# Patient Record
Sex: Female | Born: 1965 | Race: Black or African American | Hispanic: Yes | Marital: Married | Smoking: Former smoker
Health system: Southern US, Community
[De-identification: ages and names within clinical notes are randomized; demographics above are authoritative.]

## PROBLEM LIST (undated history)

## (undated) DIAGNOSIS — K589 Irritable bowel syndrome without diarrhea: Secondary | ICD-10-CM

## (undated) DIAGNOSIS — T753XXA Motion sickness, initial encounter: Secondary | ICD-10-CM

## (undated) DIAGNOSIS — M069 Rheumatoid arthritis, unspecified: Secondary | ICD-10-CM

## (undated) DIAGNOSIS — R011 Cardiac murmur, unspecified: Secondary | ICD-10-CM

## (undated) DIAGNOSIS — F329 Major depressive disorder, single episode, unspecified: Secondary | ICD-10-CM

## (undated) DIAGNOSIS — E669 Obesity, unspecified: Secondary | ICD-10-CM

## (undated) DIAGNOSIS — M5430 Sciatica, unspecified side: Secondary | ICD-10-CM

## (undated) DIAGNOSIS — G4700 Insomnia, unspecified: Secondary | ICD-10-CM

## (undated) DIAGNOSIS — T7840XA Allergy, unspecified, initial encounter: Secondary | ICD-10-CM

## (undated) DIAGNOSIS — I10 Essential (primary) hypertension: Secondary | ICD-10-CM

## (undated) DIAGNOSIS — E119 Type 2 diabetes mellitus without complications: Secondary | ICD-10-CM

## (undated) DIAGNOSIS — F319 Bipolar disorder, unspecified: Secondary | ICD-10-CM

## (undated) DIAGNOSIS — K759 Inflammatory liver disease, unspecified: Secondary | ICD-10-CM

## (undated) DIAGNOSIS — K219 Gastro-esophageal reflux disease without esophagitis: Secondary | ICD-10-CM

## (undated) DIAGNOSIS — K5790 Diverticulosis of intestine, part unspecified, without perforation or abscess without bleeding: Secondary | ICD-10-CM

## (undated) DIAGNOSIS — E785 Hyperlipidemia, unspecified: Secondary | ICD-10-CM

## (undated) DIAGNOSIS — F32A Depression, unspecified: Secondary | ICD-10-CM

## (undated) HISTORY — PX: BREAST BIOPSY: SHX20

## (undated) HISTORY — PX: EYE SURGERY: SHX253

## (undated) HISTORY — PX: COLON SURGERY: SHX602

## (undated) HISTORY — DX: Hyperlipidemia, unspecified: E78.5

## (undated) HISTORY — PX: TUBAL LIGATION: SHX77

## (undated) HISTORY — DX: Sciatica, unspecified side: M54.30

## (undated) HISTORY — DX: Allergy, unspecified, initial encounter: T78.40XA

## (undated) HISTORY — DX: Essential (primary) hypertension: I10

## (undated) HISTORY — PX: BOWEL RESECTION: SHX1257

## (undated) HISTORY — PX: ESOPHAGOGASTRODUODENOSCOPY (EGD) WITH ESOPHAGEAL DILATION: SHX5812

## (undated) HISTORY — DX: Diverticulosis of intestine, part unspecified, without perforation or abscess without bleeding: K57.90

## (undated) HISTORY — DX: Obesity, unspecified: E66.9

---

## 1995-12-10 HISTORY — PX: CHOLECYSTECTOMY: SHX55

## 2015-01-11 DIAGNOSIS — F17210 Nicotine dependence, cigarettes, uncomplicated: Secondary | ICD-10-CM | POA: Diagnosis not present

## 2015-01-11 DIAGNOSIS — M064 Inflammatory polyarthropathy: Secondary | ICD-10-CM | POA: Diagnosis not present

## 2015-01-11 DIAGNOSIS — Z79899 Other long term (current) drug therapy: Secondary | ICD-10-CM | POA: Diagnosis not present

## 2015-01-20 DIAGNOSIS — F250 Schizoaffective disorder, bipolar type: Secondary | ICD-10-CM | POA: Diagnosis not present

## 2015-02-09 DIAGNOSIS — K589 Irritable bowel syndrome without diarrhea: Secondary | ICD-10-CM | POA: Diagnosis not present

## 2015-02-09 DIAGNOSIS — K2980 Duodenitis without bleeding: Secondary | ICD-10-CM | POA: Diagnosis not present

## 2015-02-09 DIAGNOSIS — R1013 Epigastric pain: Secondary | ICD-10-CM | POA: Diagnosis not present

## 2015-02-09 DIAGNOSIS — K449 Diaphragmatic hernia without obstruction or gangrene: Secondary | ICD-10-CM | POA: Diagnosis not present

## 2015-02-09 DIAGNOSIS — K2970 Gastritis, unspecified, without bleeding: Secondary | ICD-10-CM | POA: Diagnosis not present

## 2015-02-09 DIAGNOSIS — K5909 Other constipation: Secondary | ICD-10-CM | POA: Diagnosis not present

## 2015-02-16 DIAGNOSIS — F250 Schizoaffective disorder, bipolar type: Secondary | ICD-10-CM | POA: Diagnosis not present

## 2015-02-27 DIAGNOSIS — F250 Schizoaffective disorder, bipolar type: Secondary | ICD-10-CM | POA: Diagnosis not present

## 2015-03-09 DIAGNOSIS — F250 Schizoaffective disorder, bipolar type: Secondary | ICD-10-CM | POA: Diagnosis not present

## 2015-03-17 DIAGNOSIS — K2970 Gastritis, unspecified, without bleeding: Secondary | ICD-10-CM | POA: Diagnosis not present

## 2015-03-17 DIAGNOSIS — K5909 Other constipation: Secondary | ICD-10-CM | POA: Diagnosis not present

## 2015-04-06 DIAGNOSIS — F250 Schizoaffective disorder, bipolar type: Secondary | ICD-10-CM | POA: Diagnosis not present

## 2015-05-07 ENCOUNTER — Encounter: Payer: Self-pay | Admitting: Emergency Medicine

## 2015-05-07 ENCOUNTER — Emergency Department: Payer: Medicare Other

## 2015-05-07 ENCOUNTER — Inpatient Hospital Stay
Admission: EM | Admit: 2015-05-07 | Discharge: 2015-05-10 | DRG: 392 | Disposition: A | Discharge status: Home / Self Care | Payer: Medicare Other | Attending: Surgery | Admitting: Surgery

## 2015-05-07 DIAGNOSIS — R109 Unspecified abdominal pain: Secondary | ICD-10-CM | POA: Diagnosis not present

## 2015-05-07 DIAGNOSIS — Z79899 Other long term (current) drug therapy: Secondary | ICD-10-CM

## 2015-05-07 DIAGNOSIS — K589 Irritable bowel syndrome without diarrhea: Secondary | ICD-10-CM | POA: Diagnosis present

## 2015-05-07 DIAGNOSIS — M069 Rheumatoid arthritis, unspecified: Secondary | ICD-10-CM | POA: Diagnosis present

## 2015-05-07 DIAGNOSIS — K5732 Diverticulitis of large intestine without perforation or abscess without bleeding: Secondary | ICD-10-CM | POA: Diagnosis not present

## 2015-05-07 DIAGNOSIS — F319 Bipolar disorder, unspecified: Secondary | ICD-10-CM | POA: Diagnosis present

## 2015-05-07 DIAGNOSIS — F17210 Nicotine dependence, cigarettes, uncomplicated: Secondary | ICD-10-CM | POA: Diagnosis present

## 2015-05-07 DIAGNOSIS — R1032 Left lower quadrant pain: Secondary | ICD-10-CM | POA: Diagnosis not present

## 2015-05-07 DIAGNOSIS — K760 Fatty (change of) liver, not elsewhere classified: Secondary | ICD-10-CM | POA: Diagnosis not present

## 2015-05-07 DIAGNOSIS — K219 Gastro-esophageal reflux disease without esophagitis: Secondary | ICD-10-CM | POA: Diagnosis present

## 2015-05-07 HISTORY — DX: Bipolar disorder, unspecified: F31.9

## 2015-05-07 HISTORY — DX: Irritable bowel syndrome, unspecified: K58.9

## 2015-05-07 HISTORY — DX: Gastro-esophageal reflux disease without esophagitis: K21.9

## 2015-05-07 HISTORY — DX: Rheumatoid arthritis, unspecified: M06.9

## 2015-05-07 HISTORY — DX: Insomnia, unspecified: G47.00

## 2015-05-07 LAB — CBC WITH DIFFERENTIAL/PLATELET
BASOS ABS: 0.1 10*3/uL (ref 0–0.1)
Basophils Relative: 1 %
Eosinophils Absolute: 0 10*3/uL (ref 0–0.7)
Eosinophils Relative: 1 %
HCT: 36.8 % (ref 35.0–47.0)
HEMOGLOBIN: 12.1 g/dL (ref 12.0–16.0)
LYMPHS ABS: 1.8 10*3/uL (ref 1.0–3.6)
LYMPHS PCT: 20 %
MCH: 29.5 pg (ref 26.0–34.0)
MCHC: 33 g/dL (ref 32.0–36.0)
MCV: 89.4 fL (ref 80.0–100.0)
MONO ABS: 0.6 10*3/uL (ref 0.2–0.9)
Monocytes Relative: 7 %
Neutro Abs: 6.4 10*3/uL (ref 1.4–6.5)
Neutrophils Relative %: 71 %
PLATELETS: 237 10*3/uL (ref 150–440)
RBC: 4.11 MIL/uL (ref 3.80–5.20)
RDW: 14.6 % — ABNORMAL HIGH (ref 11.5–14.5)
WBC: 8.8 10*3/uL (ref 3.6–11.0)

## 2015-05-07 LAB — COMPREHENSIVE METABOLIC PANEL
ALK PHOS: 97 U/L (ref 38–126)
ALT: 29 U/L (ref 14–54)
ANION GAP: 6 (ref 5–15)
AST: 20 U/L (ref 15–41)
Albumin: 3.8 g/dL (ref 3.5–5.0)
BUN: 12 mg/dL (ref 6–20)
CHLORIDE: 104 mmol/L (ref 101–111)
CO2: 26 mmol/L (ref 22–32)
Calcium: 9 mg/dL (ref 8.9–10.3)
Creatinine, Ser: 0.85 mg/dL (ref 0.44–1.00)
GFR calc non Af Amer: >60 mL/min (ref >60)
GLUCOSE: 144 mg/dL — AB (ref 65–99)
POTASSIUM: 3.6 mmol/L (ref 3.5–5.1)
SODIUM: 136 mmol/L (ref 135–145)
Total Bilirubin: 0.6 mg/dL (ref 0.3–1.2)
Total Protein: 7.5 g/dL (ref 6.5–8.1)

## 2015-05-07 LAB — URINALYSIS COMPLETE WITH MICROSCOPIC (ARMC ONLY)
BILIRUBIN URINE: NEGATIVE
GLUCOSE, UA: NEGATIVE mg/dL
Ketones, ur: NEGATIVE mg/dL
Leukocytes, UA: NEGATIVE
Nitrite: NEGATIVE
Protein, ur: NEGATIVE mg/dL
Specific Gravity, Urine: 1.018 (ref 1.005–1.030)
pH: 5 (ref 5.0–8.0)

## 2015-05-07 LAB — POCT PREGNANCY, URINE: Preg Test, Ur: NEGATIVE

## 2015-05-07 LAB — TROPONIN I

## 2015-05-07 LAB — LIPASE, BLOOD: Lipase: 29 U/L (ref 22–51)

## 2015-05-07 MED ORDER — METRONIDAZOLE IN NACL 5-0.79 MG/ML-% IV SOLN
INTRAVENOUS | Status: AC
  Administered 2015-05-07: 500 mg via INTRAVENOUS
  Filled 2015-05-07: qty 100

## 2015-05-07 MED ORDER — ONDANSETRON HCL 4 MG/2ML IJ SOLN
4.0000 mg | Freq: Once | INTRAMUSCULAR | Status: AC
  Administered 2015-05-07: 4 mg via INTRAVENOUS

## 2015-05-07 MED ORDER — CIPROFLOXACIN IN D5W 400 MG/200ML IV SOLN
INTRAVENOUS | Status: AC
  Administered 2015-05-07: 400 mg via INTRAVENOUS
  Filled 2015-05-07: qty 200

## 2015-05-07 MED ORDER — HYDROMORPHONE HCL 1 MG/ML IJ SOLN
INTRAMUSCULAR | Status: AC
  Administered 2015-05-07: 1 mg via INTRAVENOUS
  Filled 2015-05-07: qty 1

## 2015-05-07 MED ORDER — HYDROMORPHONE HCL 1 MG/ML IJ SOLN
1.0000 mg | Freq: Once | INTRAMUSCULAR | Status: AC
  Administered 2015-05-07: 1 mg via INTRAVENOUS

## 2015-05-07 MED ORDER — ALUM & MAG HYDROXIDE-SIMETH 200-200-20 MG/5ML PO SUSP: 30.0000 mL | Freq: Four times a day (QID) | ORAL | Status: DC | PRN

## 2015-05-07 MED ORDER — METRONIDAZOLE IN NACL 5-0.79 MG/ML-% IV SOLN
500.0000 mg | Freq: Once | INTRAVENOUS | Status: AC
  Administered 2015-05-07: 500 mg via INTRAVENOUS

## 2015-05-07 MED ORDER — ALUM & MAG HYDROXIDE-SIMETH 200-200-20 MG/5ML PO SUSP: 30.0000 mL | ORAL | Status: DC | PRN

## 2015-05-07 MED ORDER — ONDANSETRON HCL 4 MG PO TABS: 4.0000 mg | ORAL_TABLET | Freq: Four times a day (QID) | ORAL | Status: DC | PRN

## 2015-05-07 MED ORDER — ACETAMINOPHEN 325 MG PO TABS: 650.0000 mg | ORAL_TABLET | Freq: Four times a day (QID) | ORAL | Status: DC | PRN

## 2015-05-07 MED ORDER — ACETAMINOPHEN 650 MG RE SUPP: 650.0000 mg | Freq: Four times a day (QID) | RECTAL | Status: DC | PRN

## 2015-05-07 MED ORDER — SIMETHICONE 80 MG PO CHEW: 80.0000 mg | CHEWABLE_TABLET | Freq: Four times a day (QID) | ORAL | Status: DC | PRN

## 2015-05-07 MED ORDER — ONDANSETRON HCL 4 MG/2ML IJ SOLN
4.0000 mg | Freq: Four times a day (QID) | INTRAMUSCULAR | Status: DC | PRN
  Administered 2015-05-07 – 2015-05-10 (×3): 4 mg via INTRAVENOUS
  Filled 2015-05-07 (×3): qty 2

## 2015-05-07 MED ORDER — FAMOTIDINE 20 MG PO TABS
20.0000 mg | ORAL_TABLET | Freq: Every day | ORAL | Status: DC
  Administered 2015-05-07 – 2015-05-10 (×4): 20 mg via ORAL
  Filled 2015-05-07 (×4): qty 1

## 2015-05-07 MED ORDER — HEPARIN SODIUM (PORCINE) 5000 UNIT/ML IJ SOLN
5000.0000 [IU] | Freq: Three times a day (TID) | INTRAMUSCULAR | Status: DC
  Administered 2015-05-07 – 2015-05-10 (×9): 5000 [IU] via SUBCUTANEOUS
  Filled 2015-05-07 (×9): qty 1

## 2015-05-07 MED ORDER — IOHEXOL 350 MG/ML SOLN
100.0000 mL | Freq: Once | INTRAVENOUS | Status: AC | PRN
  Administered 2015-05-07: 100 mL via INTRAVENOUS

## 2015-05-07 MED ORDER — METRONIDAZOLE IN NACL 5-0.79 MG/ML-% IV SOLN
500.0000 mg | Freq: Three times a day (TID) | INTRAVENOUS | Status: DC
  Administered 2015-05-07 – 2015-05-09 (×5): 500 mg via INTRAVENOUS
  Filled 2015-05-07 (×9): qty 100

## 2015-05-07 MED ORDER — IOHEXOL 240 MG/ML SOLN
25.0000 mL | Freq: Once | INTRAMUSCULAR | Status: AC | PRN
  Administered 2015-05-07: 25 mL via ORAL

## 2015-05-07 MED ORDER — SODIUM CHLORIDE 0.9 % IV BOLUS (SEPSIS)
1000.0000 mL | Freq: Once | INTRAVENOUS | Status: AC
  Administered 2015-05-07: 1000 mL via INTRAVENOUS

## 2015-05-07 MED ORDER — ONDANSETRON HCL 4 MG/2ML IJ SOLN
INTRAMUSCULAR | Status: AC
  Administered 2015-05-07: 4 mg via INTRAVENOUS
  Filled 2015-05-07: qty 2

## 2015-05-07 MED ORDER — MORPHINE SULFATE 4 MG/ML IJ SOLN
INTRAMUSCULAR | Status: AC
  Administered 2015-05-07: 4 mg via INTRAVENOUS
  Filled 2015-05-07: qty 1

## 2015-05-07 MED ORDER — KETOROLAC TROMETHAMINE 30 MG/ML IJ SOLN
INTRAMUSCULAR | Status: AC
  Administered 2015-05-07: 30 mg via INTRAVENOUS
  Filled 2015-05-07: qty 1

## 2015-05-07 MED ORDER — HYDROCODONE-ACETAMINOPHEN 5-325 MG PO TABS
1.0000 | ORAL_TABLET | ORAL | Status: DC | PRN
  Administered 2015-05-09: 1 via ORAL
  Administered 2015-05-09: 2 via ORAL
  Filled 2015-05-07: qty 2
  Filled 2015-05-07: qty 1

## 2015-05-07 MED ORDER — CIPROFLOXACIN IN D5W 400 MG/200ML IV SOLN
400.0000 mg | Freq: Once | INTRAVENOUS | Status: AC
  Administered 2015-05-07: 400 mg via INTRAVENOUS

## 2015-05-07 MED ORDER — KETOROLAC TROMETHAMINE 30 MG/ML IJ SOLN
30.0000 mg | Freq: Once | INTRAMUSCULAR | Status: AC
  Administered 2015-05-07: 30 mg via INTRAVENOUS

## 2015-05-07 MED ORDER — MORPHINE SULFATE 4 MG/ML IJ SOLN
4.0000 mg | Freq: Once | INTRAMUSCULAR | Status: AC
  Administered 2015-05-07: 4 mg via INTRAVENOUS

## 2015-05-07 MED ORDER — KCL IN DEXTROSE-NACL 20-5-0.45 MEQ/L-%-% IV SOLN
INTRAVENOUS | Status: DC
  Administered 2015-05-07 – 2015-05-10 (×6): via INTRAVENOUS
  Filled 2015-05-07 (×11): qty 1000

## 2015-05-07 MED ORDER — CIPROFLOXACIN IN D5W 400 MG/200ML IV SOLN
400.0000 mg | Freq: Two times a day (BID) | INTRAVENOUS | Status: DC
Start: 2015-05-07 — End: 2015-05-09
  Administered 2015-05-07 – 2015-05-08 (×3): 400 mg via INTRAVENOUS
  Filled 2015-05-07 (×6): qty 200

## 2015-05-07 MED ORDER — MORPHINE SULFATE 2 MG/ML IJ SOLN
2.0000 mg | INTRAMUSCULAR | Status: DC | PRN
  Administered 2015-05-07 – 2015-05-09 (×13): 2 mg via INTRAVENOUS
  Filled 2015-05-07 (×14): qty 1

## 2015-05-07 NOTE — ED Notes (Signed)
Pt reports left back and side pain all week. Increased pain with movement. Pt also endorses nausea and headache.

## 2015-05-07 NOTE — H&P (Signed)
Elizabeth [name] is an 49 y.o. female.     Chief Complaint: Left flank and back pain, mild nausea.  HPI:     49 y/o female with hx IBS and GERD presents to ER with about 1 week of progressive, worsening and now constant left sided abd pain and left flank pain, no dysuria, no fevers, no sick contacts.  She sees regularly a gastroenterologist in [place] who called her in some different acid reflux medications however her insurance did not cover this medication.  He is seen today in the emergency room following a workup demonstrating a normal white count, normal urinalysis. A contrasted CT scan was personally reviewed demonstrating a microperforation of mid descending colon diverticular disease. There is no abscess seen. There is no free fluid. There is no free air except that surrounding the colon.  There has been little alleviating factors. He is seen today in the emergency room with her daughter.   Past Medical History  Diagnosis Date  . Irritable bowel syndrome   . RA (rheumatoid arthritis)   . Bipolar 1 disorder   . Insomnia   . GERD (gastroesophageal reflux disease)     Past Surgical History  Procedure Laterality Date  . Cholecystectomy    . Tubal ligation      Family history significant for hypertension and gallstones.  Social History:  reports that she has been smoking Cigarettes.  She has been smoking about 0.50 packs per day. She does not have any smokeless tobacco history on file. She reports that she does not drink alcohol or use illicit drugs.  Allergies:  Allergies  Allergen Reactions  . Penicillins Hives     Review of Systems:   Review of Systems  Constitutional: Positive for malaise/fatigue. Negative for fever, chills, weight loss and diaphoresis.  HENT: Negative.   Eyes: Negative.   Respiratory: Negative for cough and shortness of breath.   Cardiovascular: Negative for chest pain and palpitations.  Gastrointestinal: Positive for heartburn, nausea and  abdominal pain. Negative for diarrhea, constipation, blood in stool and melena.  Genitourinary: Negative.   Skin: Negative.   Neurological: Negative for weakness.  Psychiatric/Behavioral: Negative.   All other systems reviewed and are negative.   Physical Exam:  Physical Exam  Constitutional: She is oriented to person, place, and time and well-developed, well-nourished, and in no distress. No distress.  HENT:  Head: Normocephalic and atraumatic.  Eyes: Conjunctivae are normal. Pupils are equal, round, and reactive to light.  Neck: Normal range of motion. Neck supple. No tracheal deviation present. No thyromegaly present.  Cardiovascular: Normal rate and regular rhythm.   Pulmonary/Chest: Effort normal and breath sounds normal. No respiratory distress. She has no wheezes.  Abdominal: Soft. Normal appearance and bowel sounds are normal. She exhibits distension. She exhibits no shifting dullness and no pulsatile liver. There is no hepatosplenomegaly, splenomegaly or hepatomegaly. There is generalized tenderness and tenderness in the left upper quadrant. No hernia.    Musculoskeletal: Normal range of motion.  Neurological: She is alert and oriented to person, place, and time.  Skin: Skin is warm and dry.  Psychiatric: Mood, memory, affect and judgment normal.    Blood pressure 114/71, pulse 86, temperature 98.5 F (36.9 C), temperature source Oral, resp. rate 17, height '5\' 6"'  (1.676 m), weight 106.595 kg (235 lb), SpO2 95 %.    Results for orders placed or performed during the hospital encounter of 05/07/15 (from the past 48 hour(s))  Troponin I     Status: None  Collection Time: 05/07/15  7:41 AM  Result Value Ref Range   Troponin I <0.03 <0.031 ng/mL    Comment:        NO INDICATION OF MYOCARDIAL INJURY.   CBC with Differential     Status: Abnormal   Collection Time: 05/07/15  8:15 AM  Result Value Ref Range   WBC 8.8 3.6 - 11.0 K/uL   RBC 4.11 3.80 - 5.20 MIL/uL    Hemoglobin 12.1 12.0 - 16.0 g/dL   HCT 36.8 35.0 - 47.0 %   MCV 89.4 80.0 - 100.0 fL   MCH 29.5 26.0 - 34.0 pg   MCHC 33.0 32.0 - 36.0 g/dL   RDW 14.6 (H) 11.5 - 14.5 %   Platelets 237 150 - 440 K/uL   Neutrophils Relative % 71 %   Neutro Abs 6.4 1.4 - 6.5 K/uL   Lymphocytes Relative 20 %   Lymphs Abs 1.8 1.0 - 3.6 K/uL   Monocytes Relative 7 %   Monocytes Absolute 0.6 0.2 - 0.9 K/uL   Eosinophils Relative 1 %   Eosinophils Absolute 0.0 0 - 0.7 K/uL   Basophils Relative 1 %   Basophils Absolute 0.1 0 - 0.1 K/uL  Comprehensive metabolic panel     Status: Abnormal   Collection Time: 05/07/15  8:15 AM  Result Value Ref Range   Sodium 136 135 - 145 mmol/L   Potassium 3.6 3.5 - 5.1 mmol/L   Chloride 104 101 - 111 mmol/L   CO2 26 22 - 32 mmol/L   Glucose, Bld 144 (H) 65 - 99 mg/dL   BUN 12 6 - 20 mg/dL   Creatinine, Ser 0.85 0.44 - 1.00 mg/dL   Calcium 9.0 8.9 - 10.3 mg/dL   Total Protein 7.5 6.5 - 8.1 g/dL   Albumin 3.8 3.5 - 5.0 g/dL   AST 20 15 - 41 U/L   ALT 29 14 - 54 U/L   Alkaline Phosphatase 97 38 - 126 U/L   Total Bilirubin 0.6 0.3 - 1.2 mg/dL   GFR calc non Af Amer >60 >60 mL/min   GFR calc Af Amer >60 >60 mL/min    Comment: (NOTE) The eGFR has been calculated using the CKD EPI equation. This calculation has not been validated in all clinical situations. eGFR's persistently <60 mL/min signify possible Chronic Kidney Disease.    Anion gap 6 5 - 15  Urinalysis complete, with microscopic [facility])     Status: Abnormal   Collection Time: 05/07/15  8:15 AM  Result Value Ref Range   Color, Urine YELLOW (A) YELLOW   APPearance CLEAR (A) CLEAR   Glucose, UA NEGATIVE NEGATIVE mg/dL   Bilirubin Urine NEGATIVE NEGATIVE   Ketones, ur NEGATIVE NEGATIVE mg/dL   Specific Gravity, Urine 1.018 1.005 - 1.030   Hgb urine dipstick 1+ (A) NEGATIVE   pH 5.0 5.0 - 8.0   Protein, ur NEGATIVE NEGATIVE mg/dL   Nitrite NEGATIVE NEGATIVE   Leukocytes, UA NEGATIVE NEGATIVE   RBC / HPF  0-5 0 - 5 RBC/hpf   WBC, UA 0-5 0 - 5 WBC/hpf   Bacteria, UA RARE (A) NONE SEEN   Squamous Epithelial / LPF 0-5 (A) NONE SEEN   Mucous PRESENT   Lipase, blood     Status: None   Collection Time: 05/07/15  8:15 AM  Result Value Ref Range   Lipase 29 22 - 51 U/L  Pregnancy, urine POC     Status: None   Collection Time: 05/07/15  8:19 AM  Result Value Ref Range   Preg Test, Ur NEGATIVE NEGATIVE    Comment:        THE SENSITIVITY OF THIS METHODOLOGY IS >24 mIU/mL    Ct Abdomen Pelvis W Contrast  05/07/2015   CLINICAL DATA:  Pt with abd/pelvic pain that started last week. Pt also states she has had nausea and a bad headache ever since. Pt states that she is unable to eat, when she does try and eat the pain is worse. Pain is mainly on Left side. Pt denies hx of ca, pt can not recall if she had gallstones removed or if she had her gallbladder removed.  EXAM: CT ABDOMEN AND PELVIS WITH CONTRAST  TECHNIQUE: Multidetector CT imaging of the abdomen and pelvis was performed using the standard protocol following bolus administration of intravenous contrast.  CONTRAST:  150m OMNIPAQUE IOHEXOL 350 MG/ML SOLN  COMPARISON:  None.  FINDINGS: Lung bases: Minor atelectasis or scarring at the base of the right middle lobe. Heart normal in size. Small hiatal hernia.  Liver: Mild fatty infiltration.  No mass or lesion.  Gallbladder and biliary tree: Gallbladder surgically absent. No bile duct dilation.  Spleen, pancreas, adrenal glands, kidneys, ureters bladder: Unremarkable.  Uterus and adnexa:  Normal.  Lymph nodes:  No adenopathy.  Ascites:  None.  GI tract: There are inflammatory changes adjacent to the mid descending colon associated with several diverticula. There is a small focus of extraluminal air adjacent to a diverticulum. No evidence of free intraperitoneal air. No abscess. Other diverticula noted along the left colon with no other evidence of inflammation. Small bowel is unremarkable.  Appendix: Prominent,  but is gas-filled with a thin wall. No evidence of appendicitis.  Musculoskeletal:  Unremarkable.  IMPRESSION: 1. Acute diverticulitis, affecting the mid descending colon. Small bubble of extraluminal air adjacent to an inflamed diverticulum indicating a contained minimal perforation. No free air. No evidence of an abscess. 2. No other acute finding. 3. Fatty infiltration of the liver.  Left colon diverticulosis.   Electronically Signed   By: DLajean ManesM.D.   On: 05/07/2015 10:16   I personally reviewed the images on the PACS monitor.  Discuss his case personally with emergency room M.D. Dr. GEdd Fabian  Assessment/Plan 49year old female with history of IBS and gastroesophageal reflux disease presents with 5-6 days of abdominal pain concerning examination and radiographic findings insistent with microperforation mid descending colon diverticulitis. At present I see no indication for operative intervention.  Plan the patient will be admitted made nothing by mouth intravenous antibiotics bowel rest and serial abdominal examinations. He understands the plan and wishes to proceed with this course of treatment. She will eventually need an outpatient colonoscopy.  Time spent 60 minutes.  MHortencia Conradi MD, FACS

## 2015-05-07 NOTE — ED Provider Notes (Signed)
[facility] Emergency Department Provider Note  ____________________________________________  Time seen: Approximately 7:23 AM  I have reviewed the triage vital signs and the nursing notes.   HISTORY  Chief Complaint Flank Pain    HPI Elizabeth [name] is a 49 y.o. female history of irritable bowel bowel syndrome, rheumatoid arthritis, bipolar 1 disorder, GERD who presents with one week of worsening intermittent cramping left flank and abdominal pain. She reports the pain comes and goes in waves. Current severity is 10 out of 10. Movement makes the pain worse. She has had nausea without vomiting. She has had diarrhea but reports that that is chronic for her. She has not noted any blood in her stool. She reports chills last night. No chest pain or difficulty breathing. She has had mild headaches as well. No dysuria or increased urinary frequency.   Past Medical History  Diagnosis Date  . Irritable bowel syndrome   . RA (rheumatoid arthritis)   . Bipolar 1 disorder   . Insomnia   . GERD (gastroesophageal reflux disease)     Patient Active Problem List   Diagnosis Date Noted  . Diverticulitis large intestine 05/07/2015    Past Surgical History  Procedure Laterality Date  . Cholecystectomy    . Tubal ligation      No current outpatient prescriptions on file.  Allergies Penicillins  History reviewed. No pertinent family history.  Social History History  Substance Use Topics  . Smoking status: Current Some Day Smoker -- 0.50 packs/day    Types: Cigarettes  . Smokeless tobacco: Not on file  . Alcohol Use: No    Review of Systems Constitutional: No fever; +chills Eyes: No visual changes. ENT: No sore throat. Cardiovascular: Denies chest pain. Respiratory: Denies shortness of breath. Gastrointestinal: + abdominal pain.  + nausea, no vomiting.  + diarrhea.  No constipation. Genitourinary: Negative for dysuria. Musculoskeletal: + for back  pain. Skin: Negative for rash. Neurological: + for headaches, negative for focal weakness or numbness.  10-point ROS otherwise negative.  ____________________________________________   PHYSICAL EXAM:  VITAL SIGNS: ED Triage Vitals  Enc Vitals Group     BP 05/07/15 0705 148/89 mmHg     Pulse Rate 05/07/15 0705 106     Resp 05/07/15 0705 20     Temp 05/07/15 0705 98.5 F (36.9 C)     Temp Source 05/07/15 0705 Oral     SpO2 05/07/15 0705 96 %     Weight 05/07/15 0705 235 lb (106.595 kg)     Height 05/07/15 0705 5\' 6"  (1.676 m)     Head Cir --      Peak Flow --      Pain Score 05/07/15 0706 10     Pain Loc --      Pain Edu? --      Excl. in [place]? --     Constitutional: Alert and oriented. Tearful and in mild distress secondary to pain. Eyes: Conjunctivae are normal. PERRL. EOMI. Head: Atraumatic. Nose: No congestion/rhinnorhea. Mouth/Throat: Mucous membranes are moist.  Oropharynx non-erythematous. Neck: No stridor.   Cardiovascular: mildly tachycardic rate, regular rhythm. Grossly normal heart sounds.  Good peripheral circulation. Respiratory: Normal respiratory effort.  No retractions. Lungs CTAB. Gastrointestinal: Normal bowel sounds, no distention, moderate left abdominal tenderness. Genitourinary: deferred Musculoskeletal: No lower extremity tenderness nor edema.  No joint effusions. Severe tenderness to palpation throughout the left paravertebral muscles of the lumbar spine without any midline tenderness Neurologic:  Normal speech and language. No  gross focal neurologic deficits are appreciated. Speech is normal. No gait instability. Skin:  Skin is warm, dry and intact. No rash noted. Psychiatric: Mood and affect are normal. Speech and behavior are normal.  ____________________________________________   LABS (all labs ordered are listed, but only abnormal results are displayed)  Labs Reviewed  CBC WITH DIFFERENTIAL/PLATELET - Abnormal; Notable for the following:     RDW 14.6 (*)    All other components within normal limits  COMPREHENSIVE METABOLIC PANEL - Abnormal; Notable for the following:    Glucose, Bld 144 (*)    All other components within normal limits  URINALYSIS COMPLETEWITH MICROSCOPIC ([facility] ONLY) - Abnormal; Notable for the following:    Color, Urine YELLOW (*)    APPearance CLEAR (*)    Hgb urine dipstick 1+ (*)    Bacteria, UA RARE (*)    Squamous Epithelial / LPF 0-5 (*)    All other components within normal limits  CULTURE, BLOOD (ROUTINE X 2)  CULTURE, BLOOD (ROUTINE X 2)  LIPASE, BLOOD  TROPONIN I  POC URINE PREG, ED  POCT PREGNANCY, URINE   ____________________________________________  EKG  ED ECG REPORT I, Joanne Gavel, the attending physician, personally viewed and interpreted this ECG.   Date: 05/07/2015  EKG Time: 07:57  Rate: 100  Rhythm: normal sinus rhythm  Axis: Normal axis  Intervals:none, normal intervals  ST&T Change: No acute ST segment elevation  ____________________________________________  RADIOLOGY  CT A/P FINDINGS: Lung bases: Minor atelectasis or scarring at the base of the right middle lobe. Heart normal in size. Small hiatal hernia.  Liver: Mild fatty infiltration. No mass or lesion.  Gallbladder and biliary tree: Gallbladder surgically absent. No bile duct dilation.  Spleen, pancreas, adrenal glands, kidneys, ureters bladder: Unremarkable.  Uterus and adnexa: Normal.  Lymph nodes: No adenopathy.  Ascites: None.  GI tract: There are inflammatory changes adjacent to the mid descending colon associated with several diverticula. There is a small focus of extraluminal air adjacent to a diverticulum. No evidence of free intraperitoneal air. No abscess. Other diverticula noted along the left colon with no other evidence of inflammation. Small bowel is unremarkable.  Appendix: Prominent, but is gas-filled with a thin wall. No evidence of appendicitis.  Musculoskeletal:  Unremarkable.  IMPRESSION: 1. Acute diverticulitis, affecting the mid descending colon. Small bubble of extraluminal air adjacent to an inflamed diverticulum indicating a contained minimal perforation. No free air. No evidence of an abscess. 2. No other acute finding. 3. Fatty infiltration of the liver. Left colon diverticulosis. ____________________________________________   PROCEDURES  Procedure(s) performed: None  Critical Care performed: No  ____________________________________________   INITIAL IMPRESSION / ASSESSMENT AND PLAN / ED COURSE  Pertinent labs & imaging results that were available during my care of the patient were reviewed by me and considered in my medical decision making (see chart for details).  Elizabeth [name] is a 49 y.o. female history of irritable bowel bowel syndrome, rheumatoid arthritis, bipolar 1 disorder, GERD who presents with one week of worsening intermittent cramping left flank and abdominal pain. On exam, she is in distress secondary to pain. The pain in her back and abdomen is worse with movement as well as palpation. She is afebrile but mildly tachycardic. Plan for screening EKG, labs, urinalysis as well as likely CT of the abdomen and pelvis to further evaluate cause of her abdominal/flank pain. We'll provide pain control, antiemetics, IV fluids.  ----------------------------------------- 10:23 AM on 05/07/2015 -----------------------------------------  CT scan shows diverticulitis with possible  minimal contained perforation. Discussed with Dr. Marina Gravel of Gen. surgery who will admit. We'll give Cipro and Flagyl. ____________________________________________   FINAL CLINICAL IMPRESSION(S) / ED DIAGNOSES  Final diagnoses:  Acute left flank pain      Joanne Gavel, MD 05/07/15 1423

## 2015-05-08 LAB — COMPREHENSIVE METABOLIC PANEL
ALBUMIN: 3.1 g/dL — AB (ref 3.5–5.0)
ALT: 53 U/L (ref 14–54)
AST: 29 U/L (ref 15–41)
Alkaline Phosphatase: 95 U/L (ref 38–126)
Anion gap: 4 — ABNORMAL LOW (ref 5–15)
BUN: 8 mg/dL (ref 6–20)
CALCIUM: 8.6 mg/dL — AB (ref 8.9–10.3)
CO2: 28 mmol/L (ref 22–32)
CREATININE: 0.81 mg/dL (ref 0.44–1.00)
Chloride: 106 mmol/L (ref 101–111)
GFR calc non Af Amer: >60 mL/min (ref >60)
Glucose, Bld: 162 mg/dL — ABNORMAL HIGH (ref 65–99)
POTASSIUM: 3.6 mmol/L (ref 3.5–5.1)
SODIUM: 138 mmol/L (ref 135–145)
TOTAL PROTEIN: 6.3 g/dL — AB (ref 6.5–8.1)
Total Bilirubin: 0.8 mg/dL (ref 0.3–1.2)

## 2015-05-08 MED ORDER — FOLIC ACID 1 MG PO TABS
1.0000 mg | ORAL_TABLET | Freq: Every day | ORAL | Status: DC
  Administered 2015-05-08 – 2015-05-10 (×3): 1 mg via ORAL
  Filled 2015-05-08 (×3): qty 1

## 2015-05-08 MED ORDER — OLANZAPINE 10 MG PO TABS
10.0000 mg | ORAL_TABLET | Freq: Every day | ORAL | Status: DC
  Filled 2015-05-08: qty 1

## 2015-05-08 MED ORDER — METHOTREXATE 2.5 MG PO TABS
15.0000 mg | ORAL_TABLET | ORAL | Status: DC
  Administered 2015-05-08: 15 mg via ORAL
  Filled 2015-05-08 (×2): qty 6

## 2015-05-08 MED ORDER — METHOTREXATE 2.5 MG PO TABS: 15.0000 mg | ORAL_TABLET | ORAL | Status: DC

## 2015-05-08 MED ORDER — TRAZODONE HCL 100 MG PO TABS
300.0000 mg | ORAL_TABLET | Freq: Every day | ORAL | Status: DC
Start: 2015-05-08 — End: 2015-05-10
  Administered 2015-05-09: 300 mg via ORAL
  Filled 2015-05-08 (×2): qty 3

## 2015-05-08 NOTE — Progress Notes (Signed)
Subjective:   Still with marked left flank left lower quadrant abdominal pain. No nausea or vomiting. She denies any fever or chills.   Vital signs in last 24 hours: Temp:  [98.3 F (36.8 C)-99.1 F (37.3 C)] 98.5 F (36.9 C) (05/30 0813) Pulse Rate:  [84-96] 87 (05/30 0813) Resp:  [17-18] 17 (05/30 0813) BP: (94-125)/(56-75) 125/75 mmHg (05/30 0813) SpO2:  [92 %-98 %] 98 % (05/30 0813) Weight:  [108.7 kg (239 lb 10.2 oz)] 108.7 kg (239 lb 10.2 oz) (05/30 0500) Last BM Date: 05/07/15  Intake/Output from previous day: 05/29 0701 - 05/30 0700 In: 1888.6 [I.V.:1888.6] Out: 8099 [Urine:1675]  Exam Abdomen is moderately tender in the left side with some guarding but no rebound. She has active bowel sounds. No hernias or masses are noted.   Lab Results:  CBC  Recent Labs  05/07/15 0815  WBC 8.8  HGB 12.1  HCT 36.8  PLT 237   CMP     Component Value Date/Time   NA 138 05/08/2015 0516   K 3.6 05/08/2015 0516   CL 106 05/08/2015 0516   CO2 28 05/08/2015 0516   GLUCOSE 162* 05/08/2015 0516   BUN 8 05/08/2015 0516   CREATININE 0.81 05/08/2015 0516   CALCIUM 8.6* 05/08/2015 0516   PROT 6.3* 05/08/2015 0516   ALBUMIN 3.1* 05/08/2015 0516   AST 29 05/08/2015 0516   ALT 53 05/08/2015 0516   ALKPHOS 95 05/08/2015 0516   BILITOT 0.8 05/08/2015 0516   GFRNONAA >60 05/08/2015 0516   GFRAA >60 05/08/2015 0516   PT/INR No results for input(s): LABPROT, INR in the last 72 hours.  Studies/Results: Ct Abdomen Pelvis W Contrast  05/07/2015   CLINICAL DATA:  Pt with abd/pelvic pain that started last week. Pt also states she has had nausea and a bad headache ever since. Pt states that she is unable to eat, when she does try and eat the pain is worse. Pain is mainly on Left side. Pt denies hx of ca, pt can not recall if she had gallstones removed or if she had her gallbladder removed.  EXAM: CT ABDOMEN AND PELVIS WITH CONTRAST  TECHNIQUE: Multidetector CT imaging of the abdomen and  pelvis was performed using the standard protocol following bolus administration of intravenous contrast.  CONTRAST:  171mL OMNIPAQUE IOHEXOL 350 MG/ML SOLN  COMPARISON:  None.  FINDINGS: Lung bases: Minor atelectasis or scarring at the base of the right middle lobe. Heart normal in size. Small hiatal hernia.  Liver: Mild fatty infiltration.  No mass or lesion.  Gallbladder and biliary tree: Gallbladder surgically absent. No bile duct dilation.  Spleen, pancreas, adrenal glands, kidneys, ureters bladder: Unremarkable.  Uterus and adnexa:  Normal.  Lymph nodes:  No adenopathy.  Ascites:  None.  GI tract: There are inflammatory changes adjacent to the mid descending colon associated with several diverticula. There is a small focus of extraluminal air adjacent to a diverticulum. No evidence of free intraperitoneal air. No abscess. Other diverticula noted along the left colon with no other evidence of inflammation. Small bowel is unremarkable.  Appendix: Prominent, but is gas-filled with a thin wall. No evidence of appendicitis.  Musculoskeletal:  Unremarkable.  IMPRESSION: 1. Acute diverticulitis, affecting the mid descending colon. Small bubble of extraluminal air adjacent to an inflamed diverticulum indicating a contained minimal perforation. No free air. No evidence of an abscess. 2. No other acute finding. 3. Fatty infiltration of the liver.  Left colon diverticulosis.   Electronically Signed  By: Lajean Manes M.D.   On: 05/07/2015 10:16    Assessment/Plan: We will continue the current therapy. She is on intravenous antibiotics. Our plan would be to advance her diet converted to by mouth antibiotics consider repeat CT scan versus colonoscopy as outpatient. He is in agreement.

## 2015-05-09 MED ORDER — METRONIDAZOLE 500 MG PO TABS
500.0000 mg | ORAL_TABLET | Freq: Three times a day (TID) | ORAL | Status: DC
  Administered 2015-05-09 – 2015-05-10 (×4): 500 mg via ORAL
  Filled 2015-05-09 (×4): qty 1

## 2015-05-09 MED ORDER — CIPROFLOXACIN HCL 500 MG PO TABS
500.0000 mg | ORAL_TABLET | Freq: Two times a day (BID) | ORAL | Status: DC
  Administered 2015-05-09 – 2015-05-10 (×3): 500 mg via ORAL
  Filled 2015-05-09 (×3): qty 1

## 2015-05-09 NOTE — Progress Notes (Signed)
Subjective: She is feeling better this morning with less abdominal discomfort and minimal nausea. She denies any fever or chills. She would like to try some more aggressive diet.  Vital signs in last 24 hours: Temp:  [98.3 F (36.8 C)-98.8 F (37.1 C)] 98.3 F (36.8 C) (05/31 0758) Pulse Rate:  [81-93] 81 (05/31 0758) Resp:  [16-17] 17 (05/31 0109) BP: (111-137)/(67-73) 111/67 mmHg (05/31 0758) SpO2:  [97 %-99 %] 99 % (05/31 0758) Weight:  [108.7 kg (239 lb 10.2 oz)] 108.7 kg (239 lb 10.2 oz) (05/31 0452) Last BM Date: 05/07/15  Intake/Output from previous day: 05/30 0701 - 05/31 0700 In: 3273 [P.O.:600; I.V.:2473; IV Piggyback:200] Out: 1900 [Urine:1900]  Exam:  Her abdomen is soft with some moderate tenderness in her right flank. She has no rebound or guarding. She has active bowel sounds.  Lab Results:  CBC  Recent Labs  05/07/15 0815  WBC 8.8  HGB 12.1  HCT 36.8  PLT 237   CMP     Component Value Date/Time   NA 138 05/08/2015 0516   K 3.6 05/08/2015 0516   CL 106 05/08/2015 0516   CO2 28 05/08/2015 0516   GLUCOSE 162* 05/08/2015 0516   BUN 8 05/08/2015 0516   CREATININE 0.81 05/08/2015 0516   CALCIUM 8.6* 05/08/2015 0516   PROT 6.3* 05/08/2015 0516   ALBUMIN 3.1* 05/08/2015 0516   AST 29 05/08/2015 0516   ALT 53 05/08/2015 0516   ALKPHOS 95 05/08/2015 0516   BILITOT 0.8 05/08/2015 0516   GFRNONAA >60 05/08/2015 0516   GFRAA >60 05/08/2015 0516   PT/INR No results for input(s): LABPROT, INR in the last 72 hours.  Studies/Results: Ct Abdomen Pelvis W Contrast  05/07/2015   CLINICAL DATA:  Pt with abd/pelvic pain that started last week. Pt also states she has had nausea and a bad headache ever since. Pt states that she is unable to eat, when she does try and eat the pain is worse. Pain is mainly on Left side. Pt denies hx of ca, pt can not recall if she had gallstones removed or if she had her gallbladder removed.  EXAM: CT ABDOMEN AND PELVIS WITH CONTRAST   TECHNIQUE: Multidetector CT imaging of the abdomen and pelvis was performed using the standard protocol following bolus administration of intravenous contrast.  CONTRAST:  128mL OMNIPAQUE IOHEXOL 350 MG/ML SOLN  COMPARISON:  None.  FINDINGS: Lung bases: Minor atelectasis or scarring at the base of the right middle lobe. Heart normal in size. Small hiatal hernia.  Liver: Mild fatty infiltration.  No mass or lesion.  Gallbladder and biliary tree: Gallbladder surgically absent. No bile duct dilation.  Spleen, pancreas, adrenal glands, kidneys, ureters bladder: Unremarkable.  Uterus and adnexa:  Normal.  Lymph nodes:  No adenopathy.  Ascites:  None.  GI tract: There are inflammatory changes adjacent to the mid descending colon associated with several diverticula. There is a small focus of extraluminal air adjacent to a diverticulum. No evidence of free intraperitoneal air. No abscess. Other diverticula noted along the left colon with no other evidence of inflammation. Small bowel is unremarkable.  Appendix: Prominent, but is gas-filled with a thin wall. No evidence of appendicitis.  Musculoskeletal:  Unremarkable.  IMPRESSION: 1. Acute diverticulitis, affecting the mid descending colon. Small bubble of extraluminal air adjacent to an inflamed diverticulum indicating a contained minimal perforation. No free air. No evidence of an abscess. 2. No other acute finding. 3. Fatty infiltration of the liver.  Left colon  diverticulosis.   Electronically Signed   By: Lajean Manes M.D.   On: 05/07/2015 10:16    Assessment/Plan: Overall she is improves. We will switch from antibiotic therapy by intravenous route today to by mouth antibiotics. We will advance her diet and activity discontinue her IV fluid. We will check on her later today with the plan of hopefully discharging her tomorrow. She is in agreement.

## 2015-05-10 DIAGNOSIS — K5732 Diverticulitis of large intestine without perforation or abscess without bleeding: Secondary | ICD-10-CM | POA: Diagnosis not present

## 2015-05-10 MED ORDER — HYDROCODONE-ACETAMINOPHEN 5-325 MG PO TABS: 1.0000 | ORAL_TABLET | Freq: Four times a day (QID) | ORAL | Status: DC | PRN

## 2015-05-10 MED ORDER — ONDANSETRON HCL 4 MG PO TABS: 4.0000 mg | ORAL_TABLET | Freq: Four times a day (QID) | ORAL | Status: DC | PRN

## 2015-05-10 MED ORDER — METRONIDAZOLE 500 MG PO TABS: 500.0000 mg | ORAL_TABLET | Freq: Three times a day (TID) | ORAL | Status: DC

## 2015-05-10 MED ORDER — CIPROFLOXACIN HCL 500 MG PO TABS: 500.0000 mg | ORAL_TABLET | Freq: Two times a day (BID) | ORAL | Status: DC

## 2015-05-10 NOTE — Discharge Instructions (Signed)
DISCHARGE INSTRUCTIONS TO PATIENT  REMINDER:   Carry a list of your medications and allergies with you at all times  Call your pharmacy at least 1 week in advance to refill prescriptions  Do not mix any prescribed pain medicine with alcohol  Do not drive any motor vehicles while taking pain medication.  Take medications with food.  Do not retake a pain medication if you vomit after taking it.  Activity: no lifting more than 15 pounds until instructed by your doctor.   Dressing Care Instruction (if applicable):              Remove operative dressings in 48 hours.  May Shower-  Call office if any questions regarding this activity.  Dry Dressing as needed to operative site.  Drain care instructions provided to you in the hospital.   Follow-up appointments (date to return to physician): Call for appointment with Dr. Sherri Rad, MD at [phone] or [phone]  If need MD on call after hours and on weekends call Hospital operator at [phone] as ask to speak to Surgeon on call for [facility].  Call Surgeon if you have:  Temperature greater than 100.4  Persistent nausea and vomiting  Severe uncontrolled pain  Redness, tenderness, or signs of infection (pain, swelling, redness, odor or green/yellow discharge around the site)  Difficulty breathing, headache or visual disturbances  Hives  Persistent dizziness or light-headedness  Extreme fatigue  Any other questions or concerns you may have after discharge  In an emergency, call 911 or go to an Emergency Department at a nearby hospital  Diet:  Resume your usual diet.  Avoid spicy, greasy or heavy foods.  If you have nausea or vomiting, go back to liquids.  If you cannot keep liquids down, call your doctor.  Avoid alcohol consumption while on prescription pain medications. Good nutrition promotes healing. Increase fiber and fluids.     I understand and acknowledge receipt of the above instructions.                                                                                                                                         Patient or Guardian Signature                                                                    Date/Time  Physician's or R.N.'s Signature                                                                  Date/Time  The discharge instructions have been reviewed with the patient and/or Family Member/Parent/Guardian.  Patient and/or Family Member/Parent/Guardian signed and retained a printed copy.

## 2015-05-10 NOTE — Progress Notes (Signed)
[facility]   PATIENT NAME: Elizabeth [name]    MR#:  [id number]  DATE OF BIRTH:  [birth year]  SUBJECTIVE:  She is feeling better, tolerated diet, passed flatus and had BM last night.  Started on oral cipro and flagyl yesterday.  No fevers or nausea or emesis.  REVIEW OF SYSTEMS:   Review of Systems  Constitutional: Negative for fever and chills.  HENT: Negative for hearing loss.   Gastrointestinal: Negative for nausea, vomiting, abdominal pain, diarrhea, blood in stool and melena.  Neurological: Negative for headaches.  All other systems reviewed and are negative.   DRUG ALLERGIES:   Allergies  Allergen Reactions  . Penicillins Hives    VITALS:  Blood pressure 114/65, pulse 71, temperature 97.6 F (36.4 C), temperature source Oral, resp. rate 16, height 5\' 6"  (1.676 m), weight 109.226 kg (240 lb 12.8 oz), SpO2 98 %.  PHYSICAL EXAMINATION:  GENERAL:  49 y.o.-year-old patient lying in the bed with no acute distress.  EYES: Pupils equal, round, reactive to light and accommodation. No scleral icterus. Extraocular muscles intact.  HEENT: Head atraumatic, normocephalic. Oropharynx and nasopharynx clear.  NECK:  Supple, no jugular venous distention. No thyroid enlargement, no tenderness.  LUNGS: Normal breath sounds bilaterally, no wheezing, rales,rhonchi or crepitation. No use of accessory muscles of respiration.  CARDIOVASCULAR: S1, S2 normal. No murmurs, rubs, or gallops.  ABDOMEN: Soft, nontender, nondistended. Bowel sounds present. No organomegaly or mass. No LLQ tenderness.   EXTREMITIES: No pedal edema, cyanosis, or clubbing.  NEUROLOGIC: Cranial nerves II through XII are intact. Muscle strength 5/5 in all extremities. Sensation intact. Gait not checked.  PSYCHIATRIC: The patient is alert and oriented x 3.  SKIN: No obvious rash, lesion, or ulcer.     ASSESSMENT AND PLAN:   Improved descending colon diverticulitis.  She is stable on oral antibiotics and regular  diet with no further signs of peritoneal signs. She is stable and improved for discharge and I'll see her back next week in the office. Discharge instructions were provided.

## 2015-05-10 NOTE — Progress Notes (Signed)
A&O. VSS. Tolerating diet well. No complaints of pain or nausea. Ambulating unassisted with steady gait. Discharged per MD orders. Discharge instructions reviewed with pt and pt verbalized understanding. IV removed per policy. prescriptions given to pt. Pt discharged via wheelchair escorted by auxilary.

## 2015-05-10 NOTE — Discharge Summary (Signed)
Physician Discharge Summary  Patient ID: Elizabeth [name] MRN: [id number] DOB/AGE: [birth year] 49 y.o.  Admit date: 05/07/2015 Discharge date: 05/10/2015  Admission Diagnoses: Descending colon diverticulitis  Discharge Diagnoses:  Active Problems:   Diverticulitis large intestine   Discharged Condition: Stable and improved  Hospital Course: Patient was admitted for bowel rest and intravenous antibiotics. She had prompt resolution of her symptomatology was switched over to oral medications which she tolerated. She had a bowel movement and tolerating a regular diet and her examination became benign. As such she was deemed suitable for discharge on the morning of the 05/10/2015.   Significant Diagnostic Studies: CT scan abdomen and pelvis  Treatments: Intravenous antibiotics  Discharge Exam: Blood pressure 114/65, pulse 71, temperature 97.6 F (36.4 C), temperature source Oral, resp. rate 16, height 5\' 6"  (1.676 m), weight 109.226 kg (240 lb 12.8 oz), SpO2 98 %.   Her abdomen was soft and nontender upon discharge.  Disposition: Home  Discharge Instructions    Call MD for:  persistant nausea and vomiting    Complete by:  As directed      Call MD for:  severe uncontrolled pain    Complete by:  As directed      Call MD for:  temperature >100.4    Complete by:  As directed      Diet general    Complete by:  As directed   Low fiber diet.     Increase activity slowly    Complete by:  As directed             Medication List    TAKE these medications        aspirin EC 81 MG tablet  Take 81 mg by mouth daily.     ciprofloxacin 500 MG tablet  Commonly known as:  CIPRO  Take 1 tablet (500 mg total) by mouth 2 (two) times daily.     folic acid 1 MG tablet  Commonly known as:  FOLVITE  Take 1 mg by mouth daily.     HYDROcodone-acetaminophen 5-325 MG per tablet  Commonly known as:  NORCO/VICODIN  Take 1 tablet by mouth every 6 (six) hours as needed for moderate pain.     LINZESS 290 MCG Caps capsule  Generic drug:  Linaclotide  Take 290 mcg by mouth every morning.     methotrexate 2.5 MG tablet  Commonly known as:  RHEUMATREX  Take 15 mg by mouth once a week.     metroNIDAZOLE 500 MG tablet  Commonly known as:  FLAGYL  Take 1 tablet (500 mg total) by mouth 3 (three) times daily.     OLANZapine 10 MG tablet  Commonly known as:  ZYPREXA  Take 10 mg by mouth at bedtime.     omeprazole 40 MG capsule  Commonly known as:  PRILOSEC  Take 40 mg by mouth 2 (two) times daily.     ondansetron 4 MG tablet  Commonly known as:  ZOFRAN  Take 1 tablet (4 mg total) by mouth every 6 (six) hours as needed for nausea.     traZODone 100 MG tablet  Commonly known as:  DESYREL  Take 300 mg by mouth at bedtime.           Follow-up Information    Follow up with Sherri Rad, MD On 05/23/2015.   Specialties:  Surgery, Radiology   Why:  [facility] office location in am.   Contact information:   [phone] [address] [place]  [place] [address] [phone]       Signed: Sherri Rad 05/10/2015, 9:42 AM

## 2015-05-11 DIAGNOSIS — F250 Schizoaffective disorder, bipolar type: Secondary | ICD-10-CM | POA: Diagnosis not present

## 2015-05-12 LAB — CULTURE, BLOOD (ROUTINE X 2)
Culture: NO GROWTH
Culture: NO GROWTH
SPECIAL REQUESTS: NORMAL
Special Requests: NORMAL

## 2015-05-15 DIAGNOSIS — F250 Schizoaffective disorder, bipolar type: Secondary | ICD-10-CM | POA: Diagnosis not present

## 2015-05-17 ENCOUNTER — Telehealth: Payer: Self-pay | Admitting: Surgery

## 2015-05-17 DIAGNOSIS — B379 Candidiasis, unspecified: Secondary | ICD-10-CM

## 2015-05-17 MED ORDER — FLUCONAZOLE 100 MG PO TABS: 100.0000 mg | ORAL_TABLET | Freq: Every day | ORAL | Status: DC

## 2015-05-17 NOTE — Telephone Encounter (Signed)
Patient was seen by Dr Marina Gravel in the hospital and he prescribed an antibiotic. She now has a yeast infection from the antibiotic. Can a prescription be called in for the yeast infection. Please call patient and advise

## 2015-05-17 NOTE — Telephone Encounter (Signed)
Spoke with patient at this time. Pharmacy verified. Informed patient that I would be sending in a medication to help with her yeast infection. Verbalizes understanding.

## 2015-05-17 NOTE — Telephone Encounter (Signed)
ERROR

## 2015-05-24 DIAGNOSIS — F17210 Nicotine dependence, cigarettes, uncomplicated: Secondary | ICD-10-CM | POA: Diagnosis not present

## 2015-05-24 DIAGNOSIS — M25551 Pain in right hip: Secondary | ICD-10-CM | POA: Diagnosis not present

## 2015-05-24 DIAGNOSIS — R769 Abnormal immunological finding in serum, unspecified: Secondary | ICD-10-CM | POA: Diagnosis not present

## 2015-05-24 DIAGNOSIS — R760 Raised antibody titer: Secondary | ICD-10-CM | POA: Diagnosis not present

## 2015-05-24 DIAGNOSIS — M25552 Pain in left hip: Secondary | ICD-10-CM | POA: Diagnosis not present

## 2015-05-24 DIAGNOSIS — Z79899 Other long term (current) drug therapy: Secondary | ICD-10-CM | POA: Diagnosis not present

## 2015-05-24 DIAGNOSIS — M0609 Rheumatoid arthritis without rheumatoid factor, multiple sites: Secondary | ICD-10-CM | POA: Diagnosis not present

## 2015-05-25 ENCOUNTER — Ambulatory Visit: Payer: Self-pay | Admitting: Surgery

## 2015-06-01 ENCOUNTER — Encounter: Payer: Self-pay | Admitting: Surgery

## 2015-06-01 ENCOUNTER — Ambulatory Visit (INDEPENDENT_AMBULATORY_CARE_PROVIDER_SITE_OTHER): Payer: Medicare Other | Admitting: Surgery

## 2015-06-01 VITALS — BP 135/85 | HR 74 | Temp 97.7°F | Resp 20 | Ht 66.0 in | Wt 236.0 lb

## 2015-06-01 DIAGNOSIS — K5732 Diverticulitis of large intestine without perforation or abscess without bleeding: Secondary | ICD-10-CM

## 2015-06-01 MED ORDER — CIPROFLOXACIN HCL 500 MG PO TABS: 500.0000 mg | ORAL_TABLET | Freq: Two times a day (BID) | ORAL | Status: DC

## 2015-06-01 MED ORDER — METRONIDAZOLE 500 MG PO TABS: 500.0000 mg | ORAL_TABLET | Freq: Three times a day (TID) | ORAL | Status: DC

## 2015-06-01 MED ORDER — ONDANSETRON HCL 4 MG PO TABS: 4.0000 mg | ORAL_TABLET | Freq: Three times a day (TID) | ORAL | Status: DC | PRN

## 2015-06-01 MED ORDER — HYDROCODONE-ACETAMINOPHEN 5-325 MG PO TABS: 1.0000 | ORAL_TABLET | Freq: Four times a day (QID) | ORAL | Status: DC | PRN

## 2015-06-01 NOTE — Addendum Note (Signed)
Addended by: Rodena Goldmann on: 06/01/2015 03:11 PM   Modules accepted: Orders

## 2015-06-01 NOTE — Progress Notes (Signed)
  Surgical Consultation  06/01/2015  Elizabeth [name] is an 49 y.o. female.   Chief Complaint  Patient presents with  . Diverticulitis    ER F/U      HPI: She returns in the abscess for follow-up after her recent hospitalization for present perforated diverticulitis. She was admitted with significant left lower quadrant pain and CT scan demonstrated extraluminal air and inflammatory changes consistent with perforated diverticulitis. She improved on antibiotic therapy was discontinued home on antibiotics. She's done well until the last 48 hours. She's had significant left lower quadrant abdominal pain without fever or chills. She has had no nausea or vomiting.  Past Medical History  Diagnosis Date  . Irritable bowel syndrome   . RA (rheumatoid arthritis)   . Bipolar 1 disorder   . Insomnia   . GERD (gastroesophageal reflux disease)     Past Surgical History  Procedure Laterality Date  . Cholecystectomy    . Tubal ligation    . Esophagogastroduodenoscopy (egd) with esophageal dilation      Family History  Problem Relation Age of Onset  . Hypertension Mother   . Diabetes Mother   . Diverticulosis Mother   . Alcohol abuse Father   . Diverticulosis Maternal Aunt   . Diverticulosis Maternal Grandmother     Social History:  reports that she has quit smoking. Her smoking use included Cigarettes. She smoked 0.50 packs per day. She has never used smokeless tobacco. She reports that she does not drink alcohol or use illicit drugs.  Allergies:  Allergies  Allergen Reactions  . Penicillins Hives    Medications reviewed.     ROS 10 point review of systems was carried out and she has no other symptoms and those noted above     BP 135/85 mmHg  Pulse 74  Temp(Src) 97.7 F (36.5 C) (Oral)  Resp 20  Ht 5\' 6"  (1.676 m)  Wt 236 lb (107.049 kg)  BMI 38.11 kg/m2  Physical Exam  Constitutional: She is oriented to person, place, and time. No distress.  Mildly obese  HENT:   Head: Normocephalic and atraumatic.  Eyes: EOM are normal. Pupils are equal, round, and reactive to light.  Neck: Normal range of motion. Neck supple.  Cardiovascular: Normal rate and regular rhythm.   Pulmonary/Chest: Effort normal and breath sounds normal.  Abdominal: Soft. Bowel sounds are normal. There is tenderness. There is guarding.  Musculoskeletal: Normal range of motion. She exhibits no edema or tenderness.  Neurological: She is alert and oriented to person, place, and time.  Skin: Skin is warm and dry.  Psychiatric: Mood and affect normal.      No results found for this or any previous visit (from the past 48 hour(s)). No results found.  Assessment/Plan: 1. Diverticulitis of colon She appears to be symptomatic once again. We will restart her on antibiotic therapy and see about repeating her CAT scan. She is in agreement with this plan.   Dia Crawford III dermatitis

## 2015-06-01 NOTE — Addendum Note (Signed)
Addended by: Rodena Goldmann on: 06/01/2015 02:34 PM   Modules accepted: Orders

## 2015-06-07 ENCOUNTER — Ambulatory Visit
Admission: RE | Admit: 2015-06-07 | Discharge: 2015-06-07 | Disposition: A | Discharge status: Home / Self Care | Payer: Medicare Other | Source: Ambulatory Visit | Attending: Surgery | Admitting: Surgery

## 2015-06-07 DIAGNOSIS — K4090 Unilateral inguinal hernia, without obstruction or gangrene, not specified as recurrent: Secondary | ICD-10-CM | POA: Insufficient documentation

## 2015-06-07 DIAGNOSIS — K5732 Diverticulitis of large intestine without perforation or abscess without bleeding: Secondary | ICD-10-CM | POA: Diagnosis not present

## 2015-06-07 MED ORDER — IOHEXOL 350 MG/ML SOLN
100.0000 mL | Freq: Once | INTRAVENOUS | Status: AC | PRN
  Administered 2015-06-07: 100 mL via INTRAVENOUS

## 2015-06-15 ENCOUNTER — Telehealth: Payer: Self-pay

## 2015-06-15 NOTE — Telephone Encounter (Signed)
Patient returned phone call at this time. I explained results to patient. F/U appointment made with Dr. Pat Patrick on 07/06/15.

## 2015-06-15 NOTE — Telephone Encounter (Signed)
Called patient regarding normal CT result and that she needs to follow-up with office visit for Diverticulitis per Dr. Pat Patrick.   No answer. Left VM asking patient to return phone call.

## 2015-06-19 DIAGNOSIS — M533 Sacrococcygeal disorders, not elsewhere classified: Secondary | ICD-10-CM | POA: Diagnosis not present

## 2015-06-19 DIAGNOSIS — M069 Rheumatoid arthritis, unspecified: Secondary | ICD-10-CM | POA: Diagnosis not present

## 2015-06-19 DIAGNOSIS — M25552 Pain in left hip: Secondary | ICD-10-CM | POA: Diagnosis not present

## 2015-06-19 DIAGNOSIS — M25551 Pain in right hip: Secondary | ICD-10-CM | POA: Diagnosis not present

## 2015-06-19 DIAGNOSIS — M7061 Trochanteric bursitis, right hip: Secondary | ICD-10-CM | POA: Diagnosis not present

## 2015-06-19 DIAGNOSIS — M7062 Trochanteric bursitis, left hip: Secondary | ICD-10-CM | POA: Diagnosis not present

## 2015-06-21 ENCOUNTER — Ambulatory Visit: Payer: Self-pay | Admitting: Family Medicine

## 2015-06-26 DIAGNOSIS — R769 Abnormal immunological finding in serum, unspecified: Secondary | ICD-10-CM | POA: Diagnosis not present

## 2015-06-26 DIAGNOSIS — Z79899 Other long term (current) drug therapy: Secondary | ICD-10-CM | POA: Diagnosis not present

## 2015-06-26 DIAGNOSIS — M0609 Rheumatoid arthritis without rheumatoid factor, multiple sites: Secondary | ICD-10-CM | POA: Diagnosis not present

## 2015-07-03 ENCOUNTER — Encounter: Payer: Self-pay | Admitting: Family Medicine

## 2015-07-03 ENCOUNTER — Encounter (INDEPENDENT_AMBULATORY_CARE_PROVIDER_SITE_OTHER): Payer: Self-pay

## 2015-07-03 ENCOUNTER — Ambulatory Visit (INDEPENDENT_AMBULATORY_CARE_PROVIDER_SITE_OTHER): Payer: Medicare Other | Admitting: Family Medicine

## 2015-07-03 VITALS — BP 128/90 | HR 90 | Temp 97.3°F | Resp 18 | Ht 66.0 in | Wt 241.0 lb

## 2015-07-03 DIAGNOSIS — M7071 Other bursitis of hip, right hip: Secondary | ICD-10-CM | POA: Diagnosis not present

## 2015-07-03 DIAGNOSIS — F3132 Bipolar disorder, current episode depressed, moderate: Secondary | ICD-10-CM | POA: Diagnosis not present

## 2015-07-03 DIAGNOSIS — M7072 Other bursitis of hip, left hip: Secondary | ICD-10-CM | POA: Diagnosis not present

## 2015-07-03 DIAGNOSIS — E669 Obesity, unspecified: Secondary | ICD-10-CM | POA: Insufficient documentation

## 2015-07-03 DIAGNOSIS — K219 Gastro-esophageal reflux disease without esophagitis: Secondary | ICD-10-CM

## 2015-07-03 DIAGNOSIS — K589 Irritable bowel syndrome without diarrhea: Secondary | ICD-10-CM | POA: Diagnosis not present

## 2015-07-03 DIAGNOSIS — M069 Rheumatoid arthritis, unspecified: Secondary | ICD-10-CM

## 2015-07-03 DIAGNOSIS — K5732 Diverticulitis of large intestine without perforation or abscess without bleeding: Secondary | ICD-10-CM | POA: Diagnosis not present

## 2015-07-03 DIAGNOSIS — F319 Bipolar disorder, unspecified: Secondary | ICD-10-CM | POA: Insufficient documentation

## 2015-07-03 DIAGNOSIS — G4700 Insomnia, unspecified: Secondary | ICD-10-CM | POA: Insufficient documentation

## 2015-07-03 HISTORY — DX: Obesity, unspecified: E66.9

## 2015-07-03 NOTE — Progress Notes (Signed)
Name: Elizabeth [name]   MRN: [id number]    DOB: [birth year]   Date:07/03/2015       Progress Note  Subjective  Chief Complaint  Chief Complaint  Patient presents with  . Establish Care  . Referral    patient needs several referrals to be placed.    HPI  GERD: Paitent complains of heartburn for many years currently on Lansoprazole 38m a day. This has been associated with abdominal bloating, fullness after meals, heartburn and upper abdominal discomfort.  She denies unwanted weight loss, nausea, vomiting, pain with swallowing food or liquid. Symptoms have been present for several years. She denies dysphagia.  She has not lost weight. She denies melena, hematochezia, hematemesis, and coffee ground emesis. Medical therapy in the past has included antacids, H2 antagonists and proton pump inhibitors. Has had a hiatal hernia in the past with dilation surgery. She feels that she may need a repeat EGD at some point due to recurrent reflux symptoms. She would like a referral to her GI physician she has already seen Dr. [place]arlina [place]umpfin [place].   Diverticulitis: Initial onset of symptoms in May 2016 with one time reoccurrence since then. LLQ and LUQ discomfort, pain, without fevers or rectal bleeding. CT scan at the time showed diverticulitis with micro-perforations, not a surgical candidate. Recovered well with oral antibiotics alone. Due to follow up with Dr. EPat Patrickon 07/06/15 and needs referral.   Joint/Muscle Pain: Patient complains of arthralgias for which has been present for several years. Pain is located in both hip(s), diagnosed with hip bursitis, is described as aching, sharp and throbbing, and is moderate .  Associated symptoms include: decreased range of motion.  The patient has consulted with [facility], Dr. MSabra Heckand needs referal renewal to continue seeing him.   Rheumatoid Arthritis: Patient has been previously misdiagnosed with SLE but more recent work [place] out SLE and she has been diagnosed as having Rheumatoid Arthritis for which she is taking injectable Methotrexate and oral Folate. She has brought recent lab work to be reviewed with me. No history of blood clots. She plans on continuing to work with Dr. ADeatra Canterdespite the travel because of great satisfaction with her specialist.   B[facility] disorder: [facility] Doing well on current medication of Olanzapine. Was previously on SSRI, SNRI, Depakote to name a few.    Patient Active Problem List   Diagnosis Date Noted  . Bipolar disorder, current episode depressed, moderate 07/03/2015  . Chronic rheumatic arthritis 07/03/2015  . IBS (irritable bowel syndrome) 07/03/2015  . Insomnia, uncontrolled 07/03/2015  . GERD without esophagitis 07/03/2015  . Obesity, Class II, BMI 35-39.9 07/03/2015  . Diverticulitis large intestine 05/07/2015    History  Substance Use Topics  . Smoking status: Former Smoker -- 0.50 packs/day    Types: Cigarettes  . Smokeless tobacco: Never Used  . Alcohol Use: No     Current outpatient prescriptions:  .  aspirin EC 81 MG tablet, Take 81 mg by mouth daily., Disp: , Rfl:  .  B-D INS SYR ULTRAFINE 1CC/31G 31G X 5/16" 1 ML MISC, use to inject METHOTREXATE, Disp: , Rfl: 0 .  Cholecalciferol (VITAMIN D3) 2000 UNITS capsule, Take 2,000 Units by mouth daily., Disp: , Rfl:  .  folic acid (FOLVITE) 1 MG tablet, Take 1 mg by mouth daily., Disp: , Rfl: 0 .  Homeopathic Products (SIMILASAN DRY EYE RELIEF) SOLN, Apply to eye.,  Disp: , Rfl:  .  HYDROcodone-acetaminophen (NORCO) 5-325 MG per tablet, Take 1-2 tablets by mouth every 6 (six) hours as needed for moderate pain., Disp: 30 tablet, Rfl: 0 .  lansoprazole (PREVACID) 30 MG capsule, take 1 capsule by mouth once daily 30 MINUTES PRIOR TO BREAKFAST, Disp: , Rfl: 1 .  LINZESS 290 MCG CAPS capsule, Take 290 mcg by mouth every morning., Disp: , Rfl: 1 .  methotrexate 50 MG/2ML  injection, inject 0.8 milliliter subcutaneously every week, Disp: , Rfl: 0 .  OLANZapine (ZYPREXA) 15 MG tablet, , Disp: , Rfl: 0 .  ondansetron (ZOFRAN) 4 MG tablet, Take 1 tablet (4 mg total) by mouth every 8 (eight) hours as needed for nausea or vomiting., Disp: 20 tablet, Rfl: 0 .  Zinc 50 MG CAPS, Take by mouth., Disp: , Rfl:   Past Surgical History  Procedure Laterality Date  . Cholecystectomy    . Tubal ligation    . Esophagogastroduodenoscopy (egd) with esophageal dilation      Family History  Problem Relation Age of Onset  . Hypertension Mother   . Diabetes Mother   . Diverticulosis Mother   . Alcohol abuse Father   . Diverticulosis Maternal Aunt   . Diverticulosis Maternal Grandmother     Allergies  Allergen Reactions  . Penicillins Hives     Review of Systems  CONSTITUTIONAL: No significant weight changes, fever, chills, weakness or fatigue.  HEENT:  - Eyes: No visual changes.  - Ears: No auditory changes. No pain.  - Nose: No sneezing, congestion, runny nose. - Throat: No sore throat. No changes in swallowing. SKIN: No rash or itching.  CARDIOVASCULAR: No chest pain, chest pressure or chest discomfort. No palpitations or edema.  RESPIRATORY: No shortness of breath, cough or sputum.  GASTROINTESTINAL: No anorexia, nausea, vomiting. No changes in bowel habits. No abdominal pain or blood. Yes reflux symptoms. GENITOURINARY: No dysuria. No frequency. No discharge. NEUROLOGICAL: No headache, dizziness, syncope, paralysis, ataxia, numbness or tingling in the extremities. No memory changes. No change in bowel or bladder control.  MUSCULOSKELETAL: Yes joint pain. No muscle pain. HEMATOLOGIC: No anemia, bleeding or bruising.  LYMPHATICS: No enlarged lymph nodes.  PSYCHIATRIC: No change in mood. No change in sleep pattern.  ENDOCRINOLOGIC: No reports of sweating, cold or heat intolerance. No polyuria or polydipsia.      Objective  BP 128/90 mmHg  Pulse 90   Temp(Src) 97.3 F (36.3 C) (Oral)  Resp 18  Ht '5\' 6"'  (1.676 m)  Wt 241 lb (109.317 kg)  BMI 38.92 kg/m2  SpO2 98% Body mass index is 38.92 kg/(m^2).  Physical Exam  Constitutional: Patient is obese and well-nourished. In no distress.  HEENT:  - Head: Normocephalic and atraumatic.  - Ears: Bilateral TMs gray, no erythema or effusion - Nose: Nasal mucosa moist - Mouth/Throat: Oropharynx is clear and moist. No tonsillar hypertrophy or erythema. No post nasal drainage.  - Eyes: Conjunctivae clear, EOM movements normal. PERRLA. No scleral icterus.  Neck: Normal range of motion. Neck supple. No JVD present. No thyromegaly present.  Cardiovascular: Normal rate, regular rhythm and normal heart sounds.  No murmur heard.  Pulmonary/Chest: Effort normal and breath sounds normal. No respiratory distress. Musculoskeletal: Normal range of motion bilateral UE and LE, no joint effusions. Peripheral vascular: Bilateral LE no edema. Neurological: CN II-XII grossly intact with no focal deficits. Alert and oriented to person, place, and time. Coordination, balance, strength, speech and gait are normal.  Skin: Skin is warm  and dry. No rash noted. No erythema.  Psychiatric: Patient has a normal mood and affect. Behavior is normal in office today. Judgment and thought content normal in office today.   Recent Results (from the past 2160 hour(s))  Troponin I     Status: None   Collection Time: 05/07/15  7:41 AM  Result Value Ref Range   Troponin I <0.03 <0.031 ng/mL    Comment:        NO INDICATION OF MYOCARDIAL INJURY.   CBC with Differential     Status: Abnormal   Collection Time: 05/07/15  8:15 AM  Result Value Ref Range   WBC 8.8 3.6 - 11.0 K/uL   RBC 4.11 3.80 - 5.20 MIL/uL   Hemoglobin 12.1 12.0 - 16.0 g/dL   HCT 36.8 35.0 - 47.0 %   MCV 89.4 80.0 - 100.0 fL   MCH 29.5 26.0 - 34.0 pg   MCHC 33.0 32.0 - 36.0 g/dL   RDW 14.6 (H) 11.5 - 14.5 %   Platelets 237 150 - 440 K/uL   Neutrophils  Relative % 71 %   Neutro Abs 6.4 1.4 - 6.5 K/uL   Lymphocytes Relative 20 %   Lymphs Abs 1.8 1.0 - 3.6 K/uL   Monocytes Relative 7 %   Monocytes Absolute 0.6 0.2 - 0.9 K/uL   Eosinophils Relative 1 %   Eosinophils Absolute 0.0 0 - 0.7 K/uL   Basophils Relative 1 %   Basophils Absolute 0.1 0 - 0.1 K/uL  Comprehensive metabolic panel     Status: Abnormal   Collection Time: 05/07/15  8:15 AM  Result Value Ref Range   Sodium 136 135 - 145 mmol/L   Potassium 3.6 3.5 - 5.1 mmol/L   Chloride 104 101 - 111 mmol/L   CO2 26 22 - 32 mmol/L   Glucose, Bld 144 (H) 65 - 99 mg/dL   BUN 12 6 - 20 mg/dL   Creatinine, Ser 0.85 0.44 - 1.00 mg/dL   Calcium 9.0 8.9 - 10.3 mg/dL   Total Protein 7.5 6.5 - 8.1 g/dL   Albumin 3.8 3.5 - 5.0 g/dL   AST 20 15 - 41 U/L   ALT 29 14 - 54 U/L   Alkaline Phosphatase 97 38 - 126 U/L   Total Bilirubin 0.6 0.3 - 1.2 mg/dL   GFR calc non Af Amer >60 >60 mL/min   GFR calc Af Amer >60 >60 mL/min    Comment: (NOTE) The eGFR has been calculated using the CKD EPI equation. This calculation has not been validated in all clinical situations. eGFR's persistently <60 mL/min signify possible Chronic Kidney Disease.    Anion gap 6 5 - 15  Urinalysis complete, with microscopic [facility])     Status: Abnormal   Collection Time: 05/07/15  8:15 AM  Result Value Ref Range   Color, Urine YELLOW (A) YELLOW   APPearance CLEAR (A) CLEAR   Glucose, UA NEGATIVE NEGATIVE mg/dL   Bilirubin Urine NEGATIVE NEGATIVE   Ketones, ur NEGATIVE NEGATIVE mg/dL   Specific Gravity, Urine 1.018 1.005 - 1.030   Hgb urine dipstick 1+ (A) NEGATIVE   pH 5.0 5.0 - 8.0   Protein, ur NEGATIVE NEGATIVE mg/dL   Nitrite NEGATIVE NEGATIVE   Leukocytes, UA NEGATIVE NEGATIVE   RBC / HPF 0-5 0 - 5 RBC/hpf   WBC, UA 0-5 0 - 5 WBC/hpf   Bacteria, UA RARE (A) NONE SEEN   Squamous Epithelial / LPF 0-5 (A) NONE SEEN  Mucous PRESENT   Lipase, blood     Status: None   Collection Time: 05/07/15  8:15 AM   Result Value Ref Range   Lipase 29 22 - 51 U/L  Pregnancy, urine POC     Status: None   Collection Time: 05/07/15  8:19 AM  Result Value Ref Range   Preg Test, Ur NEGATIVE NEGATIVE    Comment:        THE SENSITIVITY OF THIS METHODOLOGY IS >24 mIU/mL   Blood culture (routine x 2)     Status: None   Collection Time: 05/07/15 11:05 AM  Result Value Ref Range   Specimen Description BLOOD    Special Requests Normal    Culture NO GROWTH 5 DAYS    Report Status 05/12/2015 FINAL   Blood culture (routine x 2)     Status: None   Collection Time: 05/07/15 11:05 AM  Result Value Ref Range   Specimen Description BLOOD    Special Requests Normal    Culture NO GROWTH 5 DAYS    Report Status 05/12/2015 FINAL   Comprehensive metabolic panel     Status: Abnormal   Collection Time: 05/08/15  5:16 AM  Result Value Ref Range   Sodium 138 135 - 145 mmol/L   Potassium 3.6 3.5 - 5.1 mmol/L   Chloride 106 101 - 111 mmol/L   CO2 28 22 - 32 mmol/L   Glucose, Bld 162 (H) 65 - 99 mg/dL   BUN 8 6 - 20 mg/dL   Creatinine, Ser 0.81 0.44 - 1.00 mg/dL   Calcium 8.6 (L) 8.9 - 10.3 mg/dL   Total Protein 6.3 (L) 6.5 - 8.1 g/dL   Albumin 3.1 (L) 3.5 - 5.0 g/dL   AST 29 15 - 41 U/L   ALT 53 14 - 54 U/L   Alkaline Phosphatase 95 38 - 126 U/L   Total Bilirubin 0.8 0.3 - 1.2 mg/dL   GFR calc non Af Amer >60 >60 mL/min   GFR calc Af Amer >60 >60 mL/min    Comment: (NOTE) The eGFR has been calculated using the CKD EPI equation. This calculation has not been validated in all clinical situations. eGFR's persistently <60 mL/min signify possible Chronic Kidney Disease.    Anion gap 4 (L) 5 - 15     Assessment & Plan  1. Bilateral hip bursitis Referral placed as requested. Avoid excessive NSAID use as it may worsen GI issues.  - Ambulatory referral to Orthopedic Surgery  2. Diverticulitis of large intestine without perforation or abscess without bleeding Resolved with likely diverticulosis disease  which will need work up with Dr. Pat Patrick. Diet modification recommended.   - Ambulatory referral to General Surgery - Ambulatory referral to Gastroenterology  3. IBS (irritable bowel syndrome) Referral placed as requested.  Doing well on Linzess which I will refill when needed.  - Ambulatory referral to General Surgery - Ambulatory referral to Gastroenterology  4. GERD without esophagitis Referral placed as requested. She has quit smoking which should help.  - Ambulatory referral to General Surgery - Ambulatory referral to Gastroenterology  5. Chronic rheumatic arthritis Reviewed blood work, positive Lupus Anticoagulant which could potentially increase risk for blood clots however she has no previous history of clotting. Will inquire further at our next visit.  6. Bipolar disorder, current episode depressed, moderate Stable clinical picture on our first meeting. Continue working with [employer] clinic provider. Discussed metabolic side effects of Olanzapine, which I will screen for and monitor.

## 2015-07-04 ENCOUNTER — Encounter: Payer: Self-pay | Admitting: Family Medicine

## 2015-07-06 ENCOUNTER — Ambulatory Visit (INDEPENDENT_AMBULATORY_CARE_PROVIDER_SITE_OTHER): Payer: Medicare Other | Admitting: Surgery

## 2015-07-06 ENCOUNTER — Encounter: Payer: Self-pay | Admitting: Surgery

## 2015-07-06 ENCOUNTER — Encounter: Payer: Self-pay | Admitting: Gastroenterology

## 2015-07-06 ENCOUNTER — Ambulatory Visit: Payer: Medicare Other | Admitting: Surgery

## 2015-07-06 VITALS — BP 128/84 | HR 96 | Temp 97.9°F | Ht 66.0 in | Wt 242.0 lb

## 2015-07-06 DIAGNOSIS — K5732 Diverticulitis of large intestine without perforation or abscess without bleeding: Secondary | ICD-10-CM

## 2015-07-06 NOTE — Progress Notes (Signed)
Surgical Consultation  07/06/2015  Elizabeth [name] is an 49 y.o. female. Serial office for follow-up after her episode of diverticulitis.  Chief Complaint  Patient presents with  . Routine Post Op    diverticulitis     HPI: She was admitted to the hospital several weeks ago with an episode of descending colon and proximal sigmoid diverticulitis with questionable perforation. She responded to conservative therapy and has done well as an outpatient. She has completed her antibiotic course.  Follow-up CT scan reveals almost complete resolution of those changes in her colon. The majority of the diverticular disease appeared to be in the descending colon. She's having no GI symptoms at the present time.  She does have a history of previous esophageal stricture dilated year ago in [place]. She feels again as though she's having the same type of symptoms with difficulty swallowing. She would like to consider an evaluation to see if she needs another dilatation procedure. She is not vomiting.  Past Medical History  Diagnosis Date  . Irritable bowel syndrome   . RA (rheumatoid arthritis)   . Bipolar 1 disorder   . Insomnia   . GERD (gastroesophageal reflux disease)     Past Surgical History  Procedure Laterality Date  . Cholecystectomy    . Tubal ligation    . Esophagogastroduodenoscopy (egd) with esophageal dilation      Family History  Problem Relation Age of Onset  . Hypertension Mother   . Diabetes Mother   . Diverticulosis Mother   . Alcohol abuse Father   . Diverticulosis Maternal Aunt   . Diverticulosis Maternal Grandmother     Social History:  reports that she has quit smoking. Her smoking use included Cigarettes. She smoked 0.50 packs per day. She has never used smokeless tobacco. She reports that she does not drink alcohol or use illicit drugs.  Allergies:  Allergies  Allergen Reactions  . Penicillins Hives    Medications reviewed.     Review of Systems   Constitutional: Negative for fever, chills and weight loss.  HENT: Negative.   Eyes: Negative.   Respiratory: Negative for cough and shortness of breath.   Cardiovascular: Negative for chest pain and palpitations.  Gastrointestinal: Positive for heartburn. Negative for nausea, vomiting, abdominal pain and constipation.  Genitourinary: Negative.   Musculoskeletal: Negative.   Neurological: Negative.   Psychiatric/Behavioral: Negative.        BP 128/84 mmHg  Pulse 96  Temp(Src) 97.9 F (36.6 C) (Oral)  Ht 5\' 6"  (1.676 m)  Wt 109.77 kg (242 lb)  BMI 39.08 kg/m2  Physical Exam  Constitutional: She is oriented to person, place, and time.  Obese  HENT:  Head: Normocephalic and atraumatic.  Eyes: Conjunctivae are normal. Pupils are equal, round, and reactive to light.  Neck: Normal range of motion. Neck supple.  Cardiovascular: Regular rhythm and normal heart sounds.   Pulmonary/Chest: Effort normal and breath sounds normal.  Abdominal: Soft. Bowel sounds are normal.  She has no abdominal tenderness and no abdominal guarding  Musculoskeletal: Normal range of motion. She exhibits no edema.  Neurological: She is alert and oriented to person, place, and time.  Skin: Skin is warm and dry.  Psychiatric: Mood and affect normal.      No results found for this or any previous visit (from the past 48 hour(s)). No results found.  Assessment/Plan: 1. Diverticulitis of colon Her episode of diverticulitis was her first episode of such a problem. Her CT findings have improved dramatically.  She does not want to consider surgery if possible I think with resolution of her CT findings that course is probably a reasonable 1. We will not continue her antibiotics. Should she have further problems we'll see her back in the office and discussed surgical intervention.  We will affect and internal gastroenterology referral to consider repeat EGD and possible esophageal dilatation. She is in  agreement with this plan. We will also see if we can arrange to get her EGD report from [facility] from last year. She is in agreement   Dia Crawford III dermatitis

## 2015-07-13 DIAGNOSIS — F250 Schizoaffective disorder, bipolar type: Secondary | ICD-10-CM | POA: Diagnosis not present

## 2015-07-26 ENCOUNTER — Encounter: Payer: Medicare Other | Admitting: Family Medicine

## 2015-07-27 ENCOUNTER — Telehealth: Payer: Self-pay | Admitting: Gastroenterology

## 2015-07-27 NOTE — Telephone Encounter (Signed)
Spoke with pt regarding her symptoms. Decided to schedule her with Dr. Pat Patrick tomorrow.

## 2015-07-27 NOTE — Telephone Encounter (Signed)
Patient left voice message that she is having pain in her back and side. Concerned about her diverticulitis. Please call.

## 2015-07-28 ENCOUNTER — Ambulatory Visit: Payer: Medicare Other | Admitting: Surgery

## 2015-07-28 ENCOUNTER — Ambulatory Visit (INDEPENDENT_AMBULATORY_CARE_PROVIDER_SITE_OTHER): Payer: Medicare Other | Admitting: Surgery

## 2015-07-28 ENCOUNTER — Encounter: Payer: Self-pay | Admitting: Surgery

## 2015-07-28 ENCOUNTER — Other Ambulatory Visit: Payer: Self-pay | Admitting: Surgery

## 2015-07-28 VITALS — BP 139/95 | HR 93 | Temp 98.0°F | Ht 68.0 in | Wt 243.6 lb

## 2015-07-28 DIAGNOSIS — K5732 Diverticulitis of large intestine without perforation or abscess without bleeding: Secondary | ICD-10-CM

## 2015-07-28 MED ORDER — HYDROCODONE-ACETAMINOPHEN 5-325 MG PO TABS: 1.0000 | ORAL_TABLET | Freq: Four times a day (QID) | ORAL | Status: DC | PRN

## 2015-07-28 MED ORDER — CLINDAMYCIN HCL 300 MG PO CAPS: 300.0000 mg | ORAL_CAPSULE | Freq: Three times a day (TID) | ORAL | Status: DC

## 2015-07-28 MED ORDER — ONDANSETRON HCL 4 MG PO TABS: 4.0000 mg | ORAL_TABLET | Freq: Three times a day (TID) | ORAL | Status: DC | PRN

## 2015-07-28 MED ORDER — CIPROFLOXACIN HCL 500 MG PO TABS: 500.0000 mg | ORAL_TABLET | Freq: Two times a day (BID) | ORAL | Status: DC

## 2015-07-28 NOTE — Progress Notes (Signed)
  Surgical Consultation  07/28/2015  Elizabeth [name] is an 49 y.o. female.   Chief Complaint  Patient presents with  . Diverticulitis     HPI: She returns was persistent/recurrent symptoms of diverticulitis. She has no fever or chills but new onset left lower quadrant suprapubic left back pain. She's had normal bowel function. Her last CT scan demonstrated almost complete resolution of her symptoms and CT findings.  Past Medical History  Diagnosis Date  . Irritable bowel syndrome   . RA (rheumatoid arthritis)   . Bipolar 1 disorder   . Insomnia   . GERD (gastroesophageal reflux disease)   . Diverticulosis     Past Surgical History  Procedure Laterality Date  . Tubal ligation    . Esophagogastroduodenoscopy (egd) with esophageal dilation    . Cholecystectomy  1997    [place], [place]    Family History  Problem Relation Age of Onset  . Hypertension Mother   . Diabetes Mother   . Diverticulosis Mother   . Alcohol abuse Father   . Diverticulosis Maternal Aunt   . Diverticulosis Maternal Grandmother     Social History:  reports that she quit smoking about 2 months ago. Her smoking use included Cigarettes. She smoked 0.50 packs per day. She has never used smokeless tobacco. She reports that she does not drink alcohol or use illicit drugs.  Allergies:  Allergies  Allergen Reactions  . Penicillins Hives    Medications reviewed.     ROS no significant change in her review of systems. She has no new problems of the nose noted above.     BP 139/95 mmHg  Pulse 93  Temp(Src) 98 F (36.7 C) (Oral)  Ht 5\' 8"  (1.727 m)  Wt 243 lb 9.6 oz (110.496 kg)  BMI 37.05 kg/m2  Physical Exam her abdomen is moderately tender over the suprapubic area and left lower quadrant with some mild guarding but no rebound. She has active bowel sounds. Lungs are clear bilaterally and she has no cardiac changes.    No results found for this or any previous visit (from the past 48 hour(s)). No  results found.  Assessment/Plan: 1. Diverticulitis of colon She does not have a fever chills does not appear to be systemically ill. We will replace her antibiotic therapy and try to see her back again in 10 days time. I'm a bit confused because her CT scan demonstrated almost complete resolution of the CT changes and they were not that impressive at the original evaluation. We'll place her antibiotics see her back in the office and make a decision about further intervention. She's not improved in 48 hours we'll recommend hospitalization with IV antibiotics.   Dia Crawford III dermatitis

## 2015-07-28 NOTE — Patient Instructions (Signed)
We have placed you antibiotics today. Begin them today and take them in their entirety. If you are feeling worse or not better by Monday, call the office and speak with a nurse.  Over the weekend, if you develop a fever > 101.5 or symptoms become severe, please go to the Emergency Room.  Please follow-up in our office with Dr. Pat Patrick in 10 days.

## 2015-08-10 ENCOUNTER — Encounter: Payer: Self-pay | Admitting: Surgery

## 2015-08-10 ENCOUNTER — Ambulatory Visit (INDEPENDENT_AMBULATORY_CARE_PROVIDER_SITE_OTHER): Payer: Medicare Other | Admitting: Surgery

## 2015-08-10 VITALS — BP 145/93 | HR 86 | Temp 97.9°F | Wt 245.0 lb

## 2015-08-10 DIAGNOSIS — K5732 Diverticulitis of large intestine without perforation or abscess without bleeding: Secondary | ICD-10-CM

## 2015-08-10 MED ORDER — CLINDAMYCIN HCL 300 MG PO CAPS: 300.0000 mg | ORAL_CAPSULE | Freq: Three times a day (TID) | ORAL | Status: DC

## 2015-08-10 MED ORDER — CIPROFLOXACIN HCL 500 MG PO TABS: 500.0000 mg | ORAL_TABLET | Freq: Two times a day (BID) | ORAL | Status: DC

## 2015-08-10 NOTE — Progress Notes (Signed)
Outpatient Surgical Follow Up  08/10/2015  Elizabeth [name] is an 49 y.o. female.   Chief Complaint  Patient presents with  . Follow-up    diverticulitis resolution    HPI: She returns today for follow-up. She's not having any other symptoms at the present time. She feels as though her recent flareup is completely resolved. There is no evidence for any fever or chills diarrhea or abdominal pain.  Past Medical History  Diagnosis Date  . Irritable bowel syndrome   . RA (rheumatoid arthritis)   . Bipolar 1 disorder   . Insomnia   . GERD (gastroesophageal reflux disease)   . Diverticulosis     Past Surgical History  Procedure Laterality Date  . Tubal ligation    . Esophagogastroduodenoscopy (egd) with esophageal dilation    . Cholecystectomy  1997    [place], [place]    Family History  Problem Relation Age of Onset  . Hypertension Mother   . Diabetes Mother   . Diverticulosis Mother   . Alcohol abuse Father   . Diverticulosis Maternal Aunt   . Diverticulosis Maternal Grandmother     Social History:  reports that she quit smoking about 3 months ago. Her smoking use included Cigarettes. She smoked 0.50 packs per day. She has never used smokeless tobacco. She reports that she does not drink alcohol or use illicit drugs.  Allergies:  Allergies  Allergen Reactions  . Penicillins Hives    Medications reviewed.    ROS    BP 145/93 mmHg  Pulse 86  Temp(Src) 97.9 F (36.6 C) (Oral)  Wt 245 lb (111.131 kg)  Physical Exam her abdomen is soft nontender with no rebound guarding. She has active bowel sounds. Her lungs are clear.2     No results found for this or any previous visit (from the past 48 hour(s)). No results found.  Assessment/Plan:  1. Diverticulitis of colon I am still concerned because she did not appear to have that much inflammatory change on her CT scan and her symptoms have been pretty pronounced. She is set up to see Dr. Allen Norris next week to consider an  EGD and we will ask him to consider a colonoscopy. The colonoscopy does demonstrate significant diverticulosis and we can't pinpoint exactly where her problem lies and we will talk to her about the potential for surgical intervention. She is in agreement. - Ambulatory referral to Gastroenterology     Dia Crawford III  08/10/2015,negative

## 2015-08-10 NOTE — Patient Instructions (Signed)
You have an appointment with Dr Allen Norris on 08/15/15 at 200 pm in the [place] office. Follow up with our office after treatment by Dr Allen Norris.

## 2015-08-11 ENCOUNTER — Encounter: Payer: Medicare Other | Admitting: Family Medicine

## 2015-08-14 DIAGNOSIS — Z23 Encounter for immunization: Secondary | ICD-10-CM | POA: Diagnosis not present

## 2015-08-15 ENCOUNTER — Telehealth: Payer: Self-pay

## 2015-08-15 ENCOUNTER — Encounter: Payer: Self-pay | Admitting: Gastroenterology

## 2015-08-15 ENCOUNTER — Ambulatory Visit (INDEPENDENT_AMBULATORY_CARE_PROVIDER_SITE_OTHER): Payer: Medicare Other | Admitting: Gastroenterology

## 2015-08-15 VITALS — BP 152/92 | HR 97 | Temp 98.1°F | Ht 67.0 in | Wt 245.0 lb

## 2015-08-15 DIAGNOSIS — K5732 Diverticulitis of large intestine without perforation or abscess without bleeding: Secondary | ICD-10-CM | POA: Diagnosis not present

## 2015-08-15 DIAGNOSIS — R1314 Dysphagia, pharyngoesophageal phase: Secondary | ICD-10-CM

## 2015-08-15 DIAGNOSIS — F250 Schizoaffective disorder, bipolar type: Secondary | ICD-10-CM | POA: Diagnosis not present

## 2015-08-15 MED ORDER — NA SULFATE-K SULFATE-MG SULF 17.5-3.13-1.6 GM/177ML PO SOLN: 1.0000 | ORAL | Status: DC

## 2015-08-15 NOTE — Patient Instructions (Signed)
We will be contacting you to schedule your colonoscopy and EGD.

## 2015-08-15 NOTE — Progress Notes (Signed)
Gastroenterology Consultation  Referring Provider:     Ashok Norris, MD Primary Care Physician:  Ashok Norris, MD Primary Gastroenterologist:  Dr. Allen Norris     Reason for Consultation:     Dysphagia and diverticulitis        HPI:   Elizabeth [name] is a 49 y.o. y/o female referred for consultation & management of dysphagia and diverticulitis by Dr. Ashok Norris, MD.  This patient comes today after seeing Dr. Pat Patrick for diverticulitis. The patient had elected not to undergo any surgery since it was her first time of having diverticulitis. The patient reports that she was given antibiotics again on Thursday and K she has another attack of diverticulitis. The patient also reports that she has been having dysphagia. This has been going on for about a month. The patient had dysphagia last year and was treated in [facility] with a upper endoscopy with dilation. The patient states she had been doing well up until recently and now she states that she has some solid foods that stick on the way down. There is no report of any unexplained weight loss and in fact the patient states she has gained weight.  The patient reports that she has never had diverticulitis the past or to these episodes. She also denies any family history or personal history of colon cancer colon polyps. She also has never had a colonoscopy. There is no report of any black stools or bloody stools. She also denies any nausea vomiting. The patient has never had any of the food sticking in her esophagus that required her to have atrial to emergency room.  Past Medical History  Diagnosis Date  . Irritable bowel syndrome   . RA (rheumatoid arthritis)   . Bipolar 1 disorder   . Insomnia   . GERD (gastroesophageal reflux disease)   . Diverticulosis     Past Surgical History  Procedure Laterality Date  . Tubal ligation    . Esophagogastroduodenoscopy (egd) with esophageal dilation    . Cholecystectomy  1997   [place], [place]    Prior to Admission medications   Medication Sig Start Date End Date Taking? Authorizing Provider  aspirin EC 325 MG tablet Take 325 mg by mouth daily.   Yes Historical Provider, MD  B-D INS SYR ULTRAFINE 1CC/31G 31G X 5/16" 1 ML MISC use to inject METHOTREXATE 05/25/15  Yes Historical Provider, MD  Cholecalciferol (VITAMIN D3) 2000 UNITS capsule Take 2,000 Units by mouth daily.   Yes Historical Provider, MD  ciprofloxacin (CIPRO) 500 MG tablet Take 1 tablet (500 mg total) by mouth 2 (two) times daily. 08/10/15  Yes Dia Crawford III, MD  clindamycin (CLEOCIN) 300 MG capsule Take 1 capsule (300 mg total) by mouth 3 (three) times daily. 08/10/15  Yes Dia Crawford III, MD  folic acid (FOLVITE) 1 MG tablet Take 1 mg by mouth daily. 04/12/15  Yes Historical Provider, MD  Homeopathic Products ([place]) SOLN Apply to eye.   Yes Historical Provider, MD  lansoprazole (PREVACID) 30 MG capsule take 1 capsule by mouth once daily 30 MINUTES PRIOR TO BREAKFAST 05/17/15  Yes Historical Provider, MD  LINZESS 290 MCG CAPS capsule Take 290 mcg by mouth every morning. 04/12/15  Yes Historical Provider, MD  methotrexate 50 MG/2ML injection inject 0.8 milliliter subcutaneously every week 05/25/15  Yes Historical Provider, MD  OLANZapine (ZYPREXA) 15 MG tablet Take 15 mg by mouth at bedtime.  05/16/15  Yes Historical Provider, MD  traZODone (  DESYREL) 100 MG tablet Take 100 mg by mouth at bedtime.   Yes Historical Provider, MD    Family History  Problem Relation Age of Onset  . Hypertension Mother   . Diabetes Mother   . Diverticulosis Mother   . Alcohol abuse Father   . Diverticulosis Maternal Aunt   . Diverticulosis Maternal Grandmother      Social History  Substance Use Topics  . Smoking status: Former Smoker -- 0.50 packs/day    Types: Cigarettes    Quit date: 05/06/2015  . Smokeless tobacco: Never Used  . Alcohol Use: No    Allergies as of 08/15/2015 - Review Complete 08/15/2015    Allergen Reaction Noted  . Penicillins Hives 05/07/2015    Review of Systems:    All systems reviewed and negative except where noted in HPI.   Physical Exam:  BP 152/92 mmHg  Pulse 97  Temp(Src) 98.1 F (36.7 C) (Oral)  Ht 5\' 7"  (1.702 m)  Wt 245 lb (111.131 kg)  BMI 38.36 kg/m2 No LMP recorded. Patient is not currently having periods (Reason: Premenopausal). Psych:  Alert and cooperative. Normal mood and affect. General:   Alert,  Well-developed, obese, well-nourished, pleasant and cooperative in NAD Head:  Normocephalic and atraumatic. Eyes:  Sclera clear, no icterus.   Conjunctiva pink. Ears:  Normal auditory acuity. Nose:  No deformity, discharge, or lesions. Mouth:  No deformity or lesions,oropharynx pink & moist. Neck:  Supple; no masses or thyromegaly. Lungs:  Respirations even and unlabored.  Clear throughout to auscultation.   No wheezes, crackles, or rhonchi. No acute distress. Heart:  Regular rate and rhythm; no murmurs, clicks, rubs, or gallops. Abdomen:  Normal bowel sounds.  No bruits.  Soft, non-tender and non-distended without masses, hepatosplenomegaly or hernias noted.  No guarding or rebound tenderness.  Negative Carnett sign.   Rectal:  Deferred.  Msk:  Symmetrical without gross deformities.  Good, equal movement & strength bilaterally. Pulses:  Normal pulses noted. Extremities:  No clubbing or edema.  No cyanosis. Neurologic:  Alert and oriented x3;  grossly normal neurologically. Skin:  Intact without significant lesions or rashes.  No jaundice. Lymph Nodes:  No significant cervical adenopathy. Psych:  Alert and cooperative. Normal mood and affect.  Imaging Studies: No results found.  Assessment and Plan:   Elizabeth [name] is a 49 y.o. y/o female who has a history of having diverticulitis recently. The patient was treated with antiemetics and denies any abdominal pain present time. The patient was recommended to undergo a colonoscopy due to her  diverticulitis and an upper endoscopy due to her dysphagia. The patient will be set up for an EGD and colonoscopy. I think it is prudent to wait until the diverticulitis has completely healed prior to doing a colonoscopy on this patient. So a colonoscopy and EGD will be planned for 4 weeks from now. I have discussed risks & benefits which include, but are not limited to, bleeding, infection, perforation & drug reaction.  The patient agrees with this plan & written consent will be obtained.      Note: This dictation was prepared with Dragon dictation along with smaller phrase technology. Any transcriptional errors that result from this process are unintentional.

## 2015-08-15 NOTE — Telephone Encounter (Signed)
Can you please schedule a Diagnostic EGD and Colonoscopy for patient at [facility]. It needs to be scheduled in a month. Patient has Diverticulitis. I gave her the paperwork and explained instructions. Prescription was sent to her pharmacy. Thank you.

## 2015-08-16 ENCOUNTER — Other Ambulatory Visit: Payer: Self-pay

## 2015-08-16 NOTE — Telephone Encounter (Signed)
Pt scheduled for EGD/Colonoscopy at [facility] on Thurs, Sept 29th.

## 2015-08-18 DIAGNOSIS — H43393 Other vitreous opacities, bilateral: Secondary | ICD-10-CM | POA: Diagnosis not present

## 2015-08-18 DIAGNOSIS — H04123 Dry eye syndrome of bilateral lacrimal glands: Secondary | ICD-10-CM | POA: Diagnosis not present

## 2015-08-18 DIAGNOSIS — H524 Presbyopia: Secondary | ICD-10-CM | POA: Diagnosis not present

## 2015-09-04 ENCOUNTER — Encounter: Payer: Self-pay | Admitting: *Deleted

## 2015-09-05 NOTE — Discharge Instructions (Signed)

## 2015-09-07 ENCOUNTER — Ambulatory Visit: Payer: Medicare Other | Admitting: Anesthesiology

## 2015-09-07 ENCOUNTER — Ambulatory Visit
Admission: RE | Admit: 2015-09-07 | Discharge: 2015-09-07 | Disposition: A | Discharge status: Home / Self Care | Payer: Medicare Other | Source: Ambulatory Visit | Attending: Gastroenterology | Admitting: Gastroenterology

## 2015-09-07 ENCOUNTER — Encounter
Admission: RE | Disposition: A | Discharge status: Home / Self Care | Payer: Self-pay | Source: Ambulatory Visit | Attending: Gastroenterology

## 2015-09-07 ENCOUNTER — Other Ambulatory Visit: Payer: Self-pay | Admitting: Gastroenterology

## 2015-09-07 DIAGNOSIS — K5730 Diverticulosis of large intestine without perforation or abscess without bleeding: Secondary | ICD-10-CM | POA: Insufficient documentation

## 2015-09-07 DIAGNOSIS — Z1211 Encounter for screening for malignant neoplasm of colon: Secondary | ICD-10-CM | POA: Insufficient documentation

## 2015-09-07 DIAGNOSIS — Z8619 Personal history of other infectious and parasitic diseases: Secondary | ICD-10-CM | POA: Diagnosis not present

## 2015-09-07 DIAGNOSIS — R1314 Dysphagia, pharyngoesophageal phase: Secondary | ICD-10-CM | POA: Diagnosis not present

## 2015-09-07 DIAGNOSIS — K219 Gastro-esophageal reflux disease without esophagitis: Secondary | ICD-10-CM | POA: Insufficient documentation

## 2015-09-07 DIAGNOSIS — Z7982 Long term (current) use of aspirin: Secondary | ICD-10-CM | POA: Diagnosis not present

## 2015-09-07 DIAGNOSIS — G4700 Insomnia, unspecified: Secondary | ICD-10-CM | POA: Insufficient documentation

## 2015-09-07 DIAGNOSIS — F319 Bipolar disorder, unspecified: Secondary | ICD-10-CM | POA: Diagnosis not present

## 2015-09-07 DIAGNOSIS — Z833 Family history of diabetes mellitus: Secondary | ICD-10-CM | POA: Diagnosis not present

## 2015-09-07 DIAGNOSIS — R011 Cardiac murmur, unspecified: Secondary | ICD-10-CM | POA: Insufficient documentation

## 2015-09-07 DIAGNOSIS — M069 Rheumatoid arthritis, unspecified: Secondary | ICD-10-CM | POA: Insufficient documentation

## 2015-09-07 DIAGNOSIS — D124 Benign neoplasm of descending colon: Secondary | ICD-10-CM | POA: Insufficient documentation

## 2015-09-07 DIAGNOSIS — Z8249 Family history of ischemic heart disease and other diseases of the circulatory system: Secondary | ICD-10-CM | POA: Insufficient documentation

## 2015-09-07 DIAGNOSIS — Z8379 Family history of other diseases of the digestive system: Secondary | ICD-10-CM | POA: Insufficient documentation

## 2015-09-07 DIAGNOSIS — Z79899 Other long term (current) drug therapy: Secondary | ICD-10-CM | POA: Insufficient documentation

## 2015-09-07 DIAGNOSIS — Z880 Allergy status to penicillin: Secondary | ICD-10-CM | POA: Diagnosis not present

## 2015-09-07 DIAGNOSIS — K589 Irritable bowel syndrome without diarrhea: Secondary | ICD-10-CM | POA: Diagnosis not present

## 2015-09-07 DIAGNOSIS — Z811 Family history of alcohol abuse and dependence: Secondary | ICD-10-CM | POA: Diagnosis not present

## 2015-09-07 DIAGNOSIS — Z9049 Acquired absence of other specified parts of digestive tract: Secondary | ICD-10-CM | POA: Diagnosis not present

## 2015-09-07 DIAGNOSIS — R1310 Dysphagia, unspecified: Secondary | ICD-10-CM | POA: Diagnosis not present

## 2015-09-07 DIAGNOSIS — D125 Benign neoplasm of sigmoid colon: Secondary | ICD-10-CM | POA: Insufficient documentation

## 2015-09-07 DIAGNOSIS — K5732 Diverticulitis of large intestine without perforation or abscess without bleeding: Secondary | ICD-10-CM | POA: Diagnosis not present

## 2015-09-07 DIAGNOSIS — Z87891 Personal history of nicotine dependence: Secondary | ICD-10-CM | POA: Diagnosis not present

## 2015-09-07 HISTORY — DX: Cardiac murmur, unspecified: R01.1

## 2015-09-07 HISTORY — PX: COLONOSCOPY WITH PROPOFOL: SHX5780

## 2015-09-07 HISTORY — DX: Inflammatory liver disease, unspecified: K75.9

## 2015-09-07 HISTORY — PX: POLYPECTOMY: SHX5525

## 2015-09-07 HISTORY — DX: Motion sickness, initial encounter: T75.3XXA

## 2015-09-07 HISTORY — PX: ESOPHAGOGASTRODUODENOSCOPY (EGD) WITH PROPOFOL: SHX5813

## 2015-09-07 SURGERY — COLONOSCOPY WITH PROPOFOL
Anesthesia: Monitor Anesthesia Care

## 2015-09-07 MED ORDER — LACTATED RINGERS IV SOLN
INTRAVENOUS | Status: DC
  Administered 2015-09-07: 09:00:00 via INTRAVENOUS

## 2015-09-07 MED ORDER — GLYCOPYRROLATE 0.2 MG/ML IJ SOLN
INTRAMUSCULAR | Status: DC | PRN
  Administered 2015-09-07: 0.2 mg via INTRAVENOUS

## 2015-09-07 MED ORDER — PROPOFOL 10 MG/ML IV BOLUS
INTRAVENOUS | Status: DC | PRN
  Administered 2015-09-07: 20 mg via INTRAVENOUS
  Administered 2015-09-07: 30 mg via INTRAVENOUS
  Administered 2015-09-07: 40 mg via INTRAVENOUS
  Administered 2015-09-07: 20 mg via INTRAVENOUS
  Administered 2015-09-07 (×2): 30 mg via INTRAVENOUS
  Administered 2015-09-07: 20 mg via INTRAVENOUS
  Administered 2015-09-07: 30 mg via INTRAVENOUS
  Administered 2015-09-07: 40 mg via INTRAVENOUS
  Administered 2015-09-07: 20 mg via INTRAVENOUS
  Administered 2015-09-07: 30 mg via INTRAVENOUS
  Administered 2015-09-07: 20 mg via INTRAVENOUS
  Administered 2015-09-07: 40 mg via INTRAVENOUS
  Administered 2015-09-07 (×2): 50 mg via INTRAVENOUS
  Administered 2015-09-07: 20 mg via INTRAVENOUS
  Administered 2015-09-07: 40 mg via INTRAVENOUS
  Administered 2015-09-07: 30 mg via INTRAVENOUS
  Administered 2015-09-07 (×2): 20 mg via INTRAVENOUS

## 2015-09-07 MED ORDER — OXYCODONE HCL 5 MG/5ML PO SOLN: 5.0000 mg | Freq: Once | ORAL | Status: DC | PRN

## 2015-09-07 MED ORDER — OXYCODONE HCL 5 MG PO TABS: 5.0000 mg | ORAL_TABLET | Freq: Once | ORAL | Status: DC | PRN

## 2015-09-07 MED ORDER — LIDOCAINE HCL (CARDIAC) 20 MG/ML IV SOLN
INTRAVENOUS | Status: DC | PRN
  Administered 2015-09-07: 50 mg via INTRAVENOUS

## 2015-09-07 SURGICAL SUPPLY — 39 items
BALLN DILATOR 10-12 8 (BALLOONS) ×2
BALLN DILATOR 12-15 8 (BALLOONS)
BALLN DILATOR 15-18 8 (BALLOONS)
BALLN DILATOR CRE 0-12 8 (BALLOONS) ×1
BALLN DILATOR ESOPH 8 10 CRE (MISCELLANEOUS) IMPLANT
BALLOON DILATOR 12-15 8 (BALLOONS) IMPLANT
BALLOON DILATOR 15-18 8 (BALLOONS) IMPLANT
BALLOON DILATOR CRE 0-12 8 (BALLOONS) ×1 IMPLANT
BLOCK BITE 60FR ADLT L/F GRN (MISCELLANEOUS) ×3 IMPLANT
CANISTER SUCT 1200ML W/VALVE (MISCELLANEOUS) ×3 IMPLANT
FCP ESCP3.2XJMB 240X2.8X (MISCELLANEOUS)
FORCEPS BIOP RAD 4 LRG CAP 4 (CUTTING FORCEPS) IMPLANT
FORCEPS BIOP RJ4 240 W/NDL (MISCELLANEOUS)
FORCEPS ESCP3.2XJMB 240X2.8X (MISCELLANEOUS) IMPLANT
GOWN CVR UNV OPN BCK APRN NK (MISCELLANEOUS) ×2 IMPLANT
GOWN ISOL THUMB LOOP REG UNIV (MISCELLANEOUS) ×4
HEMOCLIP INSTINCT (CLIP) ×3 IMPLANT
INJECTOR VARIJECT VIN23 (MISCELLANEOUS) IMPLANT
KIT CO2 TUBING (TUBING) IMPLANT
KIT DEFENDO VALVE AND CONN (KITS) IMPLANT
KIT ENDO PROCEDURE OLY (KITS) ×3 IMPLANT
LIGATOR MULTIBAND 6SHOOTER MBL (MISCELLANEOUS) IMPLANT
MARKER SPOT ENDO TATTOO 5ML (MISCELLANEOUS) IMPLANT
PAD GROUND ADULT SPLIT (MISCELLANEOUS) ×3 IMPLANT
SNARE SHORT THROW 13M SML OVAL (MISCELLANEOUS) IMPLANT
SNARE SHORT THROW 30M LRG OVAL (MISCELLANEOUS) IMPLANT
SPOT EX ENDOSCOPIC TATTOO (MISCELLANEOUS)
SUCTION POLY TRAP 4CHAMBER (MISCELLANEOUS) IMPLANT
SYR INFLATION 60ML (SYRINGE) IMPLANT
TRAP SUCTION POLY (MISCELLANEOUS) IMPLANT
TUBING CONN 6MMX3.1M (TUBING)
TUBING SUCTION CONN 0.25 STRL (TUBING) IMPLANT
UNDERPAD 30X60 958B10 (PK) (MISCELLANEOUS) IMPLANT
VALVE BIOPSY ENDO (VALVE) IMPLANT
VARIJECT INJECTOR VIN23 (MISCELLANEOUS)
WATER AUXILLARY (MISCELLANEOUS) IMPLANT
WATER STERILE IRR 250ML POUR (IV SOLUTION) ×3 IMPLANT
WATER STERILE IRR 500ML POUR (IV SOLUTION) IMPLANT
WIRE CRE 18-20MM 8CM F G (MISCELLANEOUS) IMPLANT

## 2015-09-07 NOTE — H&P (Signed)
[facility]  [address], [place],  [address] Phone: [phone] Fax : [phone]  Primary Care Physician:  Ashok Norris, MD Primary Gastroenterologist:  Dr. Allen Norris  Pre-Procedure History & Physical: HPI:  Elizabeth [name] is a 49 y.o. female is here for an endoscopy and colonoscopy.   Past Medical History  Diagnosis Date  . Irritable bowel syndrome   . RA (rheumatoid arthritis)   . Bipolar 1 disorder   . Insomnia   . GERD (gastroesophageal reflux disease)   . Diverticulosis   . Motion sickness     boats  . Hepatitis     B - "not active"  . Heart murmur     followed by PCP    Past Surgical History  Procedure Laterality Date  . Tubal ligation    . Esophagogastroduodenoscopy (egd) with esophageal dilation    . Cholecystectomy  1997    [place], [place]    Prior to Admission medications   Medication Sig Start Date End Date Taking? Authorizing Provider  aspirin EC 325 MG tablet Take 325 mg by mouth daily.   Yes Historical Provider, MD  B-D INS SYR ULTRAFINE 1CC/31G 31G X 5/16" 1 ML MISC use to inject METHOTREXATE 05/25/15  Yes Historical Provider, MD  Cholecalciferol (VITAMIN D3) 2000 UNITS capsule Take 2,000 Units by mouth daily.   Yes Historical Provider, MD  folic acid (FOLVITE) 1 MG tablet Take 1 mg by mouth daily. 04/12/15  Yes Historical Provider, MD  Homeopathic Products ([place]) SOLN Apply to eye.   Yes Historical Provider, MD  lansoprazole (PREVACID) 30 MG capsule take 1 capsule by mouth once daily 30 MINUTES PRIOR TO BREAKFAST 05/17/15  Yes Historical Provider, MD  LINZESS 290 MCG CAPS capsule Take 290 mcg by mouth every morning. 04/12/15  Yes Historical Provider, MD  methotrexate 50 MG/2ML injection inject 0.8 milliliter subcutaneously every week 05/25/15  Yes Historical Provider, MD  Na Sulfate-K Sulfate-Mg Sulf (SUPREP BOWEL PREP) SOLN Take 1 kit by mouth as directed. 08/15/15  Yes Darren Allen Norris, MD  OLANZapine (ZYPREXA) 15 MG  tablet Take 15 mg by mouth at bedtime.  05/16/15  Yes Historical Provider, MD  traZODone (DESYREL) 100 MG tablet Take 100 mg by mouth at bedtime.   Yes Historical Provider, MD  ciprofloxacin (CIPRO) 500 MG tablet Take 1 tablet (500 mg total) by mouth 2 (two) times daily. 08/10/15   Dia Crawford III, MD  clindamycin (CLEOCIN) 300 MG capsule Take 1 capsule (300 mg total) by mouth 3 (three) times daily. 08/10/15   Dia Crawford III, MD    Allergies as of 08/16/2015 - Review Complete 08/15/2015  Allergen Reaction Noted  . Penicillins Hives 05/07/2015    Family History  Problem Relation Age of Onset  . Hypertension Mother   . Diabetes Mother   . Diverticulosis Mother   . Alcohol abuse Father   . Diverticulosis Maternal Aunt   . Diverticulosis Maternal Grandmother     Social History   Social History  . Marital Status: Legally Separated    Spouse Name: N/A  . Number of Children: N/A  . Years of Education: N/A   Occupational History  . Not on file.   Social History Main Topics  . Smoking status: Former Smoker -- 0.50 packs/day    Types: Cigarettes    Quit date: 05/06/2015  . Smokeless tobacco: Never Used  . Alcohol Use: No  . Drug Use: No  . Sexual Activity: Yes  Birth Control/ Protection: None   Other Topics Concern  . Not on file   Social History Narrative    Review of Systems: See HPI, otherwise negative ROS  Physical Exam: BP 125/74 mmHg  Pulse 72  Temp(Src) 97.7 F (36.5 C) (Temporal)  Resp 16  Ht '5\' 6"'  (1.676 m)  Wt 239 lb (108.41 kg)  BMI 38.59 kg/m2  SpO2 98%  LMP 07/09/2014 General:   Alert,  pleasant and cooperative in NAD Head:  Normocephalic and atraumatic. Neck:  Supple; no masses or thyromegaly. Lungs:  Clear throughout to auscultation.    Heart:  Regular rate and rhythm. Abdomen:  Soft, nontender and nondistended. Normal bowel sounds, without guarding, and without rebound.   Neurologic:  Alert and  oriented x4;  grossly normal  neurologically.  Impression/Plan: CEOLA PARA is here for an endoscopy and colonoscopy to be performed for history of diverticulitis and dysphagia.  Risks, benefits, limitations, and alternatives regarding  endoscopy and colonoscopy have been reviewed with the patient.  Questions have been answered.  All parties agreeable.   Ollen Bowl, MD  09/07/2015, 8:52 AM

## 2015-09-07 NOTE — Transfer of Care (Signed)
Immediate Anesthesia Transfer of Care Note  Patient: Elizabeth [name]  Procedure(s) Performed: Procedure(s): COLONOSCOPY WITH PROPOFOL (N/A) ESOPHAGOGASTRODUODENOSCOPY (EGD) WITH PROPOFOL (N/A) POLYPECTOMY (N/A)  Patient Location: PACU  Anesthesia Type: MAC  Level of Consciousness: awake, alert  and patient cooperative  Airway and Oxygen Therapy: Patient Spontanous Breathing and Patient connected to supplemental oxygen  Post-op Assessment: Post-op Vital signs reviewed, Patient's Cardiovascular Status Stable, Respiratory Function Stable, Patent Airway and No signs of Nausea or vomiting  Post-op Vital Signs: Reviewed and stable  Complications: No apparent anesthesia complications

## 2015-09-07 NOTE — Anesthesia Procedure Notes (Signed)
Procedure Name: MAC Performed by: AMYOT, MICHAEL Pre-anesthesia Checklist: Patient identified, Emergency Drugs available, Suction available, Timeout performed and Patient being monitored Patient Re-evaluated:Patient Re-evaluated prior to inductionOxygen Delivery Method: Nasal cannula Placement Confirmation: positive ETCO2       

## 2015-09-07 NOTE — Op Note (Signed)
[facility] Gastroenterology Patient Name: Elizabeth [name] Procedure Date: 09/07/2015 9:23 AM MRN: [id number] Account #: [id number] Date of Birth: 21-Sep-1966 Admit Type: Outpatient Age: 49 Room: [facility] OR ROOM 01 Gender: Female Note Status: Finalized Procedure:         Colonoscopy Indications:       Screening for colorectal malignant neoplasm Providers:         Lucilla Lame, MD Medicines:         Propofol per Anesthesia Complications:     No immediate complications. Procedure:         Pre-Anesthesia Assessment:                    - Prior to the procedure, a History and Physical was                     performed, and patient medications and allergies were                     reviewed. The patient's tolerance of previous anesthesia                     was also reviewed. The risks and benefits of the procedure                     and the sedation options and risks were discussed with the                     patient. All questions were answered, and informed consent                     was obtained. Prior Anticoagulants: The patient has taken                     no previous anticoagulant or antiplatelet agents. ASA                     Grade Assessment: II - A patient with mild systemic                     disease. After reviewing the risks and benefits, the                     patient was deemed in satisfactory condition to undergo                     the procedure.                    After obtaining informed consent, the colonoscope was                     passed under direct vision. Throughout the procedure, the                     patient's blood pressure, pulse, and oxygen saturations                     were monitored continuously. The Olympus CF-HQ190L                     Colonoscope (S#. [id number]) was introduced through the anus                     and advanced to the the cecum, identified by appendiceal  orifice and ileocecal valve. The colonoscopy  was performed                     without difficulty. The patient tolerated the procedure                     well. The quality of the bowel preparation was excellent. Findings:      The perianal and digital rectal examinations were normal.      A 4 mm polyp was found in the descending colon. The polyp was sessile.       The polyp was removed with a cold biopsy forceps. Resection and       retrieval were complete.      Two sessile polyps were found in the sigmoid colon. The polyps were 4 to       7 mm in size. These polyps were removed with a cold biopsy forceps.       Resection and retrieval were complete.      A 10 mm polyp was found in the sigmoid colon. The polyp was       pedunculated. The polyp was removed with a hot snare. Resection and       retrieval were complete. One hemostatic clip was successfully placed       (MRI compatible). Bleeding had stopped at the end of the procedure.      A few small-mouthed diverticula were found in the entire colon. Impression:        - One 4 mm polyp in the descending colon. Resected and                     retrieved.                    - Two 4 to 7 mm polyps in the sigmoid colon. Resected and                     retrieved.                    - One 10 mm polyp in the sigmoid colon. Resected and                     retrieved. Clip was placed.                    - Diverticulosis in the entire examined colon. Recommendation:    - Await pathology results.                    - Repeat colonoscopy in 5 years if polyp adenoma and 10                     years if hyperplastic Procedure Code(s): --- Professional ---                    [phone], Colonoscopy, flexible; with removal of tumor(s),                     polyp(s), or other lesion(s) by snare technique                    54008, 63, Colonoscopy, flexible; with biopsy, single or                     multiple Diagnosis Code(s): --- Professional ---  Z12.11, Encounter for screening for  malignant neoplasm of                     colon                    D12.4, Benign neoplasm of descending colon                    D12.5, Benign neoplasm of sigmoid colon CPT copyright 2014 American Medical Association. All rights reserved. The codes documented in this report are preliminary and upon coder review may  be revised to meet current compliance requirements. Lucilla Lame, MD 09/07/2015 9:56:23 AM This report has been signed electronically. Number of Addenda: 0 Note Initiated On: 09/07/2015 9:23 AM Scope Withdrawal Time: 0 hours 9 minutes 44 seconds  Total Procedure Duration: 0 hours 12 minutes 25 seconds       [facility]

## 2015-09-07 NOTE — Op Note (Signed)
[facility] Gastroenterology Patient Name: Elizabeth [name] Procedure Date: 09/07/2015 9:23 AM MRN: [id number] Account #: [id number] Date of Birth: [birth year] Admit Type: Outpatient Age: 49 Room: [facility] OR ROOM 01 Gender: Female Note Status: Finalized Procedure:         Upper GI endoscopy Indications:       Dysphagia Providers:         Lucilla Lame, MD Referring MD:      Ashok Norris, MD (Referring MD) Medicines:         Propofol per Anesthesia Complications:     No immediate complications. Procedure:         Pre-Anesthesia Assessment:                    - Prior to the procedure, a History and Physical was                     performed, and patient medications and allergies were                     reviewed. The patient's tolerance of previous anesthesia                     was also reviewed. The risks and benefits of the procedure                     and the sedation options and risks were discussed with the                     patient. All questions were answered, and informed consent                     was obtained. Prior Anticoagulants: The patient has taken                     no previous anticoagulant or antiplatelet agents. ASA                     Grade Assessment: II - A patient with mild systemic                     disease. After reviewing the risks and benefits, the                     patient was deemed in satisfactory condition to undergo                     the procedure.                    After obtaining informed consent, the endoscope was passed                     under direct vision. Throughout the procedure, the                     patient's blood pressure, pulse, and oxygen saturations                     were monitored continuously. The Olympus [id number]-HQ190                     Endoscope (S#. [id number]) was introduced through the mouth,  and advanced to the second part of duodenum. The upper GI                     endoscopy was  accomplished without difficulty. The patient                     tolerated the procedure well. Findings:      The examined esophagus was normal. Biopsies were taken with a cold       forceps for histology. The scope was withdrawn. Dilation was attempted,       but the lesion was not amenable to treatment with a Maloney dilator       because no resistance at 78 Fr. The dilation site was examined following       endoscope reinsertion and showed no change. Two biopsies were obtained       in the middle third of the esophagus with cold forceps for histology.      The stomach was normal.      The examined duodenum was normal. Impression:        - Normal esophagus. Biopsied. Unable to dilate.                    - Normal stomach.                    - Normal examined duodenum.                    - Two biopsies were obtained in the middle third of the                     esophagus. Recommendation:    - Perform a colonoscopy today. Procedure Code(s): --- Professional ---                    [id number], Esophagogastroduodenoscopy, flexible, transoral;                     with biopsy, single or multiple                    43450, Dilation of esophagus, by unguided sound or bougie,                     single or multiple passes Diagnosis Code(s): --- Professional ---                    R13.10, Dysphagia, unspecified CPT copyright 2014 American Medical Association. All rights reserved. The codes documented in this report are preliminary and upon coder review may  be revised to meet current compliance requirements. Lucilla Lame, MD 09/07/2015 9:39:58 AM This report has been signed electronically. Number of Addenda: 0 Note Initiated On: 09/07/2015 9:23 AM Total Procedure Duration: 0 hours 3 minutes 47 seconds       [facility]

## 2015-09-07 NOTE — Anesthesia Postprocedure Evaluation (Signed)
  Anesthesia Post-op Note  Patient: Elizabeth [name]  Procedure(s) Performed: Procedure(s): COLONOSCOPY WITH PROPOFOL (N/A) ESOPHAGOGASTRODUODENOSCOPY (EGD) WITH PROPOFOL (N/A) POLYPECTOMY (N/A)  Anesthesia type:MAC  Patient location: PACU  Post pain: Pain level controlled  Post assessment: Post-op Vital signs reviewed, Patient's Cardiovascular Status Stable, Respiratory Function Stable, Patent Airway and No signs of Nausea or vomiting  Post vital signs: Reviewed and stable  Last Vitals:  Filed Vitals:   09/07/15 1010  BP: 114/73  Pulse: 83  Temp:   Resp: 19    Level of consciousness: awake, alert  and patient cooperative  Complications: No apparent anesthesia complications

## 2015-09-07 NOTE — Anesthesia Preprocedure Evaluation (Addendum)
Anesthesia Evaluation   Patient awake    Reviewed: Allergy & Precautions, H&P   History of Anesthesia Complications Negative for: history of anesthetic complications  Airway Mallampati: II  TM Distance: >3 FB Neck ROM: full    Dental no notable dental hx.    Pulmonary former smoker,    Pulmonary exam normal        Cardiovascular negative cardio ROS Normal cardiovascular exam     Neuro/Psych negative neurological ROS     GI/Hepatic GERD  Controlled,  Endo/Other  negative endocrine ROS  Renal/GU negative Renal ROS  negative genitourinary   Musculoskeletal   Abdominal   Peds  Hematology negative hematology ROS (+)   Anesthesia Other Findings   Reproductive/Obstetrics                           Anesthesia Physical Anesthesia Plan  ASA: II  Anesthesia Plan: MAC   Post-op Pain Management:    Induction:   Airway Management Planned:   Additional Equipment:   Intra-op Plan:   Post-operative Plan:   Informed Consent: I have reviewed the patients History and Physical, chart, labs and discussed the procedure including the risks, benefits and alternatives for the proposed anesthesia with the patient or authorized representative who has indicated his/her understanding and acceptance.     Plan Discussed with: CRNA  Anesthesia Plan Comments:         Anesthesia Quick Evaluation

## 2015-09-12 DIAGNOSIS — F250 Schizoaffective disorder, bipolar type: Secondary | ICD-10-CM | POA: Diagnosis not present

## 2015-09-13 ENCOUNTER — Telehealth: Payer: Self-pay | Admitting: Gastroenterology

## 2015-09-13 NOTE — Telephone Encounter (Signed)
Patient calling about colon results

## 2015-09-14 NOTE — Telephone Encounter (Signed)
Called pt to inform her Dr. Allen Norris is out of the office and will be available on Monday. Pathology has been scanned into pt's chart.

## 2015-09-18 DIAGNOSIS — M7062 Trochanteric bursitis, left hip: Secondary | ICD-10-CM | POA: Diagnosis not present

## 2015-09-18 DIAGNOSIS — M7061 Trochanteric bursitis, right hip: Secondary | ICD-10-CM | POA: Diagnosis not present

## 2015-09-18 DIAGNOSIS — M2241 Chondromalacia patellae, right knee: Secondary | ICD-10-CM | POA: Diagnosis not present

## 2015-09-19 ENCOUNTER — Telehealth: Payer: Self-pay

## 2015-09-19 NOTE — Telephone Encounter (Signed)
-----   Message from Lucilla Lame, MD sent at 09/19/2015  7:24 AM EDT ----- Let patient know that the polyp was adenomatous and a repeat colonoscopy will be needed in 5 years and the EGD biopsies were normal. ----- Message -----    From: [place]ie [place], CMA    Sent: 09/14/2015   1:37 PM      To: Lucilla Lame, MD  Please review pt pathology in [facility]. Had to go online to [facility] and print. We have contacted them regarding this.

## 2015-09-19 NOTE — Telephone Encounter (Signed)
LVM for pt to return my call.

## 2015-09-20 ENCOUNTER — Encounter: Payer: Self-pay | Admitting: Family Medicine

## 2015-09-20 ENCOUNTER — Ambulatory Visit (INDEPENDENT_AMBULATORY_CARE_PROVIDER_SITE_OTHER): Payer: Medicare Other | Admitting: Family Medicine

## 2015-09-20 VITALS — BP 118/66 | HR 96 | Temp 98.2°F | Resp 16 | Ht 67.0 in | Wt 238.1 lb

## 2015-09-20 DIAGNOSIS — N760 Acute vaginitis: Secondary | ICD-10-CM | POA: Diagnosis not present

## 2015-09-20 DIAGNOSIS — E119 Type 2 diabetes mellitus without complications: Secondary | ICD-10-CM | POA: Diagnosis not present

## 2015-09-20 DIAGNOSIS — E669 Obesity, unspecified: Secondary | ICD-10-CM | POA: Diagnosis not present

## 2015-09-20 MED ORDER — FLUCONAZOLE 150 MG PO TABS: 150.0000 mg | ORAL_TABLET | Freq: Once | ORAL | Status: DC

## 2015-09-20 MED ORDER — LINAGLIPTIN 5 MG PO TABS: 5.0000 mg | ORAL_TABLET | Freq: Every day | ORAL | Status: DC

## 2015-09-20 MED ORDER — GLUCOSE BLOOD VI STRP: ORAL_STRIP | Status: DC

## 2015-09-20 MED ORDER — ACCU-CHEK MULTICLIX LANCETS MISC: Status: DC

## 2015-09-20 NOTE — Progress Notes (Signed)
Name: Elizabeth [name]   MRN: [id number]    DOB: [birth year]   Date:09/20/2015       Progress Note  Subjective  Chief Complaint  Chief Complaint  Patient presents with  . Annual Exam    HPI  Patient presents today for an annual physical with me because her primary care is booked and she was actually accidentally overbooked. However I currently have a cast on the right arm and unable to perform the entire exam. She wishes to have the vaginal area looked and externally as she is having itching and recurrent episodes of urination. This is been having over several week.  Obesity  Long-standing history of obesity. There is a definite history of diabetes and further questioned there is also been polyuria of recent. Patient's weight is 238 today is actually decreased 7 pounds past 2 months making suspicion of diabetes he likely diagnosis in view of her vaginitis and weight loss.  Past Medical History  Diagnosis Date  . Irritable bowel syndrome   . RA (rheumatoid arthritis) ([place])   . Bipolar 1 disorder ([place])   . Insomnia   . GERD (gastroesophageal reflux disease)   . Diverticulosis   . Motion sickness     boats  . Hepatitis     B - "not active"  . Heart murmur     followed by PCP    Social History  Substance Use Topics  . Smoking status: Former Smoker -- 0.50 packs/day    Types: Cigarettes    Quit date: 05/06/2015  . Smokeless tobacco: Never Used  . Alcohol Use: No     Current outpatient prescriptions:  .  aspirin EC 325 MG tablet, Take 325 mg by mouth daily., Disp: , Rfl:  .  B-D INS SYR ULTRAFINE 1CC/31G 31G X 5/16" 1 ML MISC, use to inject METHOTREXATE, Disp: , Rfl: 0 .  Cholecalciferol (VITAMIN D3) 2000 UNITS capsule, Take 2,000 Units by mouth daily., Disp: , Rfl:  .  clindamycin (CLEOCIN) 300 MG capsule, Take 1 capsule (300 mg total) by mouth 3 (three) times daily., Disp: 30 capsule, Rfl: 1 .  fluconazole (DIFLUCAN) 150 MG tablet, Take 1 tablet (150 mg total) by mouth once.,  Disp: 1 tablet, Rfl: 0 .  folic acid (FOLVITE) 1 MG tablet, Take 1 mg by mouth daily., Disp: , Rfl: 0 .  glucose blood (ACCU-CHEK ACTIVE STRIPS) test strip, Use as instructed, Disp: 100 each, Rfl: 12 .  Homeopathic Products (SIMILASAN DRY EYE RELIEF) SOLN, Apply to eye., Disp: , Rfl:  .  Lancets (ACCU-CHEK MULTICLIX) lancets, Use as instructed, Disp: 100 each, Rfl: 12 .  lansoprazole (PREVACID) 30 MG capsule, take 1 capsule by mouth once daily 30 MINUTES PRIOR TO BREAKFAST, Disp: , Rfl: 1 .  linagliptin (TRADJENTA) 5 MG TABS tablet, Take 1 tablet (5 mg total) by mouth daily., Disp: 30 tablet, Rfl: 3 .  LINZESS 290 MCG CAPS capsule, Take 290 mcg by mouth every morning., Disp: , Rfl: 1 .  meloxicam (MOBIC) 15 MG tablet, Take 15 mg by mouth daily., Disp: , Rfl: 0 .  methotrexate 50 MG/2ML injection, inject 0.8 milliliter subcutaneously every week, Disp: , Rfl: 0 .  Na Sulfate-K Sulfate-Mg Sulf (SUPREP BOWEL PREP) SOLN, Take 1 kit by mouth as directed., Disp: 1 Bottle, Rfl: 0 .  OLANZapine (ZYPREXA) 15 MG tablet, Take 15 mg by mouth at bedtime. , Disp: , Rfl: 0 .  ondansetron (ZOFRAN) 4 MG tablet, take 1 tablet by mouth every  8 hours if needed for nausea and vomiting, Disp: , Rfl: 0 .  traZODone (DESYREL) 100 MG tablet, Take 100 mg by mouth at bedtime., Disp: , Rfl:   Allergies  Allergen Reactions  . Penicillins Hives    Review of Systems  Constitutional: Negative for fever, chills and weight loss.  HENT: Negative for congestion, hearing loss, sore throat and tinnitus.   Eyes: Positive for blurred vision. Negative for double vision and redness.  Respiratory: Negative for cough, hemoptysis and shortness of breath.   Cardiovascular: Negative for chest pain, palpitations, orthopnea, claudication and leg swelling.  Gastrointestinal: Negative for heartburn, nausea, vomiting, diarrhea, constipation and blood in stool.  Genitourinary: Positive for urgency and frequency. Negative for dysuria,  hematuria and flank pain.  Musculoskeletal: Negative for myalgias, back pain, joint pain, falls and neck pain.  Skin: Positive for itching.  Neurological: Negative for dizziness, tingling, tremors, focal weakness, seizures, loss of consciousness, weakness and headaches.  Endo/Heme/Allergies: Does not bruise/bleed easily.  Psychiatric/Behavioral: Negative for depression and substance abuse. The patient is not nervous/anxious and does not have insomnia.      Objective  Filed Vitals:   09/20/15 0947  BP: 118/66  Pulse: 96  Temp: 98.2 F (36.8 C)  Resp: 16  Height: _0  (1.702 m)  Weight: 238 lb 1 oz (107.984 kg)  SpO2: 97%     Physical Exam  Constitutional: She is oriented to person, place, and time.  Obese female in no acute distress  HENT:  Head: Normocephalic.  Eyes: Pupils are equal, round, and reactive to light.  Neck: Neck supple. No thyromegaly present.  Cardiovascular: Normal rate and regular rhythm.   Pulmonary/Chest: Effort normal and breath sounds normal.  Genitourinary:  Labia minora and majora reveals erythema with some minimal discharge present.   Musculoskeletal: She exhibits no edema.  Neurological: She is alert and oriented to person, place, and time.  Vitals reviewed.     Assessment & Plan   1. New onset type 2 diabetes mellitus ([place])  Return for recheck in 1 month. Primary care. Consideration may be given for pain fructosamine which gives a one-month evaluation of her glucose 10 mg of NovoLog is given today to offset some of her glucose toxicity - Comprehensive Metabolic Panel (CMET) - Lipid Profile - C-peptide - TSH - Fructosamine - Ambulatory referral to diabetic education - linagliptin (TRADJENTA) 5 MG TABS tablet; Take 1 tablet (5 mg total) by mouth daily.  Dispense: 30 tablet; Refill: 3 - Lancets (ACCU-CHEK MULTICLIX) lancets; Use as instructed  Dispense: 100 each; Refill: 12 - glucose blood (ACCU-CHEK ACTIVE STRIPS) test strip; Use as  instructed  Dispense: 100 each; Refill: 12  2. Vaginitis Secondary to her new onset diabetes - fluconazole (DIFLUCAN) 150 MG tablet; Take 1 tablet (150 mg total) by mouth once.  Dispense: 1 tablet; Refill: 0   3. Obesity Referred to [facility] Center

## 2015-09-20 NOTE — Patient Instructions (Signed)
Diabetes Mellitus and Food It is important for you to manage your blood sugar (glucose) level. Your blood glucose level can be greatly affected by what you eat. Eating healthier foods in the appropriate amounts throughout the day at about the same time each day will help you control your blood glucose level. It can also help slow or prevent worsening of your diabetes mellitus. Healthy eating may even help you improve the level of your blood pressure and reach or maintain a healthy weight.  General recommendations for healthful eating and cooking habits include:  Eating meals and snacks regularly. Avoid going long periods of time without eating to lose weight.  Eating a diet that consists mainly of plant-based foods, such as fruits, vegetables, nuts, legumes, and whole grains.  Using low-heat cooking methods, such as baking, instead of high-heat cooking methods, such as deep frying. Work with your dietitian to make sure you understand how to use the Nutrition Facts information on food labels. HOW CAN FOOD AFFECT ME? Carbohydrates Carbohydrates affect your blood glucose level more than any other type of food. Your dietitian will help you determine how many carbohydrates to eat at each meal and teach you how to count carbohydrates. Counting carbohydrates is important to keep your blood glucose at a healthy level, especially if you are using insulin or taking certain medicines for diabetes mellitus. Alcohol Alcohol can cause sudden decreases in blood glucose (hypoglycemia), especially if you use insulin or take certain medicines for diabetes mellitus. Hypoglycemia can be a life-threatening condition. Symptoms of hypoglycemia (sleepiness, dizziness, and disorientation) are similar to symptoms of having too much alcohol.  If your health care provider has given you approval to drink alcohol, do so in moderation and use the following guidelines:  Women should not have more than one drink per day, and men  should not have more than two drinks per day. One drink is equal to:  12 oz of beer.  5 oz of wine.  1 oz of hard liquor.  Do not drink on an empty stomach.  Keep yourself hydrated. Have water, diet soda, or unsweetened iced tea.  Regular soda, juice, and other mixers might contain a lot of carbohydrates and should be counted. WHAT FOODS ARE NOT RECOMMENDED? As you make food choices, it is important to remember that all foods are not the same. Some foods have fewer nutrients per serving than other foods, even though they might have the same number of calories or carbohydrates. It is difficult to get your body what it needs when you eat foods with fewer nutrients. Examples of foods that you should avoid that are high in calories and carbohydrates but low in nutrients include:  Trans fats (most processed foods list trans fats on the Nutrition Facts label).  Regular soda.  Juice.  Candy.  Sweets, such as cake, pie, doughnuts, and cookies.  Fried foods. WHAT FOODS CAN I EAT? Eat nutrient-rich foods, which will nourish your body and keep you healthy. The food you should eat also will depend on several factors, including:  The calories you need.  The medicines you take.  Your weight.  Your blood glucose level.  Your blood pressure level.  Your cholesterol level. You should eat a variety of foods, including:  Protein.  Lean cuts of meat.  Proteins low in saturated fats, such as fish, egg whites, and beans. Avoid processed meats.  Fruits and vegetables.  Fruits and vegetables that may help control blood glucose levels, such as apples, mangoes, and   yams.  Dairy products.  Choose fat-free or low-fat dairy products, such as milk, yogurt, and cheese.  Grains, bread, pasta, and rice.  Choose whole grain products, such as multigrain bread, whole oats, and brown rice. These foods may help control blood pressure.  Fats.  Foods containing healthful fats, such as nuts,  avocado, olive oil, canola oil, and fish. DOES EVERYONE WITH DIABETES MELLITUS HAVE THE SAME MEAL PLAN? Because every person with diabetes mellitus is different, there is not one meal plan that works for everyone. It is very important that you meet with a dietitian who will help you create a meal plan that is just right for you.   This information is not intended to replace advice given to you by your health care provider. Make sure you discuss any questions you have with your health care provider.   Document Released: 08/22/2005 Document Revised: 12/16/2014 Document Reviewed: 10/22/2013 Elsevier Interactive Patient Education 2016 [employer] Inc.  

## 2015-09-21 ENCOUNTER — Other Ambulatory Visit: Payer: Self-pay

## 2015-09-21 LAB — COMPREHENSIVE METABOLIC PANEL
ALT: 41 IU/L — AB (ref 0–32)
AST: 17 IU/L (ref 0–40)
Albumin/Globulin Ratio: 1.6 (ref 1.1–2.5)
Albumin: 4.8 g/dL (ref 3.5–5.5)
Alkaline Phosphatase: 129 IU/L — ABNORMAL HIGH (ref 39–117)
BUN/Creatinine Ratio: 19 (ref 9–23)
BUN: 17 mg/dL (ref 6–24)
Bilirubin Total: 0.4 mg/dL (ref 0.0–1.2)
CALCIUM: 10.1 mg/dL (ref 8.7–10.2)
CO2: 24 mmol/L (ref 18–29)
Chloride: 97 mmol/L (ref 97–108)
Creatinine, Ser: 0.88 mg/dL (ref 0.57–1.00)
GFR, EST AFRICAN AMERICAN: 90 mL/min/{1.73_m2} (ref >59)
GFR, EST NON AFRICAN AMERICAN: 78 mL/min/{1.73_m2} (ref >59)
GLUCOSE: 417 mg/dL — AB (ref 65–99)
Globulin, Total: 3 g/dL (ref 1.5–4.5)
POTASSIUM: 4.3 mmol/L (ref 3.5–5.2)
Sodium: 139 mmol/L (ref 134–144)
TOTAL PROTEIN: 7.8 g/dL (ref 6.0–8.5)

## 2015-09-21 LAB — FRUCTOSAMINE: FRUCTOSAMINE: 492 umol/L — AB (ref 0–285)

## 2015-09-21 LAB — LIPID PANEL
CHOL/HDL RATIO: 4.7 ratio — AB (ref 0.0–4.4)
Cholesterol, Total: 202 mg/dL — ABNORMAL HIGH (ref 100–199)
HDL: 43 mg/dL (ref >39)
LDL CALC: 121 mg/dL — AB (ref 0–99)
Triglycerides: 192 mg/dL — ABNORMAL HIGH (ref 0–149)
VLDL CHOLESTEROL CAL: 38 mg/dL (ref 5–40)

## 2015-09-21 LAB — C-PEPTIDE: C-Peptide: 4.8 ng/mL — ABNORMAL HIGH (ref 1.1–4.4)

## 2015-09-21 LAB — TSH: TSH: 0.754 u[IU]/mL (ref 0.450–4.500)

## 2015-09-21 MED ORDER — METFORMIN HCL 1000 MG PO TABS: 1000.0000 mg | ORAL_TABLET | Freq: Two times a day (BID) | ORAL | Status: DC

## 2015-09-21 NOTE — Telephone Encounter (Signed)
-----   Message from Lucilla Lame, MD sent at 09/19/2015  7:24 AM EDT ----- Let patient know that the polyp was adenomatous and a repeat colonoscopy will be needed in 5 years and the EGD biopsies were normal. ----- Message -----    From: Glennie [place], CMA    Sent: 09/14/2015   1:37 PM      To: Lucilla Lame, MD  Please review pt pathology in [facility]. Had to go online to [facility] and print. We have contacted them regarding this.

## 2015-09-21 NOTE — Telephone Encounter (Signed)
Pt returned call regarding pathology and results were given. Pt was scheduled for a follow up with a surgeon to discuss diverticulitis.

## 2015-09-27 ENCOUNTER — Encounter: Payer: Self-pay | Admitting: Surgery

## 2015-09-27 ENCOUNTER — Ambulatory Visit (INDEPENDENT_AMBULATORY_CARE_PROVIDER_SITE_OTHER): Payer: Medicare Other | Admitting: Surgery

## 2015-09-27 VITALS — BP 124/70 | HR 86 | Temp 97.6°F | Ht 66.0 in | Wt 235.0 lb

## 2015-09-27 DIAGNOSIS — K5732 Diverticulitis of large intestine without perforation or abscess without bleeding: Secondary | ICD-10-CM | POA: Diagnosis not present

## 2015-09-27 NOTE — Progress Notes (Signed)
Outpatient Surgical Follow Up  09/27/2015  Elizabeth [name] is an 49 y.o. female.   CC: History of diverticulitis  HPI: This patient who was seen by Dr. Pat Patrick for diverticulitis in the past and was referred to Dr. Allen Norris for colonoscopy. OB was performed and small polyps were identified benign in nature but diverticulosis was identified as well.  Past Medical History  Diagnosis Date  . Irritable bowel syndrome   . RA (rheumatoid arthritis) ([place])   . Bipolar 1 disorder ([place])   . Insomnia   . GERD (gastroesophageal reflux disease)   . Diverticulosis   . Motion sickness     boats  . Hepatitis     B - "not active"  . Heart murmur     followed by PCP    Past Surgical History  Procedure Laterality Date  . Tubal ligation    . Esophagogastroduodenoscopy (egd) with esophageal dilation    . Cholecystectomy  1997    [place], [place]  . Colonoscopy with propofol N/A 09/07/2015    Procedure: COLONOSCOPY WITH PROPOFOL;  Surgeon: Lucilla Lame, MD;  Location: [facility];  Service: Endoscopy;  Laterality: N/A;  . Esophagogastroduodenoscopy (egd) with propofol N/A 09/07/2015    Procedure: ESOPHAGOGASTRODUODENOSCOPY (EGD) WITH PROPOFOL;  Surgeon: Lucilla Lame, MD;  Location: [facility];  Service: Endoscopy;  Laterality: N/A;  . Polypectomy N/A 09/07/2015    Procedure: POLYPECTOMY;  Surgeon: Lucilla Lame, MD;  Location: [facility];  Service: Endoscopy;  Laterality: N/A;    Family History  Problem Relation Age of Onset  . Hypertension Mother   . Diabetes Mother   . Diverticulosis Mother   . Alcohol abuse Father   . Diverticulosis Maternal Aunt   . Diverticulosis Maternal Grandmother     Social History:  reports that she quit smoking about 4 months ago. Her smoking use included Cigarettes. She smoked 0.50 packs per day. She has never used smokeless tobacco. She reports that she does not drink alcohol or use illicit drugs.  Allergies:  Allergies  Allergen Reactions  .  Penicillins Hives    Medications reviewed.   Review of Systems:   Review of Systems  Constitutional: Negative.   HENT: Negative.   Eyes: Negative.   Respiratory: Negative.   Cardiovascular: Negative.   Gastrointestinal: Negative for heartburn, nausea, vomiting, abdominal pain, diarrhea, constipation, blood in stool and melena.  Genitourinary: Negative.   Musculoskeletal: Negative.   Skin: Negative.   Neurological: Negative.   Endo/Heme/Allergies: Negative.   Psychiatric/Behavioral: Negative.      Physical Exam:  BP 124/70 mmHg  Pulse 86  Temp(Src) 97.6 F (36.4 C) (Oral)  Ht 5\' 6"  (1.676 m)  Wt 235 lb (106.595 kg)  BMI 37.95 kg/m2  LMP 07/09/2014  Physical Exam  Constitutional: She is oriented to person, place, and time and well-developed, well-nourished, and in no distress. No distress.  Obese  HENT:  Head: Normocephalic and atraumatic.  Eyes: Pupils are equal, round, and reactive to light. Right eye exhibits no discharge. Left eye exhibits no discharge. No scleral icterus.  Neck: Normal range of motion.  Cardiovascular: Normal rate and regular rhythm.   Pulmonary/Chest: Effort normal and breath sounds normal. No respiratory distress. She has no wheezes. She has no rales.  Abdominal: Soft. She exhibits no distension. There is no tenderness. There is no rebound and no guarding.  Musculoskeletal: Normal range of motion. She exhibits no edema.  Lymphadenopathy:    She has no cervical adenopathy.  Neurological: She is  alert and oriented to person, place, and time.  Skin: Skin is warm and dry.  Psychiatric: Affect and judgment normal.      No results found for this or any previous visit (from the past 48 hour(s)). No results found.  Assessment/Plan:  A thorough chart review was performed to familiarize me with Dr. Rolin Barry patient. Patient currently has no pain Dr. Pat Patrick had given her a prescription of antibiotics in case she were to have her pain returned. She has  new onset new diagnosis of diabetes and is currently undergoing management of her sugars repeated visits to her doctor's office. Without a mind I would like to let the medications and diagnoses of diabetes be worked out and stabilized and then proceed with sigmoid colon resection. The procedure itself was described for the patient and the risks of recurrent diverticulitis with the need for colostomy were discussed versus the risk of elective procedures in detail. Patient agrees with this plan will follow up with me in 3-4 weeks and we will discuss colon resection once her sugars are more well controlled.  Florene Glen, MD, FACS

## 2015-09-27 NOTE — Patient Instructions (Signed)
Follow up in our office on 10/18/15 at the [facility] location after your visit with your diabetes doctor.

## 2015-09-28 ENCOUNTER — Telehealth: Payer: Self-pay | Admitting: Family Medicine

## 2015-09-28 NOTE — Telephone Encounter (Signed)
Patient is seeing a rheumatologist in [place] (whom she was seeing before she moved here) and is now requesting to see one here in this area but she is needing a referral.

## 2015-09-29 ENCOUNTER — Other Ambulatory Visit: Payer: Self-pay | Admitting: Family Medicine

## 2015-09-29 DIAGNOSIS — M069 Rheumatoid arthritis, unspecified: Secondary | ICD-10-CM

## 2015-09-29 DIAGNOSIS — Z23 Encounter for immunization: Secondary | ICD-10-CM | POA: Diagnosis not present

## 2015-09-29 NOTE — Telephone Encounter (Signed)
Referral placed.

## 2015-10-03 ENCOUNTER — Encounter: Payer: Self-pay | Admitting: Gastroenterology

## 2015-10-06 ENCOUNTER — Ambulatory Visit: Payer: Medicare Other | Admitting: *Deleted

## 2015-10-12 DIAGNOSIS — F250 Schizoaffective disorder, bipolar type: Secondary | ICD-10-CM | POA: Diagnosis not present

## 2015-10-18 ENCOUNTER — Ambulatory Visit: Payer: Medicare Other | Admitting: Surgery

## 2015-10-19 DIAGNOSIS — M160 Bilateral primary osteoarthritis of hip: Secondary | ICD-10-CM | POA: Diagnosis not present

## 2015-10-19 DIAGNOSIS — R769 Abnormal immunological finding in serum, unspecified: Secondary | ICD-10-CM | POA: Diagnosis not present

## 2015-10-19 DIAGNOSIS — Z9225 Personal history of immunosupression therapy: Secondary | ICD-10-CM | POA: Diagnosis not present

## 2015-10-19 DIAGNOSIS — M0609 Rheumatoid arthritis without rheumatoid factor, multiple sites: Secondary | ICD-10-CM | POA: Diagnosis not present

## 2015-10-23 ENCOUNTER — Ambulatory Visit (INDEPENDENT_AMBULATORY_CARE_PROVIDER_SITE_OTHER): Payer: Medicare Other | Admitting: Family Medicine

## 2015-10-23 ENCOUNTER — Encounter: Payer: Self-pay | Admitting: Family Medicine

## 2015-10-23 ENCOUNTER — Ambulatory Visit: Payer: Medicare Other | Admitting: Family Medicine

## 2015-10-23 VITALS — BP 128/76 | HR 97 | Temp 97.8°F | Resp 16 | Wt 224.9 lb

## 2015-10-23 DIAGNOSIS — M05742 Rheumatoid arthritis with rheumatoid factor of left hand without organ or systems involvement: Secondary | ICD-10-CM

## 2015-10-23 DIAGNOSIS — M05741 Rheumatoid arthritis with rheumatoid factor of right hand without organ or systems involvement: Secondary | ICD-10-CM

## 2015-10-23 DIAGNOSIS — E669 Obesity, unspecified: Secondary | ICD-10-CM | POA: Diagnosis not present

## 2015-10-23 DIAGNOSIS — R748 Abnormal levels of other serum enzymes: Secondary | ICD-10-CM | POA: Insufficient documentation

## 2015-10-23 DIAGNOSIS — Z206 Contact with and (suspected) exposure to human immunodeficiency virus [HIV]: Secondary | ICD-10-CM | POA: Insufficient documentation

## 2015-10-23 DIAGNOSIS — Z113 Encounter for screening for infections with a predominantly sexual mode of transmission: Secondary | ICD-10-CM | POA: Diagnosis not present

## 2015-10-23 DIAGNOSIS — E1169 Type 2 diabetes mellitus with other specified complication: Secondary | ICD-10-CM | POA: Insufficient documentation

## 2015-10-23 DIAGNOSIS — E1165 Type 2 diabetes mellitus with hyperglycemia: Secondary | ICD-10-CM | POA: Insufficient documentation

## 2015-10-23 DIAGNOSIS — IMO0001 Reserved for inherently not codable concepts without codable children: Secondary | ICD-10-CM

## 2015-10-23 DIAGNOSIS — M069 Rheumatoid arthritis, unspecified: Secondary | ICD-10-CM | POA: Insufficient documentation

## 2015-10-23 LAB — POCT UA - MICROALBUMIN: Microalbumin Ur, POC: 50 mg/L

## 2015-10-23 LAB — POCT GLYCOSYLATED HEMOGLOBIN (HGB A1C): Hemoglobin A1C: 8.8

## 2015-10-23 MED ORDER — METFORMIN HCL 1000 MG PO TABS: 1000.0000 mg | ORAL_TABLET | Freq: Two times a day (BID) | ORAL | Status: DC

## 2015-10-23 MED ORDER — LINAGLIPTIN 5 MG PO TABS: 5.0000 mg | ORAL_TABLET | Freq: Every day | ORAL | Status: DC

## 2015-10-23 MED ORDER — GLUCOSE BLOOD VI STRP: 1.0000 | ORAL_STRIP | Freq: Three times a day (TID) | Status: DC

## 2015-10-23 NOTE — Progress Notes (Signed)
Name: Elizabeth [name]   MRN: [id number]    DOB: 10-22-1966   Date:10/23/2015       Progress Note  Subjective  Chief Complaint  Chief Complaint  Patient presents with  . Diabetes    patient is here for a 3-month follow-up. highest 288 lowest 109  . Medication Refill    lancets and strips    HPI  Elizabeth [name] is a 49 year old female diagnosed with DM II last month by a colleague and started on Tradjenta 5 mg tablets one a day and Metformin 1000mg  one twice a day.  She was also supplied with blood glucose testing supplies, reports home glucose to be highest 288 and lowest 109. No side effects reported from medications. Her RA's doctor's office called on her personal phone while she was in the exam room and I spoke with the nurse as well. On 10/22/15 her blood work showed elevated liver enzymes: AST 40s and ALT 91. She has been instructed to withhold her methotrexate 50mg /2ML dose giving herself 0.62mL/week injections. F/U with Rheumatologist is not for another 3 months.  Past Medical History  Diagnosis Date  . Irritable bowel syndrome   . RA (rheumatoid arthritis) ([place])   . Bipolar 1 disorder ([place])   . Insomnia   . GERD (gastroesophageal reflux disease)   . Diverticulosis   . Motion sickness     boats  . Hepatitis     B - "not active"  . Heart murmur     followed by PCP    Patient Active Problem List   Diagnosis Date Noted  . Diabetes mellitus type 2, uncontrolled, without complications ([place]) [id number]  . Trouble swallowing   . Special screening for malignant neoplasms, colon   . Benign neoplasm of descending colon   . Benign neoplasm of sigmoid colon   . Bipolar disorder, current episode depressed, moderate ([place]) 07/03/2015  . Chronic rheumatic arthritis ([place]) 07/03/2015  . IBS (irritable bowel syndrome) 07/03/2015  . Insomnia, uncontrolled 07/03/2015  . GERD without esophagitis 07/03/2015  . Obesity, Class II, BMI 35-39.9 07/03/2015  . Bilateral hip bursitis 07/03/2015  .  Diverticulitis large intestine 05/07/2015    Social History  Substance Use Topics  . Smoking status: Former Smoker -- 0.50 packs/day    Types: Cigarettes    Quit date: 05/06/2015  . Smokeless tobacco: Never Used  . Alcohol Use: No     Current outpatient prescriptions:  .  aspirin EC 325 MG tablet, Take 325 mg by mouth daily., Disp: , Rfl:  .  B-D INS SYR ULTRAFINE 1CC/31G 31G X 5/16" 1 ML MISC, use to inject METHOTREXATE, Disp: , Rfl: 0 .  Cholecalciferol (VITAMIN D3) 2000 UNITS capsule, Take 2,000 Units by mouth daily., Disp: , Rfl:  .  folic acid (FOLVITE) 1 MG tablet, Take 1 mg by mouth daily., Disp: , Rfl: 0 .  glucose blood (ACCU-CHEK ACTIVE STRIPS) test strip, Use as instructed, Disp: 100 each, Rfl: 12 .  Homeopathic Products (SIMILASAN DRY EYE RELIEF) SOLN, Apply to eye., Disp: , Rfl:  .  Lancets (ACCU-CHEK MULTICLIX) lancets, Use as instructed, Disp: 100 each, Rfl: 12 .  lansoprazole (PREVACID) 30 MG capsule, take 1 capsule by mouth once daily 30 MINUTES PRIOR TO BREAKFAST, Disp: , Rfl: 1 .  linagliptin (TRADJENTA) 5 MG TABS tablet, Take 1 tablet (5 mg total) by mouth daily., Disp: 30 tablet, Rfl: 3 .  LINZESS 290 MCG CAPS capsule, Take 290 mcg by mouth every morning., Disp: ,  Rfl: 1 .  meloxicam (MOBIC) 15 MG tablet, Take 15 mg by mouth daily., Disp: , Rfl: 0 .  metFORMIN (GLUCOPHAGE) 1000 MG tablet, Take 1 tablet (1,000 mg total) by mouth 2 (two) times daily with a meal., Disp: 180 tablet, Rfl: 3 .  methotrexate 50 MG/2ML injection, inject 0.8 milliliter subcutaneously every week, Disp: , Rfl: 0 .  OLANZapine (ZYPREXA) 15 MG tablet, Take 15 mg by mouth at bedtime. , Disp: , Rfl: 0 .  traZODone (DESYREL) 100 MG tablet, Take 100 mg by mouth at bedtime., Disp: , Rfl:   Past Surgical History  Procedure Laterality Date  . Tubal ligation    . Esophagogastroduodenoscopy (egd) with esophageal dilation    . Cholecystectomy  1997    [place],   . Colonoscopy with propofol N/A  09/07/2015    Procedure: COLONOSCOPY WITH PROPOFOL;  Surgeon: Lucilla Lame, MD;  Location: [facility];  Service: Endoscopy;  Laterality: N/A;  . Esophagogastroduodenoscopy (egd) with propofol N/A 09/07/2015    Procedure: ESOPHAGOGASTRODUODENOSCOPY (EGD) WITH PROPOFOL;  Surgeon: Lucilla Lame, MD;  Location: [facility];  Service: Endoscopy;  Laterality: N/A;  . Polypectomy N/A 09/07/2015    Procedure: POLYPECTOMY;  Surgeon: Lucilla Lame, MD;  Location: [facility];  Service: Endoscopy;  Laterality: N/A;    Family History  Problem Relation Age of Onset  . Hypertension Mother   . Diabetes Mother   . Diverticulosis Mother   . Alcohol abuse Father   . Diverticulosis Maternal Aunt   . Diverticulosis Maternal Grandmother     Allergies  Allergen Reactions  . Penicillins Hives     Review of Systems  CONSTITUTIONAL: No significant weight changes, fever, chills, weakness or fatigue.  HEENT:  - Eyes: No visual changes.  CARDIOVASCULAR: No chest pain, chest pressure or chest discomfort. No palpitations or edema.  RESPIRATORY: No shortness of breath, cough or sputum.  GASTROINTESTINAL: No anorexia, nausea, vomiting. No changes in bowel habits. No abdominal pain or blood.  GENITOURINARY: No dysuria. No frequency. No discharge.  NEUROLOGICAL: No headache, dizziness, syncope, paralysis, ataxia, numbness or tingling in the extremities. No memory changes. No change in bowel or bladder control.  PSYCHIATRIC: No change in mood. No change in sleep pattern.  ENDOCRINOLOGIC: No reports of sweating, cold or heat intolerance. No polyuria or polydipsia.     Objective  BP 128/76 mmHg  Pulse 97  Temp(Src) 97.8 F (36.6 C) (Oral)  Resp 16  Wt 224 lb 14.4 oz (102.014 kg)  SpO2 96%  LMP 07/09/2014 Body mass index is 36.32 kg/(m^2).  Physical Exam  Constitutional: Patient is obese and well-nourished. In no distress.  Eyes: Conjunctivae clear, EOM movements normal. PERRLA. No  scleral icterus.  Neck: Normal range of motion. Neck supple. No JVD present. No thyromegaly present.  Cardiovascular: Normal rate, regular rhythm and normal heart sounds.  No murmur heard.  Pulmonary/Chest: Effort normal and breath sounds normal. No respiratory distress. Skin: Skin is warm and dry. No rash noted. No erythema.  Psychiatric: Patient has a normal mood and affect. Behavior is normal in office today. Judgment and thought content normal in office today.   Recent Results (from the past 2160 hour(s))  Comprehensive Metabolic Panel (CMET)     Status: Abnormal   Collection Time: 09/20/15 11:16 AM  Result Value Ref Range   Glucose 417 (H) 65 - 99 mg/dL   BUN 17 6 - 24 mg/dL   Creatinine, Ser 0.88 0.57 - 1.00 mg/dL   GFR calc  non Af Amer 78 >59 mL/min/1.73   GFR calc Af Amer 90 >59 mL/min/1.73   BUN/Creatinine Ratio 19 9 - 23   Sodium 139 134 - 144 mmol/L    Comment: **Effective September 25, 2015 the reference interval**   for Sodium, Serum will be changing to:                                             136 - 144    Potassium 4.3 3.5 - 5.2 mmol/L    Comment: **Effective September 25, 2015 the reference interval**   for Potassium, Serum will be changing to:                          0 -  7 days        3.7 - 5.2                          8 - 30 days        3.7 - 6.4                          1 -  6 months      3.8 - 6.0                   7 months -  1 year        3.8 - 5.3                              >1 year        3.5 - 5.2    Chloride 97 97 - 108 mmol/L    Comment: **Effective September 25, 2015 the reference interval**   for Chloride, Serum will be changing to:                                              97 - 106    CO2 24 18 - 29 mmol/L   Calcium 10.1 8.7 - 10.2 mg/dL   Total Protein 7.8 6.0 - 8.5 g/dL   Albumin 4.8 3.5 - 5.5 g/dL   Globulin, Total 3.0 1.5 - 4.5 g/dL   Albumin/Globulin Ratio 1.6 1.1 - 2.5   Bilirubin Total 0.4 0.0 - 1.2 mg/dL   Alkaline Phosphatase 129  (H) 39 - 117 IU/L   AST 17 0 - 40 IU/L   ALT 41 (H) 0 - 32 IU/L  Lipid Profile     Status: Abnormal   Collection Time: 09/20/15 11:16 AM  Result Value Ref Range   Cholesterol, Total 202 (H) 100 - 199 mg/dL   Triglycerides 192 (H) 0 - 149 mg/dL   HDL 43 >39 mg/dL    Comment: According to ATP-III Guidelines, HDL-C >59 mg/dL is considered a negative risk factor for CHD.    VLDL Cholesterol Cal 38 5 - 40 mg/dL   LDL Calculated 121 (H) 0 - 99 mg/dL   Chol/HDL Ratio 4.7 (H) 0.0 - 4.4 ratio units    Comment:  T. Chol/HDL Ratio                                             Men  Women                               1/2 Avg.Risk  3.4    3.3                                   Avg.Risk  5.0    4.4                                2X Avg.Risk  9.6    7.1                                3X Avg.Risk 23.4   11.0   C-peptide     Status: Abnormal   Collection Time: 09/20/15 11:16 AM  Result Value Ref Range   C-Peptide 4.8 (H) 1.1 - 4.4 ng/mL    Comment: C-Peptide reference interval is for fasting patients.  TSH     Status: None   Collection Time: 09/20/15 11:16 AM  Result Value Ref Range   TSH 0.754 0.450 - 4.500 uIU/mL  Fructosamine     Status: Abnormal   Collection Time: 09/20/15 11:16 AM  Result Value Ref Range   Fructosamine 492 (H) 0 - 285 umol/L    Comment: Published reference interval for apparently healthy subjects between age 20 and 73 is 39 - 285 umol/L and in a poorly controlled diabetic population is 228 - 563 umol/L with a mean of 396 umol/L.    Results for orders placed or performed in visit on 10/23/15 (from the past 24 hour(s))  POCT UA - Microalbumin     Status: Abnormal   Collection Time: 10/23/15 10:35 AM  Result Value Ref Range   Microalbumin Ur, POC 50 mg/L   Creatinine, POC  mg/dL   Albumin/Creatinine Ratio, Urine, POC    POCT HgB A1C     Status: Abnormal   Collection Time: 10/23/15 10:49 AM  Result Value Ref Range   Hemoglobin A1C 8.8       Assessment & Plan  1. Uncontrolled type 2 diabetes mellitus without complication, without long-term current use of insulin ([place]) Patient's Hba1c goal is <6.5% for stringent control. Not at goal but just started meds 1 month ago.   Patient's Hba1c goal is <7% is acceptable  Encouraged patient to continue efforts on checking blood glucose on a daily basis. Fasting blood glucose control goal is 80-130mg /dL and post prandial blood glucose control is 180mg /dL.  Reviewed diet, exercise, lifestyle changes and current medication regimen pertaining to diabetes with the patient.   Reminded patient of the required annual dilated retinal exam.   - POCT HgB A1C - POCT UA - Microalbumin - glucose blood (ACCU-CHEK ACTIVE STRIPS) test strip; 1 each by Other route 3 (three) times daily. Use as instructed  Dispense: 100 each; Refill: 12 - metFORMIN (GLUCOPHAGE) 1000 MG tablet; Take 1 tablet (1,000 mg total) by mouth 2 (two) times daily with a meal.  Dispense: 180  tablet; Refill: 3 - linagliptin (TRADJENTA) 5 MG TABS tablet; Take 1 tablet (5 mg total) by mouth daily.  Dispense: 90 tablet; Refill: 3 - Ambulatory referral to Ophthalmology  2. Obesity, Class II, BMI 35-39.9 The patient has been counseled on their higher than normal BMI.  They have verbally expressed understanding their increased risk for other diseases.  In efforts to meet a better target BMI goal the patient has been counseled on lifestyle, diet and exercise modification tactics. Start with moderate intensity aerobic exercise (walking, jogging, elliptical, swimming, group or individual sports, hiking) at least 36mins a day at least 4 days a week and increase intensity, duration, frequency as tolerated. Diet should include well balance fresh fruits and vegetables avoiding processed foods, carbohydrates and sugars. Drink at least 8oz 10 glasses a day avoiding sodas, sugary fruit drinks, sweetened tea. Check weight on a reliable scale daily  and monitor weight loss progress daily. Consider investing in mobile phone apps that will help keep track of weight loss goals.   3. Elevated liver enzymes Will start with blood work, to be drawn in 2 weeks as she just gave Methotrexate injection over the weekend.   - Gamma GT - AST - ALT - Hepatitis A antibody, total - Hepatitis B surface antibody - Hepatitis C antibody - RPR  4. Rheumatoid arthritis involving both hands with positive rheumatoid factor (HCC) Instructed to stop taking Methotrexate.  5. Screening for STD (sexually transmitted disease)  - Hepatitis A antibody, total - Hepatitis B surface antibody - Hepatitis C antibody - RPR  6. HIV exposure  - HIV antibody

## 2015-10-25 ENCOUNTER — Telehealth: Payer: Self-pay

## 2015-10-25 NOTE — Telephone Encounter (Signed)
Contacted this patient's pharmacist and He stated that her rx was rejected by medicare and forms was sent to out office to be completed by Dr. Nadine Counts in order for it to be filled. I informed him that we did not received anything and asked if he could resend it. He did.

## 2015-11-06 ENCOUNTER — Other Ambulatory Visit: Payer: Self-pay

## 2015-11-06 DIAGNOSIS — E1165 Type 2 diabetes mellitus with hyperglycemia: Principal | ICD-10-CM

## 2015-11-06 DIAGNOSIS — IMO0001 Reserved for inherently not codable concepts without codable children: Secondary | ICD-10-CM

## 2015-11-06 NOTE — Telephone Encounter (Signed)
Got a fax from [facility] stating, "Patient is now testing three times a day. Please send a new rx that reflects the updated sig so we can process through insurance."  Refill request was sent to Dr. Bobetta Lime for approval and submission.

## 2015-11-07 ENCOUNTER — Emergency Department
Admission: EM | Admit: 2015-11-07 | Discharge: 2015-11-07 | Disposition: A | Discharge status: Home / Self Care | Payer: Medicare Other | Attending: Emergency Medicine | Admitting: Emergency Medicine

## 2015-11-07 ENCOUNTER — Telehealth: Payer: Self-pay | Admitting: Family Medicine

## 2015-11-07 ENCOUNTER — Other Ambulatory Visit: Payer: Self-pay | Admitting: Surgery

## 2015-11-07 ENCOUNTER — Encounter: Payer: Self-pay | Admitting: Emergency Medicine

## 2015-11-07 ENCOUNTER — Other Ambulatory Visit: Payer: Medicare Other

## 2015-11-07 ENCOUNTER — Other Ambulatory Visit: Payer: Self-pay

## 2015-11-07 ENCOUNTER — Emergency Department: Payer: Medicare Other

## 2015-11-07 DIAGNOSIS — Z87891 Personal history of nicotine dependence: Secondary | ICD-10-CM | POA: Insufficient documentation

## 2015-11-07 DIAGNOSIS — R1030 Lower abdominal pain, unspecified: Secondary | ICD-10-CM | POA: Diagnosis not present

## 2015-11-07 DIAGNOSIS — M545 Low back pain, unspecified: Secondary | ICD-10-CM

## 2015-11-07 DIAGNOSIS — E119 Type 2 diabetes mellitus without complications: Secondary | ICD-10-CM | POA: Insufficient documentation

## 2015-11-07 DIAGNOSIS — N63 Unspecified lump in unspecified breast: Secondary | ICD-10-CM

## 2015-11-07 DIAGNOSIS — Z880 Allergy status to penicillin: Secondary | ICD-10-CM | POA: Insufficient documentation

## 2015-11-07 HISTORY — DX: Type 2 diabetes mellitus without complications: E11.9

## 2015-11-07 LAB — CBC WITH DIFFERENTIAL/PLATELET
BASOS ABS: 0 10*3/uL (ref 0–0.1)
BASOS PCT: 0 %
EOS PCT: 2 %
Eosinophils Absolute: 0.1 10*3/uL (ref 0–0.7)
HCT: 39 % (ref 35.0–47.0)
Hemoglobin: 12.9 g/dL (ref 12.0–16.0)
Lymphocytes Relative: 35 %
Lymphs Abs: 1.9 10*3/uL (ref 1.0–3.6)
MCH: 30 pg (ref 26.0–34.0)
MCHC: 33.2 g/dL (ref 32.0–36.0)
MCV: 90.2 fL (ref 80.0–100.0)
MONO ABS: 0.3 10*3/uL (ref 0.2–0.9)
Monocytes Relative: 6 %
NEUTROS ABS: 3 10*3/uL (ref 1.4–6.5)
Neutrophils Relative %: 57 %
PLATELETS: 214 10*3/uL (ref 150–440)
RBC: 4.32 MIL/uL (ref 3.80–5.20)
RDW: 14.1 % (ref 11.5–14.5)
WBC: 5.3 10*3/uL (ref 3.6–11.0)

## 2015-11-07 LAB — URINALYSIS COMPLETE WITH MICROSCOPIC (ARMC ONLY)
BILIRUBIN URINE: NEGATIVE
Glucose, UA: NEGATIVE mg/dL
Ketones, ur: NEGATIVE mg/dL
LEUKOCYTES UA: NEGATIVE
Nitrite: NEGATIVE
PH: 6 (ref 5.0–8.0)
Protein, ur: NEGATIVE mg/dL
Specific Gravity, Urine: 1.005 (ref 1.005–1.030)

## 2015-11-07 LAB — COMPREHENSIVE METABOLIC PANEL
ALBUMIN: 4.1 g/dL (ref 3.5–5.0)
ALT: 41 U/L (ref 14–54)
AST: 26 U/L (ref 15–41)
Alkaline Phosphatase: 71 U/L (ref 38–126)
Anion gap: 8 (ref 5–15)
BUN: 11 mg/dL (ref 6–20)
CHLORIDE: 108 mmol/L (ref 101–111)
CO2: 27 mmol/L (ref 22–32)
Calcium: 9.1 mg/dL (ref 8.9–10.3)
Creatinine, Ser: 0.87 mg/dL (ref 0.44–1.00)
GFR calc Af Amer: >60 mL/min (ref >60)
GFR calc non Af Amer: >60 mL/min (ref >60)
GLUCOSE: 104 mg/dL — AB (ref 65–99)
POTASSIUM: 3.5 mmol/L (ref 3.5–5.1)
Sodium: 143 mmol/L (ref 135–145)
Total Bilirubin: 0.5 mg/dL (ref 0.3–1.2)
Total Protein: 7.3 g/dL (ref 6.5–8.1)

## 2015-11-07 LAB — LIPASE, BLOOD: LIPASE: 26 U/L (ref 11–51)

## 2015-11-07 MED ORDER — DIAZEPAM 5 MG PO TABS: 5.0000 mg | ORAL_TABLET | Freq: Three times a day (TID) | ORAL | Status: DC | PRN

## 2015-11-07 MED ORDER — IBUPROFEN 800 MG PO TABS: 800.0000 mg | ORAL_TABLET | Freq: Three times a day (TID) | ORAL | Status: DC | PRN

## 2015-11-07 MED ORDER — IOHEXOL 350 MG/ML SOLN
100.0000 mL | Freq: Once | INTRAVENOUS | Status: AC | PRN
  Administered 2015-11-07: 100 mL via INTRAVENOUS

## 2015-11-07 MED ORDER — IOHEXOL 240 MG/ML SOLN
25.0000 mL | Freq: Once | INTRAMUSCULAR | Status: AC | PRN
  Administered 2015-11-07: 25 mL via ORAL

## 2015-11-07 MED ORDER — CIPROFLOXACIN HCL 500 MG PO TABS
500.0000 mg | ORAL_TABLET | Freq: Once | ORAL | Status: DC
  Filled 2015-11-07: qty 1

## 2015-11-07 MED ORDER — SODIUM CHLORIDE 0.9 % IV SOLN
Freq: Once | INTRAVENOUS | Status: AC
  Administered 2015-11-07: 1000 mL via INTRAVENOUS

## 2015-11-07 MED ORDER — GLUCOSE BLOOD VI STRP: ORAL_STRIP | Status: DC

## 2015-11-07 MED ORDER — GLUCOSE BLOOD VI STRP: 1.0000 | ORAL_STRIP | Freq: Three times a day (TID) | Status: DC

## 2015-11-07 MED ORDER — HYDROMORPHONE HCL 1 MG/ML IJ SOLN
1.0000 mg | Freq: Once | INTRAMUSCULAR | Status: AC
  Administered 2015-11-07: 1 mg via INTRAVENOUS
  Filled 2015-11-07: qty 1

## 2015-11-07 MED ORDER — METRONIDAZOLE 500 MG PO TABS
500.0000 mg | ORAL_TABLET | Freq: Once | ORAL | Status: AC
  Administered 2015-11-07: 500 mg via ORAL
  Filled 2015-11-07: qty 1

## 2015-11-07 NOTE — ED Provider Notes (Signed)
[facility] Emergency Department Provider Note     Time seen: ----------------------------------------- 4:49 PM on 11/07/2015 -----------------------------------------    I have reviewed the triage vital signs and the nursing notes.   HISTORY  Chief Complaint Abdominal Pain    HPI Elizabeth [name] is a 49 y.o. female presents ER for lower abdominal pain and back pain. Patient has history of diverticulitis and this feels like same.Patient was told by her general surgeon to come to the ER for evaluation and make sure she did not have diverticular rupture. Patient denies any fevers or chills, does complaining of severe lower abdominal and low back pain. Nothing makes the pain better or worse.   Past Medical History  Diagnosis Date  . Irritable bowel syndrome   . RA (rheumatoid arthritis) ([place])   . Bipolar 1 disorder ([place])   . Insomnia   . GERD (gastroesophageal reflux disease)   . Diverticulosis   . Motion sickness     boats  . Hepatitis     B - "not active"  . Heart murmur     followed by PCP    Patient Active Problem List   Diagnosis Date Noted  . Diabetes mellitus type 2, uncontrolled, without complications ([place]) XX123456  . Elevated liver enzymes 10/23/2015  . RA (rheumatoid arthritis) ([place]) 10/23/2015  . Rheumatoid arthritis involving both hands ([place]) 10/23/2015  . Screening for STD (sexually transmitted disease) 10/23/2015  . HIV exposure 10/23/2015  . Trouble swallowing   . Special screening for malignant neoplasms, colon   . Benign neoplasm of descending colon   . Benign neoplasm of sigmoid colon   . Bipolar disorder, current episode depressed, moderate ([place]) 07/03/2015  . Chronic rheumatic arthritis ([place]) 07/03/2015  . IBS (irritable bowel syndrome) 07/03/2015  . Insomnia, uncontrolled 07/03/2015  . GERD without esophagitis 07/03/2015  . Obesity, Class II, BMI 35-39.9 07/03/2015  . Bilateral hip bursitis 07/03/2015  .  Diverticulitis large intestine 05/07/2015    Past Surgical History  Procedure Laterality Date  . Tubal ligation    . Esophagogastroduodenoscopy (egd) with esophageal dilation    . Cholecystectomy  1997    [place], [place]  . Colonoscopy with propofol N/A 09/07/2015    Procedure: COLONOSCOPY WITH PROPOFOL;  Surgeon: Lucilla Lame, MD;  Location: [facility];  Service: Endoscopy;  Laterality: N/A;  . Esophagogastroduodenoscopy (egd) with propofol N/A 09/07/2015    Procedure: ESOPHAGOGASTRODUODENOSCOPY (EGD) WITH PROPOFOL;  Surgeon: Lucilla Lame, MD;  Location: [facility];  Service: Endoscopy;  Laterality: N/A;  . Polypectomy N/A 09/07/2015    Procedure: POLYPECTOMY;  Surgeon: Lucilla Lame, MD;  Location: [facility];  Service: Endoscopy;  Laterality: N/A;    Allergies Penicillins  Social History Social History  Substance Use Topics  . Smoking status: Former Smoker -- 0.50 packs/day    Types: Cigarettes    Quit date: 05/06/2015  . Smokeless tobacco: Never Used  . Alcohol Use: No    Review of Systems Constitutional: Negative for fever. Eyes: Negative for visual changes. ENT: Negative for sore throat. Cardiovascular: Negative for chest pain. Respiratory: Negative for shortness of breath. Gastrointestinal: Positive for abdominal pain. Genitourinary: Negative for dysuria. Musculoskeletal: Positive for low back pain Skin: Negative for rash. Neurological: Negative for headaches, focal weakness or numbness.  10-point ROS otherwise negative.  ____________________________________________   PHYSICAL EXAM:  VITAL SIGNS: ED Triage Vitals  Enc Vitals Group     BP 11/07/15 1645 151/104 mmHg     Pulse Rate 11/07/15  1645 93     Resp 11/07/15 1645 20     Temp 11/07/15 1645 98.2 F (36.8 C)     Temp Source 11/07/15 1645 Oral     SpO2 11/07/15 1645 98 %     Weight 11/07/15 1645 228 lb (103.42 kg)     Height 11/07/15 1645 5\' 6"  (1.676 m)     Head Cir --      Peak  Flow --      Pain Score 11/07/15 1646 5     Pain Loc --      Pain Edu? --      Excl. in [place]? --     Constitutional: Alert and oriented. Well appearing and in no distress. Eyes: Conjunctivae are normal. PERRL. Normal extraocular movements. ENT   Head: Normocephalic and atraumatic.   Nose: No congestion/rhinnorhea.   Mouth/Throat: Mucous membranes are moist.   Neck: No stridor. Cardiovascular: Normal rate, regular rhythm. Normal and symmetric distal pulses are present in all extremities. No murmurs, rubs, or gallops. Respiratory: Normal respiratory effort without tachypnea nor retractions. Breath sounds are clear and equal bilaterally. No wheezes/rales/rhonchi. Gastrointestinal: Diffuse lower abdominal tenderness, no rebound or guarding. Normal bowel sounds. Musculoskeletal: Nontender with normal range of motion in all extremities. No joint effusions.  No lower extremity tenderness nor edema. Neurologic:  Normal speech and language. No gross focal neurologic deficits are appreciated. Speech is normal. No gait instability. Skin:  Skin is warm, dry and intact. No rash noted. Psychiatric: Mood and affect are normal. Speech and behavior are normal. Patient exhibits appropriate insight and judgment. ____________________________________________  ED COURSE:  Pertinent labs & imaging results that were available during my care of the patient were reviewed by me and considered in my medical decision making (see chart for details). Patients in no acute distress, will check basic labs and likely repeat CT imaging.  ____________________________________________    LABS (pertinent positives/negatives)  Labs Reviewed  COMPREHENSIVE METABOLIC PANEL - Abnormal; Notable for the following:    Glucose, Bld 104 (*)    All other components within normal limits  URINALYSIS COMPLETEWITH MICROSCOPIC ([facility] ONLY) - Abnormal; Notable for the following:    Color, Urine STRAW (*)    APPearance CLEAR  (*)    Hgb urine dipstick 1+ (*)    Bacteria, UA RARE (*)    Squamous Epithelial / LPF 0-5 (*)    All other components within normal limits  CBC WITH DIFFERENTIAL/PLATELET  LIPASE, BLOOD    RADIOLOGY Images were viewed by me  CT of the abdomen and pelvis IMPRESSION: Diverticulosis with no evidence of diverticulitis.  Recommend diagnostic mammography for possible right breast mass ____________________________________________  FINAL ASSESSMENT AND PLAN  Abdominal pain, low back pain  Plan: Patient with labs and imaging as dictated above. Patient with normal CT of the abdomen and pelvis. There was a breast mass that she will be instructed to follow-up with for mammogram. She is stable for outpatient follow-up.   Earleen Newport, MD   Earleen Newport, MD 11/07/15 [phone]

## 2015-11-07 NOTE — Telephone Encounter (Signed)
Returned patient call. Patient reports a pain 8 of 10 that is similar to the pain she had during past diverticulitis episodes and she has started taking a antibiotics that Dr Burt Knack had prescribed her in case of a flair up. The patient stated that the pain has been building since last week. Since the patient has had a past history of diverticulitis; I directed her to go to the ED for a abdominal xray to ensure that one of her diverticulum have not ruptured. Patient confirmed understanding of information and direction, and is planning to go to the ED.

## 2015-11-07 NOTE — ED Notes (Signed)
Pt in via triage w/ complaints of pain due to diverticulitis.  Pt states intermittent lower abdominal pain, upper back pain since 11/24.  Pt called her MD today; was instructed to take 500mg  Cipro and come to ED.  Pt denies any fatigue, nausea/vomitting.  Pt A/Ox4, no immediate distress.

## 2015-11-07 NOTE — Telephone Encounter (Signed)
Spoke with the pharmacist at [facility] and he stated that she had enough lancets but needed the accu-chek smart view test strips  Refill request was sent to Dr. Bobetta Lime for approval and submission.

## 2015-11-07 NOTE — ED Notes (Signed)
Patient transported to CT 

## 2015-11-07 NOTE — Telephone Encounter (Signed)
Please call patient. Her Diverticulitis has flared up again and she is having pain. Would like to pick up a prescription of pain medication.

## 2015-11-07 NOTE — Discharge Instructions (Signed)
Abdominal Pain, Adult Many things can cause abdominal pain. Usually, abdominal pain is not caused by a disease and will improve without treatment. It can often be observed and treated at home. Your health care provider will do a physical exam and possibly order blood tests and X-rays to help determine the seriousness of your pain. However, in many cases, more time must pass before a clear cause of the pain can be found. Before that point, your health care provider may not know if you need more testing or further treatment. HOME CARE INSTRUCTIONS Monitor your abdominal pain for any changes. The following actions may help to alleviate any discomfort you are experiencing: 1. Only take over-the-counter or prescription medicines as directed by your health care provider. 2. Do not take laxatives unless directed to do so by your health care provider. 3. Try a clear liquid diet (broth, tea, or water) as directed by your health care provider. Slowly move to a bland diet as tolerated. SEEK MEDICAL CARE IF: 1. You have unexplained abdominal pain. 2. You have abdominal pain associated with nausea or diarrhea. 3. You have pain when you urinate or have a bowel movement. 4. You experience abdominal pain that wakes you in the night. 5. You have abdominal pain that is worsened or improved by eating food. 6. You have abdominal pain that is worsened with eating fatty foods. 7. You have a fever. SEEK IMMEDIATE MEDICAL CARE IF: 1. Your pain does not go away within 2 hours. 2. You keep throwing up (vomiting). 3. Your pain is felt only in portions of the abdomen, such as the right side or the left lower portion of the abdomen. 4. You pass bloody or black tarry stools. MAKE SURE YOU: 1. Understand these instructions. 2. Will watch your condition. 3. Will get help right away if you are not doing well or get worse.   This information is not intended to replace advice given to you by your health care provider. Make  sure you discuss any questions you have with your health care provider.   Document Released: 09/04/2005 Document Revised: 08/16/2015 Document Reviewed: 08/04/2013 Elsevier Interactive Patient Education 2016 Elsevier Inc.  Back Exercises The following exercises strengthen the muscles that help to support the back. They also help to keep the lower back flexible. Doing these exercises can help to prevent back pain or lessen existing pain. If you have back pain or discomfort, try doing these exercises 2-3 times each day or as told by your health care provider. When the pain goes away, do them once each day, but increase the number of times that you repeat the steps for each exercise (do more repetitions). If you do not have back pain or discomfort, do these exercises once each day or as told by your health care provider. EXERCISES Single Knee to Chest Repeat these steps 3-5 times for each leg: 4. Lie on your back on a firm bed or the floor with your legs extended. 5. Bring one knee to your chest. Your other leg should stay extended and in contact with the floor. 6. Hold your knee in place by grabbing your knee or thigh. 7. Pull on your knee until you feel a gentle stretch in your lower back. 8. Hold the stretch for 10-30 seconds. 9. Slowly release and straighten your leg. Pelvic Tilt Repeat these steps 5-10 times: 8. Lie on your back on a firm bed or the floor with your legs extended. 9. Bend your knees so they are  pointing toward the ceiling and your feet are flat on the floor. 10. Tighten your lower abdominal muscles to press your lower back against the floor. This motion will tilt your pelvis so your tailbone points up toward the ceiling instead of pointing to your feet or the floor. 11. With gentle tension and even breathing, hold this position for 5-10 seconds. Cat-Cow Repeat these steps until your lower back becomes more flexible: 5. Get into a hands-and-knees position on a firm surface.  Keep your hands under your shoulders, and keep your knees under your hips. You may place padding under your knees for comfort. 6. Let your head hang down, and point your tailbone toward the floor so your lower back becomes rounded like the back of a cat. 7. Hold this position for 5 seconds. 8. Slowly lift your head and point your tailbone up toward the ceiling so your back forms a sagging arch like the back of a cow. 9. Hold this position for 5 seconds. Press-Ups Repeat these steps 5-10 times: 4. Lie on your abdomen (face-down) on the floor. 5. Place your palms near your head, about shoulder-width apart. 6. While you keep your back as relaxed as possible and keep your hips on the floor, slowly straighten your arms to raise the top half of your body and lift your shoulders. Do not use your back muscles to raise your upper torso. You may adjust the placement of your hands to make yourself more comfortable. 7. Hold this position for 5 seconds while you keep your back relaxed. 8. Slowly return to lying flat on the floor. Bridges Repeat these steps 10 times: 1. Lie on your back on a firm surface. 2. Bend your knees so they are pointing toward the ceiling and your feet are flat on the floor. 3. Tighten your buttocks muscles and lift your buttocks off of the floor until your waist is at almost the same height as your knees. You should feel the muscles working in your buttocks and the back of your thighs. If you do not feel these muscles, slide your feet 1-2 inches farther away from your buttocks. 4. Hold this position for 3-5 seconds. 5. Slowly lower your hips to the starting position, and allow your buttocks muscles to relax completely. If this exercise is too easy, try doing it with your arms crossed over your chest. Abdominal Crunches Repeat these steps 5-10 times: 1. Lie on your back on a firm bed or the floor with your legs extended. 2. Bend your knees so they are pointing toward the ceiling and  your feet are flat on the floor. 3. Cross your arms over your chest. 4. Tip your chin slightly toward your chest without bending your neck. 5. Tighten your abdominal muscles and slowly raise your trunk (torso) high enough to lift your shoulder blades a tiny bit off of the floor. Avoid raising your torso higher than that, because it can put too much stress on your low back and it does not help to strengthen your abdominal muscles. 6. Slowly return to your starting position. Back Lifts Repeat these steps 5-10 times: 1. Lie on your abdomen (face-down) with your arms at your sides, and rest your forehead on the floor. 2. Tighten the muscles in your legs and your buttocks. 3. Slowly lift your chest off of the floor while you keep your hips pressed to the floor. Keep the back of your head in line with the curve in your back. Your eyes should be  looking at the floor. 4. Hold this position for 3-5 seconds. 5. Slowly return to your starting position. SEEK MEDICAL CARE IF:  Your back pain or discomfort gets much worse when you do an exercise.  Your back pain or discomfort does not lessen within 2 hours after you exercise. If you have any of these problems, stop doing these exercises right away. Do not do them again unless your health care provider says that you can. SEEK IMMEDIATE MEDICAL CARE IF:  You develop sudden, severe back pain. If this happens, stop doing the exercises right away. Do not do them again unless your health care provider says that you can.   This information is not intended to replace advice given to you by your health care provider. Make sure you discuss any questions you have with your health care provider.   Document Released: 01/02/2005 Document Revised: 08/16/2015 Document Reviewed: 01/19/2015 Elsevier Interactive Patient Education [employer].

## 2015-11-07 NOTE — Telephone Encounter (Signed)
Patient states that the wrong test strips where called in. It should have been Accu Check Nano lancets and test strips not [employer]iva. Please resend to [facility]

## 2015-11-07 NOTE — ED Notes (Signed)
Having lower abd and back pain  Denies any n/v or fever   Hx of diverticulitis and feels the same

## 2015-11-08 ENCOUNTER — Encounter: Payer: Self-pay | Admitting: Family Medicine

## 2015-11-08 ENCOUNTER — Ambulatory Visit (INDEPENDENT_AMBULATORY_CARE_PROVIDER_SITE_OTHER): Payer: Medicare Other | Admitting: Family Medicine

## 2015-11-08 VITALS — BP 138/80 | HR 112 | Temp 98.8°F | Resp 18 | Wt 223.2 lb

## 2015-11-08 DIAGNOSIS — N6310 Unspecified lump in the right breast, unspecified quadrant: Secondary | ICD-10-CM | POA: Insufficient documentation

## 2015-11-08 DIAGNOSIS — N6459 Other signs and symptoms in breast: Secondary | ICD-10-CM

## 2015-11-08 DIAGNOSIS — N63 Unspecified lump in breast: Secondary | ICD-10-CM | POA: Diagnosis not present

## 2015-11-08 DIAGNOSIS — Z206 Contact with and (suspected) exposure to human immunodeficiency virus [HIV]: Secondary | ICD-10-CM | POA: Diagnosis not present

## 2015-11-08 DIAGNOSIS — Z113 Encounter for screening for infections with a predominantly sexual mode of transmission: Secondary | ICD-10-CM | POA: Diagnosis not present

## 2015-11-08 DIAGNOSIS — R748 Abnormal levels of other serum enzymes: Secondary | ICD-10-CM | POA: Diagnosis not present

## 2015-11-08 NOTE — Progress Notes (Signed)
Name: Elizabeth [name]   MRN: [id number]    DOB: 1966-05-22   Date:11/08/2015       Progress Note  Subjective  Chief Complaint  Chief Complaint  Patient presents with  . Follow-up    HPI  [name] is a 49 year old female diagnosed with DM II recently, known history of RA, IBS, Bipolar disorder, GERD as well as other fairly stable disorders who is here today for ER follow up. Seen in ER 11/07/15 for abdominal pain, she felt it may be re occurrence of diverticulitis, however lab work and imaging did not suggest infection or perforation. Did find incidental possible right breast mass on CT abdomen Pelvis:  Asymmetric density subareolar right breast with possible nipple inversion.  Past Medical History  Diagnosis Date  . Irritable bowel syndrome   . RA (rheumatoid arthritis) ([place])   . Bipolar 1 disorder ([place])   . Insomnia   . GERD (gastroesophageal reflux disease)   . Diverticulosis   . Motion sickness     boats  . Hepatitis     B - "not active"  . Heart murmur     followed by PCP  . Diabetes mellitus without complication [facility])     Patient Active Problem List   Diagnosis Date Noted  . Diabetes mellitus type 2, uncontrolled, without complications ([place]) 27/51/7001  . Elevated liver enzymes 10/23/2015  . RA (rheumatoid arthritis) ([place]) 10/23/2015  . Rheumatoid arthritis involving both hands ([place]) 10/23/2015  . Screening for STD (sexually transmitted disease) 10/23/2015  . HIV exposure 10/23/2015  . Trouble swallowing   . Special screening for malignant neoplasms, colon   . Benign neoplasm of descending colon   . Benign neoplasm of sigmoid colon   . Bipolar disorder, current episode depressed, moderate ([place]) 07/03/2015  . Chronic rheumatic arthritis ([place]) 07/03/2015  . IBS (irritable bowel syndrome) 07/03/2015  . Insomnia, uncontrolled 07/03/2015  . GERD without esophagitis 07/03/2015  . Obesity, Class II, BMI 35-39.9 07/03/2015  . Bilateral hip bursitis 07/03/2015  .  Diverticulitis large intestine 05/07/2015    Social History  Substance Use Topics  . Smoking status: Former Smoker -- 0.50 packs/day    Types: Cigarettes    Quit date: 05/06/2015  . Smokeless tobacco: Never Used  . Alcohol Use: No     Current outpatient prescriptions:  .  aspirin EC 325 MG tablet, Take 325 mg by mouth daily., Disp: , Rfl:  .  B-D INS SYR ULTRAFINE 1CC/31G 31G X 5/16" 1 ML MISC, use to inject METHOTREXATE, Disp: , Rfl: 0 .  Cholecalciferol (VITAMIN D3) 2000 UNITS capsule, Take 2,000 Units by mouth daily., Disp: , Rfl:  .  diazepam (VALIUM) 5 MG tablet, Take 1 tablet (5 mg total) by mouth every 8 (eight) hours as needed for muscle spasms., Disp: 20 tablet, Rfl: 0 .  folic acid (FOLVITE) 1 MG tablet, Take 1 mg by mouth daily., Disp: , Rfl: 0 .  glucose blood (ACCU-CHEK SMARTVIEW) test strip, Use as instructed, Disp: 100 each, Rfl: 12 .  Homeopathic Products (SIMILASAN DRY EYE RELIEF) SOLN, Apply to eye., Disp: , Rfl:  .  ibuprofen (ADVIL,MOTRIN) 800 MG tablet, Take 1 tablet (800 mg total) by mouth every 8 (eight) hours as needed., Disp: 30 tablet, Rfl: 0 .  lansoprazole (PREVACID) 30 MG capsule, take 1 capsule by mouth once daily 30 MINUTES PRIOR TO BREAKFAST, Disp: , Rfl: 1 .  linagliptin (TRADJENTA) 5 MG TABS tablet, Take 1 tablet (5 mg total)  by mouth daily., Disp: 90 tablet, Rfl: 3 .  LINZESS 290 MCG CAPS capsule, Take 290 mcg by mouth every morning., Disp: , Rfl: 1 .  meloxicam (MOBIC) 15 MG tablet, Take 15 mg by mouth daily., Disp: , Rfl: 0 .  metFORMIN (GLUCOPHAGE) 1000 MG tablet, Take 1 tablet (1,000 mg total) by mouth 2 (two) times daily with a meal., Disp: 180 tablet, Rfl: 3 .  methotrexate 50 MG/2ML injection, inject 0.8 milliliters subcutaneously every week, Disp: , Rfl: 0 .  OLANZapine (ZYPREXA) 15 MG tablet, Take 15 mg by mouth at bedtime. , Disp: , Rfl: 0 .  PNEUMOVAX 23 25 MCG/0.5ML injection, inject 0.5 milliliter intramuscularly, Disp: , Rfl: 0 .   traZODone (DESYREL) 100 MG tablet, Take 100 mg by mouth at bedtime., Disp: , Rfl:   Past Surgical History  Procedure Laterality Date  . Tubal ligation    . Esophagogastroduodenoscopy (egd) with esophageal dilation    . Cholecystectomy  1997    [place], [place]  . Colonoscopy with propofol N/A 09/07/2015    Procedure: COLONOSCOPY WITH PROPOFOL;  Surgeon: Lucilla Lame, MD;  Location: [place];  Service: Endoscopy;  Laterality: N/A;  . Esophagogastroduodenoscopy (egd) with propofol N/A 09/07/2015    Procedure: ESOPHAGOGASTRODUODENOSCOPY (EGD) WITH PROPOFOL;  Surgeon: Lucilla Lame, MD;  Location: [place];  Service: Endoscopy;  Laterality: N/A;  . Polypectomy N/A 09/07/2015    Procedure: POLYPECTOMY;  Surgeon: Lucilla Lame, MD;  Location: [place];  Service: Endoscopy;  Laterality: N/A;    Family History  Problem Relation Age of Onset  . Hypertension Mother   . Diabetes Mother   . Diverticulosis Mother   . Alcohol abuse Father   . Diverticulosis Maternal Aunt   . Diverticulosis Maternal Grandmother     Allergies  Allergen Reactions  . Penicillins Hives     Review of Systems  CONSTITUTIONAL: No significant weight changes, fever, chills, weakness or fatigue.  CARDIOVASCULAR: No chest pain, chest pressure or chest discomfort. No palpitations or edema.  RESPIRATORY: No shortness of breath, cough or sputum.  LYMPHATICS: No enlarged lymph nodes.  PSYCHIATRIC: No change in mood. No change in sleep pattern.   Objective  BP 138/80 mmHg  Pulse 112  Temp(Src) 98.8 F (37.1 C) (Oral)  Resp 18  Wt 223 lb 3.2 oz (101.243 kg)  SpO2 97%  LMP 07/09/2014 Body mass index is 36.04 kg/(m^2).  Physical Exam  Constitutional: Patient is obese. and well-nourished. In no distress.   Cardiovascular: Normal rate, regular rhythm and normal heart sounds.  No murmur heard.  Pulmonary/Chest: Effort normal and breath sounds normal. No respiratory distress. Breast: Bilateral  breasts examined as well as bilateral axillary regions. Left breast and axilla no palpable masses. Right breast palpable mass at the nipple border at about 7 o'clock position with nipple border inversion when laying on table but not when upright.  Psychiatric: Patient has a stable mood and affect. Behavior is normal in office today. Judgment and thought content normal in office today.   Recent Results (from the past 2160 hour(s))  Comprehensive Metabolic Panel (CMET)     Status: Abnormal   Collection Time: 09/20/15 11:16 AM  Result Value Ref Range   Glucose 417 (H) 65 - 99 mg/dL   BUN 17 6 - 24 mg/dL   Creatinine, Ser 0.88 0.57 - 1.00 mg/dL   GFR calc non Af Amer 78 >59 mL/min/1.73   GFR calc Af Amer 90 >59 mL/min/1.73   BUN/Creatinine Ratio 19  9 - 23   Sodium 139 134 - 144 mmol/L    Comment: **Effective September 25, 2015 the reference interval**   for Sodium, Serum will be changing to:                                             136 - 144    Potassium 4.3 3.5 - 5.2 mmol/L    Comment: **Effective September 25, 2015 the reference interval**   for Potassium, Serum will be changing to:                          0 -  7 days        3.7 - 5.2                          8 - 30 days        3.7 - 6.4                          1 -  6 months      3.8 - 6.0                   7 months -  1 year        3.8 - 5.3                              >1 year        3.5 - 5.2    Chloride 97 97 - 108 mmol/L    Comment: **Effective September 25, 2015 the reference interval**   for Chloride, Serum will be changing to:                                              97 - 106    CO2 24 18 - 29 mmol/L   Calcium 10.1 8.7 - 10.2 mg/dL   Total Protein 7.8 6.0 - 8.5 g/dL   Albumin 4.8 3.5 - 5.5 g/dL   Globulin, Total 3.0 1.5 - 4.5 g/dL   Albumin/Globulin Ratio 1.6 1.1 - 2.5   Bilirubin Total 0.4 0.0 - 1.2 mg/dL   Alkaline Phosphatase 129 (H) 39 - 117 IU/L   AST 17 0 - 40 IU/L   ALT 41 (H) 0 - 32 IU/L  Lipid Profile      Status: Abnormal   Collection Time: 09/20/15 11:16 AM  Result Value Ref Range   Cholesterol, Total 202 (H) 100 - 199 mg/dL   Triglycerides 192 (H) 0 - 149 mg/dL   HDL 43 >39 mg/dL    Comment: According to ATP-III Guidelines, HDL-C >59 mg/dL is considered a negative risk factor for CHD.    VLDL Cholesterol Cal 38 5 - 40 mg/dL   LDL Calculated 121 (H) 0 - 99 mg/dL   Chol/HDL Ratio 4.7 (H) 0.0 - 4.4 ratio units    Comment:                                   T.  Chol/HDL Ratio                                             Men  Women                               1/2 Avg.Risk  3.4    3.3                                   Avg.Risk  5.0    4.4                                2X Avg.Risk  9.6    7.1                                3X Avg.Risk 23.4   11.0   C-peptide     Status: Abnormal   Collection Time: 09/20/15 11:16 AM  Result Value Ref Range   C-Peptide 4.8 (H) 1.1 - 4.4 ng/mL    Comment: C-Peptide reference interval is for fasting patients.  TSH     Status: None   Collection Time: 09/20/15 11:16 AM  Result Value Ref Range   TSH 0.754 0.450 - 4.500 uIU/mL  Fructosamine     Status: Abnormal   Collection Time: 09/20/15 11:16 AM  Result Value Ref Range   Fructosamine 492 (H) 0 - 285 umol/L    Comment: Published reference interval for apparently healthy subjects between age 55 and 58 is 42 - 285 umol/L and in a poorly controlled diabetic population is 228 - 563 umol/L with a mean of 396 umol/L.   POCT UA - Microalbumin     Status: Abnormal   Collection Time: 10/23/15 10:35 AM  Result Value Ref Range   Microalbumin Ur, POC 50 mg/L   Creatinine, POC  mg/dL   Albumin/Creatinine Ratio, Urine, POC    POCT HgB A1C     Status: Abnormal   Collection Time: 10/23/15 10:49 AM  Result Value Ref Range   Hemoglobin A1C 8.8   CBC with Differential     Status: None   Collection Time: 11/07/15  5:18 PM  Result Value Ref Range   WBC 5.3 3.6 - 11.0 K/uL   RBC 4.32 3.80 - 5.20 MIL/uL   Hemoglobin  12.9 12.0 - 16.0 g/dL   HCT 39.0 35.0 - 47.0 %   MCV 90.2 80.0 - 100.0 fL   MCH 30.0 26.0 - 34.0 pg   MCHC 33.2 32.0 - 36.0 g/dL   RDW 14.1 11.5 - 14.5 %   Platelets 214 150 - 440 K/uL   Neutrophils Relative % 57 %   Neutro Abs 3.0 1.4 - 6.5 K/uL   Lymphocytes Relative 35 %   Lymphs Abs 1.9 1.0 - 3.6 K/uL   Monocytes Relative 6 %   Monocytes Absolute 0.3 0.2 - 0.9 K/uL   Eosinophils Relative 2 %   Eosinophils Absolute 0.1 0 - 0.7 K/uL   Basophils Relative 0 %   Basophils Absolute 0.0 0 - 0.1 K/uL  Comprehensive metabolic panel     Status: Abnormal   Collection Time:  11/07/15  5:18 PM  Result Value Ref Range   Sodium 143 135 - 145 mmol/L   Potassium 3.5 3.5 - 5.1 mmol/L   Chloride 108 101 - 111 mmol/L   CO2 27 22 - 32 mmol/L   Glucose, Bld 104 (H) 65 - 99 mg/dL   BUN 11 6 - 20 mg/dL   Creatinine, Ser 0.87 0.44 - 1.00 mg/dL   Calcium 9.1 8.9 - 10.3 mg/dL   Total Protein 7.3 6.5 - 8.1 g/dL   Albumin 4.1 3.5 - 5.0 g/dL   AST 26 15 - 41 U/L   ALT 41 14 - 54 U/L   Alkaline Phosphatase 71 38 - 126 U/L   Total Bilirubin 0.5 0.3 - 1.2 mg/dL   GFR calc non Af Amer >60 >60 mL/min   GFR calc Af Amer >60 >60 mL/min    Comment: (NOTE) The eGFR has been calculated using the CKD EPI equation. This calculation has not been validated in all clinical situations. eGFR's persistently <60 mL/min signify possible Chronic Kidney Disease.    Anion gap 8 5 - 15  Lipase, blood     Status: None   Collection Time: 11/07/15  5:18 PM  Result Value Ref Range   Lipase 26 11 - 51 U/L  Urinalysis complete, with microscopic     Status: Abnormal   Collection Time: 11/07/15  5:18 PM  Result Value Ref Range   Color, Urine STRAW (A) YELLOW   APPearance CLEAR (A) CLEAR   Glucose, UA NEGATIVE NEGATIVE mg/dL   Bilirubin Urine NEGATIVE NEGATIVE   Ketones, ur NEGATIVE NEGATIVE mg/dL   Specific Gravity, Urine 1.005 1.005 - 1.030   Hgb urine dipstick 1+ (A) NEGATIVE   pH 6.0 5.0 - 8.0   Protein, ur  NEGATIVE NEGATIVE mg/dL   Nitrite NEGATIVE NEGATIVE   Leukocytes, UA NEGATIVE NEGATIVE   RBC / HPF 0-5 0 - 5 RBC/hpf   WBC, UA 0-5 0 - 5 WBC/hpf   Bacteria, UA RARE (A) NONE SEEN   Squamous Epithelial / LPF 0-5 (A) NONE SEEN   Mucous PRESENT      Assessment & Plan  1. Breast mass, right Detailed imaging ordered.  - MM Digital Diagnostic Bilat; Future - US BREAST LTD UNI RIGHT INC AXILLA; Future  2. Inversion, nipple  - MM Digital Diagnostic Bilat; Future - US BREAST LTD UNI RIGHT INC AXILLA; Future

## 2015-11-09 LAB — GAMMA GT: GGT: 15 IU/L (ref 0–60)

## 2015-11-09 LAB — SYPHILIS: RPR W/REFLEX TO RPR TITER AND TREPONEMAL ANTIBODIES, TRADITIONAL SCREENING AND DIAGNOSIS ALGORITHM: RPR Ser Ql: NONREACTIVE

## 2015-11-09 LAB — HEPATITIS A ANTIBODY, TOTAL: Hep A Total Ab: POSITIVE — AB

## 2015-11-09 LAB — HEPATITIS C ANTIBODY: Hep C Virus Ab: <0.1 {s_co_ratio} (ref 0.0–0.9)

## 2015-11-09 LAB — HIV ANTIBODY (ROUTINE TESTING W REFLEX): HIV SCREEN 4TH GENERATION: NONREACTIVE

## 2015-11-09 LAB — AST: AST: 23 IU/L (ref 0–40)

## 2015-11-09 LAB — HEPATITIS B SURFACE ANTIBODY,QUALITATIVE: Hep B Surface Ab, Qual: REACTIVE

## 2015-11-09 LAB — ALT: ALT: 46 IU/L — AB (ref 0–32)

## 2015-11-10 ENCOUNTER — Ambulatory Visit: Payer: Medicare Other | Admitting: Family Medicine

## 2015-11-10 ENCOUNTER — Telehealth: Payer: Self-pay

## 2015-11-10 ENCOUNTER — Other Ambulatory Visit: Payer: Self-pay | Admitting: Family Medicine

## 2015-11-10 DIAGNOSIS — E1165 Type 2 diabetes mellitus with hyperglycemia: Principal | ICD-10-CM

## 2015-11-10 DIAGNOSIS — IMO0001 Reserved for inherently not codable concepts without codable children: Secondary | ICD-10-CM

## 2015-11-10 MED ORDER — ACCU-CHEK SOFT TOUCH LANCETS MISC: 1.0000 | Freq: Three times a day (TID) | Status: DC

## 2015-11-10 NOTE — Telephone Encounter (Signed)
Dr. Nadine Counts verbally called in a new rx for lancets to this patient's pharmacy.

## 2015-11-13 NOTE — Telephone Encounter (Signed)
Pt states a form was faxed here for a DWO to be filled out for the lancets.

## 2015-11-21 ENCOUNTER — Telehealth: Payer: Self-pay | Admitting: Family Medicine

## 2015-11-21 ENCOUNTER — Inpatient Hospital Stay
Admission: RE | Admit: 2015-11-21 | Discharge: 2015-11-21 | Disposition: A | Discharge status: Home / Self Care | Payer: Self-pay | Source: Ambulatory Visit | Attending: *Deleted | Admitting: *Deleted

## 2015-11-21 ENCOUNTER — Other Ambulatory Visit: Payer: Self-pay | Admitting: *Deleted

## 2015-11-21 DIAGNOSIS — F250 Schizoaffective disorder, bipolar type: Secondary | ICD-10-CM | POA: Diagnosis not present

## 2015-11-21 DIAGNOSIS — N6320 Unspecified lump in the left breast, unspecified quadrant: Secondary | ICD-10-CM

## 2015-11-21 DIAGNOSIS — Z9289 Personal history of other medical treatment: Secondary | ICD-10-CM

## 2015-11-21 DIAGNOSIS — Z1231 Encounter for screening mammogram for malignant neoplasm of breast: Secondary | ICD-10-CM

## 2015-11-21 NOTE — Telephone Encounter (Signed)
Orders placed.

## 2015-11-21 NOTE — Telephone Encounter (Signed)
-----   Message from Sherrie Sport sent at 11/21/2015  1:48 PM EST ----- Regarding: Diagnostic mammo order Good afternoon,   Our office [facility]) has received the outside reports from Elizabeth [name]'s prior mammograms and are ready to proceed with scheduling.    Can you also enter on her account an order for an ultrasound of the left breast please? Our radiologists ask for these orders to go along with diagnostic studies so that if they see anything in her mammogram that they can go ahead and proceed with the ultrasound the same day and not have the patient waiting or to bring the pt back in on a separate day.   Thank you so much. Let me know if you have any questions. I will give Elizabeth [name] a call to get her scheduled when we have all of her orders.     Thanks again and have a great day,   Sam

## 2015-11-22 ENCOUNTER — Telehealth: Payer: Self-pay

## 2015-11-22 DIAGNOSIS — M069 Rheumatoid arthritis, unspecified: Secondary | ICD-10-CM | POA: Insufficient documentation

## 2015-11-22 DIAGNOSIS — M2550 Pain in unspecified joint: Secondary | ICD-10-CM | POA: Diagnosis not present

## 2015-11-22 DIAGNOSIS — M7060 Trochanteric bursitis, unspecified hip: Secondary | ICD-10-CM | POA: Insufficient documentation

## 2015-11-22 DIAGNOSIS — Z79899 Other long term (current) drug therapy: Secondary | ICD-10-CM | POA: Diagnosis not present
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
## 2015-11-23 DIAGNOSIS — E119 Type 2 diabetes mellitus without complications: Secondary | ICD-10-CM | POA: Diagnosis not present

## 2015-11-27 ENCOUNTER — Encounter: Payer: Medicare Other | Admitting: Family Medicine

## 2015-11-29 ENCOUNTER — Ambulatory Visit (INDEPENDENT_AMBULATORY_CARE_PROVIDER_SITE_OTHER): Payer: Medicare Other | Admitting: Family Medicine

## 2015-11-29 ENCOUNTER — Encounter: Payer: Self-pay | Admitting: Family Medicine

## 2015-11-29 VITALS — BP 126/78 | HR 102 | Temp 98.2°F | Resp 18 | Wt 218.8 lb

## 2015-11-29 DIAGNOSIS — Z0000 Encounter for general adult medical examination without abnormal findings: Secondary | ICD-10-CM | POA: Insufficient documentation

## 2015-11-29 DIAGNOSIS — Z111 Encounter for screening for respiratory tuberculosis: Secondary | ICD-10-CM | POA: Diagnosis not present

## 2015-11-29 DIAGNOSIS — Z124 Encounter for screening for malignant neoplasm of cervix: Secondary | ICD-10-CM | POA: Diagnosis not present

## 2015-11-29 DIAGNOSIS — Z23 Encounter for immunization: Secondary | ICD-10-CM | POA: Insufficient documentation
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
## 2015-11-30 DIAGNOSIS — Z124 Encounter for screening for malignant neoplasm of cervix: Secondary | ICD-10-CM | POA: Diagnosis not present

## 2015-12-01 ENCOUNTER — Ambulatory Visit
Admission: RE | Admit: 2015-12-01 | Discharge: 2015-12-01 | Disposition: A | Discharge status: Home / Self Care | Payer: Medicare Other | Source: Ambulatory Visit | Attending: Family Medicine | Admitting: Family Medicine

## 2015-12-01 ENCOUNTER — Other Ambulatory Visit: Payer: Self-pay | Admitting: Family Medicine

## 2015-12-01 DIAGNOSIS — N6459 Other signs and symptoms in breast: Secondary | ICD-10-CM

## 2015-12-01 DIAGNOSIS — N6310 Unspecified lump in the right breast, unspecified quadrant: Secondary | ICD-10-CM

## 2015-12-01 DIAGNOSIS — N63 Unspecified lump in breast: Secondary | ICD-10-CM | POA: Diagnosis present

## 2015-12-01 DIAGNOSIS — N6001 Solitary cyst of right breast: Secondary | ICD-10-CM | POA: Insufficient documentation

## 2015-12-01 DIAGNOSIS — Z1231 Encounter for screening mammogram for malignant neoplasm of breast: Secondary | ICD-10-CM

## 2015-12-01 DIAGNOSIS — N6320 Unspecified lump in the left breast, unspecified quadrant: Secondary | ICD-10-CM

## 2015-12-01 DIAGNOSIS — N6011 Diffuse cystic mastopathy of right breast: Secondary | ICD-10-CM | POA: Diagnosis not present

## 2015-12-02 LAB — QUANTIFERON TB GOLD ASSAY (BLOOD)

## 2015-12-02 LAB — QUANTIFERON IN TUBE
QFT TB AG MINUS NIL VALUE: 0 IU/mL
QUANTIFERON NIL VALUE: 0.04 [IU]/mL
QUANTIFERON TB AG VALUE: 0.04 [IU]/mL
QUANTIFERON TB GOLD: NEGATIVE

## 2015-12-07 LAB — PAP IG AND HPV HIGH-RISK
HPV, high-risk: NEGATIVE
PAP SMEAR COMMENT: 0

## 2015-12-14 DIAGNOSIS — F250 Schizoaffective disorder, bipolar type: Secondary | ICD-10-CM | POA: Diagnosis not present

## 2015-12-15 DIAGNOSIS — M7061 Trochanteric bursitis, right hip: Secondary | ICD-10-CM | POA: Diagnosis not present

## 2015-12-15 DIAGNOSIS — M7072 Other bursitis of hip, left hip: Secondary | ICD-10-CM | POA: Diagnosis not present

## 2015-12-29 ENCOUNTER — Ambulatory Visit (INDEPENDENT_AMBULATORY_CARE_PROVIDER_SITE_OTHER): Payer: Medicare Other | Admitting: Surgery

## 2015-12-29 ENCOUNTER — Encounter: Payer: Self-pay | Admitting: Surgery

## 2015-12-29 VITALS — BP 162/107 | HR 87 | Temp 98.7°F | Ht 66.0 in | Wt 214.8 lb

## 2015-12-29 DIAGNOSIS — K5732 Diverticulitis of large intestine without perforation or abscess without bleeding: Secondary | ICD-10-CM | POA: Diagnosis not present

## 2015-12-29 MED ORDER — BISACODYL EC 5 MG PO TBEC: DELAYED_RELEASE_TABLET | ORAL | Status: DC

## 2015-12-29 MED ORDER — POLYETHYLENE GLYCOL 3350 17 GM/SCOOP PO POWD: 1.0000 | Freq: Once | ORAL | Status: DC

## 2015-12-29 NOTE — Progress Notes (Signed)
Outpatient Surgical Follow Up  12/29/2015  Elizabeth [name] is an 50 y.o. female.   CC: acute diverticulitis history  HPI:  This patient with a history of acute diverticulitis 2 wants to have definitive surgery at this point. She has had medical clearance but her blood pressure remains elevated. She denies fevers or chills has had some back pain when she has a bowel movement but no recent flares.  Past Medical History  Diagnosis Date  . Irritable bowel syndrome   . RA (rheumatoid arthritis) ([place])   . Bipolar 1 disorder ([place])   . Insomnia   . GERD (gastroesophageal reflux disease)   . Diverticulosis   . Motion sickness     boats  . Hepatitis     B - "not active"  . Heart murmur     followed by PCP  . Diabetes mellitus without complication [facility])     Past Surgical History  Procedure Laterality Date  . Tubal ligation    . Esophagogastroduodenoscopy (egd) with esophageal dilation    . Cholecystectomy  1997    [place], [place]  . Colonoscopy with propofol N/A 09/07/2015    Procedure: COLONOSCOPY WITH PROPOFOL;  Surgeon: Lucilla Lame, MD;  Location: [place];  Service: Endoscopy;  Laterality: N/A;  . Esophagogastroduodenoscopy (egd) with propofol N/A 09/07/2015    Procedure: ESOPHAGOGASTRODUODENOSCOPY (EGD) WITH PROPOFOL;  Surgeon: Lucilla Lame, MD;  Location: [place];  Service: Endoscopy;  Laterality: N/A;  . Polypectomy N/A 09/07/2015    Procedure: POLYPECTOMY;  Surgeon: Lucilla Lame, MD;  Location: [place];  Service: Endoscopy;  Laterality: N/A;    Family History  Problem Relation Age of Onset  . Hypertension Mother   . Diabetes Mother   . Diverticulosis Mother   . Alcohol abuse Father   . Diverticulosis Maternal Aunt   . Diverticulosis Maternal Grandmother     Social History:  reports that she quit smoking about 7 months ago. Her smoking use included Cigarettes. She smoked 0.50 packs per day. She has never used smokeless tobacco. She reports that  she does not drink alcohol or use illicit drugs.  Allergies:  Allergies  Allergen Reactions  . Penicillins Hives    Medications reviewed.   Review of Systems:   Review of Systems  Constitutional: Negative.   HENT: Negative.   Eyes: Negative.   Respiratory: Negative.   Cardiovascular: Negative.   Gastrointestinal: Negative.   Genitourinary: Negative.   Musculoskeletal: Negative.   Skin: Negative.   Neurological: Negative.   Endo/Heme/Allergies: Negative.   Psychiatric/Behavioral: Negative.      Physical Exam:  BP 162/107 mmHg  Pulse 87  Temp(Src) 98.7 F (37.1 C) (Oral)  Ht 5\' 6"  (1.676 m)  Wt 214 lb 12.8 oz (97.433 kg)  BMI 34.69 kg/m2  LMP 07/09/2014  Physical Exam  Constitutional: She is oriented to person, place, and time and well-developed, well-nourished, and in no distress. No distress.  HENT:  Head: Normocephalic and atraumatic.  Eyes: Pupils are equal, round, and reactive to light. Right eye exhibits no discharge. Left eye exhibits no discharge. No scleral icterus.  Neck: Normal range of motion.  Cardiovascular: Normal rate, regular rhythm and normal heart sounds.   Pulmonary/Chest: Effort normal and breath sounds normal. No respiratory distress. She has no wheezes. She has no rales.  Abdominal: Soft. She exhibits no distension. There is no tenderness. There is no rebound and no guarding.  Musculoskeletal: Normal range of motion. She exhibits no edema.  Lymphadenopathy:  She has no cervical adenopathy.  Neurological: She is alert and oriented to person, place, and time.  Skin: Skin is warm and dry. No rash noted. She is not diaphoretic. No erythema.  Psychiatric: Mood and affect normal.  Vitals reviewed.     No results found for this or any previous visit (from the past 48 hour(s)). No results found.  Assessment/Plan:   history of acute diverticulitis patient wants to discuss definitive therapy.  I described for her the procedure itself and  the options of observation we also discussed risk of bleeding infection and anastomotic leak and colostomy this reviewed for her she understood and agrees to proceed with this plan  Florene Glen, MD, FACS

## 2015-12-29 NOTE — Addendum Note (Signed)
Addended by: Wayna Chalet on: 12/29/2015 10:51 AM   Modules accepted: Orders

## 2016-01-01 ENCOUNTER — Telehealth: Payer: Self-pay | Admitting: Surgery

## 2016-01-01 DIAGNOSIS — Z79899 Other long term (current) drug therapy: Secondary | ICD-10-CM | POA: Diagnosis not present

## 2016-01-01 DIAGNOSIS — M2550 Pain in unspecified joint: Secondary | ICD-10-CM | POA: Diagnosis not present

## 2016-01-01 NOTE — Telephone Encounter (Signed)
Pt advised of pre op date/time and sx date. Sx: 01/24/16 with Dr Cooper--Open Sigmoid colectomy. Pre op: 01/16/16 @ 7:30am--Office.

## 2016-01-02 ENCOUNTER — Telehealth: Payer: Self-pay

## 2016-01-02 NOTE — Telephone Encounter (Signed)
Medications for her bowel prep were e-scribed. Bowel prep information was given to patient when she was in the office on 12/29/2015. Patient understood the instructions.

## 2016-01-03 DIAGNOSIS — M069 Rheumatoid arthritis, unspecified: Secondary | ICD-10-CM | POA: Diagnosis not present

## 2016-01-03 DIAGNOSIS — Z79899 Other long term (current) drug therapy: Secondary | ICD-10-CM | POA: Diagnosis not present

## 2016-01-03 DIAGNOSIS — G5603 Carpal tunnel syndrome, bilateral upper limbs: Secondary | ICD-10-CM | POA: Insufficient documentation

## 2016-01-03 DIAGNOSIS — M7061 Trochanteric bursitis, right hip: Secondary | ICD-10-CM | POA: Diagnosis not present

## 2016-01-03 DIAGNOSIS — G8929 Other chronic pain: Secondary | ICD-10-CM | POA: Diagnosis not present

## 2016-01-03 DIAGNOSIS — M7541 Impingement syndrome of right shoulder: Secondary | ICD-10-CM | POA: Diagnosis not present

## 2016-01-03 DIAGNOSIS — M25531 Pain in right wrist: Secondary | ICD-10-CM | POA: Diagnosis not present

## 2016-01-03 DIAGNOSIS — M7062 Trochanteric bursitis, left hip: Secondary | ICD-10-CM | POA: Diagnosis not present

## 2016-01-16 ENCOUNTER — Encounter
Admission: RE | Admit: 2016-01-16 | Discharge: 2016-01-16 | Disposition: A | Discharge status: Home / Self Care | Payer: Medicare Other | Source: Ambulatory Visit | Attending: Surgery | Admitting: Surgery

## 2016-01-16 DIAGNOSIS — Z01812 Encounter for preprocedural laboratory examination: Secondary | ICD-10-CM | POA: Insufficient documentation

## 2016-01-16 HISTORY — DX: Depression, unspecified: F32.A

## 2016-01-16 HISTORY — DX: Major depressive disorder, single episode, unspecified: F32.9

## 2016-01-16 LAB — CBC WITH DIFFERENTIAL/PLATELET
BASOS ABS: 0 10*3/uL (ref 0–0.1)
BASOS PCT: 1 %
Eosinophils Absolute: 0.1 10*3/uL (ref 0–0.7)
Eosinophils Relative: 2 %
HEMATOCRIT: 40.2 % (ref 35.0–47.0)
Hemoglobin: 13.6 g/dL (ref 12.0–16.0)
LYMPHS PCT: 31 %
Lymphs Abs: 1.4 10*3/uL (ref 1.0–3.6)
MCH: 30.1 pg (ref 26.0–34.0)
MCHC: 33.7 g/dL (ref 32.0–36.0)
MCV: 89.4 fL (ref 80.0–100.0)
MONO ABS: 0.3 10*3/uL (ref 0.2–0.9)
Monocytes Relative: 6 %
NEUTROS ABS: 2.7 10*3/uL (ref 1.4–6.5)
Neutrophils Relative %: 60 %
PLATELETS: 248 10*3/uL (ref 150–440)
RBC: 4.5 MIL/uL (ref 3.80–5.20)
RDW: 15.1 % — AB (ref 11.5–14.5)
WBC: 4.5 10*3/uL (ref 3.6–11.0)

## 2016-01-16 LAB — COMPREHENSIVE METABOLIC PANEL
ALBUMIN: 4.4 g/dL (ref 3.5–5.0)
ALT: 42 U/L (ref 14–54)
AST: 32 U/L (ref 15–41)
Alkaline Phosphatase: 84 U/L (ref 38–126)
Anion gap: 7 (ref 5–15)
BUN: 13 mg/dL (ref 6–20)
CHLORIDE: 107 mmol/L (ref 101–111)
CO2: 25 mmol/L (ref 22–32)
CREATININE: 0.7 mg/dL (ref 0.44–1.00)
Calcium: 10.1 mg/dL (ref 8.9–10.3)
GFR calc Af Amer: >60 mL/min (ref >60)
GFR calc non Af Amer: >60 mL/min (ref >60)
GLUCOSE: 113 mg/dL — AB (ref 65–99)
POTASSIUM: 4.3 mmol/L (ref 3.5–5.1)
Sodium: 139 mmol/L (ref 135–145)
Total Bilirubin: 0.3 mg/dL (ref 0.3–1.2)
Total Protein: 8 g/dL (ref 6.5–8.1)

## 2016-01-16 LAB — SURGICAL PCR SCREEN
MRSA, PCR: NEGATIVE
Staphylococcus aureus: NEGATIVE

## 2016-01-16 NOTE — Patient Instructions (Signed)
  Your procedure is scheduled on: January 24, 2016 (Wednesday) Report to Day Surgery.[facility]) Second Floor To find out your arrival time please call [phone] between 1PM - 3PM on January 23, 2016 (Tuesday).  Remember: Instructions that are not followed completely may result in serious medical risk, up to and including death, or upon the discretion of your surgeon and anesthesiologist your surgery may need to be rescheduled.    __x__ 1. Do not eat food or drink liquids after midnight. No gum chewing or hard candies.     ____ 2. No Alcohol for 24 hours before or after surgery.   ____ 3. Bring all medications with you on the day of surgery if instructed.    __x__ 4. Notify your doctor if there is any change in your medical condition     (cold, fever, infections).     Do not wear jewelry, make-up, hairpins, clips or nail polish.  Do not wear lotions, powders, or perfumes. You may wear deodorant.  Do not shave 48 hours prior to surgery. Men may shave face and neck.  Do not bring valuables to the hospital.    [facility] is not responsible for any belongings or valuables.               Contacts, dentures or bridgework may not be worn into surgery.  Leave your suitcase in the car. After surgery it may be brought to your room.  For patients admitted to the hospital, discharge time is determined by your                treatment team.   Patients discharged the day of surgery will not be allowed to drive home.   Please read over the following fact sheets that you were given:   MRSA Information and Surgical Site Infection Prevention   _x___ Take these medicines the morning of surgery with A SIP OF WATER:    1. Prevacid (Prevacid at bedtime on February 14)  2. FOLLOW DR Burt Knack COLON PREP INSTRUCTIONS AS ORDERED  3.   4.  5.  6.  _x___ Fleet Enema (as directed)   _x___ Use CHG Soap as directed  ____ Use inhalers on the day of surgery  __x__ Stop metformin 2 days prior to  surgery (Stop Metformin on February 13)    ____ Take 1/2 of usual insulin dose the night before surgery and none on the morning of surgery.   _x___ Stop Coumadin/Plavix/aspirin on (Stop Aspirin now)  _x___ Stop Anti-inflammatories on (NO NSAIDS) Tylenol ok to take for pain if needed (Stop Meloxicam now)   ____ Stop supplements until after surgery.    ____ Bring C-Pap to the hospital.

## 2016-01-17 ENCOUNTER — Encounter: Payer: Self-pay | Admitting: Family Medicine

## 2016-01-17 ENCOUNTER — Ambulatory Visit (INDEPENDENT_AMBULATORY_CARE_PROVIDER_SITE_OTHER): Payer: Medicare Other | Admitting: Family Medicine

## 2016-01-17 ENCOUNTER — Telehealth: Payer: Self-pay | Admitting: Surgery

## 2016-01-17 ENCOUNTER — Other Ambulatory Visit: Payer: Self-pay

## 2016-01-17 VITALS — BP 120/98 | HR 110 | Temp 98.3°F | Resp 16 | Wt 207.9 lb

## 2016-01-17 DIAGNOSIS — IMO0001 Reserved for inherently not codable concepts without codable children: Secondary | ICD-10-CM

## 2016-01-17 DIAGNOSIS — E1165 Type 2 diabetes mellitus with hyperglycemia: Principal | ICD-10-CM

## 2016-01-17 DIAGNOSIS — I10 Essential (primary) hypertension: Secondary | ICD-10-CM | POA: Diagnosis not present

## 2016-01-17 DIAGNOSIS — E1159 Type 2 diabetes mellitus with other circulatory complications: Secondary | ICD-10-CM | POA: Insufficient documentation

## 2016-01-17 HISTORY — DX: Essential (primary) hypertension: I10

## 2016-01-17 MED ORDER — ENALAPRIL MALEATE 5 MG PO TABS: 5.0000 mg | ORAL_TABLET | Freq: Every day | ORAL | Status: DC

## 2016-01-17 MED ORDER — LINAGLIPTIN 5 MG PO TABS: 5.0000 mg | ORAL_TABLET | Freq: Every day | ORAL | Status: DC

## 2016-01-17 NOTE — Addendum Note (Signed)
Addended by: Bobetta Lime on: 01/17/2016 10:59 AM   Modules accepted: Miquel Dunn

## 2016-01-17 NOTE — Telephone Encounter (Signed)
Please call patient, she has a question about the medication she is supposed to take prior to her Partial Colectomy/Sigmoid surgery on 2/15 with Dr Burt Knack.  She was given differing instructions on what day to take the medication. Please call and advise. Thanks.

## 2016-01-17 NOTE — Progress Notes (Addendum)
Name: Elizabeth [name]   MRN: [id number]    DOB: 1966-11-13   Date:01/17/2016       Progress Note  Subjective  Chief Complaint  Chief Complaint  Patient presents with  . Hypertension    went to pre-op yesterday for her upcoming surgery 2/15 and they state needed to see PCP for HTN  . Diabetes    needs refill on Tradjenta    HPI  Elizabeth [name] is a 50 year old female with DM II, RA, IBS, Bipolar disorder, GERD amongst many medical issues here today to get her BP under better control prior to surgery. Diastolic BP tends to be elevated at times. During pre-op her BPs were 140/90s. No symptoms of chest pain, dizziness, focal neurological deficits, palpitations etc.   On 01/24/16 she is to have Open Sigmoid Colectomy with Dr. Burt Knack due to frequent inflammatory diverticulitis.   Past Medical History  Diagnosis Date  . Irritable bowel syndrome   . RA (rheumatoid arthritis) ([place])   . Bipolar 1 disorder ([place])   . Insomnia   . GERD (gastroesophageal reflux disease)   . Diverticulosis   . Motion sickness     boats  . Hepatitis     B - "not active"  . Heart murmur     followed by PCP  . Diabetes mellitus without complication ([place])   . Depression     Patient Active Problem List   Diagnosis Date Noted  . Annual physical exam 11/29/2015  . Encounter for screening for malignant neoplasm of cervix 11/29/2015  . Need for diphtheria-tetanus-pertussis (Tdap) vaccine, adult/adolescent 11/29/2015  . Screening for tuberculosis 11/29/2015  . Bursitis, trochanteric 11/22/2015  . Breast mass, right 11/08/2015  . Inversion, nipple 11/08/2015  . Diabetes mellitus type 2, uncontrolled, without complications ([place]) 15/40/0867  . Elevated liver enzymes 10/23/2015  . RA (rheumatoid arthritis) ([place]) 10/23/2015  . Rheumatoid arthritis involving both hands ([place]) 10/23/2015  . Screening for STD (sexually transmitted disease) 10/23/2015  . HIV exposure 10/23/2015  . Trouble swallowing   . Special screening  for malignant neoplasms, colon   . Benign neoplasm of descending colon   . Benign neoplasm of sigmoid colon   . Bipolar disorder, current episode depressed, moderate ([place]) 07/03/2015  . Chronic rheumatic arthritis ([place]) 07/03/2015  . IBS (irritable bowel syndrome) 07/03/2015  . Insomnia, uncontrolled 07/03/2015  . GERD without esophagitis 07/03/2015  . Obesity, Class II, BMI 35-39.9 07/03/2015  . Bilateral hip bursitis 07/03/2015  . Diverticulitis large intestine 05/07/2015    Social History  Substance Use Topics  . Smoking status: Former Smoker -- 0.50 packs/day    Types: Cigarettes    Quit date: 05/06/2015  . Smokeless tobacco: Never Used  . Alcohol Use: No     Current outpatient prescriptions:  .  aspirin EC 81 MG tablet, Take 81 mg by mouth daily., Disp: , Rfl:  .  B-D INS SYR ULTRAFINE 1CC/31G 31G X 5/16" 1 ML MISC, use to inject METHOTREXATE, Disp: , Rfl: 0 .  BISACODYL 5 MG EC tablet, Take 4 tablets at 8AM the day of your surgery., Disp: 4 tablet, Rfl: 0 .  Calcium Carbonate-Vitamin D (CALCIUM-VITAMIN D) 500-200 MG-UNIT tablet, Take 2 tablets by mouth daily. , Disp: , Rfl:  .  folic acid (FOLVITE) 1 MG tablet, Take 1 mg by mouth daily., Disp: , Rfl: 0 .  glucose blood (ACCU-CHEK SMARTVIEW) test strip, Use as instructed, Disp: 100 each, Rfl: 12 .  Homeopathic Products (SIMILASAN DRY EYE  RELIEF) SOLN, Apply 2 drops to eye as needed (to each eye). , Disp: , Rfl:  .  Lancets (ACCU-CHEK SOFT TOUCH) lancets, 1 each by Other route 3 (three) times daily., Disp: 100 each, Rfl: 12 .  lansoprazole (PREVACID) 30 MG capsule, take 1 capsule by mouth once daily 30 MINUTES PRIOR TO BREAKFAST, Disp: , Rfl: 1 .  linagliptin (TRADJENTA) 5 MG TABS tablet, Take 1 tablet (5 mg total) by mouth daily., Disp: 90 tablet, Rfl: 3 .  LINZESS 290 MCG CAPS capsule, Take 290 mcg by mouth every morning., Disp: , Rfl: 1 .  meloxicam (MOBIC) 15 MG tablet, Take 15 mg by mouth daily., Disp: , Rfl: 0 .   metFORMIN (GLUCOPHAGE) 1000 MG tablet, Take 1 tablet (1,000 mg total) by mouth 2 (two) times daily with a meal., Disp: 180 tablet, Rfl: 3 .  methotrexate 50 MG/2ML injection, inject 0.6 milliliters subcutaneously every week, Disp: , Rfl: 0 .  OLANZapine (ZYPREXA) 15 MG tablet, Take 15 mg by mouth at bedtime. , Disp: , Rfl: 0 .  polyethylene glycol powder (GLYCOLAX/MIRALAX) powder, Take 255 g by mouth once., Disp: 255 g, Rfl: 0 .  traZODone (DESYREL) 100 MG tablet, Take 100 mg by mouth at bedtime as needed. , Disp: , Rfl:   Past Surgical History  Procedure Laterality Date  . Tubal ligation    . Esophagogastroduodenoscopy (egd) with esophageal dilation    . Cholecystectomy  1997    [place], [place]  . Colonoscopy with propofol N/A 09/07/2015    Procedure: COLONOSCOPY WITH PROPOFOL;  Surgeon: Lucilla Lame, MD;  Location: [facility];  Service: Endoscopy;  Laterality: N/A;  . Esophagogastroduodenoscopy (egd) with propofol N/A 09/07/2015    Procedure: ESOPHAGOGASTRODUODENOSCOPY (EGD) WITH PROPOFOL;  Surgeon: Lucilla Lame, MD;  Location: [facility];  Service: Endoscopy;  Laterality: N/A;  . Polypectomy N/A 09/07/2015    Procedure: POLYPECTOMY;  Surgeon: Lucilla Lame, MD;  Location: [facility];  Service: Endoscopy;  Laterality: N/A;    Family History  Problem Relation Age of Onset  . Hypertension Mother   . Diabetes Mother   . Diverticulosis Mother   . Alcohol abuse Father   . Diverticulosis Maternal Aunt   . Diverticulosis Maternal Grandmother     Allergies  Allergen Reactions  . Penicillins Hives     Review of Systems  CONSTITUTIONAL: Purposeful weight loss. No fever, chills, weakness or fatigue.   CARDIOVASCULAR: No chest pain, chest pressure or chest discomfort. No palpitations or edema.  RESPIRATORY: No shortness of breath, cough or sputum.  GASTROINTESTINAL: No anorexia, nausea, vomiting. No changes in bowel habits. Chronic intermitent abdominal pain or blood.   GENITOURINARY: No dysuria. No frequency. No discharge. NEUROLOGICAL: No headache, dizziness, syncope, paralysis, ataxia, numbness or tingling in the extremities. No memory changes. No change in bowel or bladder control.  MUSCULOSKELETAL: Chronic joint pain. No muscle pain. HEMATOLOGIC: No anemia, bleeding or bruising.  LYMPHATICS: No enlarged lymph nodes.  PSYCHIATRIC: No change in mood. No change in sleep pattern.  ENDOCRINOLOGIC: No reports of sweating, cold or heat intolerance. No polyuria or polydipsia.     Objective  BP 120/98 mmHg  Pulse 110  Temp(Src) 98.3 F (36.8 C) (Oral)  Resp 16  Wt 207 lb 14.4 oz (94.303 kg)  SpO2 98%  LMP 07/09/2014 Body mass index is 32.55 kg/(m^2).  Physical Exam  Constitutional: Patient appears well-developed and well-nourished. In no distress.  HEENT:  - Head: Normocephalic and atraumatic.  - Ears: Bilateral TMs  gray, no erythema or effusion - Nose: Nasal mucosa moist - Mouth/Throat: Oropharynx is clear and moist. No tonsillar hypertrophy or erythema. No post nasal drainage.  - Eyes: Conjunctivae clear, EOM movements normal. PERRLA. No scleral icterus.  Neck: Normal range of motion. Neck supple. No JVD present. No thyromegaly present.  Cardiovascular: Normal rate, regular rhythm and normal heart sounds.  No murmur heard.  Pulmonary/Chest: Effort normal and breath sounds normal. No respiratory distress. Musculoskeletal: Normal range of motion bilateral UE and LE, no joint effusions. Peripheral vascular: Bilateral LE no edema. Neurological: CN II-XII grossly intact with no focal deficits. Alert and oriented to person, place, and time. Coordination, balance, strength, speech and gait are normal.  Skin: Skin is warm and dry. No rash noted. No erythema.  Psychiatric: Patient has a normal mood and affect. Behavior is normal in office today. Judgment and thought content normal in office today.   Recent Results (from the past 2160 hour(s))  POCT  UA - Microalbumin     Status: Abnormal   Collection Time: 10/23/15 10:35 AM  Result Value Ref Range   Microalbumin Ur, POC 50 mg/L   Creatinine, POC  mg/dL   Albumin/Creatinine Ratio, Urine, POC    POCT HgB A1C     Status: Abnormal   Collection Time: 10/23/15 10:49 AM  Result Value Ref Range   Hemoglobin A1C 8.8   CBC with Differential     Status: None   Collection Time: 11/07/15  5:18 PM  Result Value Ref Range   WBC 5.3 3.6 - 11.0 K/uL   RBC 4.32 3.80 - 5.20 MIL/uL   Hemoglobin 12.9 12.0 - 16.0 g/dL   HCT 39.0 35.0 - 47.0 %   MCV 90.2 80.0 - 100.0 fL   MCH 30.0 26.0 - 34.0 pg   MCHC 33.2 32.0 - 36.0 g/dL   RDW 14.1 11.5 - 14.5 %   Platelets 214 150 - 440 K/uL   Neutrophils Relative % 57 %   Neutro Abs 3.0 1.4 - 6.5 K/uL   Lymphocytes Relative 35 %   Lymphs Abs 1.9 1.0 - 3.6 K/uL   Monocytes Relative 6 %   Monocytes Absolute 0.3 0.2 - 0.9 K/uL   Eosinophils Relative 2 %   Eosinophils Absolute 0.1 0 - 0.7 K/uL   Basophils Relative 0 %   Basophils Absolute 0.0 0 - 0.1 K/uL  Comprehensive metabolic panel     Status: Abnormal   Collection Time: 11/07/15  5:18 PM  Result Value Ref Range   Sodium 143 135 - 145 mmol/L   Potassium 3.5 3.5 - 5.1 mmol/L   Chloride 108 101 - 111 mmol/L   CO2 27 22 - 32 mmol/L   Glucose, Bld 104 (H) 65 - 99 mg/dL   BUN 11 6 - 20 mg/dL   Creatinine, Ser 0.87 0.44 - 1.00 mg/dL   Calcium 9.1 8.9 - 10.3 mg/dL   Total Protein 7.3 6.5 - 8.1 g/dL   Albumin 4.1 3.5 - 5.0 g/dL   AST 26 15 - 41 U/L   ALT 41 14 - 54 U/L   Alkaline Phosphatase 71 38 - 126 U/L   Total Bilirubin 0.5 0.3 - 1.2 mg/dL   GFR calc non Af Amer >60 >60 mL/min   GFR calc Af Amer >60 >60 mL/min    Comment: (NOTE) The eGFR has been calculated using the CKD EPI equation. This calculation has not been validated in all clinical situations. eGFR's persistently <60 mL/min signify possible Chronic  Kidney Disease.    Anion gap 8 5 - 15  Lipase, blood     Status: None   Collection  Time: 11/07/15  5:18 PM  Result Value Ref Range   Lipase 26 11 - 51 U/L  Urinalysis complete, with microscopic     Status: Abnormal   Collection Time: 11/07/15  5:18 PM  Result Value Ref Range   Color, Urine STRAW (A) YELLOW   APPearance CLEAR (A) CLEAR   Glucose, UA NEGATIVE NEGATIVE mg/dL   Bilirubin Urine NEGATIVE NEGATIVE   Ketones, ur NEGATIVE NEGATIVE mg/dL   Specific Gravity, Urine 1.005 1.005 - 1.030   Hgb urine dipstick 1+ (A) NEGATIVE   pH 6.0 5.0 - 8.0   Protein, ur NEGATIVE NEGATIVE mg/dL   Nitrite NEGATIVE NEGATIVE   Leukocytes, UA NEGATIVE NEGATIVE   RBC / HPF 0-5 0 - 5 RBC/hpf   WBC, UA 0-5 0 - 5 WBC/hpf   Bacteria, UA RARE (A) NONE SEEN   Squamous Epithelial / LPF 0-5 (A) NONE SEEN   Mucous PRESENT   Gamma GT     Status: None   Collection Time: 11/08/15  3:09 PM  Result Value Ref Range   GGT 15 0 - 60 IU/L  AST     Status: None   Collection Time: 11/08/15  3:09 PM  Result Value Ref Range   AST 23 0 - 40 IU/L  ALT     Status: Abnormal   Collection Time: 11/08/15  3:09 PM  Result Value Ref Range   ALT 46 (H) 0 - 32 IU/L  Hepatitis A antibody, total     Status: Abnormal   Collection Time: 11/08/15  3:09 PM  Result Value Ref Range   Hep A Total Ab Positive (A) Negative  Hepatitis B surface antibody     Status: None   Collection Time: 11/08/15  3:09 PM  Result Value Ref Range   Hep B Surface Ab, Qual Reactive     Comment:               Non Reactive: Inconsistent with immunity,                             less than 10 mIU/mL               Reactive:     Consistent with immunity,                             greater than 9.9 mIU/mL   Hepatitis C antibody     Status: None   Collection Time: 11/08/15  3:09 PM  Result Value Ref Range   Hep C Virus Ab <0.1 0.0 - 0.9 s/co ratio    Comment:                                   Negative:     < 0.8                              Indeterminate: 0.8 - 0.9                                   Positive:     >  0.9  The CDC  recommends that a positive HCV antibody result  be followed up with a HCV Nucleic Acid Amplification  test ([id number]).   HIV antibody     Status: None   Collection Time: 11/08/15  3:09 PM  Result Value Ref Range   HIV Screen 4th Generation wRfx Non Reactive Non Reactive  RPR     Status: None   Collection Time: 11/08/15  3:09 PM  Result Value Ref Range   RPR Ser Ql Non Reactive Non Reactive  Quantiferon tb gold assay     Status: None   Collection Time: 11/29/15  3:58 PM  Result Value Ref Range   QUANTIFERON INCUBATION Comment     Comment: Specimen incubated at [facility], [place], [place].  QuantiFERON In Tube     Status: None   Collection Time: 11/29/15  3:58 PM  Result Value Ref Range   QUANTIFERON TB GOLD Negative Negative   QUANTIFERON CRITERIA Comment     Comment: To be considered positive a specimen should have a TB Ag minus Nil value greater than or equal to 0.35 IU/mL and in addition the TB Ag minus Nil value must be greater than or equal to 25% of the Nil value. There may be insufficient information in these values to differentiate between some negative and some indeterminate test values.    QUANTIFERON TB AG VALUE 0.04 IU/mL   Quantiferon Nil Value 0.04 IU/mL   QUANTIFERON MITOGEN VALUE >10.00 IU/mL   QFT TB AG MINUS NIL VALUE 0.00 IU/mL   Interpretation: Comment     Comment: The QuantiFERON TB Gold (in Tube) assay is intended for use as an aid in the diagnosis of TB infection. Negative results suggest that there is no TB infection. In patients with high suspicion of exposure, a negative test should be repeated. A positive test indicates infection with Mycobacterium tuberculosis. Among individuals without tuberculosis infection, a positive test may be due to exposure to [place], M. szulgai or M. marinum. On the Internet, go to https://figueroa-lambert.info/ for further details.   Pap IG and HPV (high risk) DNA detection     Status: None   Collection Time: 11/30/15 12:00 AM  Result  Value Ref Range   DIAGNOSIS: Comment     Comment: NEGATIVE FOR INTRAEPITHELIAL LESION AND MALIGNANCY.   Specimen adequacy: Comment     Comment: Satisfactory for evaluation. Endocervical and/or squamous metaplastic cells (endocervical component) are present.    CLINICIAN PROVIDED ICD10: Comment     Comment: Z12.4   Performed by: Comment     Comment: Marlynn Perking, Cytotechnologist (ASCP)   PAP SMEAR COMMENT .    Note: Comment     Comment: The Pap smear is a screening test designed to aid in the detection of premalignant and malignant conditions of the uterine cervix.  It is not a diagnostic procedure and should not be used as the sole means of detecting cervical cancer.  Both false-positive and false-negative reports do occur.    Test Methodology Comment     Comment: This liquid based ThinPrep(R) pap test was screened with the use of an image guided system.    HPV, high-risk Negative Negative    Comment: This high-risk HPV test detects thirteen high-risk types (16/18/31/33/35/39/45/51/52/56/58/59/68) without differentiation.   CBC WITH DIFFERENTIAL     Status: Abnormal   Collection Time: 01/16/16  9:07 AM  Result Value Ref Range   WBC 4.5 3.6 - 11.0 K/uL   RBC 4.50 3.80 - 5.20 MIL/uL  Hemoglobin 13.6 12.0 - 16.0 g/dL   HCT 40.2 35.0 - 47.0 %   MCV 89.4 80.0 - 100.0 fL   MCH 30.1 26.0 - 34.0 pg   MCHC 33.7 32.0 - 36.0 g/dL   RDW 15.1 (H) 11.5 - 14.5 %   Platelets 248 150 - 440 K/uL   Neutrophils Relative % 60 %   Neutro Abs 2.7 1.4 - 6.5 K/uL   Lymphocytes Relative 31 %   Lymphs Abs 1.4 1.0 - 3.6 K/uL   Monocytes Relative 6 %   Monocytes Absolute 0.3 0.2 - 0.9 K/uL   Eosinophils Relative 2 %   Eosinophils Absolute 0.1 0 - 0.7 K/uL   Basophils Relative 1 %   Basophils Absolute 0.0 0 - 0.1 K/uL  Comprehensive metabolic panel     Status: Abnormal   Collection Time: 01/16/16  9:07 AM  Result Value Ref Range   Sodium 139 135 - 145 mmol/L   Potassium 4.3 3.5 - 5.1  mmol/L   Chloride 107 101 - 111 mmol/L   CO2 25 22 - 32 mmol/L   Glucose, Bld 113 (H) 65 - 99 mg/dL   BUN 13 6 - 20 mg/dL   Creatinine, Ser 0.70 0.44 - 1.00 mg/dL   Calcium 10.1 8.9 - 10.3 mg/dL   Total Protein 8.0 6.5 - 8.1 g/dL   Albumin 4.4 3.5 - 5.0 g/dL   AST 32 15 - 41 U/L   ALT 42 14 - 54 U/L   Alkaline Phosphatase 84 38 - 126 U/L   Total Bilirubin 0.3 0.3 - 1.2 mg/dL   GFR calc non Af Amer >60 >60 mL/min   GFR calc Af Amer >60 >60 mL/min    Comment: (NOTE) The eGFR has been calculated using the CKD EPI equation. This calculation has not been validated in all clinical situations. eGFR's persistently <60 mL/min signify possible Chronic Kidney Disease.    Anion gap 7 5 - 15  Surgical pcr screen     Status: None   Collection Time: 01/16/16  9:07 AM  Result Value Ref Range   MRSA, PCR NEGATIVE NEGATIVE   Staphylococcus aureus NEGATIVE NEGATIVE    Comment:        The Xpert SA Assay (FDA approved for NASAL specimens in patients over 71 years of age), is one component of a comprehensive surveillance program.  Test performance has been validated by [facility] for patients greater than or equal to 54 year old. It is not intended to diagnose infection nor to guide or monitor treatment.      Assessment & Plan  1. Uncontrolled type 2 diabetes mellitus without complication, without long-term current use of insulin (HCC) Refilled.   - linagliptin (TRADJENTA) 5 MG TABS tablet; Take 1 tablet (5 mg total) by mouth daily.  Dispense: 90 tablet; Refill: 1  2. Hypertension goal BP (blood pressure) < 140/90 Decided on ACEi due to DM II, discussed risks and benefits including angioedema potential.   The patient has been counseled on the proper use, side effects and potential interactions of the new medication. Patient encouraged to review the side effects and safety profile pamphlet provided with the prescription from the pharmacy as well as request counseling from the pharmacy  team as needed.    - enalapril (VASOTEC) 5 MG tablet; Take 1 tablet (5 mg total) by mouth daily.  Dispense: 90 tablet; Refill: 1

## 2016-01-18 NOTE — Telephone Encounter (Signed)
Called patient back and her question was if she takes her prep the day of surgery or the day before. I told her that she would have to take them the day before. Patient understood and had no further questions. However, I told her that if she had further questions, to call me back.

## 2016-01-24 ENCOUNTER — Encounter
Admission: RE | Disposition: A | Discharge status: Home / Self Care | Payer: Self-pay | Source: Ambulatory Visit | Attending: Surgery

## 2016-01-24 ENCOUNTER — Inpatient Hospital Stay
Admission: RE | Admit: 2016-01-24 | Discharge: 2016-01-27 | DRG: 331 | Disposition: A | Discharge status: Home / Self Care | Payer: Medicare Other | Source: Ambulatory Visit | Attending: Surgery | Admitting: Surgery

## 2016-01-24 ENCOUNTER — Encounter: Payer: Self-pay | Admitting: *Deleted

## 2016-01-24 ENCOUNTER — Inpatient Hospital Stay: Payer: Medicare Other | Admitting: Anesthesiology

## 2016-01-24 DIAGNOSIS — Z880 Allergy status to penicillin: Secondary | ICD-10-CM

## 2016-01-24 DIAGNOSIS — K219 Gastro-esophageal reflux disease without esophagitis: Secondary | ICD-10-CM | POA: Diagnosis present

## 2016-01-24 DIAGNOSIS — Z8619 Personal history of other infectious and parasitic diseases: Secondary | ICD-10-CM | POA: Diagnosis not present

## 2016-01-24 DIAGNOSIS — Z9049 Acquired absence of other specified parts of digestive tract: Secondary | ICD-10-CM | POA: Diagnosis present

## 2016-01-24 DIAGNOSIS — K5730 Diverticulosis of large intestine without perforation or abscess without bleeding: Secondary | ICD-10-CM | POA: Diagnosis not present

## 2016-01-24 DIAGNOSIS — E119 Type 2 diabetes mellitus without complications: Secondary | ICD-10-CM | POA: Diagnosis present

## 2016-01-24 DIAGNOSIS — F319 Bipolar disorder, unspecified: Secondary | ICD-10-CM | POA: Diagnosis present

## 2016-01-24 DIAGNOSIS — F329 Major depressive disorder, single episode, unspecified: Secondary | ICD-10-CM | POA: Diagnosis not present

## 2016-01-24 DIAGNOSIS — K589 Irritable bowel syndrome without diarrhea: Secondary | ICD-10-CM | POA: Diagnosis present

## 2016-01-24 DIAGNOSIS — M069 Rheumatoid arthritis, unspecified: Secondary | ICD-10-CM | POA: Diagnosis present

## 2016-01-24 DIAGNOSIS — K5792 Diverticulitis of intestine, part unspecified, without perforation or abscess without bleeding: Secondary | ICD-10-CM | POA: Diagnosis not present

## 2016-01-24 HISTORY — PX: PARTIAL COLECTOMY: SHX5273

## 2016-01-24 LAB — GLUCOSE, CAPILLARY
GLUCOSE-CAPILLARY: 137 mg/dL — AB (ref 65–99)
Glucose-Capillary: 178 mg/dL — ABNORMAL HIGH (ref 65–99)

## 2016-01-24 SURGERY — COLECTOMY, PARTIAL
Anesthesia: General

## 2016-01-24 MED ORDER — LACTATED RINGERS IV BOLUS (SEPSIS)
1000.0000 mL | Freq: Once | INTRAVENOUS | Status: AC
  Administered 2016-01-24: 1000 mL via INTRAVENOUS

## 2016-01-24 MED ORDER — METFORMIN HCL 500 MG PO TABS
1000.0000 mg | ORAL_TABLET | Freq: Two times a day (BID) | ORAL | Status: DC
  Administered 2016-01-24 – 2016-01-27 (×5): 1000 mg via ORAL
  Filled 2016-01-24 (×6): qty 2

## 2016-01-24 MED ORDER — DIPHENHYDRAMINE HCL 50 MG/ML IJ SOLN: 12.5000 mg | Freq: Four times a day (QID) | INTRAMUSCULAR | Status: DC | PRN

## 2016-01-24 MED ORDER — BUPIVACAINE-EPINEPHRINE (PF) 0.25% -1:200000 IJ SOLN
INTRAMUSCULAR | Status: DC | PRN
  Administered 2016-01-24: 30 mL

## 2016-01-24 MED ORDER — SUCCINYLCHOLINE CHLORIDE 20 MG/ML IJ SOLN
INTRAMUSCULAR | Status: DC | PRN
  Administered 2016-01-24: 100 mg via INTRAVENOUS

## 2016-01-24 MED ORDER — ROCURONIUM BROMIDE 100 MG/10ML IV SOLN
INTRAVENOUS | Status: DC | PRN
  Administered 2016-01-24 (×3): 10 mg via INTRAVENOUS
  Administered 2016-01-24: 40 mg via INTRAVENOUS

## 2016-01-24 MED ORDER — MORPHINE SULFATE (PF) 2 MG/ML IV SOLN
2.0000 mg | INTRAVENOUS | Status: DC | PRN
  Administered 2016-01-24: 2 mg via INTRAVENOUS
  Filled 2016-01-24: qty 1

## 2016-01-24 MED ORDER — PHENYLEPHRINE HCL 10 MG/ML IJ SOLN
INTRAMUSCULAR | Status: DC | PRN
  Administered 2016-01-24 (×2): 100 ug via INTRAVENOUS

## 2016-01-24 MED ORDER — HEPARIN SODIUM (PORCINE) 5000 UNIT/ML IJ SOLN
5000.0000 [IU] | Freq: Three times a day (TID) | INTRAMUSCULAR | Status: DC
  Administered 2016-01-24 – 2016-01-27 (×9): 5000 [IU] via SUBCUTANEOUS
  Filled 2016-01-24 (×9): qty 1

## 2016-01-24 MED ORDER — FENTANYL CITRATE (PF) 100 MCG/2ML IJ SOLN
INTRAMUSCULAR | Status: AC
  Administered 2016-01-24: 25 ug via INTRAVENOUS
  Filled 2016-01-24: qty 2

## 2016-01-24 MED ORDER — PANTOPRAZOLE SODIUM 20 MG PO TBEC
20.0000 mg | DELAYED_RELEASE_TABLET | Freq: Every day | ORAL | Status: DC
  Filled 2016-01-24: qty 1

## 2016-01-24 MED ORDER — LIDOCAINE HCL (CARDIAC) 20 MG/ML IV SOLN
INTRAVENOUS | Status: DC | PRN
  Administered 2016-01-24: 80 mg via INTRAVENOUS

## 2016-01-24 MED ORDER — SODIUM CHLORIDE 0.9 % IV SOLN
1.0000 g | INTRAVENOUS | Status: AC
  Administered 2016-01-24: 1 g via INTRAVENOUS
  Administered 2016-01-24: 08:00:00 via INTRAVENOUS
  Filled 2016-01-24: qty 1

## 2016-01-24 MED ORDER — NALOXONE HCL 0.4 MG/ML IJ SOLN: 0.4000 mg | INTRAMUSCULAR | Status: DC | PRN

## 2016-01-24 MED ORDER — ENALAPRIL MALEATE 5 MG PO TABS
5.0000 mg | ORAL_TABLET | Freq: Every day | ORAL | Status: DC
  Administered 2016-01-25 – 2016-01-26 (×2): 5 mg via ORAL
  Filled 2016-01-24 (×3): qty 1

## 2016-01-24 MED ORDER — LINACLOTIDE 290 MCG PO CAPS
290.0000 ug | ORAL_CAPSULE | Freq: Every day | ORAL | Status: DC
  Administered 2016-01-24 – 2016-01-26 (×3): 290 ug via ORAL
  Filled 2016-01-24 (×4): qty 1

## 2016-01-24 MED ORDER — MORPHINE SULFATE 2 MG/ML IV SOLN
INTRAVENOUS | Status: DC
  Administered 2016-01-24: 6 mg via INTRAVENOUS
  Filled 2016-01-24: qty 25

## 2016-01-24 MED ORDER — FENTANYL CITRATE (PF) 100 MCG/2ML IJ SOLN
25.0000 ug | INTRAMUSCULAR | Status: DC | PRN
  Administered 2016-01-24 (×3): 25 ug via INTRAVENOUS

## 2016-01-24 MED ORDER — DIPHENHYDRAMINE HCL 12.5 MG/5ML PO ELIX: 12.5000 mg | ORAL_SOLUTION | Freq: Four times a day (QID) | ORAL | Status: DC | PRN

## 2016-01-24 MED ORDER — OLANZAPINE 7.5 MG PO TABS
15.0000 mg | ORAL_TABLET | Freq: Every day | ORAL | Status: DC
  Filled 2016-01-24 (×3): qty 2

## 2016-01-24 MED ORDER — ASPIRIN EC 81 MG PO TBEC
81.0000 mg | DELAYED_RELEASE_TABLET | Freq: Every day | ORAL | Status: DC
  Administered 2016-01-24 – 2016-01-26 (×3): 81 mg via ORAL
  Filled 2016-01-24 (×4): qty 1

## 2016-01-24 MED ORDER — CETYLPYRIDINIUM CHLORIDE 0.05 % MT LIQD
7.0000 mL | Freq: Two times a day (BID) | OROMUCOSAL | Status: DC
  Administered 2016-01-25 – 2016-01-26 (×3): 7 mL via OROMUCOSAL

## 2016-01-24 MED ORDER — HEPARIN SODIUM (PORCINE) 5000 UNIT/ML IJ SOLN
5000.0000 [IU] | Freq: Once | INTRAMUSCULAR | Status: AC
  Administered 2016-01-24: 5000 [IU] via SUBCUTANEOUS

## 2016-01-24 MED ORDER — MIDAZOLAM HCL 5 MG/5ML IJ SOLN
INTRAMUSCULAR | Status: DC | PRN
  Administered 2016-01-24: 2 mg via INTRAVENOUS

## 2016-01-24 MED ORDER — BUPIVACAINE-EPINEPHRINE (PF) 0.25% -1:200000 IJ SOLN
INTRAMUSCULAR | Status: AC
  Filled 2016-01-24: qty 30

## 2016-01-24 MED ORDER — PROPOFOL 10 MG/ML IV BOLUS
INTRAVENOUS | Status: DC | PRN
  Administered 2016-01-24: 200 mg via INTRAVENOUS

## 2016-01-24 MED ORDER — ONDANSETRON HCL 4 MG/2ML IJ SOLN: 4.0000 mg | Freq: Four times a day (QID) | INTRAMUSCULAR | Status: DC | PRN

## 2016-01-24 MED ORDER — PANTOPRAZOLE SODIUM 40 MG PO TBEC
40.0000 mg | DELAYED_RELEASE_TABLET | Freq: Every day | ORAL | Status: DC
  Administered 2016-01-25 – 2016-01-26 (×2): 40 mg via ORAL
  Filled 2016-01-24 (×3): qty 1

## 2016-01-24 MED ORDER — KETOROLAC TROMETHAMINE 30 MG/ML IJ SOLN
INTRAMUSCULAR | Status: DC | PRN
  Administered 2016-01-24: 30 mg via INTRAVENOUS

## 2016-01-24 MED ORDER — PROMETHAZINE HCL 25 MG/ML IJ SOLN: 6.2500 mg | INTRAMUSCULAR | Status: DC | PRN

## 2016-01-24 MED ORDER — IPRATROPIUM-ALBUTEROL 0.5-2.5 (3) MG/3ML IN SOLN
RESPIRATORY_TRACT | Status: AC
  Filled 2016-01-24: qty 3

## 2016-01-24 MED ORDER — GLYCOPYRROLATE 0.2 MG/ML IJ SOLN
INTRAMUSCULAR | Status: DC | PRN
  Administered 2016-01-24: 0.2 mg via INTRAVENOUS

## 2016-01-24 MED ORDER — LINAGLIPTIN 5 MG PO TABS
5.0000 mg | ORAL_TABLET | Freq: Every day | ORAL | Status: DC
  Administered 2016-01-24 – 2016-01-26 (×3): 5 mg via ORAL
  Filled 2016-01-24 (×4): qty 1

## 2016-01-24 MED ORDER — FLEET ENEMA 7-19 GM/118ML RE ENEM: 1.0000 | ENEMA | Freq: Once | RECTAL | Status: DC

## 2016-01-24 MED ORDER — SODIUM CHLORIDE 0.9% FLUSH: 9.0000 mL | INTRAVENOUS | Status: DC | PRN

## 2016-01-24 MED ORDER — EPHEDRINE SULFATE 50 MG/ML IJ SOLN
INTRAMUSCULAR | Status: DC | PRN
  Administered 2016-01-24 (×2): 10 mg via INTRAVENOUS

## 2016-01-24 MED ORDER — CEFAZOLIN SODIUM-DEXTROSE 2-3 GM-% IV SOLR
INTRAVENOUS | Status: AC
  Administered 2016-01-24: 12:00:00
  Filled 2016-01-24: qty 50

## 2016-01-24 MED ORDER — ACETAMINOPHEN 10 MG/ML IV SOLN
INTRAVENOUS | Status: AC
  Filled 2016-01-24: qty 100

## 2016-01-24 MED ORDER — FENTANYL CITRATE (PF) 250 MCG/5ML IJ SOLN
INTRAMUSCULAR | Status: DC | PRN
  Administered 2016-01-24 (×2): 50 ug via INTRAVENOUS

## 2016-01-24 MED ORDER — MELOXICAM 7.5 MG PO TABS
15.0000 mg | ORAL_TABLET | Freq: Every day | ORAL | Status: DC
  Administered 2016-01-24 – 2016-01-26 (×3): 15 mg via ORAL
  Filled 2016-01-24 (×4): qty 2

## 2016-01-24 MED ORDER — SODIUM CHLORIDE 0.9 % IV SOLN
INTRAVENOUS | Status: DC
  Administered 2016-01-24 – 2016-01-26 (×5): via INTRAVENOUS

## 2016-01-24 MED ORDER — HEPARIN SODIUM (PORCINE) 5000 UNIT/ML IJ SOLN
INTRAMUSCULAR | Status: AC
  Filled 2016-01-24: qty 1

## 2016-01-24 MED ORDER — SUGAMMADEX SODIUM 500 MG/5ML IV SOLN
INTRAVENOUS | Status: DC | PRN
  Administered 2016-01-24: 200 mg via INTRAVENOUS

## 2016-01-24 MED ORDER — CHLORHEXIDINE GLUCONATE 4 % EX LIQD: 1.0000 "application " | Freq: Once | CUTANEOUS | Status: DC

## 2016-01-24 SURGICAL SUPPLY — 49 items
ADHESIVE MASTISOL STRL (MISCELLANEOUS) ×3 IMPLANT
CANISTER SUCT 1200ML W/VALVE (MISCELLANEOUS) ×3 IMPLANT
CATH TRAY 16F METER LATEX (MISCELLANEOUS) ×3 IMPLANT
CHLORAPREP W/TINT 26ML (MISCELLANEOUS) ×3 IMPLANT
CLOSURE WOUND 1/2 X4 (GAUZE/BANDAGES/DRESSINGS)
COVER CLAMP SIL LG PBX B (MISCELLANEOUS) IMPLANT
DRAPE LAPAROTOMY 100X77 ABD (DRAPES) ×3 IMPLANT
DRAPE LEGGINS SURG 28X43 STRL (DRAPES) IMPLANT
DRSG OPSITE POSTOP 4X10 (GAUZE/BANDAGES/DRESSINGS) ×6 IMPLANT
DRSG OPSITE POSTOP 4X8 (GAUZE/BANDAGES/DRESSINGS) ×3 IMPLANT
DRSG TELFA 3X8 NADH (GAUZE/BANDAGES/DRESSINGS) ×3 IMPLANT
ELECT CAUTERY BLADE 6.4 (BLADE) ×3 IMPLANT
ELECT REM PT RETURN 9FT ADLT (ELECTROSURGICAL) ×3
ELECTRODE REM PT RTRN 9FT ADLT (ELECTROSURGICAL) ×1 IMPLANT
GAUZE SPONGE 4X4 12PLY STRL (GAUZE/BANDAGES/DRESSINGS) IMPLANT
GLOVE BIO SURGEON STRL SZ8 (GLOVE) ×12 IMPLANT
GLOVE INDICATOR 8.0 STRL GRN (GLOVE) ×12 IMPLANT
GOWN STRL REUS W/ TWL LRG LVL3 (GOWN DISPOSABLE) ×4 IMPLANT
GOWN STRL REUS W/ TWL XL LVL3 (GOWN DISPOSABLE) ×2 IMPLANT
GOWN STRL REUS W/TWL LRG LVL3 (GOWN DISPOSABLE) ×8
GOWN STRL REUS W/TWL XL LVL3 (GOWN DISPOSABLE) ×4
KIT CATH CVC 3 LUMEN 7FR 8IN (MISCELLANEOUS) ×3 IMPLANT
KIT RM TURNOVER STRD PROC AR (KITS) ×3 IMPLANT
LABEL OR SOLS (LABEL) IMPLANT
NDL SAFETY 22GX1.5 (NEEDLE) ×3 IMPLANT
NS IRRIG 1000ML POUR BTL (IV SOLUTION) ×6 IMPLANT
PACK BASIN MAJOR ARMC (MISCELLANEOUS) ×3 IMPLANT
PACK COLON CLEAN CLOSURE (MISCELLANEOUS) ×3 IMPLANT
RELOAD PROXIMATE 30MM BLUE (ENDOMECHANICALS) ×12 IMPLANT
RELOAD STAPLER LINEAR PROX 30 (STAPLE) ×1 IMPLANT
SEPRAFILM MEMBRANE 5X6 (MISCELLANEOUS) ×3 IMPLANT
SET YANKAUER POOLE SUCT (MISCELLANEOUS) IMPLANT
SPONGE LAP 18X18 5 PK (GAUZE/BANDAGES/DRESSINGS) ×3 IMPLANT
STAPLER GUN LINEAR PROX 60 (STAPLE) ×3 IMPLANT
STAPLER PROXIMATE 55 BLUE (STAPLE) ×3 IMPLANT
STAPLER PROXIMATE 75MM BLUE (STAPLE) IMPLANT
STAPLER RELOAD LINEAR PROX 30 (STAPLE) ×3
STAPLER SKIN PROX 35W (STAPLE) ×3 IMPLANT
STRIP CLOSURE SKIN 1/2X4 (GAUZE/BANDAGES/DRESSINGS) IMPLANT
SUT MNCRL 3-0 UNDYED SH (SUTURE) ×2 IMPLANT
SUT MONOCRYL 3-0 UNDYED (SUTURE) ×4
SUT PDS AB 1 CT1 27 (SUTURE) ×3 IMPLANT
SUT PDS AB 1 TP1 54 (SUTURE) ×6 IMPLANT
SUT SILK 0 (SUTURE) ×2
SUT SILK 0 30XBRD TIE 6 (SUTURE) ×1 IMPLANT
SUT SILK 3-0 (SUTURE) ×15 IMPLANT
SUT VIC AB 1 CTX 27 (SUTURE) ×9 IMPLANT
SUT VICRYL PLUS ABS 0 54 (SUTURE) ×6 IMPLANT
SYRINGE 10CC LL (SYRINGE) ×3 IMPLANT

## 2016-01-24 NOTE — Transfer of Care (Signed)
Immediate Anesthesia Transfer of Care Note  Patient: Elizabeth [name]  Procedure(s) Performed: Procedure(s): SIGMOID COLECTOMY (N/A)  Patient Location: PACU  Anesthesia Type:General  Level of Consciousness: sedated  Airway & Oxygen Therapy: Patient Spontanous Breathing and Patient connected to face mask oxygen  Post-op Assessment: Report given to RN and Post -op Vital signs reviewed and stable  Post vital signs: Reviewed and stable  Last Vitals: 0930 100% sat 98.8 temp 77hr 122/61 20resp  Filed Vitals:   01/24/16 0611  BP: 130/85  Pulse: 80  Temp: 35.9 C  Resp: 16    Complications: No apparent anesthesia complications

## 2016-01-24 NOTE — Anesthesia Procedure Notes (Signed)
Procedure Name: Intubation Date/Time: 01/24/2016 7:31 AM Performed by: Delaney Meigs Pre-anesthesia Checklist: Patient identified, Emergency Drugs available, Suction available, Patient being monitored and Timeout performed Patient Re-evaluated:Patient Re-evaluated prior to inductionOxygen Delivery Method: Circle system utilized Preoxygenation: Pre-oxygenation with 100% oxygen Intubation Type: IV induction Ventilation: Mask ventilation without difficulty Laryngoscope Size: Mac and 3 Grade View: Grade I Tube type: Oral Tube size: 7.0 mm Number of attempts: 1 Airway Equipment and Method: Stylet Placement Confirmation: ETT inserted through vocal cords under direct vision,  positive ETCO2 and breath sounds checked- equal and bilateral Secured at: 21 cm Tube secured with: Tape Dental Injury: Teeth and Oropharynx as per pre-operative assessment

## 2016-01-24 NOTE — Progress Notes (Signed)
Preoperative Review   Patient is met in the preoperative holding area. The history is reviewed in the chart and with the patient. I personally reviewed the options and rationale as well as the risks of this procedure that have been previously discussed with the patient. All questions asked by the patient and/or family were answered to their satisfaction.  Patient agrees to proceed with this procedure at this time.  Caterin Tabares E Jariya Reichow M.D. FACS  

## 2016-01-24 NOTE — Anesthesia Preprocedure Evaluation (Signed)
Anesthesia Evaluation  Patient identified by MRN, date of birth, ID band Patient awake    Reviewed: Allergy & Precautions, H&P , NPO status , Patient's Chart, lab work & pertinent test results, reviewed documented beta blocker date and time   History of Anesthesia Complications Negative for: history of anesthetic complications  Airway Mallampati: II  TM Distance: >3 FB Neck ROM: full    Dental no notable dental hx. (+) Chipped, Missing, Poor Dentition   Pulmonary neg shortness of breath, neg sleep apnea, neg COPD, neg recent URI, former smoker,    Pulmonary exam normal breath sounds clear to auscultation       Cardiovascular Exercise Tolerance: Good hypertension, (-) angina(-) CAD, (-) Past MI, (-) Cardiac Stents and (-) CABG Normal cardiovascular exam(-) dysrhythmias + Valvular Problems/Murmurs  Rhythm:regular Rate:Normal     Neuro/Psych PSYCHIATRIC DISORDERS (Bipolar) negative neurological ROS     GI/Hepatic (+) Hepatitis - (resolved), B  Endo/Other  diabetes  Renal/GU negative Renal ROS  negative genitourinary   Musculoskeletal   Abdominal   Peds  Hematology negative hematology ROS (+)   Anesthesia Other Findings Past Medical History:   Irritable bowel syndrome                                     RA (rheumatoid arthritis) (HCC)                              Bipolar 1 disorder (HCC)                                     Insomnia                                                     GERD (gastroesophageal reflux disease)                       Diverticulosis                                               Motion sickness                                                Comment:boats   Hepatitis                                                      Comment:B - "not active"   Heart murmur                                                   Comment:followed  by PCP   Diabetes mellitus without complication ([place])                 Depression                                                   Reproductive/Obstetrics negative OB ROS                             Anesthesia Physical Anesthesia Plan  ASA: III  Anesthesia Plan: General   Post-op Pain Management:    Induction:   Airway Management Planned:   Additional Equipment:   Intra-op Plan:   Post-operative Plan:   Informed Consent: I have reviewed the patients History and Physical, chart, labs and discussed the procedure including the risks, benefits and alternatives for the proposed anesthesia with the patient or authorized representative who has indicated his/her understanding and acceptance.   Dental Advisory Given  Plan Discussed with: Anesthesiologist, CRNA and Surgeon  Anesthesia Plan Comments:         Anesthesia Quick Evaluation

## 2016-01-24 NOTE — Anesthesia Postprocedure Evaluation (Signed)
Anesthesia Post Note  Patient: Elizabeth [name]  Procedure(s) Performed: Procedure(s) (LRB): SIGMOID COLECTOMY (N/A)  Patient location during evaluation: PACU Anesthesia Type: General Level of consciousness: awake and alert Pain management: pain level controlled Vital Signs Assessment: post-procedure vital signs reviewed and stable Respiratory status: spontaneous breathing, nonlabored ventilation, respiratory function stable and patient connected to nasal cannula oxygen Cardiovascular status: blood pressure returned to baseline and stable Postop Assessment: no signs of nausea or vomiting Anesthetic complications: no    Last Vitals:  Filed Vitals:   01/24/16 1300 01/24/16 1357  BP: 100/62   Pulse: 63   Temp:    Resp: 17 14    Last Pain:  Filed Vitals:   01/24/16 1815  PainSc: 8                  Martha Clan

## 2016-01-24 NOTE — Progress Notes (Signed)
Dr Rosey Bath called sbp 90 to 94  No new orders at present

## 2016-01-24 NOTE — Op Note (Signed)
Pre-operative Diagnosis: History of acute diverticulitis  Post-operative Diagnosis: Same  Surgeon: Elizabeth [name]   Assistants: Elizabeth [name]  Anesthesia: Gen. with endotracheal tube  Procedure: Sigmoid colon resection with extension to the distal descending colon and incidental appendectomy  Surgeon: Elizabeth [name]. Elizabeth Knack, MD FACS  Anesthesia: Gen. with endotracheal tube  This a patient of Elizabeth [name] with a history of recurrent acute diverticulitis she has not had an attack and several months but wishes to have surgical intervention. Preoperatively discussed rationale for surgery the options of observation risk bleeding infection recurrence of disease anastomotic leak necessitating colostomy is all reviewed for she and her family in the preop holding area the understood and agreed to proceed   procedure Details  The patient was seen again in the Holding Room. The benefits, complications, treatment options, and expected outcomes were discussed with the patient. The risks of bleeding, infection, recurrence of symptoms, failure to resolve symptoms,  bowel injury, any of which could require further surgery were reviewed with the patient.   The patient was taken to Operating Room, identified as Elizabeth [name] and the procedure verified.  A Time Out was held and the above information confirmed.  Prior to the induction of general anesthesia, antibiotic prophylaxis was administered. VTE prophylaxis was in place. General endotracheal anesthesia was then administered and tolerated well. After the induction, the abdomen was prepped with Chloraprep and draped in the sterile fashion. The patient was positioned in the supine position.  A midline incision was utilized to open and explore the abdominal cavity no other lesions were identified the colon was redundant in the sigmoid with no sign of active disease. The appendix was identified and not inflamed. Appendectomy was performed as follows. The appendix and  mesoappendix were divided between clamps and ligated with 0 Vicryl ligature. Tension was turned to the redundant sigmoid colon and the avascular line laterally was taken down the collection cautery was utilized to perform this dissection. The ureter was identified and kept in view. At the peritoneal reflection of the rectosigmoid junction of the colon was divided between clamps and the mesentery was then divided between clamps and ligated with 0 silk moving proximal to the distal descending colon where no further diverticula nor active disease was identified. This site was chosen for division. It was divided between bowel clamps. And the specimen was ultimately sent off for examination. Hemostasis was adequate at this point.  A triangulated TA stapled anastomosis was performed without tension however after closing the second layer of staples testing of the lumens showed that the proximal lumen was partially occluded. Therefore the strangulated staple anastomosis was taken down sharply. Good blood supply was noted at the mucosal edges. And because of the redundancy it was decided to perform a GIA and TA stapled anastomosis. This was done as follows. The GIA stapler was placed into each limb of the anastomosis and fired which gave a generous anastomosis. The colon was then closed with a TA stapler without difficulty. The sides of the colon were reinforced with 3-0 silk figure-of-eight late sutures and the anastomosis is patent.  Once assuring that hemostasis was adequate and the sponge lap needle count was correct gown and gloves and instruments were changed. A piece of Seprafilm was placed over the omentum and the wound was closed with running #1 PDS Marcaine for a total of 30 cc was placed in the subcutaneous tissues tissues and then skin staples were placed.  She tolerated the procedure well there were no  Occasions she was taken to recovery room in stable condition to be admitted for continued care once lap  needle count was finally correct   Findings: Diverticulosis without signs of abscess or active inflammation. Sigmoid colectomy with extension up onto the distal descending colon was performed   Estimated Blood Loss: 100 cc         Drains: None         Specimens: Sigmoid colon       Complications: None                  Condition: Able  Elizabeth [name] E. Elizabeth Knack, MD, FACS

## 2016-01-25 ENCOUNTER — Encounter: Payer: Self-pay | Admitting: Surgery

## 2016-01-25 LAB — BASIC METABOLIC PANEL
ANION GAP: 5 (ref 5–15)
BUN: 10 mg/dL (ref 6–20)
CO2: 26 mmol/L (ref 22–32)
Calcium: 8.8 mg/dL — ABNORMAL LOW (ref 8.9–10.3)
Chloride: 109 mmol/L (ref 101–111)
Creatinine, Ser: 0.71 mg/dL (ref 0.44–1.00)
GFR calc Af Amer: >60 mL/min (ref >60)
GFR calc non Af Amer: >60 mL/min (ref >60)
Glucose, Bld: 156 mg/dL — ABNORMAL HIGH (ref 65–99)
POTASSIUM: 3.9 mmol/L (ref 3.5–5.1)
SODIUM: 140 mmol/L (ref 135–145)

## 2016-01-25 LAB — CBC
HCT: 36.7 % (ref 35.0–47.0)
Hemoglobin: 12.5 g/dL (ref 12.0–16.0)
MCH: 30.6 pg (ref 26.0–34.0)
MCHC: 33.9 g/dL (ref 32.0–36.0)
MCV: 90.2 fL (ref 80.0–100.0)
PLATELETS: 221 10*3/uL (ref 150–440)
RBC: 4.07 MIL/uL (ref 3.80–5.20)
RDW: 14.9 % — AB (ref 11.5–14.5)
WBC: 8.8 10*3/uL (ref 3.6–11.0)

## 2016-01-25 LAB — SURGICAL PATHOLOGY

## 2016-01-25 MED ORDER — MORPHINE SULFATE (PF) 2 MG/ML IV SOLN: 2.0000 mg | INTRAVENOUS | Status: DC | PRN

## 2016-01-25 MED ORDER — ONDANSETRON HCL 4 MG/2ML IJ SOLN: 4.0000 mg | Freq: Four times a day (QID) | INTRAMUSCULAR | Status: DC

## 2016-01-25 MED ORDER — ONDANSETRON HCL 4 MG/2ML IJ SOLN
INTRAMUSCULAR | Status: AC
  Filled 2016-01-25: qty 2

## 2016-01-25 MED ORDER — ONDANSETRON HCL 4 MG/2ML IJ SOLN
4.0000 mg | Freq: Four times a day (QID) | INTRAMUSCULAR | Status: DC | PRN
  Administered 2016-01-25: 4 mg via INTRAVENOUS
  Filled 2016-01-25: qty 2

## 2016-01-25 NOTE — Progress Notes (Signed)
1 Day Post-Op  Subjective: Patient's pain is much better controlled now. He has no nausea or vomiting no fevers or chills and wants her catheter removed  Objective: Vital signs in last 24 hours: Temp:  [97.1 F (36.2 C)-98.6 F (37 C)] 98.6 F (37 C) (02/16 0422) Pulse Rate:  [57-83] 79 (02/16 0422) Resp:  [11-21] 20 (02/16 0754) BP: (70-124)/(30-69) 124/69 mmHg (02/16 0422) SpO2:  [97 %-100 %] 97 % (02/16 0754) FiO2 (%):  [97 %] 97 % (02/16 0754) Weight:  [210 lb 4.8 oz (95.391 kg)] 210 lb 4.8 oz (95.391 kg) (02/15 1815) Last BM Date: 01/22/16  Intake/Output from previous day: 02/15 0701 - 02/16 0700 In: 2857 [P.O.:300; I.V.:2557] Out: 775 [Urine:675; Blood:100] Intake/Output this shift: Total I/O In: 256 [I.V.:256] Out: -   Physical exam:  Wounds are dressed abdomen is soft and nontender calves are nontender leak catheters in place  Lab Results: CBC   Recent Labs  01/25/16 0354  WBC 8.8  HGB 12.5  HCT 36.7  PLT 221   BMET  Recent Labs  01/25/16 0354  NA 140  K 3.9  CL 109  CO2 26  GLUCOSE 156*  BUN 10  CREATININE 0.71  CALCIUM 8.8*   PT/INR No results for input(s): LABPROT, INR in the last 72 hours. ABG No results for input(s): PHART, HCO3 in the last 72 hours.  Invalid input(s): PCO2, PO2  Studies/Results: No results found.  Anti-infectives: Anti-infectives    Start     Dose/Rate Route Frequency Ordered Stop   01/24/16 0950  ceFAZolin (ANCEF) 2-3 GM-% IVPB SOLR    Comments:  Rexanne Mano: cabinet override      01/24/16 0950 01/24/16 1143   01/24/16 0216  ertapenem (INVANZ) 1 g in sodium chloride 0.9 % 50 mL IVPB     1 g 100 mL/hr over 30 Minutes Intravenous On call to O.R. 01/24/16 0216 01/24/16 0745      Assessment/Plan: s/p Procedure(s): SIGMOID COLECTOMY   Continue clear liquids plan removal of Foley catheter today Stable postop day [place], MD, FACS  01/25/2016

## 2016-01-26 LAB — BASIC METABOLIC PANEL
Anion gap: 7 (ref 5–15)
BUN: 6 mg/dL (ref 6–20)
CALCIUM: 8.6 mg/dL — AB (ref 8.9–10.3)
CO2: 27 mmol/L (ref 22–32)
CREATININE: 0.65 mg/dL (ref 0.44–1.00)
Chloride: 107 mmol/L (ref 101–111)
GFR calc Af Amer: >60 mL/min (ref >60)
GFR calc non Af Amer: >60 mL/min (ref >60)
GLUCOSE: 123 mg/dL — AB (ref 65–99)
Potassium: 4 mmol/L (ref 3.5–5.1)
SODIUM: 141 mmol/L (ref 135–145)

## 2016-01-26 LAB — CBC WITH DIFFERENTIAL/PLATELET
Basophils Absolute: 0 10*3/uL (ref 0–0.1)
Basophils Relative: 0 %
EOS ABS: 0.1 10*3/uL (ref 0–0.7)
EOS PCT: 1 %
HCT: 34.5 % — ABNORMAL LOW (ref 35.0–47.0)
Hemoglobin: 11.5 g/dL — ABNORMAL LOW (ref 12.0–16.0)
LYMPHS ABS: 1.1 10*3/uL (ref 1.0–3.6)
LYMPHS PCT: 17 %
MCH: 30.6 pg (ref 26.0–34.0)
MCHC: 33.5 g/dL (ref 32.0–36.0)
MCV: 91.3 fL (ref 80.0–100.0)
MONO ABS: 0.4 10*3/uL (ref 0.2–0.9)
Monocytes Relative: 6 %
Neutro Abs: 4.9 10*3/uL (ref 1.4–6.5)
Neutrophils Relative %: 76 %
PLATELETS: 193 10*3/uL (ref 150–440)
RBC: 3.77 MIL/uL — AB (ref 3.80–5.20)
RDW: 14.5 % (ref 11.5–14.5)
WBC: 6.6 10*3/uL (ref 3.6–11.0)

## 2016-01-26 NOTE — Care Management Important Message (Signed)
Important Message  Patient Details  Name: Elizabeth [name] MRN: [id number] Date of Birth: 1966-05-09   Medicare Important Message Given:  Yes    Juliann Pulse A Vann Okerlund 01/26/2016, 10:25 AM

## 2016-01-26 NOTE — Progress Notes (Signed)
2 Days Post-Op  Subjective: As well and is passing gas wants to advance diet  Objective: Vital signs in last 24 hours: Temp:  [97.6 F (36.4 C)-98.9 F (37.2 C)] 97.6 F (36.4 C) (02/17 0412) Pulse Rate:  [77-96] 87 (02/17 0412) Resp:  [18-20] 20 (02/17 0412) BP: (125-143)/(71-87) 143/87 mmHg (02/17 0412) SpO2:  [95 %-99 %] 95 % (02/17 0412) FiO2 (%):  [98 %] 98 % (02/16 1200) Last BM Date: 01/22/16  Intake/Output from previous day: 02/16 0701 - 02/17 0700 In: 2040.9 [P.O.:590; I.V.:1450.9] Out: 950 [Urine:950] Intake/Output this shift: Total I/O In: 425 [P.O.:210; I.V.:215] Out: -   Physical exam:  Abdomen is soft nontender wound is clean no erythema no drainage An tender calves  Lab Results: CBC   Recent Labs  01/25/16 0354 01/26/16 0331  WBC 8.8 6.6  HGB 12.5 11.5*  HCT 36.7 34.5*  PLT 221 193   BMET  Recent Labs  01/25/16 0354 01/26/16 0331  NA 140 141  K 3.9 4.0  CL 109 107  CO2 26 27  GLUCOSE 156* 123*  BUN 10 6  CREATININE 0.71 0.65  CALCIUM 8.8* 8.6*   PT/INR No results for input(s): LABPROT, INR in the last 72 hours. ABG No results for input(s): PHART, HCO3 in the last 72 hours.  Invalid input(s): PCO2, PO2  Studies/Results: No results found.  Anti-infectives: Anti-infectives    Start     Dose/Rate Route Frequency Ordered Stop   01/24/16 0950  ceFAZolin (ANCEF) 2-3 GM-% IVPB SOLR    Comments:  Rexanne Mano: cabinet override      01/24/16 0950 01/24/16 1143   01/24/16 0216  ertapenem (INVANZ) 1 g in sodium chloride 0.9 % 50 mL IVPB     1 g 100 mL/hr over 30 Minutes Intravenous On call to O.R. 01/24/16 0216 01/24/16 0745      Assessment/Plan: s/p Procedure(s): SIGMOID COLECTOMY   Will advance to full liquid diet today and saline lock.  Florene Glen, MD, FACS  01/26/2016

## 2016-01-27 MED ORDER — HYDROCODONE-ACETAMINOPHEN 5-300 MG PO TABS: 1.0000 | ORAL_TABLET | ORAL | Status: DC | PRN

## 2016-01-27 NOTE — Discharge Summary (Signed)
Physician Discharge Summary  Patient ID: Elizabeth [name] MRN: [id number] DOB/AGE: [birth year] 50 y.o.  Admit date: 01/24/2016 Discharge date: 01/27/2016   Discharge Diagnoses:  Active Problems:   Diverticula of colon   Procedures: Sigmoid colectomy  Hospital Course: This patient with a long history of acute diverticulitis requiring hospitalization she had not had an attack and several months and was cleared for surgery she went a sigmoid colectomy with extension up to the distal descending colon. She has made an uncomplicated postoperative recovery is passing gas having bowel movements and tolerating a regular diet. She will be discharged in stable condition to follow-up in our office in 10 days she can shower and staples will be removed in the office.  Consults: None  Disposition: 01-Home or Self Care     Medication List    STOP taking these medications        polyethylene glycol powder powder  Commonly known as:  GLYCOLAX/MIRALAX      TAKE these medications        accu-chek soft touch lancets  1 each by Other route 3 (three) times daily.     aspirin EC 81 MG tablet  Take 81 mg by mouth daily.     B-D INS SYR ULTRAFINE 1CC/31G 31G X 5/16" 1 ML Misc  Generic drug:  Insulin Syringe-Needle U-100  use to inject METHOTREXATE     bisacodyl 5 MG EC tablet  Generic drug:  bisacodyl  Take 4 tablets at 8AM the day of your surgery.     calcium-vitamin D 500-200 MG-UNIT tablet  Take 2 tablets by mouth daily.     enalapril 5 MG tablet  Commonly known as:  VASOTEC  Take 1 tablet (5 mg total) by mouth daily.     folic acid 1 MG tablet  Commonly known as:  FOLVITE  Take 1 mg by mouth daily.     glucose blood test strip  Commonly known as:  ACCU-CHEK SMARTVIEW  Use as instructed     Hydrocodone-Acetaminophen 5-300 MG Tabs  Commonly known as:  VICODIN  Take 1 tablet by mouth every 4 (four) hours as needed.     lansoprazole 30 MG capsule  Commonly known as:  PREVACID  take  1 capsule by mouth once daily 30 MINUTES PRIOR TO BREAKFAST     linagliptin 5 MG Tabs tablet  Commonly known as:  TRADJENTA  Take 1 tablet (5 mg total) by mouth daily.     LINZESS 290 MCG Caps capsule  Generic drug:  Linaclotide  Take 290 mcg by mouth every morning.     meloxicam 15 MG tablet  Commonly known as:  MOBIC  Take 15 mg by mouth daily.     metFORMIN 1000 MG tablet  Commonly known as:  GLUCOPHAGE  Take 1 tablet (1,000 mg total) by mouth 2 (two) times daily with a meal.     methotrexate 50 MG/2ML injection  inject 0.6 milliliters subcutaneously every week     OLANZapine 15 MG tablet  Commonly known as:  ZYPREXA  Take 15 mg by mouth at bedtime.     SIMILASAN DRY EYE RELIEF Soln  Apply 2 drops to eye as needed (to each eye).     traZODone 100 MG tablet  Commonly known as:  DESYREL  Take 100 mg by mouth at bedtime as needed.         Florene Glen, MD, FACS

## 2016-01-27 NOTE — Discharge Instructions (Signed)
Resume normal activities A shower Remove dressing this afternoon and leave staples intact. Regular diet Resume all home medications and take oral analgesics as needed Follow-up with Dr. Burt Knack in 10 days

## 2016-01-27 NOTE — Progress Notes (Signed)
3 Days Post-Op  Subjective: This patient status post sigmoid colectomy area did this was done for a history of diverticulitis. Pathology confirmed diverticulosis without active inflammation. She is feeling well today and wants to go home she is tolerating a regular diet.  Objective: Vital signs in last 24 hours: Temp:  [98.2 F (36.8 C)-99.5 F (37.5 C)] 98.4 F (36.9 C) (02/18 0519) Pulse Rate:  [81-100] 81 (02/18 0519) Resp:  [16-19] 19 (02/18 0519) BP: (141-150)/(82-84) 148/84 mmHg (02/18 0519) SpO2:  [96 %-99 %] 99 % (02/18 0519) Last BM Date: 01/26/16  Intake/Output from previous day: 02/17 0701 - 02/18 0700 In: 2862.4 [P.O.:2170; I.V.:692.4] Out: 500 [Urine:500] Intake/Output this shift:    Physical exam:  Abdomen is soft and nontender wound is clean no erythema no drainage calves are nontender  Lab Results: CBC   Recent Labs  01/25/16 0354 01/26/16 0331  WBC 8.8 6.6  HGB 12.5 11.5*  HCT 36.7 34.5*  PLT 221 193   BMET  Recent Labs  01/25/16 0354 01/26/16 0331  NA 140 141  K 3.9 4.0  CL 109 107  CO2 26 27  GLUCOSE 156* 123*  BUN 10 6  CREATININE 0.71 0.65  CALCIUM 8.8* 8.6*   PT/INR No results for input(s): LABPROT, INR in the last 72 hours. ABG No results for input(s): PHART, HCO3 in the last 72 hours.  Invalid input(s): PCO2, PO2  Studies/Results: No results found.  Anti-infectives: Anti-infectives    Start     Dose/Rate Route Frequency Ordered Stop   01/24/16 0950  ceFAZolin (ANCEF) 2-3 GM-% IVPB SOLR    Comments:  Rexanne Mano: cabinet override      01/24/16 0950 01/24/16 1143   01/24/16 0216  ertapenem (INVANZ) 1 g in sodium chloride 0.9 % 50 mL IVPB     1 g 100 mL/hr over 30 Minutes Intravenous On call to O.R. 01/24/16 0216 01/24/16 0745      Assessment/Plan: s/p Procedure(s): SIGMOID COLECTOMY   Patient doing very well having bowel movements passing gas and tolerating a regular diet. We'll discharge later today after  lunch. She will follow-up in my office in 7-10 days  Florene Glen, MD, FACS  01/27/2016

## 2016-01-27 NOTE — Progress Notes (Signed)
01/27/2016   11:00  [name] to be D/C'd Home per MD order.  Discussed prescriptions and follow up appointments with the patient. Prescriptions given to patient, medication list explained in detail. Pt verbalized understanding.    Medication List    STOP taking these medications        polyethylene glycol powder powder  Commonly known as:  GLYCOLAX/MIRALAX      TAKE these medications        accu-chek soft touch lancets  1 each by Other route 3 (three) times daily.     aspirin EC 81 MG tablet  Take 81 mg by mouth daily.     B-D INS SYR ULTRAFINE 1CC/31G 31G X 5/16" 1 ML Misc  Generic drug:  Insulin Syringe-Needle U-100  use to inject METHOTREXATE     bisacodyl 5 MG EC tablet  Generic drug:  bisacodyl  Take 4 tablets at 8AM the day of your surgery.     calcium-vitamin D 500-200 MG-UNIT tablet  Take 2 tablets by mouth daily.     enalapril 5 MG tablet  Commonly known as:  VASOTEC  Take 1 tablet (5 mg total) by mouth daily.     folic acid 1 MG tablet  Commonly known as:  FOLVITE  Take 1 mg by mouth daily.     glucose blood test strip  Commonly known as:  ACCU-CHEK SMARTVIEW  Use as instructed     Hydrocodone-Acetaminophen 5-300 MG Tabs  Commonly known as:  VICODIN  Take 1 tablet by mouth every 4 (four) hours as needed.     lansoprazole 30 MG capsule  Commonly known as:  PREVACID  take 1 capsule by mouth once daily 30 MINUTES PRIOR TO BREAKFAST     linagliptin 5 MG Tabs tablet  Commonly known as:  TRADJENTA  Take 1 tablet (5 mg total) by mouth daily.     LINZESS 290 MCG Caps capsule  Generic drug:  Linaclotide  Take 290 mcg by mouth every morning.     meloxicam 15 MG tablet  Commonly known as:  MOBIC  Take 15 mg by mouth daily.     metFORMIN 1000 MG tablet  Commonly known as:  GLUCOPHAGE  Take 1 tablet (1,000 mg total) by mouth 2 (two) times daily with a meal.     methotrexate 50 MG/2ML injection  inject 0.6 milliliters subcutaneously every week      OLANZapine 15 MG tablet  Commonly known as:  ZYPREXA  Take 15 mg by mouth at bedtime.     SIMILASAN DRY EYE RELIEF Soln  Apply 2 drops to eye as needed (to each eye).     traZODone 100 MG tablet  Commonly known as:  DESYREL  Take 100 mg by mouth at bedtime as needed.        Filed Vitals:   01/26/16 2041 01/27/16 0519  BP: 141/82 148/84  Pulse: 100 81  Temp: 99.5 F (37.5 C) 98.4 F (36.9 C)  Resp:  19    Skin clean, dry and intact without evidence of skin break down, no evidence of skin tears noted. IV catheter discontinued intact. Site without signs and symptoms of complications. Dressing and pressure applied. Pt denies pain at this time. No complaints noted.  An After Visit Summary was printed and given to the patient. Patient escorted via [place], and D/C home via private auto.  Dola Argyle

## 2016-01-30 ENCOUNTER — Other Ambulatory Visit
Admission: RE | Admit: 2016-01-30 | Discharge: 2016-01-30 | Disposition: A | Discharge status: Home / Self Care | Payer: Medicare Other | Source: Ambulatory Visit | Attending: Surgery | Admitting: Surgery

## 2016-01-30 ENCOUNTER — Telehealth: Payer: Self-pay

## 2016-01-30 ENCOUNTER — Telehealth: Payer: Self-pay | Admitting: Surgery

## 2016-01-30 DIAGNOSIS — R309 Painful micturition, unspecified: Secondary | ICD-10-CM | POA: Insufficient documentation

## 2016-01-30 DIAGNOSIS — Z9049 Acquired absence of other specified parts of digestive tract: Secondary | ICD-10-CM

## 2016-01-30 DIAGNOSIS — R300 Dysuria: Secondary | ICD-10-CM

## 2016-01-30 LAB — URINALYSIS COMPLETE WITH MICROSCOPIC (ARMC ONLY)
BACTERIA UA: NONE SEEN
Bilirubin Urine: NEGATIVE
Glucose, UA: NEGATIVE mg/dL
Ketones, ur: NEGATIVE mg/dL
Leukocytes, UA: NEGATIVE
Nitrite: NEGATIVE
PH: 5 (ref 5.0–8.0)
Protein, ur: NEGATIVE mg/dL
RBC / HPF: NONE SEEN RBC/hpf (ref 0–5)
SPECIFIC GRAVITY, URINE: 1.02 (ref 1.005–1.030)

## 2016-01-30 NOTE — Telephone Encounter (Signed)
Patient had Sigmoid Colectomy with Dr Burt Knack on 01/24/16 - she states her incision is dripping blood and looks like it also has pus in the blood. It is not bright red blood. She was unable to look at incision to see if it hs come open or any staples have come out because it is in the fold of her stomach. She states this started yesterday and has gotten worse this morning. It has dripped down onto her shoes and the carpet. Please call and advise.

## 2016-01-30 NOTE — Telephone Encounter (Signed)
Patient walked in at this time. Patient was escorted to Exam Room to look at incision. A staple had popped out about 2/3 of the way down the midline incision and patient had minimal serous drainage from this area. Area was cleaned and dried, benzoin and steri-strip applied to this area. No obvious seroma seen. Area dressed and patient was educated that she needs to shower and wash this area with soap and water daily. Discouraged from using any ointments of lotions.   Patient is to follow-up with Dr. Burt Knack on 3/6. Explained that if she developed any redness, fever >100.5, or nausea/vomiting she needs to call our office immediately.

## 2016-01-30 NOTE — Telephone Encounter (Signed)
Patient came into office this am. Was explaining that she has had dysuria, frequency, and urgency since being discharged from hospital. Sent to [facility] for UA and UCS. Orders placed.  Received negative UA results at this time.  Returned phone call to patient to give results, also let her know that I would be watching for results of UCS in 3 days to determine whether she may need an antibiotic. She verbalizes understanding of this. Pt will call back with any further questions or concerns.

## 2016-01-31 ENCOUNTER — Ambulatory Visit: Payer: Medicare Other | Admitting: Family Medicine

## 2016-02-01 LAB — URINE CULTURE

## 2016-02-02 IMAGING — MG MM DIAG BREAST TOMO BILATERAL
8 of 15 series · 8 of 35 positions shown · non-contrast
Comparison: Previous mammograms, the most recent dated 09/07/2008.

CLINICAL DATA: Asymmetrical retroareolar breast density on the
right with possible nipple retraction on a recent abdomen CT.

EXAM:
DIGITAL DIAGNOSTIC BILATERAL MAMMOGRAM WITH 3D TOMOSYNTHESIS WITH
CAD
ULTRASOUND RIGHT BREAST

[L CC synth-2D]
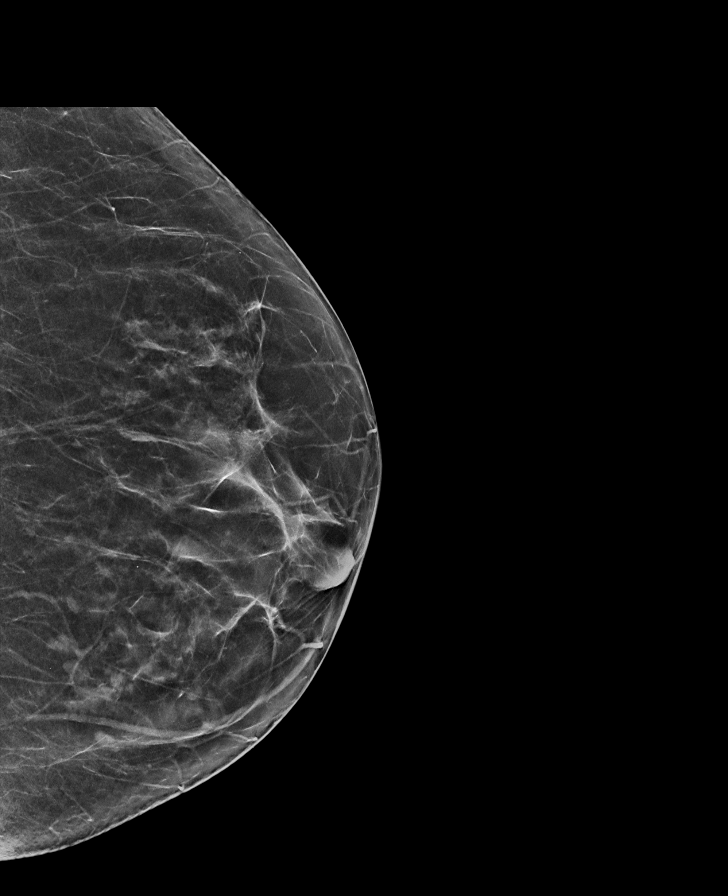

[L MLO]
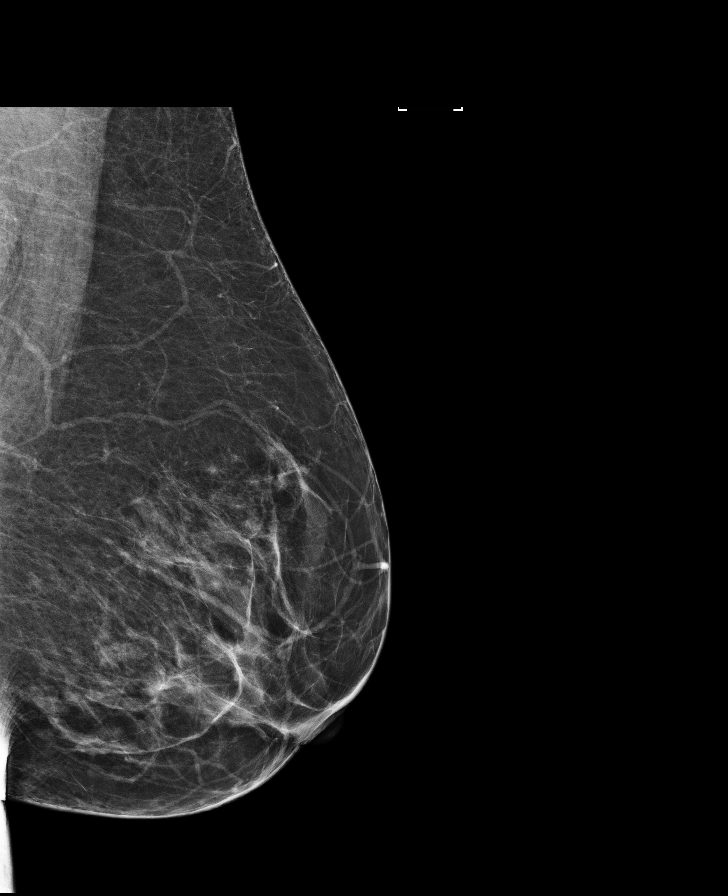

[R CC synth-2D (1 of 2)]
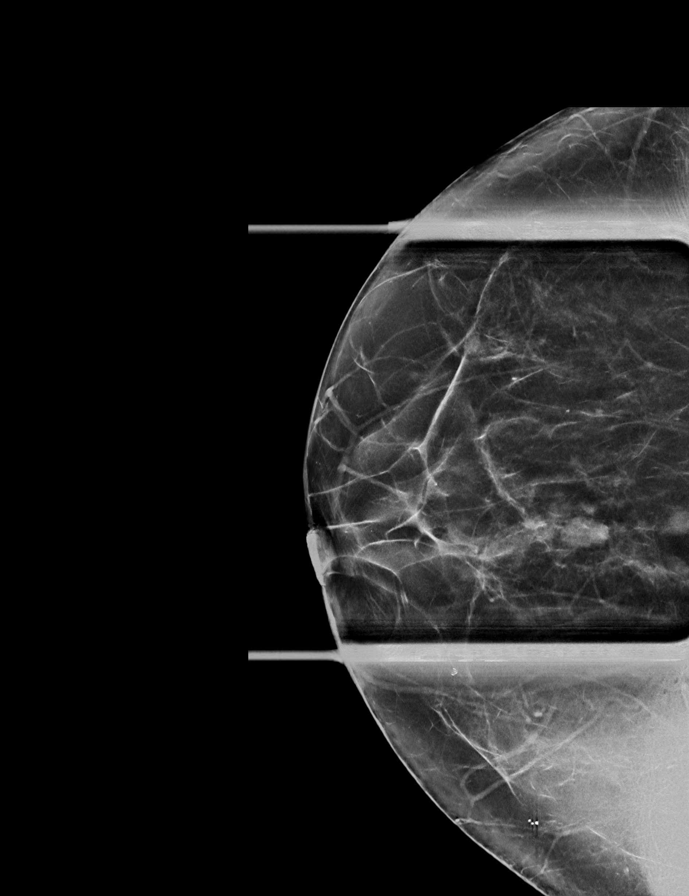

[R CC]
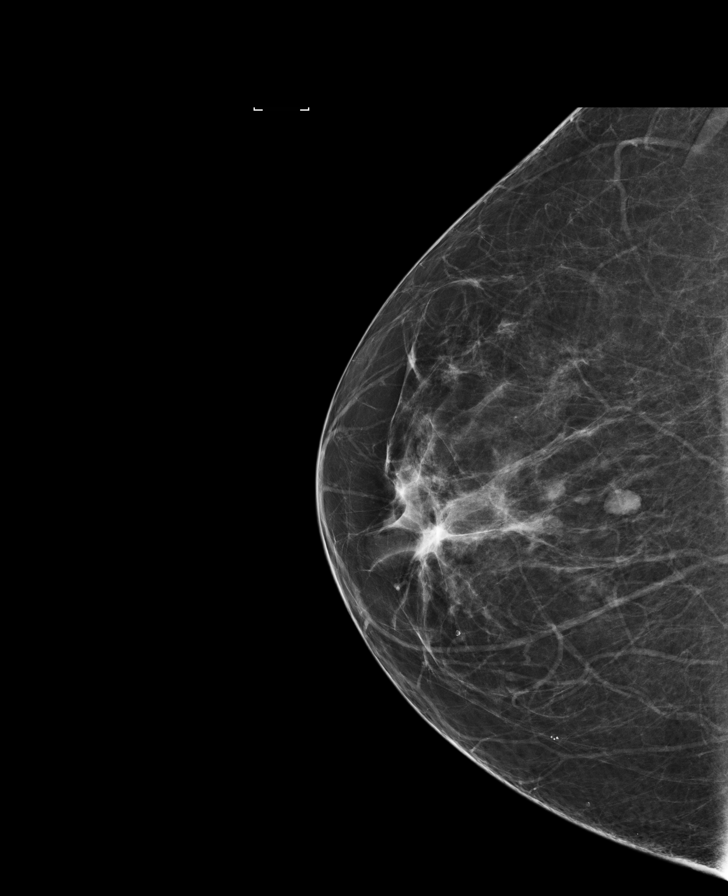

[L CC]
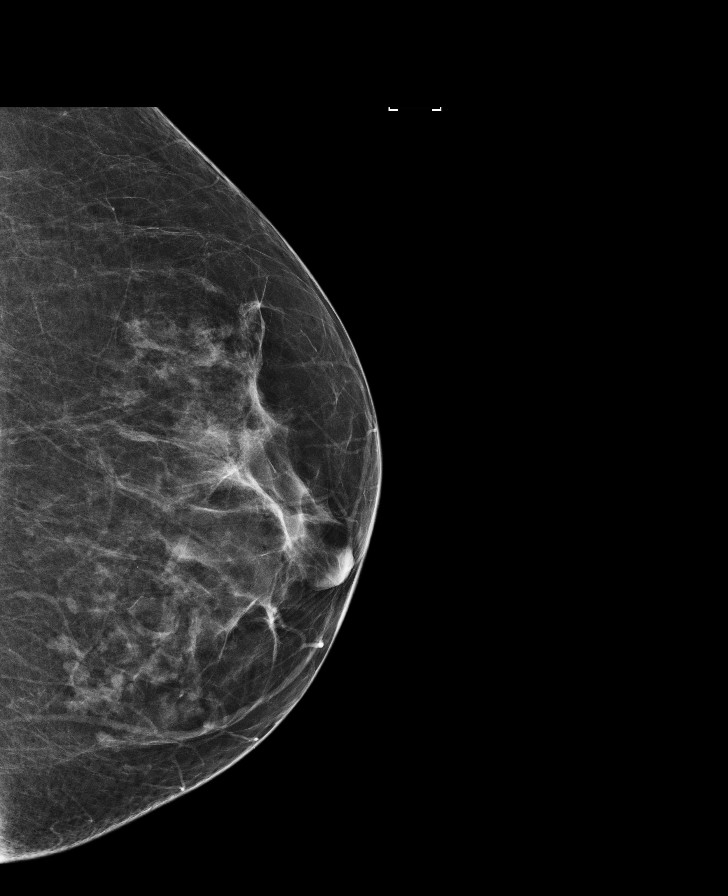

[R MLO]
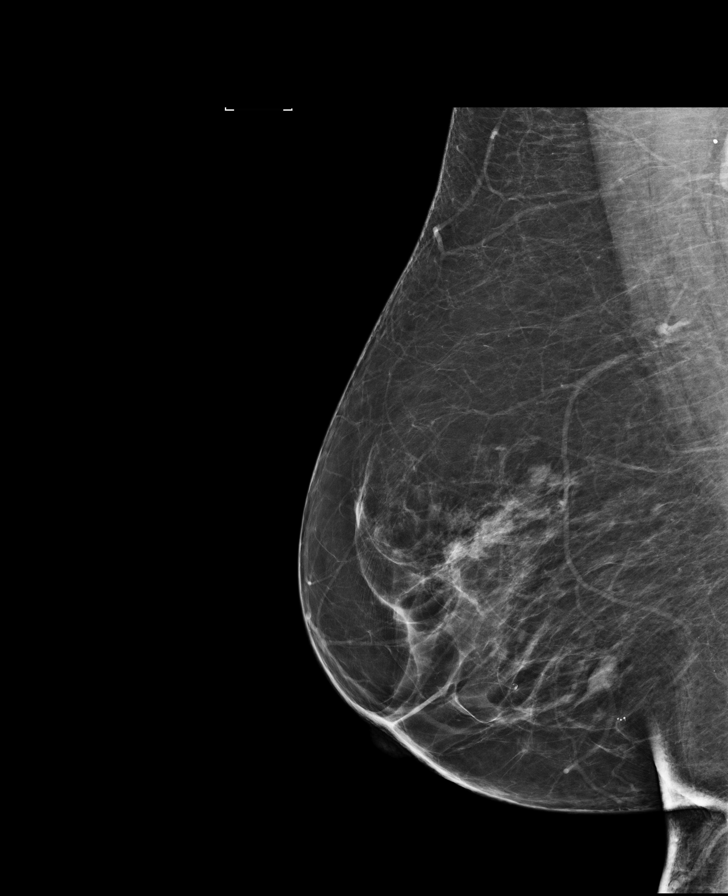

[L MLO synth-2D]
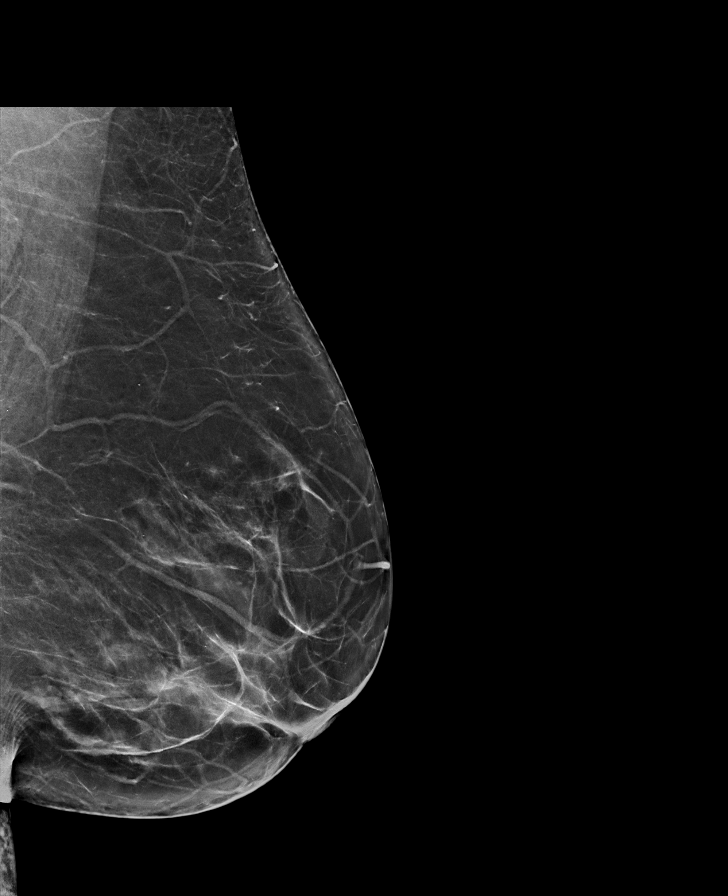

[R CC synth-2D (2 of 2)]
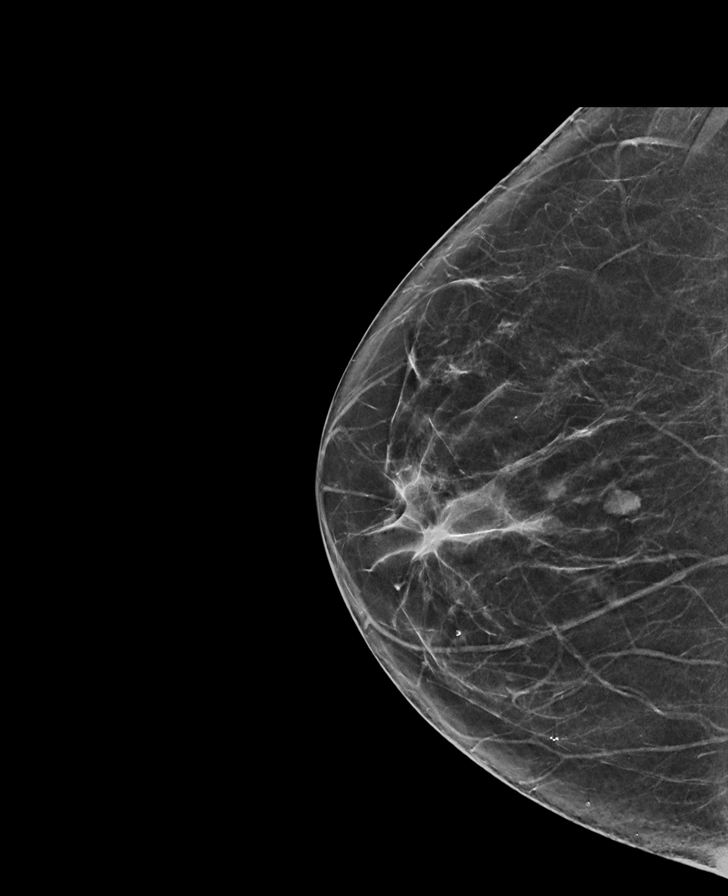

[8 of 35 positions shown; findings below may reference images not displayed]

Abdomen CT dated 11/07/2015.

ACR Breast Density Category c: The breast tissue is heterogeneously
dense, which may obscure small masses.
FINDINGS: Stable heterogeneously dense breast tissue in the central and
retroareolar regions of both breasts with mild retraction of both
nipples. There is no asymmetry in this density. There is an oval,
circumscribed mass in the posterior aspect of the 6 o'clock
retroareolar region of the right breast with 2 adjacent small,
circumscribed nodular densities. A large number of small,
circumscribed nodular densities are again demonstrated in the medial
left breast with mild progression.

Mammographic images were processed with CAD.

On physical exam, no mass is palpable in the 6 o'clock retroareolar
right breast.

Targeted ultrasound is performed, showing an 11 x 7 x 6 mm cluster
of microcysts in the 6 o'clock retroareolar right breast,
corresponding to the mammographic mass. There are multiple
additional smaller cysts in that portion of the breast.
IMPRESSION: 1. Right breast cysts.
2. The asymmetrical retroareolar right breast density seen on the
recent CT is mammographically stable normal fibroglandular tissue.
This appeared asymmetrical on the recent CT due to a lower position
of the right breast relative to the left breast when the patient was
in the CT gantry.
3. No evidence of malignancy.

RECOMMENDATION:
Bilateral screening mammogram in 1 year.

I have discussed the findings and recommendations with the patient.
Results were also provided in writing at the conclusion of the
visit. If applicable, a reminder letter will be sent to the patient
regarding the next appointment.

BI-RADS CATEGORY  2: Benign.

## 2016-02-05 ENCOUNTER — Encounter: Payer: Self-pay | Admitting: Family Medicine

## 2016-02-05 ENCOUNTER — Telehealth: Payer: Self-pay

## 2016-02-05 ENCOUNTER — Ambulatory Visit (INDEPENDENT_AMBULATORY_CARE_PROVIDER_SITE_OTHER): Payer: Medicare Other | Admitting: Family Medicine

## 2016-02-05 VITALS — BP 122/68 | HR 104 | Temp 98.7°F | Resp 20 | Ht 67.0 in | Wt 201.2 lb

## 2016-02-05 DIAGNOSIS — R300 Dysuria: Secondary | ICD-10-CM | POA: Diagnosis not present

## 2016-02-05 LAB — POCT URINALYSIS DIPSTICK
Bilirubin, UA: NEGATIVE
Glucose, UA: NEGATIVE
Leukocytes, UA: NEGATIVE
NITRITE UA: NEGATIVE
PH UA: 6
Protein, UA: NEGATIVE
SPEC GRAV UA: 1.02
Urobilinogen, UA: NEGATIVE

## 2016-02-05 NOTE — Telephone Encounter (Signed)
Received patient's UCS at this time suggesting recollection. Made call to patient. No answer. Left voicemail to return phone call.  If she calls back, let her knoe that according to UA we do not need to recollect nor does she need to have an antibiotic.

## 2016-02-05 NOTE — Progress Notes (Signed)
Name: Elizabeth [name]   MRN: [id number]    DOB: 01/27/1966   Date:02/05/2016       Progress Note  Subjective  Chief Complaint  Chief Complaint  Patient presents with  . Urinary Tract Infection    pt seeing blood in urine  . Abdominal Pain    HPI  Elizabeth [name] is a 50 year old female with recent sigmoid colectomy on 01/24/16 and had urinary cath place for about 2 days for this. Since then she is having some vague urinary symptoms described as discomfort with urination but not increased frequency. Pelvic pain is present but more from her surgery. No fevers, no flank pain. She started drinking more cranberry juice which has helped a bit.   Past Medical History  Diagnosis Date  . Irritable bowel syndrome   . RA (rheumatoid arthritis) ([place])   . Bipolar 1 disorder ([place])   . Insomnia   . GERD (gastroesophageal reflux disease)   . Diverticulosis   . Motion sickness     boats  . Hepatitis     B - "not active"  . Heart murmur     followed by PCP  . Diabetes mellitus without complication ([place])   . Depression     Social History  Substance Use Topics  . Smoking status: Former Smoker -- 0.50 packs/day    Types: Cigarettes    Quit date: 05/06/2015  . Smokeless tobacco: Never Used  . Alcohol Use: No     Current outpatient prescriptions:  .  aspirin EC 81 MG tablet, Take 81 mg by mouth daily., Disp: , Rfl:  .  B-D INS SYR ULTRAFINE 1CC/31G 31G X 5/16" 1 ML MISC, use to inject METHOTREXATE, Disp: , Rfl: 0 .  BISACODYL 5 MG EC tablet, Take 4 tablets at 8AM the day of your surgery., Disp: 4 tablet, Rfl: 0 .  Calcium Carbonate-Vitamin D (CALCIUM-VITAMIN D) 500-200 MG-UNIT tablet, Take 2 tablets by mouth daily. , Disp: , Rfl:  .  enalapril (VASOTEC) 5 MG tablet, Take 1 tablet (5 mg total) by mouth daily., Disp: 90 tablet, Rfl: 1 .  folic acid (FOLVITE) 1 MG tablet, Take 1 mg by mouth daily., Disp: , Rfl: 0 .  glucose blood (ACCU-CHEK SMARTVIEW) test strip, Use as instructed, Disp: 100 each,  Rfl: 12 .  Homeopathic Products (SIMILASAN DRY EYE RELIEF) SOLN, Apply 2 drops to eye as needed (to each eye). , Disp: , Rfl:  .  Hydrocodone-Acetaminophen (VICODIN) 5-300 MG TABS, Take 1 tablet by mouth every 4 (four) hours as needed., Disp: 30 each, Rfl: 0 .  Lancets (ACCU-CHEK SOFT TOUCH) lancets, 1 each by Other route 3 (three) times daily., Disp: 100 each, Rfl: 12 .  lansoprazole (PREVACID) 30 MG capsule, take 1 capsule by mouth once daily 30 MINUTES PRIOR TO BREAKFAST, Disp: , Rfl: 1 .  linagliptin (TRADJENTA) 5 MG TABS tablet, Take 1 tablet (5 mg total) by mouth daily., Disp: 90 tablet, Rfl: 1 .  LINZESS 290 MCG CAPS capsule, Take 290 mcg by mouth every morning., Disp: , Rfl: 1 .  meloxicam (MOBIC) 15 MG tablet, Take 15 mg by mouth daily., Disp: , Rfl: 0 .  metFORMIN (GLUCOPHAGE) 1000 MG tablet, Take 1 tablet (1,000 mg total) by mouth 2 (two) times daily with a meal., Disp: 180 tablet, Rfl: 3 .  methotrexate 50 MG/2ML injection, inject 0.6 milliliters subcutaneously every week, Disp: , Rfl: 0 .  OLANZapine (ZYPREXA) 15 MG tablet, Take 15 mg by mouth  at bedtime. , Disp: , Rfl: 0 .  polyethylene glycol powder (GLYCOLAX/MIRALAX) powder, take 255 grams by mouth as directed AT ONCE, Disp: , Rfl: 0 .  traZODone (DESYREL) 100 MG tablet, Take 100 mg by mouth at bedtime as needed. , Disp: , Rfl:   Allergies  Allergen Reactions  . Penicillins Hives    ROS  Positive for dysuria as mentioned in HPI, otherwise all systems reviewed and are negative.  Objective  Filed Vitals:   02/05/16 1020  BP: 122/68  Pulse: 104  Temp: 98.7 F (37.1 C)  Resp: 20  Height: 5\' 7"  (1.702 m)  Weight: 201 lb 4 oz (91.286 kg)  SpO2: 96%   Body mass index is 31.51 kg/(m^2).   Physical Exam  Constitutional: Patient is overweight and well-nourished. In no acute distress but does appear to be uncomfortable from acute illness. Cardiovascular: Normal rate, regular rhythm and normal heart sounds.  No murmur  heard.  Pulmonary/Chest: Effort normal and breath sounds normal. No respiratory distress. Abdomen: Soft with normal bowel sounds. Surgical wounds healing well.  Genitourinary: Exam deferred. Skin: Skin is warm and dry. No rash noted. No erythema.  Psychiatric: Patient has a normal mood and affect. Behavior is normal in office today. Judgment and thought content normal in office today.  Results for orders placed or performed in visit on 02/05/16 (from the past 24 hour(s))  POCT urinalysis dipstick     Status: None   Collection Time: 02/05/16 10:29 AM  Result Value Ref Range   Color, UA amber    Clarity, UA clear    Glucose, UA neg    Bilirubin, UA neg    Ketones, UA trace    Spec Grav, UA 1.020    Blood, UA mod    pH, UA 6.0    Protein, UA neg    Urobilinogen, UA negative    Nitrite, UA neg    Leukocytes, UA Negative Negative    Assessment & Plan  1. Burning with urination No Nitrates or leukocytes on Udip, will get culture due to hematuria. May have had some trauma/irritation from catheter during surgery a few weeks ago. Instructed patient on increasing hydration with water. May use Azo for symptomatic relief if not already doing so but not recommended to be used beyond 2-3 days.   If symptoms persist I recommend Urology consultation.  - POCT urinalysis dipstick - Urine Culture

## 2016-02-06 LAB — URINE CULTURE

## 2016-02-07 ENCOUNTER — Encounter: Payer: Self-pay | Admitting: Family Medicine

## 2016-02-11 ENCOUNTER — Inpatient Hospital Stay
Admission: EM | Admit: 2016-02-11 | Discharge: 2016-02-13 | DRG: 684 | Disposition: A | Discharge status: Home / Self Care | Payer: Medicare Other | Attending: Internal Medicine | Admitting: Internal Medicine

## 2016-02-11 ENCOUNTER — Encounter: Payer: Self-pay | Admitting: Internal Medicine

## 2016-02-11 DIAGNOSIS — N170 Acute kidney failure with tubular necrosis: Principal | ICD-10-CM | POA: Diagnosis present

## 2016-02-11 DIAGNOSIS — R319 Hematuria, unspecified: Secondary | ICD-10-CM | POA: Diagnosis present

## 2016-02-11 DIAGNOSIS — Z9851 Tubal ligation status: Secondary | ICD-10-CM

## 2016-02-11 DIAGNOSIS — E119 Type 2 diabetes mellitus without complications: Secondary | ICD-10-CM | POA: Diagnosis present

## 2016-02-11 DIAGNOSIS — K219 Gastro-esophageal reflux disease without esophagitis: Secondary | ICD-10-CM | POA: Diagnosis present

## 2016-02-11 DIAGNOSIS — F17210 Nicotine dependence, cigarettes, uncomplicated: Secondary | ICD-10-CM | POA: Diagnosis present

## 2016-02-11 DIAGNOSIS — N179 Acute kidney failure, unspecified: Secondary | ICD-10-CM

## 2016-02-11 DIAGNOSIS — T783XXA Angioneurotic edema, initial encounter: Secondary | ICD-10-CM | POA: Diagnosis not present

## 2016-02-11 DIAGNOSIS — E875 Hyperkalemia: Secondary | ICD-10-CM | POA: Diagnosis not present

## 2016-02-11 DIAGNOSIS — F319 Bipolar disorder, unspecified: Secondary | ICD-10-CM | POA: Diagnosis present

## 2016-02-11 DIAGNOSIS — Z9049 Acquired absence of other specified parts of digestive tract: Secondary | ICD-10-CM

## 2016-02-11 DIAGNOSIS — Z79899 Other long term (current) drug therapy: Secondary | ICD-10-CM

## 2016-02-11 DIAGNOSIS — Z880 Allergy status to penicillin: Secondary | ICD-10-CM | POA: Diagnosis not present

## 2016-02-11 DIAGNOSIS — Z833 Family history of diabetes mellitus: Secondary | ICD-10-CM

## 2016-02-11 DIAGNOSIS — R809 Proteinuria, unspecified: Secondary | ICD-10-CM | POA: Diagnosis present

## 2016-02-11 DIAGNOSIS — R011 Cardiac murmur, unspecified: Secondary | ICD-10-CM | POA: Diagnosis present

## 2016-02-11 DIAGNOSIS — Z7982 Long term (current) use of aspirin: Secondary | ICD-10-CM | POA: Diagnosis not present

## 2016-02-11 DIAGNOSIS — M069 Rheumatoid arthritis, unspecified: Secondary | ICD-10-CM | POA: Diagnosis present

## 2016-02-11 DIAGNOSIS — Z888 Allergy status to other drugs, medicaments and biological substances status: Secondary | ICD-10-CM | POA: Diagnosis not present

## 2016-02-11 DIAGNOSIS — T783XXD Angioneurotic edema, subsequent encounter: Secondary | ICD-10-CM | POA: Diagnosis not present

## 2016-02-11 DIAGNOSIS — E1129 Type 2 diabetes mellitus with other diabetic kidney complication: Secondary | ICD-10-CM | POA: Diagnosis not present

## 2016-02-11 DIAGNOSIS — T464X5A Adverse effect of angiotensin-converting-enzyme inhibitors, initial encounter: Secondary | ICD-10-CM | POA: Diagnosis present

## 2016-02-11 DIAGNOSIS — I10 Essential (primary) hypertension: Secondary | ICD-10-CM | POA: Diagnosis present

## 2016-02-11 DIAGNOSIS — Z811 Family history of alcohol abuse and dependence: Secondary | ICD-10-CM | POA: Diagnosis not present

## 2016-02-11 DIAGNOSIS — Z8249 Family history of ischemic heart disease and other diseases of the circulatory system: Secondary | ICD-10-CM | POA: Diagnosis not present

## 2016-02-11 DIAGNOSIS — R1310 Dysphagia, unspecified: Secondary | ICD-10-CM | POA: Diagnosis not present

## 2016-02-11 DIAGNOSIS — Z9889 Other specified postprocedural states: Secondary | ICD-10-CM

## 2016-02-11 DIAGNOSIS — I701 Atherosclerosis of renal artery: Secondary | ICD-10-CM | POA: Diagnosis present

## 2016-02-11 DIAGNOSIS — M1990 Unspecified osteoarthritis, unspecified site: Secondary | ICD-10-CM | POA: Diagnosis present

## 2016-02-11 LAB — CBC WITH DIFFERENTIAL/PLATELET
BASOS ABS: 0 10*3/uL (ref 0–0.1)
BASOS PCT: 1 %
EOS PCT: 3 %
Eosinophils Absolute: 0.3 10*3/uL (ref 0–0.7)
HCT: 35.9 % (ref 35.0–47.0)
Hemoglobin: 12.1 g/dL (ref 12.0–16.0)
Lymphocytes Relative: 26 %
Lymphs Abs: 2 10*3/uL (ref 1.0–3.6)
MCH: 29.9 pg (ref 26.0–34.0)
MCHC: 33.6 g/dL (ref 32.0–36.0)
MCV: 88.9 fL (ref 80.0–100.0)
MONO ABS: 0.6 10*3/uL (ref 0.2–0.9)
Monocytes Relative: 8 %
Neutro Abs: 4.8 10*3/uL (ref 1.4–6.5)
Neutrophils Relative %: 62 %
PLATELETS: 372 10*3/uL (ref 150–440)
RBC: 4.03 MIL/uL (ref 3.80–5.20)
RDW: 14.7 % — AB (ref 11.5–14.5)
WBC: 7.8 10*3/uL (ref 3.6–11.0)

## 2016-02-11 LAB — BASIC METABOLIC PANEL
ANION GAP: 11 (ref 5–15)
BUN: 45 mg/dL — ABNORMAL HIGH (ref 6–20)
CHLORIDE: 112 mmol/L — AB (ref 101–111)
CO2: 18 mmol/L — ABNORMAL LOW (ref 22–32)
CREATININE: 6.08 mg/dL — AB (ref 0.44–1.00)
Calcium: 7.2 mg/dL — ABNORMAL LOW (ref 8.9–10.3)
GFR calc non Af Amer: 7 mL/min — ABNORMAL LOW (ref >60)
GFR, EST AFRICAN AMERICAN: 9 mL/min — AB (ref >60)
Glucose, Bld: 141 mg/dL — ABNORMAL HIGH (ref 65–99)
POTASSIUM: 4.7 mmol/L (ref 3.5–5.1)
SODIUM: 141 mmol/L (ref 135–145)

## 2016-02-11 LAB — COMPREHENSIVE METABOLIC PANEL
ALBUMIN: 3.3 g/dL — AB (ref 3.5–5.0)
ALK PHOS: 75 U/L (ref 38–126)
ALT: 10 U/L — ABNORMAL LOW (ref 14–54)
ANION GAP: 11 (ref 5–15)
AST: 9 U/L — ABNORMAL LOW (ref 15–41)
BUN: 49 mg/dL — ABNORMAL HIGH (ref 6–20)
CALCIUM: 7.9 mg/dL — AB (ref 8.9–10.3)
CHLORIDE: 110 mmol/L (ref 101–111)
CO2: 18 mmol/L — AB (ref 22–32)
Creatinine, Ser: 7.01 mg/dL — ABNORMAL HIGH (ref 0.44–1.00)
GFR calc Af Amer: 7 mL/min — ABNORMAL LOW (ref >60)
GFR calc non Af Amer: 6 mL/min — ABNORMAL LOW (ref >60)
GLUCOSE: 107 mg/dL — AB (ref 65–99)
Potassium: 4.5 mmol/L (ref 3.5–5.1)
SODIUM: 139 mmol/L (ref 135–145)
Total Bilirubin: 0.5 mg/dL (ref 0.3–1.2)
Total Protein: 7.2 g/dL (ref 6.5–8.1)

## 2016-02-11 LAB — URINALYSIS COMPLETE WITH MICROSCOPIC (ARMC ONLY)
Bacteria, UA: NONE SEEN
Bilirubin Urine: NEGATIVE
GLUCOSE, UA: 50 mg/dL — AB
Ketones, ur: NEGATIVE mg/dL
LEUKOCYTES UA: NEGATIVE
NITRITE: NEGATIVE
Protein, ur: 100 mg/dL — AB
SPECIFIC GRAVITY, URINE: 1.009 (ref 1.005–1.030)
pH: 6 (ref 5.0–8.0)

## 2016-02-11 LAB — GLUCOSE, CAPILLARY
GLUCOSE-CAPILLARY: 166 mg/dL — AB (ref 65–99)
GLUCOSE-CAPILLARY: 135 mg/dL — AB (ref 65–99)
Glucose-Capillary: 195 mg/dL — ABNORMAL HIGH (ref 65–99)

## 2016-02-11 MED ORDER — ACETAMINOPHEN 650 MG RE SUPP: 650.0000 mg | Freq: Four times a day (QID) | RECTAL | Status: DC | PRN

## 2016-02-11 MED ORDER — INSULIN ASPART 100 UNIT/ML ~~LOC~~ SOLN
0.0000 [IU] | Freq: Three times a day (TID) | SUBCUTANEOUS | Status: DC
  Administered 2016-02-11 (×2): 2 [IU] via SUBCUTANEOUS
  Administered 2016-02-12: 1 [IU] via SUBCUTANEOUS
  Administered 2016-02-12: 3 [IU] via SUBCUTANEOUS
  Administered 2016-02-12: 2 [IU] via SUBCUTANEOUS
  Filled 2016-02-11: qty 2
  Filled 2016-02-11: qty 3
  Filled 2016-02-11 (×2): qty 2
  Filled 2016-02-11: qty 1

## 2016-02-11 MED ORDER — METHYLPREDNISOLONE SODIUM SUCC 125 MG IJ SOLR
125.0000 mg | Freq: Once | INTRAMUSCULAR | Status: AC
  Administered 2016-02-11: 125 mg via INTRAVENOUS
  Filled 2016-02-11: qty 2

## 2016-02-11 MED ORDER — FAMOTIDINE IN NACL 20-0.9 MG/50ML-% IV SOLN
20.0000 mg | Freq: Two times a day (BID) | INTRAVENOUS | Status: DC
  Administered 2016-02-11 – 2016-02-12 (×3): 20 mg via INTRAVENOUS
  Filled 2016-02-11 (×5): qty 50

## 2016-02-11 MED ORDER — METHYLPREDNISOLONE SODIUM SUCC 125 MG IJ SOLR
60.0000 mg | Freq: Three times a day (TID) | INTRAMUSCULAR | Status: DC
  Administered 2016-02-11 – 2016-02-12 (×3): 60 mg via INTRAVENOUS
  Filled 2016-02-11 (×3): qty 2

## 2016-02-11 MED ORDER — LINACLOTIDE 290 MCG PO CAPS
290.0000 ug | ORAL_CAPSULE | ORAL | Status: DC
  Administered 2016-02-12 – 2016-02-13 (×2): 290 ug via ORAL
  Filled 2016-02-11 (×2): qty 1

## 2016-02-11 MED ORDER — OLANZAPINE 7.5 MG PO TABS: 15.0000 mg | ORAL_TABLET | Freq: Every day | ORAL | Status: DC

## 2016-02-11 MED ORDER — SODIUM CHLORIDE 0.9 % IV BOLUS (SEPSIS)
1000.0000 mL | INTRAVENOUS | Status: AC
  Administered 2016-02-11: 1000 mL via INTRAVENOUS

## 2016-02-11 MED ORDER — INSULIN ASPART 100 UNIT/ML ~~LOC~~ SOLN: 0.0000 [IU] | Freq: Every day | SUBCUTANEOUS | Status: DC

## 2016-02-11 MED ORDER — ACETAMINOPHEN 325 MG PO TABS
650.0000 mg | ORAL_TABLET | Freq: Four times a day (QID) | ORAL | Status: DC | PRN
Start: 2016-02-11 — End: 2016-02-13
  Administered 2016-02-13: 650 mg via ORAL
  Filled 2016-02-11: qty 2

## 2016-02-11 MED ORDER — DIPHENHYDRAMINE HCL 50 MG/ML IJ SOLN
25.0000 mg | Freq: Once | INTRAMUSCULAR | Status: AC
  Administered 2016-02-11: 25 mg via INTRAVENOUS
  Filled 2016-02-11: qty 1

## 2016-02-11 MED ORDER — SODIUM CHLORIDE 0.9 % IV SOLN: Freq: Once | INTRAVENOUS | Status: DC

## 2016-02-11 MED ORDER — OXYCODONE HCL 5 MG PO TABS: 5.0000 mg | ORAL_TABLET | ORAL | Status: DC | PRN

## 2016-02-11 MED ORDER — DIPHENHYDRAMINE HCL 25 MG PO CAPS
25.0000 mg | ORAL_CAPSULE | Freq: Three times a day (TID) | ORAL | Status: DC
  Administered 2016-02-11 – 2016-02-13 (×7): 25 mg via ORAL
  Filled 2016-02-11 (×7): qty 1

## 2016-02-11 MED ORDER — SODIUM CHLORIDE 0.9 % IV SOLN
INTRAVENOUS | Status: DC
  Administered 2016-02-11 – 2016-02-13 (×5): via INTRAVENOUS

## 2016-02-11 MED ORDER — CALCIUM CARBONATE-VITAMIN D 500-200 MG-UNIT PO TABS
2.0000 | ORAL_TABLET | Freq: Every day | ORAL | Status: DC
  Administered 2016-02-12 – 2016-02-13 (×2): 2 via ORAL
  Filled 2016-02-11 (×4): qty 2

## 2016-02-11 MED ORDER — ASPIRIN EC 81 MG PO TBEC
81.0000 mg | DELAYED_RELEASE_TABLET | Freq: Every day | ORAL | Status: DC
  Administered 2016-02-12 – 2016-02-13 (×2): 81 mg via ORAL
  Filled 2016-02-11 (×2): qty 1

## 2016-02-11 MED ORDER — HEPARIN SODIUM (PORCINE) 5000 UNIT/ML IJ SOLN
5000.0000 [IU] | Freq: Three times a day (TID) | INTRAMUSCULAR | Status: DC
  Administered 2016-02-11 – 2016-02-13 (×6): 5000 [IU] via SUBCUTANEOUS
  Filled 2016-02-11 (×6): qty 1

## 2016-02-11 MED ORDER — FOLIC ACID 1 MG PO TABS
1.0000 mg | ORAL_TABLET | Freq: Every day | ORAL | Status: DC
  Administered 2016-02-13: 1 mg via ORAL
  Filled 2016-02-11 (×2): qty 1

## 2016-02-11 MED ORDER — FAMOTIDINE IN NACL 20-0.9 MG/50ML-% IV SOLN
20.0000 mg | Freq: Once | INTRAVENOUS | Status: AC
  Administered 2016-02-11: 20 mg via INTRAVENOUS
  Filled 2016-02-11: qty 50

## 2016-02-11 NOTE — ED Provider Notes (Addendum)
[facility] Emergency Department Provider Note  ____________________________________________  Time seen: Approximately 5:00 AM  I have reviewed the triage vital signs and the nursing notes.   HISTORY  Chief Complaint Facial Swelling    HPI Elizabeth [name] is a 50 y.o. female with a history of hypertension who started on enalapril approximately one month ago who presents for evaluation of swelling in her face and a sensation that she is having difficulty swallowing.  She first noticed the symptoms, which started a swelling of her lips, about 4 days ago.  They have been gradual in onset and were at their worst yesterday.  She also had a scratchy throat but not specifically complaining of pain.  Yesterday her lips in primarily her upper lip was extremely swollen and she still had a mild sore throat but then the lip swelling started to improve.  She woke up during the night, however, and felt the sensation that it was difficult to swallow which scared her so she came to the emergency department for evaluation.  She is not having any trouble breathing and is not wheezing.  Her voice is somewhat hoarse and she still has a little bit of a scratchy throat and sensation of fullness.  She feels like she is having difficulty swallowing but she is tolerating her secretions without difficulty.  She has had no headache, neck pain, fever, chills, chest pain, abdominal pain, nausea, vomiting, diarrhea.  She has had no rashor itching.  She has never had anything like this happen before and describes the symptoms as severe.  She knows of no family members who have had similar issues.  Nothing is making the symptoms better.  She recently had surgery for diverticulitis and still has staples in place.  She apparently tolerated anesthesia well and had completely recovered from the surgery in terms of airway and breathing.   Past Medical History  Diagnosis Date  . Irritable bowel syndrome   .  RA (rheumatoid arthritis) ([place])   . Bipolar 1 disorder ([place])   . Insomnia   . GERD (gastroesophageal reflux disease)   . Diverticulosis   . Motion sickness     boats  . Hepatitis     B - "not active"  . Heart murmur     followed by PCP  . Diabetes mellitus without complication ([place])   . Depression     Patient Active Problem List   Diagnosis Date Noted  . Burning with urination 02/05/2016  . Diverticula of colon 01/24/2016  . Hypertension goal BP (blood pressure) < 140/90 01/17/2016  . Annual physical exam 11/29/2015  . Bursitis, trochanteric 11/22/2015  . Breast mass, right 11/08/2015  . Inversion, nipple 11/08/2015  . Diabetes mellitus type 2, uncontrolled, without complications ([place]) [id number]  . Elevated liver enzymes 10/23/2015  . RA (rheumatoid arthritis) ([place]) 10/23/2015  . Rheumatoid arthritis involving both hands ([place]) 10/23/2015  . Screening for STD (sexually transmitted disease) 10/23/2015  . HIV exposure 10/23/2015  . Trouble swallowing   . Benign neoplasm of descending colon   . Benign neoplasm of sigmoid colon   . Bipolar disorder, current episode depressed, moderate ([place]) 07/03/2015  . Chronic rheumatic arthritis ([place]) 07/03/2015  . IBS (irritable bowel syndrome) 07/03/2015  . Insomnia, uncontrolled 07/03/2015  . GERD without esophagitis 07/03/2015  . Obesity, Class II, BMI 35-39.9 07/03/2015  . Bilateral hip bursitis 07/03/2015  . Diverticulitis large intestine 05/07/2015    Past Surgical History  Procedure Laterality Date  . Tubal  ligation    . Esophagogastroduodenoscopy (egd) with esophageal dilation    . Cholecystectomy  1997    [place]  . Colonoscopy with propofol N/A 09/07/2015    Procedure: COLONOSCOPY WITH PROPOFOL;  Surgeon: Lucilla Lame, MD;  Location: [place];  Service: Endoscopy;  Laterality: N/A;  . Esophagogastroduodenoscopy (egd) with propofol N/A 09/07/2015    Procedure: ESOPHAGOGASTRODUODENOSCOPY (EGD) WITH PROPOFOL;   Surgeon: Lucilla Lame, MD;  Location: [place];  Service: Endoscopy;  Laterality: N/A;  . Polypectomy N/A 09/07/2015    Procedure: POLYPECTOMY;  Surgeon: Lucilla Lame, MD;  Location: [place];  Service: Endoscopy;  Laterality: N/A;  . Partial colectomy N/A 01/24/2016    Procedure: SIGMOID COLECTOMY;  Surgeon: Florene Glen, MD;  Location: [facility];  Service: General;  Laterality: N/A;    Current Outpatient Rx  Name  Route  Sig  Dispense  Refill  . aspirin EC 81 MG tablet   Oral   Take 81 mg by mouth daily.         . B-D INS SYR ULTRAFINE 1CC/31G 31G X 5/16" 1 ML MISC      use to inject METHOTREXATE      0     Dispense as written.   Marland Kitchen BISACODYL 5 MG EC tablet      Take 4 tablets at 8AM the day of your surgery.   4 tablet   0     Dispense as written.   . Calcium Carbonate-Vitamin D (CALCIUM-VITAMIN D) 500-200 MG-UNIT tablet   Oral   Take 2 tablets by mouth daily.          . enalapril (VASOTEC) 5 MG tablet   Oral   Take 1 tablet (5 mg total) by mouth daily.   90 tablet   1   . folic acid (FOLVITE) 1 MG tablet   Oral   Take 1 mg by mouth daily.      0   . glucose blood (ACCU-CHEK SMARTVIEW) test strip      Use as instructed   100 each   12   . Homeopathic Products (SIMILASAN DRY EYE RELIEF) SOLN   Ophthalmic   Apply 2 drops to eye as needed (to each eye).          . Hydrocodone-Acetaminophen (VICODIN) 5-300 MG TABS   Oral   Take 1 tablet by mouth every 4 (four) hours as needed.   30 each   0   . Lancets (ACCU-CHEK SOFT TOUCH) lancets   Other   1 each by Other route 3 (three) times daily.   100 each   12   . lansoprazole (PREVACID) 30 MG capsule      take 1 capsule by mouth once daily 30 MINUTES PRIOR TO BREAKFAST      1   . linagliptin (TRADJENTA) 5 MG TABS tablet   Oral   Take 1 tablet (5 mg total) by mouth daily.   90 tablet   1   . LINZESS 290 MCG CAPS capsule   Oral   Take 290 mcg by mouth every morning.       1     Dispense as written.   . meloxicam (MOBIC) 15 MG tablet   Oral   Take 15 mg by mouth daily.      0   . metFORMIN (GLUCOPHAGE) 1000 MG tablet   Oral   Take 1 tablet (1,000 mg total) by mouth 2 (two) times daily with a meal.  180 tablet   3   . methotrexate 50 MG/2ML injection      inject 0.6 milliliters subcutaneously every week      0   . OLANZapine (ZYPREXA) 15 MG tablet   Oral   Take 15 mg by mouth at bedtime.       0   . polyethylene glycol powder (GLYCOLAX/MIRALAX) powder      take 255 grams by mouth as directed AT ONCE      0   . traZODone (DESYREL) 100 MG tablet   Oral   Take 100 mg by mouth at bedtime as needed.            Allergies Ace inhibitors; Enalapril; and Penicillins  Family History  Problem Relation Age of Onset  . Hypertension Mother   . Diabetes Mother   . Diverticulosis Mother   . Alcohol abuse Father   . Diverticulosis Maternal Aunt   . Diverticulosis Maternal Grandmother     Social History Social History  Substance Use Topics  . Smoking status: Former Smoker -- 0.50 packs/day    Types: Cigarettes    Quit date: 05/06/2015  . Smokeless tobacco: Never Used  . Alcohol Use: No    Review of Systems Constitutional: No fever/chills Eyes: No visual changes. ENT: Scratchy throat and the sensation of difficulty swallowing.  Swelling of her lips and possibly her throat Cardiovascular: Denies chest pain. Respiratory: Denies shortness of breath. Gastrointestinal: No abdominal pain.  No nausea, no vomiting.  No diarrhea.  No constipation. Genitourinary: Negative for dysuria. Musculoskeletal: Negative for back pain. Skin: Negative for rash. Neurological: Negative for headaches, focal weakness or numbness.  10-point ROS otherwise negative.  ____________________________________________   PHYSICAL EXAM:  VITAL SIGNS: ED Triage Vitals  Enc Vitals Group     BP --      Pulse Rate 02/11/16 0443 106     Resp 02/11/16 0443  20     Temp 02/11/16 0443 98.1 F (36.7 C)     Temp Source 02/11/16 0443 Oral     SpO2 --      Weight 02/11/16 0443 201 lb (91.173 kg)     Height 02/11/16 0443 5\' 7"  (1.702 m)     Head Cir --      Peak Flow --      Pain Score --      Pain Loc --      Pain Edu? --      Excl. in [place]? --     Constitutional: Alert and oriented. Well appearing and in no acute distress but does appear mildly anxious Eyes: Conjunctivae are normal. PERRL. EOMI. Head: Atraumatic. Nose: No congestion/rhinnorhea. Mouth/Throat: Upper lip is swollen from baseline according to her and her husband less so than it was yesterday.  Mucous membranes are moist.  Oropharynx non-erythematous.  No acute dental infection or abscess.  Mallampati I, easily visualizing the back of the throat with no obvious edema.  No brawny induration under tongue or submandibular.  Hoarse voice Neck: No stridor.   Cardiovascular: Normal rate, regular rhythm. Grossly normal heart sounds.  Good peripheral circulation. Respiratory: Normal respiratory effort.  No retractions. Lungs CTAB. Gastrointestinal: Soft with tenderness around her recent surgical wound (staples still in place). No distention. No abdominal bruits. No CVA tenderness. Musculoskeletal: No lower extremity tenderness nor edema.  No joint effusions. Neurologic:  Normal speech and language. No gross focal neurologic deficits are appreciated.  Skin:  Skin is warm, dry and intact. No rash noted.  Psychiatric: Mood and affect are mildly anxious.  ____________________________________________   LABS (all labs ordered are listed, but only abnormal results are displayed)  Labs Reviewed  COMPREHENSIVE METABOLIC PANEL - Abnormal; Notable for the following:    CO2 18 (*)    Glucose, Bld 107 (*)    BUN 49 (*)    Creatinine, Ser 7.01 (*)    Calcium 7.9 (*)    Albumin 3.3 (*)    AST 9 (*)    ALT 10 (*)    GFR calc non Af Amer 6 (*)    GFR calc Af Amer 7 (*)    All other components  within normal limits  CBC WITH DIFFERENTIAL/PLATELET - Abnormal; Notable for the following:    RDW 14.7 (*)    All other components within normal limits  URINALYSIS COMPLETEWITH MICROSCOPIC ([facility] ONLY) - Abnormal; Notable for the following:    Color, Urine YELLOW (*)    APPearance HAZY (*)    Glucose, UA 50 (*)    Hgb urine dipstick 1+ (*)    Protein, ur 100 (*)    Squamous Epithelial / LPF 0-5 (*)    All other components within normal limits  BASIC METABOLIC PANEL - Abnormal; Notable for the following:    Chloride 112 (*)    CO2 18 (*)    Glucose, Bld 141 (*)    BUN 45 (*)    Creatinine, Ser 6.08 (*)    Calcium 7.2 (*)    GFR calc non Af Amer 7 (*)    GFR calc Af Amer 9 (*)    All other components within normal limits   ____________________________________________  EKG  None ____________________________________________  RADIOLOGY   No results found.  ____________________________________________   PROCEDURES  Procedure(s) performed: None  Critical Care performed: No ____________________________________________   INITIAL IMPRESSION / ASSESSMENT AND PLAN / ED COURSE  Pertinent labs & imaging results that were available during my care of the patient were reviewed by me and considered in my medical decision making (see chart for details).  The patient has angioedema, likely from ACE inhibitor.  She is protecting her airway and tolerating her secretions at this time.  Her presentation is relatively mild although the hoarse voice is somewhat concerning.  She has no brawny induration in her neck and her oropharynx is easily visualized.  She has no obvious dental abscesses and I do not believe this is an infectious source, such as Ludwig's angina.  Given that the symptoms have been gradual in onset and actually seemed to somewhat be getting better in terms of her lips although the throat feels worse tonight, I will observe her in the emergency department to see if she gets  better with empiric treatment for allergic reaction.  I anticipate I will admit her for close observation of her angioedema but I do not believe ENT needs to be consult that at this time.  ----------------------------------------- 6:49 AM on 02/11/2016 -----------------------------------------  Patient's symptoms are unchanged and she continues to handle her secretions and breathe easily and comfortably.  However, her CMP came back with a creatinine of 7.  I suspect this is a lab error and I am sending a repeat BMP to verify.  Starting 1 L NS bolus.  ----------------------------------------- 7:50 AM on 02/11/2016 -----------------------------------------  The initial repeat blood sample hemolyzed.  A another sample is being drawn and I have reordered the BMP.  She is unchanged as far as her angioedema is concerned.    -----------------------------------------  8:20 AM on 02/11/2016 -----------------------------------------  The patient's repeat creatinine came back greater than 6.  I updated her and have let the hospitalist know that she is in acute renal failure of unspecified etiology.  She has continued to make urine and left a urine sample for [place] in the emergency department.  Her angioedema is unchanged since her arrival and she continues to tolerate her own secretions and is breathing comfortably still with a hoarse voice.  I encouraged consultation of ENT while she is inpatient but does not need emergent intervention at this time. ____________________________________________  FINAL CLINICAL IMPRESSION(S) / ED DIAGNOSES  Final diagnoses:  Angioedema, initial encounter  Dysphagia  Acute renal failure, unspecified acute renal failure type ([place])     NEW MEDICATIONS STARTED DURING THIS VISIT:  New Prescriptions   No medications on file      Note:  This document was prepared using Dragon voice recognition software and may include unintentional dictation errors.    Hinda Kehr, MD 02/11/16 [phone]

## 2016-02-11 NOTE — ED Notes (Signed)
Pt transported from ER to 217 by this tech, pt handed off the RN Abbie in room

## 2016-02-11 NOTE — Consult Note (Signed)
Date: 02/11/2016                  Patient Name:  Elizabeth [name]  MRN: [id number]  DOB: [birth year]  Age / Sex: 50 y.o., female         PCP: Bobetta Lime, MD                 Service Requesting Consult:  internal medicine                  Reason for Consult:  acute renal failure             History of Present Illness: Patient is a 50 y.o. female with medical problems of rheumatoid arthritis, treated with methotrexate and prednisone, diabetes, hypertension, recent surgery sigmoid colectomy on 01/24/2016, for recurrent diverticulitis who was admitted to [facility] on 02/11/2016 for evaluation of intractable nausea and vomiting that started 1 day prior to admission.  Patient reports that last Wednesday, she restarted all her outpatient medications including methotrexate, meloxicam, Glucophage, enalapril. Last week, she had significant upper lip and throat swelling. It was thought to be angioedema secondary to ACE inhibitor which has since been stopped. This time she presents for nausea and vomiting Baseline creatinine appears to be 0.65 from February 17 Admission creatinine is 7.01 Repeat creatinine this morning was 6.08 Patient denies any gross hematuria. She is able to void without any problems. She has not had any renal imaging.   Medications: Outpatient medications: Prescriptions prior to admission  Medication Sig Dispense Refill Last Dose  . aspirin EC 81 MG tablet Take 81 mg by mouth daily.   02/09/2016  . B-D INS SYR ULTRAFINE 1CC/31G 31G X 5/16" 1 ML MISC use to inject METHOTREXATE  0 02/07/2016  . Calcium Carbonate-Vitamin D (CALCIUM-VITAMIN D) 500-200 MG-UNIT tablet Take 2 tablets by mouth daily.    [id number]  . folic acid (FOLVITE) 1 MG tablet Take 1 mg by mouth daily.  0 02/10/2016 at Unknown time  . glucose blood (ACCU-CHEK SMARTVIEW) test strip Use as instructed 100 each 12 Past Week at Unknown time  . Hydrocodone-Acetaminophen (VICODIN) 5-300 MG TABS Take 1 tablet by mouth every 4 (four)  hours as needed. 30 each 0 haven't taken  . Lancets (ACCU-CHEK SOFT TOUCH) lancets 1 each by Other route 3 (three) times daily. 100 each 12 Past Week at Unknown time  . lansoprazole (PREVACID) 30 MG capsule take 1 capsule by mouth once daily 30 MINUTES PRIOR TO BREAKFAST  1 02/10/2016 at Unknown time  . linagliptin (TRADJENTA) 5 MG TABS tablet Take 1 tablet (5 mg total) by mouth daily. 90 tablet 1 02/10/2016 at Unknown time  . LINZESS 290 MCG CAPS capsule Take 290 mcg by mouth every morning.  1 02/10/2016 at Unknown time  . meloxicam (MOBIC) 15 MG tablet Take 15 mg by mouth daily.  0 02/10/2016 at Unknown time  . metFORMIN (GLUCOPHAGE) 1000 MG tablet Take 1 tablet (1,000 mg total) by mouth 2 (two) times daily with a meal. 180 tablet 3 02/10/2016 at Unknown time  . methotrexate 50 MG/2ML injection inject 0.6 milliliters subcutaneously every week on Wednesday.  0 02/07/2016  . OLANZapine (ZYPREXA) 15 MG tablet Take 15 mg by mouth at bedtime as needed (for bipolar.).   0 Past Month at Unknown time  . traZODone (DESYREL) 100 MG tablet Take 100 mg by mouth at bedtime as needed for sleep.    02/08/2016    Current medications: Current Facility-Administered Medications  Medication Dose Route Frequency Provider Last Rate Last Dose  . 0.9 %  sodium chloride infusion   Intravenous Continuous Loletha Grayer, MD 100 mL/hr at 02/11/16 1114    . acetaminophen (TYLENOL) tablet 650 mg  650 mg Oral Q6H PRN Loletha Grayer, MD       Or  . acetaminophen (TYLENOL) suppository 650 mg  650 mg Rectal Q6H PRN Loletha Grayer, MD      . aspirin EC tablet 81 mg  81 mg Oral Daily Loletha Grayer, MD   81 mg at 02/11/16 1100  . calcium-vitamin D (OSCAL WITH D) 500-200 MG-UNIT per tablet 2 tablet  2 tablet Oral Daily Loletha Grayer, MD   2 tablet at 02/11/16 1100  . diphenhydrAMINE (BENADRYL) capsule 25 mg  25 mg Oral 3 times per day Loletha Grayer, MD   25 mg at 02/11/16 0918  . famotidine (PEPCID) IVPB 20 mg premix  20 mg  Intravenous Q12H Loletha Grayer, MD      . folic acid (FOLVITE) tablet 1 mg  1 mg Oral Daily Loletha Grayer, MD   1 mg at 02/11/16 1100  . heparin injection 5,000 Units  5,000 Units Subcutaneous 3 times per day Loletha Grayer, MD   5,000 Units at 02/11/16 [phone]  . insulin aspart (novoLOG) injection 0-5 Units  0-5 Units Subcutaneous QHS Loletha Grayer, MD      . insulin aspart (novoLOG) injection 0-9 Units  0-9 Units Subcutaneous TID WC Loletha Grayer, MD   2 Units at 02/11/16 1246  . Linaclotide (LINZESS) capsule 290 mcg  290 mcg Oral Delsa Bern, MD   290 mcg at 02/11/16 1100  . methylPREDNISolone sodium succinate (SOLU-MEDROL) 125 mg/2 mL injection 60 mg  60 mg Intravenous 3 times per day Loletha Grayer, MD   60 mg at 02/11/16 0921  . OLANZapine (ZYPREXA) tablet 15 mg  15 mg Oral QHS Loletha Grayer, MD      . oxyCODONE (Oxy IR/ROXICODONE) immediate release tablet 5 mg  5 mg Oral Q4H PRN Loletha Grayer, MD          Allergies: Allergies  Allergen Reactions  . Ace Inhibitors Swelling    Angioedema (specifically from enalapril)  . Enalapril Swelling    angioedema  . Penicillins Hives and Swelling    Has patient had a PCN reaction causing immediate rash, facial/tongue/throat swelling, SOB or lightheadedness with hypotension: Yes Has patient had a PCN reaction causing severe rash involving mucus membranes or skin necrosis: No Has patient had a PCN reaction that required hospitalization Yes Has patient had a PCN reaction occurring within the last 10 years: No If all of the above answers are "NO", then may proceed with Cephalosporin use.      Past Medical History: Past Medical History  Diagnosis Date  . Irritable bowel syndrome   . RA (rheumatoid arthritis) ([place])   . Bipolar 1 disorder ([place])   . Insomnia   . GERD (gastroesophageal reflux disease)   . Diverticulosis   . Motion sickness     boats  . Hepatitis     B - "not active"  . Heart murmur     followed by PCP   . Diabetes mellitus without complication ([place])   . Depression      Past Surgical History: Past Surgical History  Procedure Laterality Date  . Tubal ligation    . Esophagogastroduodenoscopy (egd) with esophageal dilation    . Cholecystectomy  1997    [place], [place]  . Colonoscopy with  propofol N/A 09/07/2015    Procedure: COLONOSCOPY WITH PROPOFOL;  Surgeon: Lucilla Lame, MD;  Location: [place];  Service: Endoscopy;  Laterality: N/A;  . Esophagogastroduodenoscopy (egd) with propofol N/A 09/07/2015    Procedure: ESOPHAGOGASTRODUODENOSCOPY (EGD) WITH PROPOFOL;  Surgeon: Lucilla Lame, MD;  Location: [place];  Service: Endoscopy;  Laterality: N/A;  . Polypectomy N/A 09/07/2015    Procedure: POLYPECTOMY;  Surgeon: Lucilla Lame, MD;  Location: [place];  Service: Endoscopy;  Laterality: N/A;  . Partial colectomy N/A 01/24/2016    Procedure: SIGMOID COLECTOMY;  Surgeon: Florene Glen, MD;  Location: [facility];  Service: General;  Laterality: N/A;  . Bowel resection       Family History: Family History  Problem Relation Age of Onset  . Hypertension Mother   . Diabetes Mother   . Arthritis/Rheumatoid Mother   . Alcohol abuse Father   . Esophageal varices Father   . Diverticulosis Maternal Aunt   . Diverticulosis Maternal Grandmother      Social History: Social History   Social History  . Marital Status: Legally Separated    Spouse Name: N/A  . Number of Children: N/A  . Years of Education: N/A   Occupational History  . Not on file.   Social History Main Topics  . Smoking status: Light Tobacco Smoker -- 0.50 packs/day    Types: Cigarettes    Last Attempt to Quit: 05/06/2015  . Smokeless tobacco: Never Used  . Alcohol Use: No  . Drug Use: No  . Sexual Activity: Yes    Birth Control/ Protection: None   Other Topics Concern  . Not on file   Social History Narrative     Review of Systems: Gen: No fevers or chills or weight loss HEENT:  No problems with hearing or eyesight CV: No shortness of breath, palpitations Resp: No cough or sputum GI: Nausea and vomiting as described above GU : No problems with voiding MS: No acute complaints Derm:  No acute complaints Psych: No acute complaints Heme: No acute complaints Neuro: No acute complaints Endocrine no acute complaints  Vital Signs: Blood pressure 117/69, pulse 87, temperature 98.3 F (36.8 C), temperature source Oral, resp. rate 18, height 5\' 6"  (1.676 m), weight 87.181 kg (192 lb 3.2 oz), last menstrual period 07/09/2014, SpO2 99 %.   Intake/Output Summary (Last 24 hours) at 02/11/16 1449 Last data filed at 02/11/16 1245  Gross per 24 hour  Intake    144 ml  Output      0 ml  Net    144 ml    Weight trends: Filed Weights   02/11/16 0443 02/11/16 1258  Weight: 91.173 kg (201 lb) 87.181 kg (192 lb 3.2 oz)    Physical Exam: General:  no acute distress, laying in the bed   HEENT  anicteric, moist oral mucous membranes   Neck:  supple   Lungs:  normal breathing effort, clear to auscultation bilaterally   Heart::  no rub or gallop, soft systolic murmur present   Abdomen:  midline incision, staples intact except 1 which caused small open wound   Extremities:  no peripheral edema   Neurologic:  alert, oriented, speech normal   Skin:  no acute rashes   Access:   Foley:        Lab results: Basic Metabolic Panel:  Recent Labs Lab 02/11/16 0524 02/11/16 0752  NA 139 141  K 4.5 4.7  CL 110 112*  CO2 18* 18*  GLUCOSE 107*  141*  BUN 49* 45*  CREATININE 7.01* 6.08*  CALCIUM 7.9* 7.2*    Liver Function Tests:  Recent Labs Lab 02/11/16 0524  AST 9*  ALT 10*  ALKPHOS 75  BILITOT 0.5  PROT 7.2  ALBUMIN 3.3*   No results for input(s): LIPASE, AMYLASE in the last 168 hours. No results for input(s): AMMONIA in the last 168 hours.  CBC:  Recent Labs Lab 02/11/16 0524  WBC 7.8  NEUTROABS 4.8  HGB 12.1  HCT 35.9  MCV 88.9  PLT 372     Cardiac Enzymes: No results for input(s): CKTOTAL, TROPONINI in the last 168 hours.  BNP: Invalid input(s): POCBNP  CBG:  Recent Labs Lab 02/11/16 1201  GLUCAP 166*    Microbiology: Recent Results (from the past 720 hour(s))  Surgical pcr screen     Status: None   Collection Time: 01/16/16  9:07 AM  Result Value Ref Range Status   MRSA, PCR NEGATIVE NEGATIVE Final   Staphylococcus aureus NEGATIVE NEGATIVE Final    Comment:        The Xpert SA Assay (FDA approved for NASAL specimens in patients over 34 years of age), is one component of a comprehensive surveillance program.  Test performance has been validated by [facility] for patients greater than or equal to 26 year old. It is not intended to diagnose infection nor to guide or monitor treatment.   Urine culture     Status: None   Collection Time: 01/30/16 10:01 AM  Result Value Ref Range Status   Specimen Description URINE, CLEAN CATCH  Final   Special Requests NONE  Final   Culture MULTIPLE SPECIES PRESENT, SUGGEST RECOLLECTION  Final   Report Status 02/01/2016 FINAL  Final  Urine Culture     Status: None   Collection Time: 02/05/16 12:00 AM  Result Value Ref Range Status   Urine Culture, Routine Final report  Final   Urine Culture result 1 Comment  Final    Comment: Mixed urogenital flora Less than 10,000 colonies/mL      Coagulation Studies: No results for input(s): LABPROT, INR in the last 72 hours.  Urinalysis:  Recent Labs  02/11/16 0722  COLORURINE YELLOW*  LABSPEC 1.009  PHURINE 6.0  GLUCOSEU 50*  HGBUR 1+*  BILIRUBINUR NEGATIVE  KETONESUR NEGATIVE  PROTEINUR 100*  NITRITE NEGATIVE  LEUKOCYTESUR NEGATIVE        Imaging:  No results found.   Assessment & Plan: Pt is a 50 y.o. yo AA female medical problems of rheumatoid arthritis, treated with methotrexate and prednisone, diabetes, hypertension, recent surgery sigmoid colectomy on 01/24/2016, for recurrent diverticulitis  who was admitted to [facility] on 02/11/2016 for evaluation of intractable nausea and vomiting that started 1 day prior to admission.   1. Acute renal failure. Baseline creatinine appears to be 0.65 from February 17 Admission creatinine is 7.01 Repeat creatinine this morning was 6.08 Cause of acute renal failure is likely multifactorial with possibly prerenal state progressing to ATN in the setting of taking multiple medications including methotrexate, meloxicam, Glucophage and ACE inhibitor Plan: Gentle IV hydration We'll obtain renal ultrasound Avoid nephrotoxic agents, IV contrast, nonsteroidals Electrolytes and volume status are acceptable No acute indication for dialysis at present

## 2016-02-11 NOTE — ED Notes (Signed)
Patient reports noticed some swelling to her face on Wednesday.  Tonight states woke up to go to the bathroom and felt like she could not swallow.  Patient takes an ace inhibitor for blood pressure.  Patient with noted swelling to her upper lip.

## 2016-02-11 NOTE — H&P (Signed)
[place] [facility] [place] NAME: Elizabeth [name]    MR#:  [id number]  DATE OF BIRTH:  Jul 23, 1966  DATE OF ADMISSION:  02/11/2016  PRIMARY CARE PHYSICIAN: Bobetta Lime, MD   REQUESTING/REFERRING PHYSICIAN: Dr. Georgina Snell for back  CHIEF COMPLAINT:   Chief Complaint  Patient presents with  . Facial Swelling    HISTORY OF PRESENT ILLNESS:  Elizabeth [name]  is a 50 y.o. female with a known history of rheumatoid arthritis, diabetes, hypertension. She recently had a surgery for a colon resection for diverticulosis. Since the surgery her wound opened up and had some drainage. She started back on her methotrexate and her medications. For the last few days she's been having facial swelling. It started as lip swelling but then progressed to facial and tongue swelling. She is on enalapril as outpatient. She also had some nausea vomiting since yesterday. In the ER she was found to have an angioedema and also acute renal failure and hospitalist services were contacted for further evaluation.  PAST MEDICAL HISTORY:   Past Medical History  Diagnosis Date  . Irritable bowel syndrome   . RA (rheumatoid arthritis) ([place])   . Bipolar 1 disorder ([place])   . Insomnia   . GERD (gastroesophageal reflux disease)   . Diverticulosis   . Motion sickness     boats  . Hepatitis     B - "not active"  . Heart murmur     followed by PCP  . Diabetes mellitus without complication ([place])   . Depression     PAST SURGICAL HISTORY:   Past Surgical History  Procedure Laterality Date  . Tubal ligation    . Esophagogastroduodenoscopy (egd) with esophageal dilation    . Cholecystectomy  1997    [place], [place]  . Colonoscopy with propofol N/A 09/07/2015    Procedure: COLONOSCOPY WITH PROPOFOL;  Surgeon: Lucilla Lame, MD;  Location: [place];  Service: Endoscopy;  Laterality: N/A;  . Esophagogastroduodenoscopy (egd) with propofol N/A 09/07/2015    Procedure:  ESOPHAGOGASTRODUODENOSCOPY (EGD) WITH PROPOFOL;  Surgeon: Lucilla Lame, MD;  Location: [place];  Service: Endoscopy;  Laterality: N/A;  . Polypectomy N/A 09/07/2015    Procedure: POLYPECTOMY;  Surgeon: Lucilla Lame, MD;  Location: [place];  Service: Endoscopy;  Laterality: N/A;  . Partial colectomy N/A 01/24/2016    Procedure: SIGMOID COLECTOMY;  Surgeon: Florene Glen, MD;  Location: [facility];  Service: General;  Laterality: N/A;  . Bowel resection      SOCIAL HISTORY:   Social History  Substance Use Topics  . Smoking status: Light Tobacco Smoker -- 0.50 packs/day    Types: Cigarettes    Last Attempt to Quit: 05/06/2015  . Smokeless tobacco: Never Used  . Alcohol Use: No    FAMILY HISTORY:   Family History  Problem Relation Age of Onset  . Hypertension Mother   . Diabetes Mother   . Arthritis/Rheumatoid Mother   . Alcohol abuse Father   . Esophageal varices Father   . Diverticulosis Maternal Aunt   . Diverticulosis Maternal Grandmother     DRUG ALLERGIES:   Allergies  Allergen Reactions  . Ace Inhibitors Swelling    Angioedema (specifically from enalapril)  . Enalapril Swelling    angioedema  . Penicillins Hives and Swelling    Has patient had a PCN reaction causing immediate rash, facial/tongue/throat swelling, SOB or lightheadedness with hypotension: Yes Has patient had a PCN reaction causing severe rash involving  mucus membranes or skin necrosis: No Has patient had a PCN reaction that required hospitalization Yes Has patient had a PCN reaction occurring within the last 10 years: No If all of the above answers are "NO", then may proceed with Cephalosporin use.    REVIEW OF SYSTEMS:  CONSTITUTIONAL: Cold feeling and hot feeling. Positive for fatigue with sleeping a lot. EYES: No blurred or double vision.  EARS, NOSE, AND THROAT: No tinnitus or ear pain. Positive for sore throat and dysphagia. Positive for runny nose. RESPIRATORY: Positive  for cough and shortness of breath, no wheezing or hemoptysis.  CARDIOVASCULAR: No chest pain, orthopnea, edema.  GASTROINTESTINAL: Some nausea and vomiting. Some abdominal pain. No blood in bowel movements GENITOURINARY: No dysuria, hematuria.  ENDOCRINE: No polyuria, nocturia,  HEMATOLOGY: No anemia, easy bruising or bleeding SKIN: No rash or lesion. MUSCULOSKELETAL: Positive for her usual joint pains with her arthritis.   NEUROLOGIC: No tingling, numbness, weakness.  PSYCHIATRY: History of depression.   MEDICATIONS AT HOME:   Prior to Admission medications   Medication Sig Start Date End Date Taking? Authorizing Provider  aspirin EC 81 MG tablet Take 81 mg by mouth daily.   Yes Historical Provider, MD  B-D INS SYR ULTRAFINE 1CC/31G 31G X 5/16" 1 ML MISC use to inject METHOTREXATE 05/25/15  Yes Historical Provider, MD  Calcium Carbonate-Vitamin D (CALCIUM-VITAMIN D) 500-200 MG-UNIT tablet Take 2 tablets by mouth daily.    Yes Historical Provider, MD  folic acid (FOLVITE) 1 MG tablet Take 1 mg by mouth daily. 04/12/15  Yes Historical Provider, MD  glucose blood (ACCU-CHEK SMARTVIEW) test strip Use as instructed 11/07/15  Yes Bobetta Lime, MD  Hydrocodone-Acetaminophen (VICODIN) 5-300 MG TABS Take 1 tablet by mouth every 4 (four) hours as needed. 01/27/16  Yes Florene Glen, MD  Lancets (ACCU-CHEK SOFT [facility]) lancets 1 each by Other route 3 (three) times daily. 11/10/15  Yes Bobetta Lime, MD  lansoprazole (PREVACID) 30 MG capsule take 1 capsule by mouth once daily 30 MINUTES PRIOR TO BREAKFAST 05/17/15  Yes Historical Provider, MD  linagliptin (TRADJENTA) 5 MG TABS tablet Take 1 tablet (5 mg total) by mouth daily. 01/17/16  Yes Bobetta Lime, MD  LINZESS 290 MCG CAPS capsule Take 290 mcg by mouth every morning. 04/12/15  Yes Historical Provider, MD  meloxicam (MOBIC) 15 MG tablet Take 15 mg by mouth daily. 09/18/15  Yes Historical Provider, MD  metFORMIN (GLUCOPHAGE) 1000 MG tablet Take 1  tablet (1,000 mg total) by mouth 2 (two) times daily with a meal. 10/23/15  Yes Bobetta Lime, MD  methotrexate 50 MG/2ML injection inject 0.6 milliliters subcutaneously every week on Wednesday. 10/12/15  Yes Historical Provider, MD  OLANZapine (ZYPREXA) 15 MG tablet Take 15 mg by mouth at bedtime as needed (for bipolar.).  05/16/15  Yes Historical Provider, MD  traZODone (DESYREL) 100 MG tablet Take 100 mg by mouth at bedtime as needed for sleep.    Yes Historical Provider, MD      VITAL SIGNS:  Blood pressure 117/69, pulse 87, temperature 98.3 F (36.8 C), temperature source Oral, resp. rate 18, height 5\' 6"  (1.676 m), weight 87.181 kg (192 lb 3.2 oz), last menstrual period 07/09/2014, SpO2 99 %.  PHYSICAL EXAMINATION:  GENERAL:  50 y.o.-year-old patient lying in the bed with no acute distress.  EYES: Pupils equal, round, reactive to light and accommodation. No scleral icterus. Extraocular muscles intact.  HEENT: Head atraumatic. Oropharynx and nasopharynx clear. Facial and neck swelling. Lip swelling now  seems better than it was in the picture yesterday. Right side of her tongue is swollen NECK:  Supple, no jugular venous distention. No thyroid enlargement. Positive for neck swelling LUNGS: Normal breath sounds bilaterally, no wheezing, rales,rhonchi or crepitation. No use of accessory muscles of respiration.  CARDIOVASCULAR: S1, S2 normal. No murmurs, rubs, or gallops.  ABDOMEN: Soft, nontender, nondistended. Bowel sounds present. No organomegaly or mass.  EXTREMITIES: No pedal edema, cyanosis, or clubbing.  NEUROLOGIC: Cranial nerves II through XII are intact. Muscle strength 5/5 in all extremities. Sensation intact. Gait not checked.  PSYCHIATRIC: The patient is alert and oriented x 3.  SKIN: Staples still on abdomen with the lower part of her wound opened up. No signs of infection   LABORATORY PANEL:   CBC  Recent Labs Lab 02/11/16 0524  WBC 7.8  HGB 12.1  HCT 35.9  PLT 372    ------------------------------------------------------------------------------------------------------------------  Chemistries   Recent Labs Lab 02/11/16 0524 02/11/16 0752  NA 139 141  K 4.5 4.7  CL 110 112*  CO2 18* 18*  GLUCOSE 107* 141*  BUN 49* 45*  CREATININE 7.01* 6.08*  CALCIUM 7.9* 7.2*  AST 9*  --   ALT 10*  --   ALKPHOS 75  --   BILITOT 0.5  --    ------------------------------------------------------------------------------------------------------------------  -----------------------------------------------------------------------------------------------    IMPRESSION AND PLAN:   1. Acute renal failure, likely ATN. First creatinine elevated 7.01. Second creatinine 6.08 after 1 L of fluid. GFR at this point is very poor. Will get a nephrology consultation and continue IV fluid hydration. Stop Glucophage, enalapril, mobic and any other medications that can affect the kidney function. 2. Angioedema secondary to ACE inhibitor. Stop enalapril. This was listed as a drug allergy. Patient will avoid ace inhibitors and ARB's now and in the future. Give IV Solu-Medrol Benadryl and Pepcid. 3. Type 2 diabetes mellitus- hold oral medications and put on sliding scale for now 4. Essential hypertension- blood pressure stable hold current medications 5. Rheumatoid arthritis and osteoarthritis- hold methotrexate for right now 6. Recent colon resection. Patient still has staples in her abdomen and an opening up of her wound in the lower abdomen. We'll put in a routine general surgery consultation. Her sutures were due to be removed soon anyway. No signs of infection on the abdomen.  All the records are reviewed and case discussed with ED provider. Management plans discussed with the patient, family and they are in agreement.  CODE STATUS: Full code  TOTAL TIME TAKING CARE OF THIS PATIENT: 50 minutes.    Loletha Grayer M.D on 02/11/2016 at 2:39 PM, patient seen this morning  and note written this afternoon.  Between 7am to 6pm - Pager - [phone]  After 6pm call admission pager [facility]  Office  [phone]  CC: Primary care physician; Bobetta Lime, MD

## 2016-02-11 NOTE — ED Notes (Signed)
Lab verified CMP number on rerun, Dr Karma Greaser notified, orders recieved

## 2016-02-11 NOTE — ED Notes (Signed)
LAB CALLED, Nevin Bloodgood, to ask if CMP results are correct and if they are critical to report.  Negative on reporting.  Results are grossly unexpected, lab will rerun results prior to redraw.  Dr Karma Greaser and pt/pt ffamily apprised

## 2016-02-12 ENCOUNTER — Encounter: Payer: Medicare Other | Admitting: Surgery

## 2016-02-12 ENCOUNTER — Inpatient Hospital Stay: Payer: Medicare Other

## 2016-02-12 LAB — BASIC METABOLIC PANEL
ANION GAP: 9 (ref 5–15)
BUN: 55 mg/dL — ABNORMAL HIGH (ref 6–20)
CHLORIDE: 113 mmol/L — AB (ref 101–111)
CO2: 18 mmol/L — AB (ref 22–32)
Calcium: 8.3 mg/dL — ABNORMAL LOW (ref 8.9–10.3)
Creatinine, Ser: 5.92 mg/dL — ABNORMAL HIGH (ref 0.44–1.00)
GFR calc non Af Amer: 8 mL/min — ABNORMAL LOW (ref >60)
GFR, EST AFRICAN AMERICAN: 9 mL/min — AB (ref >60)
Glucose, Bld: 152 mg/dL — ABNORMAL HIGH (ref 65–99)
Potassium: 5.5 mmol/L — ABNORMAL HIGH (ref 3.5–5.1)
SODIUM: 140 mmol/L (ref 135–145)

## 2016-02-12 LAB — CBC
HCT: 33.9 % — ABNORMAL LOW (ref 35.0–47.0)
HEMOGLOBIN: 11.6 g/dL — AB (ref 12.0–16.0)
MCH: 30.3 pg (ref 26.0–34.0)
MCHC: 34.3 g/dL (ref 32.0–36.0)
MCV: 88.4 fL (ref 80.0–100.0)
PLATELETS: 365 10*3/uL (ref 150–440)
RBC: 3.84 MIL/uL (ref 3.80–5.20)
RDW: 14.8 % — ABNORMAL HIGH (ref 11.5–14.5)
WBC: 12.8 10*3/uL — AB (ref 3.6–11.0)

## 2016-02-12 LAB — GLUCOSE, CAPILLARY
GLUCOSE-CAPILLARY: 132 mg/dL — AB (ref 65–99)
GLUCOSE-CAPILLARY: 190 mg/dL — AB (ref 65–99)
Glucose-Capillary: 235 mg/dL — ABNORMAL HIGH (ref 65–99)
Glucose-Capillary: 195 mg/dL — ABNORMAL HIGH (ref 65–99)

## 2016-02-12 MED ORDER — PREDNISONE 10 MG PO TABS
30.0000 mg | ORAL_TABLET | Freq: Every day | ORAL | Status: DC
Start: 2016-02-12 — End: 2016-02-13
  Administered 2016-02-12 – 2016-02-13 (×2): 30 mg via ORAL
  Filled 2016-02-12 (×3): qty 1

## 2016-02-12 MED ORDER — TRAZODONE HCL 100 MG PO TABS
100.0000 mg | ORAL_TABLET | Freq: Every day | ORAL | Status: DC
  Administered 2016-02-12: 100 mg via ORAL
  Filled 2016-02-12: qty 1

## 2016-02-12 NOTE — Progress Notes (Signed)
[facility]  ROUNDING NOTE   Subjective:   Swelling improved. Husband at bedside.  Creatinine 5.92 (6.08)  Potassium 5.5  Objective:  Vital signs in last 24 hours:  Temp:  [97.4 F (36.3 C)-98.4 F (36.9 C)] 97.9 F (36.6 C) (03/06 1233) Pulse Rate:  [72-89] 89 (03/06 1233) Resp:  [14-18] 16 (03/06 1233) BP: (122-147)/(72-75) 131/74 mmHg (03/06 1233) SpO2:  [99 %-100 %] 100 % (03/06 1233)  Weight change: -3.992 kg (-8 lb 12.8 oz) Filed Weights   02/11/16 0443 02/11/16 1258  Weight: 91.173 kg (201 lb) 87.181 kg (192 lb 3.2 oz)    Intake/Output: I/O last 3 completed shifts: In: 3012 [P.O.:700; I.V.:2312] Out: 1850 [Urine:1850]   Intake/Output this shift:  Total I/O In: 592 [P.O.:50; I.V.:442; IV Piggyback:100] Out: 400 [Urine:400]  Physical Exam: General: NAD,   Head: Normocephalic, atraumatic. Moist oral mucosal membranes  Eyes: Anicteric, PERRL  Neck: Supple, trachea midline  Lungs:  Clear to auscultation  Heart: Regular rate and rhythm  Abdomen:  Soft, nontender,   Extremities: no peripheral edema.  Neurologic: Nonfocal, moving all four extremities  Skin: No lesions       Basic Metabolic Panel:  Recent Labs Lab 02/11/16 0524 02/11/16 0752 02/12/16 0544  NA 139 141 140  K 4.5 4.7 5.5*  CL 110 112* 113*  CO2 18* 18* 18*  GLUCOSE 107* 141* 152*  BUN 49* 45* 55*  CREATININE 7.01* 6.08* 5.92*  CALCIUM 7.9* 7.2* 8.3*    Liver Function Tests:  Recent Labs Lab 02/11/16 0524  AST 9*  ALT 10*  ALKPHOS 75  BILITOT 0.5  PROT 7.2  ALBUMIN 3.3*   No results for input(s): LIPASE, AMYLASE in the last 168 hours. No results for input(s): AMMONIA in the last 168 hours.  CBC:  Recent Labs Lab 02/11/16 0524 02/12/16 0544  WBC 7.8 12.8*  NEUTROABS 4.8  --   HGB 12.1 11.6*  HCT 35.9 33.9*  MCV 88.9 88.4  PLT 372 365    Cardiac Enzymes: No results for input(s): CKTOTAL, CKMB, CKMBINDEX, TROPONINI in the last 168  hours.  BNP: Invalid input(s): POCBNP  CBG:  Recent Labs Lab 02/11/16 1201 02/11/16 1644 02/11/16 2201 02/12/16 0749 02/12/16 1135  GLUCAP 166* 195* 135* 132* 235*    Microbiology: Results for orders placed or performed in visit on 02/05/16  Urine Culture     Status: None   Collection Time: 02/05/16 12:00 AM  Result Value Ref Range Status   Urine Culture, Routine Final report  Final   Urine Culture result 1 Comment  Final    Comment: Mixed urogenital flora Less than 10,000 colonies/mL     Coagulation Studies: No results for input(s): LABPROT, INR in the last 72 hours.  Urinalysis:  Recent Labs  02/11/16 0722  COLORURINE YELLOW*  LABSPEC 1.009  PHURINE 6.0  GLUCOSEU 50*  HGBUR 1+*  BILIRUBINUR NEGATIVE  KETONESUR NEGATIVE  PROTEINUR 100*  NITRITE NEGATIVE  LEUKOCYTESUR NEGATIVE      Imaging: US Renal  02/12/2016  CLINICAL DATA:  Acute renal failure. EXAM: RENAL / URINARY TRACT ULTRASOUND COMPLETE COMPARISON:  None. FINDINGS: Right Kidney: Length: 13.0 cm. Echogenicity within normal limits. No mass or hydronephrosis visualized. Left Kidney: Length: 12.2 cm. Echogenicity within normal limits. No size mass or hydronephrosis visualized. 1.3 x 1.8 x 1.4 cm simple cyst left mid kidney. Bladder: Appears normal for degree of bladder distention. IMPRESSION: 1.8 cm simple cyst left mid kidney.  Exam otherwise unremarkable. Electronically Signed  By: [place]   On: 02/12/2016 10:36     Medications:   . sodium chloride 100 mL/hr at 02/12/16 [id number]   . aspirin EC  81 mg Oral Daily  . calcium-vitamin D  2 tablet Oral Daily  . diphenhydrAMINE  25 mg Oral 3 times per day  . famotidine (PEPCID) IV  20 mg Intravenous Q12H  . folic acid  1 mg Oral Daily  . heparin  5,000 Units Subcutaneous 3 times per day  . insulin aspart  0-5 Units Subcutaneous QHS  . insulin aspart  0-9 Units Subcutaneous TID WC  . Linaclotide  290 mcg Oral BH-q7a  . OLANZapine  15 mg Oral  QHS  . predniSONE  30 mg Oral Q breakfast   acetaminophen **OR** acetaminophen, oxyCODONE  Assessment/ Plan:  Ms. NKIRUKA SAMAROO is a 50 y.o. black female with rheumatoid arthritis treated with methotrexate and prednisone, diabetes mellitus type II, hypertension, status post sigmoid colectomy on 01/24/2016, for recurrent diverticulitis who was admitted to [facility] on 02/11/2016 for Dysphagia [R13.10] Angioedema, initial encounter [T78.3XXA] Acute renal failure, unspecified acute renal failure type ([place]) [N17.9]  1. Acute renal failure: Baseline creatinine  0.65 from 01/26/16. Admission creatinine is 7.01 Cause of acute renal failure is likely due to renal artery stenosis exacerbated by enalapril.  However could also be multifactorial with possibly prerenal state progressing to ATN in the setting of taking multiple medications including methotrexate, meloxicam, metformin and enalapril.  - Renal ultrasound reviewed with patient. Will need outpatient renal dopplers.  - Continue IV fluids  2. Hypertension: diastolic elevations. Home regimen of only enalapril.   3. Hyperkalemia: with acute renal failure from enalapril is suspicious for renal artery stenosis. - monitor closely.   4. Diabetes Mellitus type II with renal manifestations: on metformin as outpatient. Urinalysis with proteinuria and hematuria. Will need outpatient follow up.    LOS: 1 Cielle Aguila 3/6/20172:55 PM

## 2016-02-12 NOTE — Progress Notes (Signed)
Patient ID: Elizabeth [name], female   DOB: Oct 06, 1966, 50 y.o.   MRN: [id number] [facility] PROGRESS NOTE  ABELINA WARNELL [id number] DOB: December 18, 1965 DOA: 02/11/2016 PCP: Bobetta Lime, MD  HPI/Subjective: Patient feeling much better with regards to facial and tongue swelling and lip swelling. No problems breathing or swallowing at this time. She is urinating well. Feels a lot better than yesterday.  Objective: Filed Vitals:   02/12/16 1036 02/12/16 1233  BP: 147/75 131/74  Pulse: 89 89  Temp: 97.4 F (36.3 C) 97.9 F (36.6 C)  Resp: 14 16    Filed Weights   02/11/16 0443 02/11/16 1258  Weight: 91.173 kg (201 lb) 87.181 kg (192 lb 3.2 oz)    ROS: Review of Systems  Constitutional: Negative for fever and chills.  Eyes: Negative for blurred vision.  Respiratory: Negative for cough and shortness of breath.   Cardiovascular: Negative for chest pain.  Gastrointestinal: Negative for nausea, vomiting, abdominal pain, diarrhea and constipation.  Genitourinary: Negative for dysuria.  Musculoskeletal: Negative for joint pain.  Neurological: Negative for dizziness and headaches.   Exam: Physical Exam  Constitutional: She is oriented to person, place, and time.  HENT:  Nose: No mucosal edema.  Mouth/Throat: No oropharyngeal exudate or posterior oropharyngeal edema.  Tongue is back to normal size. Lips are back to normal size.  Eyes: Conjunctivae, EOM and lids are normal. Pupils are equal, round, and reactive to light.  Neck: No JVD present. Carotid bruit is not present. No thyroid mass and no thyromegaly present.  Some swelling in the neck but less than previous  Cardiovascular: S1 normal and S2 normal.  Exam reveals no gallop.   No murmur heard. Pulses:      Dorsalis pedis pulses are 2+ on the right side, and 2+ on the left side.  Respiratory: No respiratory distress. She has no wheezes. She has no rhonchi. She has no rales.  GI: Soft. Bowel sounds are normal. There  is no tenderness.  Musculoskeletal:       Right ankle: She exhibits no swelling.       Left ankle: She exhibits no swelling.  Lymphadenopathy:    She has no cervical adenopathy.  Neurological: She is alert and oriented to person, place, and time. No cranial nerve deficit.  Skin: Skin is warm. No rash noted. Nails show no clubbing.  Psychiatric: She has a normal mood and affect.      Data Reviewed: Basic Metabolic Panel:  Recent Labs Lab 02/11/16 0524 02/11/16 0752 02/12/16 0544  NA 139 141 140  K 4.5 4.7 5.5*  CL 110 112* 113*  CO2 18* 18* 18*  GLUCOSE 107* 141* 152*  BUN 49* 45* 55*  CREATININE 7.01* 6.08* 5.92*  CALCIUM 7.9* 7.2* 8.3*   Liver Function Tests:  Recent Labs Lab 02/11/16 0524  AST 9*  ALT 10*  ALKPHOS 75  BILITOT 0.5  PROT 7.2  ALBUMIN 3.3*  CBC:  Recent Labs Lab 02/11/16 0524 02/12/16 0544  WBC 7.8 12.8*  NEUTROABS 4.8  --   HGB 12.1 11.6*  HCT 35.9 33.9*  MCV 88.9 88.4  PLT 372 365   CBG:  Recent Labs Lab 02/11/16 1201 02/11/16 1644 02/11/16 2201 02/12/16 0749 02/12/16 1135  GLUCAP 166* 195* 135* 132* 235*      Studies: US Renal  02/12/2016  CLINICAL DATA:  Acute renal failure. EXAM: RENAL / URINARY TRACT ULTRASOUND COMPLETE COMPARISON:  None. FINDINGS: Right Kidney: Length: 13.0 cm. Echogenicity within normal  limits. No mass or hydronephrosis visualized. Left Kidney: Length: 12.2 cm. Echogenicity within normal limits. No size mass or hydronephrosis visualized. 1.3 x 1.8 x 1.4 cm simple cyst left mid kidney. Bladder: Appears normal for degree of bladder distention. IMPRESSION: 1.8 cm simple cyst left mid kidney.  Exam otherwise unremarkable. Electronically Signed   By: Marcello Moores  Register   On: 02/12/2016 10:36    Scheduled Meds: . aspirin EC  81 mg Oral Daily  . calcium-vitamin D  2 tablet Oral Daily  . diphenhydrAMINE  25 mg Oral 3 times per day  . famotidine (PEPCID) IV  20 mg Intravenous Q12H  . folic acid  1 mg Oral Daily   . heparin  5,000 Units Subcutaneous 3 times per day  . insulin aspart  0-5 Units Subcutaneous QHS  . insulin aspart  0-9 Units Subcutaneous TID WC  . Linaclotide  290 mcg Oral BH-q7a  . OLANZapine  15 mg Oral QHS  . predniSONE  30 mg Oral Q breakfast   Continuous Infusions: . sodium chloride 100 mL/hr at 02/12/16 [id number]    Assessment/Plan:  1. Acute renal failure unspecified. Likely a combination of nausea vomiting and taking medications including enalapril, Mobic, Glucophage and methotrexate. Creatinine has improved from 7 upon admission down to 5.92. We'll continue to watch the trend today and tomorrow. Continue IV fluids. Appreciate nephrology consultation. Renal sonogram essentially negative just shows a renal cyst. 2. Hyperkalemia- likely secondary to acute renal failure. Monitor again tomorrow. 3. Angioedema initial encounter. This has greatly improved. Change Solu-Medrol over to prednisone taper. Continue as needed Benadryl and Pepcid 4. Essential hypertension- hold blood pressure medications 5. Type 2 diabetes mellitus- patient on sliding scale at this point. Sugars are acceptable even though the patient is on steroids. 6. Rheumatoid arthritis hold methotrexate 7. History of bipolar disorder and depression on Zyprexa  Code Status:     Code Status Orders        Start     Ordered   02/11/16 0837  Full code   Continuous     02/11/16 0836    Code Status History    Date Active Date Inactive Code Status Order ID Comments User Context   01/24/2016  9:36 AM 01/27/2016  1:58 PM Full Code [id number]  Florene Glen, MD Inpatient   05/07/2015  1:26 PM 05/10/2015  3:07 PM Full Code [id number]  Sherri Rad, MD Inpatient     Family Communication: Family at bedside Disposition Plan: Home soon. Would like to see creatinine improved with bit more prior to discharge home.  Consultants:  Nephrology  Time spent: 25 minutes  Loletha Grayer  [facility]  Hospitalists

## 2016-02-12 NOTE — Progress Notes (Signed)
Initial Nutrition Assessment    INTERVENTION:  Meals and snacks: Cater to pt preferences   NUTRITION DIAGNOSIS:   Inadequate oral intake related to acute illness as evidenced by per patient/family report.    GOAL:   Patient will meet greater than or equal to 90% of their needs    MONITOR:    (Energy intake)  REASON FOR ASSESSMENT:   Diagnosis    ASSESSMENT:      Pt admitted with facial and tongue swelling, ARF, hyperkalemia.  Past Medical History  Diagnosis Date  . Irritable bowel syndrome   . RA (rheumatoid arthritis) ([place])   . Bipolar 1 disorder ([place])   . Insomnia   . GERD (gastroesophageal reflux disease)   . Diverticulosis   . Motion sickness     boats  . Hepatitis     B - "not active"  . Heart murmur     followed by PCP  . Diabetes mellitus without complication ([place])   . Depression     Current Nutrition: hungry wanting to eat per pt.  "My appetite has returned."  Food/Nutrition-Related History: Pt reports for about 5 days prior to admission, had nausea, vomiting, facial and tongue swelling that prevented pt from eating normally   Scheduled Medications:  . aspirin EC  81 mg Oral Daily  . calcium-vitamin D  2 tablet Oral Daily  . diphenhydrAMINE  25 mg Oral 3 times per day  . famotidine (PEPCID) IV  20 mg Intravenous Q12H  . folic acid  1 mg Oral Daily  . heparin  5,000 Units Subcutaneous 3 times per day  . insulin aspart  0-5 Units Subcutaneous QHS  . insulin aspart  0-9 Units Subcutaneous TID WC  . Linaclotide  290 mcg Oral BH-q7a  . OLANZapine  15 mg Oral QHS  . predniSONE  30 mg Oral Q breakfast    Continuous Medications:  . sodium chloride 100 mL/hr at 02/12/16 0642     Electrolyte/Renal Profile and Glucose Profile:   Recent Labs Lab 02/11/16 0524 02/11/16 0752 02/12/16 0544  NA 139 141 140  K 4.5 4.7 5.5*  CL 110 112* 113*  CO2 18* 18* 18*  BUN 49* 45* 55*  CREATININE 7.01* 6.08* 5.92*  CALCIUM 7.9* 7.2* 8.3*  GLUCOSE  107* 141* 152*   Protein Profile:  Recent Labs Lab 02/11/16 0524  ALBUMIN 3.3*    Gastrointestinal Profile: Last BM: 3/4    Weight Change: stable wt per pt    Diet Order:  Diet heart healthy/carb modified Room service appropriate?: Yes; Fluid consistency:: Thin  Skin:   reviewed   Height:   Ht Readings from Last 1 Encounters:  02/11/16 5\' 6"  (1.676 m)    Weight:   Wt Readings from Last 1 Encounters:  02/11/16 192 lb 3.2 oz (87.181 kg)    Ideal Body Weight:     BMI:  Body mass index is 31.04 kg/(m^2).      EDUCATION NEEDS:   No education needs identified at this time  LOW Care Level  Elizabeth [name], [place], [place] (pager) Weekend/On-Call pager [phone])

## 2016-02-13 LAB — BASIC METABOLIC PANEL
Anion gap: 4 — ABNORMAL LOW (ref 5–15)
BUN: 55 mg/dL — AB (ref 6–20)
CHLORIDE: 118 mmol/L — AB (ref 101–111)
CO2: 21 mmol/L — AB (ref 22–32)
Calcium: 9.1 mg/dL (ref 8.9–10.3)
Creatinine, Ser: 3.04 mg/dL — ABNORMAL HIGH (ref 0.44–1.00)
GFR calc Af Amer: 20 mL/min — ABNORMAL LOW (ref >60)
GFR calc non Af Amer: 17 mL/min — ABNORMAL LOW (ref >60)
GLUCOSE: 117 mg/dL — AB (ref 65–99)
POTASSIUM: 5.1 mmol/L (ref 3.5–5.1)
Sodium: 143 mmol/L (ref 135–145)

## 2016-02-13 LAB — GLUCOSE, CAPILLARY: Glucose-Capillary: 99 mg/dL (ref 65–99)

## 2016-02-13 MED ORDER — FAMOTIDINE 20 MG PO TABS
20.0000 mg | ORAL_TABLET | Freq: Two times a day (BID) | ORAL | Status: DC
  Administered 2016-02-13: 20 mg via ORAL
  Filled 2016-02-13: qty 1

## 2016-02-13 MED ORDER — PREDNISONE 5 MG PO TABS: ORAL_TABLET | ORAL | Status: DC

## 2016-02-13 MED ORDER — DIPHENHYDRAMINE HCL 25 MG PO CAPS: 25.0000 mg | ORAL_CAPSULE | Freq: Three times a day (TID) | ORAL | Status: DC | PRN

## 2016-02-13 NOTE — Progress Notes (Signed)
Key Points: Use following P&T approved IV to PO antibiotic change policy.  Description contains the criteria that are approved Note: Policy Excludes:  Esophagectomy patientsPHARMACIST - PHYSICIAN COMMUNICATION DR:   Leslye Peer CONCERNING: IV to Oral Route Change Policy  RECOMMENDATION: This patient is receiving famotidine by the intravenous route.  Based on criteria approved by the Pharmacy and Therapeutics Committee, the intravenous medication(s) is/are being converted to the equivalent oral dose form(s).   DESCRIPTION: These criteria include:  The patient is eating (either orally or via tube) and/or has been taking other orally administered medications for a least 24 hours  The patient has no evidence of active gastrointestinal bleeding or impaired GI absorption (gastrectomy, short bowel, patient on TNA or NPO).  If you have questions about this conversion, please contact the Pharmacy Department  []   [phone] )  [facility] [x]   [phone] )  [facility] []   [phone] )  [facility] []   [phone] )  [facility] []   [phone] )  [place], [facility] 02/13/2016 9:00 AM

## 2016-02-13 NOTE — Discharge Summary (Signed)
[facility] NAME: Elizabeth [name]    MR#:  [id number]  DATE OF BIRTH:  Dec 07, 1966  DATE OF ADMISSION:  02/11/2016 ADMITTING PHYSICIAN: Loletha Grayer, MD  DATE OF DISCHARGE: 02/13/2016 10:32 AM  PRIMARY CARE PHYSICIAN: Bobetta Lime, MD    ADMISSION DIAGNOSIS:  Dysphagia [R13.10] Angioedema, initial encounter [T78.3XXA] Acute renal failure, unspecified acute renal failure type ([place]) [N17.9]  DISCHARGE DIAGNOSIS:  Active Problems:   Acute renal failure (ARF) ([place])   SECONDARY DIAGNOSIS:   Past Medical History  Diagnosis Date  . Irritable bowel syndrome   . RA (rheumatoid arthritis) ([place])   . Bipolar 1 disorder ([place])   . Insomnia   . GERD (gastroesophageal reflux disease)   . Diverticulosis   . Motion sickness     boats  . Hepatitis     B - "not active"  . Heart murmur     followed by PCP  . Diabetes mellitus without complication ([place])   . Depression     HOSPITAL COURSE:   1. Acute renal failure unspecified. Likely ATN with nausea vomiting and not eating and taking medications including enalapril, Mobic, methotrexate and Glucophage. Creatinine on presentation was 7. Upon discharge it was 3.04. It had improved quite a bit and patient was discharged home in stable condition. She will follow-up with nephrology as outpatient for a recheck blood work. 2. Hyperkalemia likely with acute renal failure. 3. Acute angioedema secondary to Ace inhibitors. Patient was stopped on her enalapril. I advised her never to take an ACE inhibitor or a ARB class of medications. She was given Solu-Medrol, Benadryl and Pepcid. The swelling on her tongue face and lips had improved back to normal. She will get a quick prednisone taper. I advised her that symptoms can last up to 14 days after stopping the medication. 4. Type 2 diabetes mellitus- I held her oral medications. Contraindicated with acute renal failure. Sugars have been good even  though she was on steroids. 5. Essential hypertension. Blood pressure good off medication 6. Bipolar disorder and depression on Zyprexa 7. Rheumatoid arthritis hold methotrexate and follow-up with rheumatologist  DISCHARGE CONDITIONS:   Satisfactory  CONSULTS OBTAINED:  Treatment Team:  Christene Lye, MD Murlean Iba, MD Hubbard Robinson, MD  DRUG ALLERGIES:   Allergies  Allergen Reactions  . Ace Inhibitors Swelling    Angioedema (specifically from enalapril)  . Enalapril Swelling    angioedema  . Penicillins Hives and Swelling    Has patient had a PCN reaction causing immediate rash, facial/tongue/throat swelling, SOB or lightheadedness with hypotension: Yes Has patient had a PCN reaction causing severe rash involving mucus membranes or skin necrosis: No Has patient had a PCN reaction that required hospitalization Yes Has patient had a PCN reaction occurring within the last 10 years: No If all of the above answers are "NO", then may proceed with Cephalosporin use.    DISCHARGE MEDICATIONS:   Discharge Medication List as of 02/13/2016 10:18 AM    START taking these medications   Details  diphenhydrAMINE (BENADRYL) 25 mg capsule Take 1 capsule (25 mg total) by mouth every 8 (eight) hours as needed (swelling)., Starting 02/13/2016, Until Discontinued, No Print    predniSONE (DELTASONE) 5 MG tablet Take 4 tabs po day1; take 3 tabs po day 2,3; take 2 tabs po day 4,5; take one tab po day6,7, Print      CONTINUE these medications which have NOT CHANGED   Details  aspirin EC 81 MG tablet Take 81 mg by mouth daily., Until Discontinued, Historical Med    Calcium Carbonate-Vitamin D (CALCIUM-VITAMIN D) 500-200 MG-UNIT tablet Take 2 tablets by mouth daily. , Until Discontinued, Historical Med    folic acid (FOLVITE) 1 MG tablet Take 1 mg by mouth daily., Starting 04/12/2015, Until Discontinued, Historical Med    glucose blood (ACCU-CHEK SMARTVIEW) test strip Use as  instructed, Print    Hydrocodone-Acetaminophen (VICODIN) 5-300 MG TABS Take 1 tablet by mouth every 4 (four) hours as needed., Starting 01/27/2016, Until Discontinued, Print    Lancets (ACCU-CHEK SOFT TOUCH) lancets 1 each by Other route 3 (three) times daily., Starting 11/10/2015, Until Discontinued, Normal    lansoprazole (PREVACID) 30 MG capsule take 1 capsule by mouth once daily 30 MINUTES PRIOR TO BREAKFAST, Historical Med    LINZESS 290 MCG CAPS capsule Take 290 mcg by mouth every morning., Starting 04/12/2015, Until Discontinued, Historical Med    OLANZapine (ZYPREXA) 15 MG tablet Take 15 mg by mouth at bedtime as needed (for bipolar.). , Starting 05/16/2015, Until Discontinued, Historical Med    traZODone (DESYREL) 100 MG tablet Take 100 mg by mouth at bedtime as needed for sleep. , Until Discontinued, Historical Med      STOP taking these medications     B-D INS SYR ULTRAFINE 1CC/31G 31G X 5/16" 1 ML MISC      linagliptin (TRADJENTA) 5 MG TABS tablet      meloxicam (MOBIC) 15 MG tablet      metFORMIN (GLUCOPHAGE) 1000 MG tablet      methotrexate 50 MG/2ML injection          DISCHARGE INSTRUCTIONS:   Follow-up PMD one week Follow-up with nephrology in 1 week  If you experience worsening of your admission symptoms, develop shortness of breath, life threatening emergency, suicidal or homicidal thoughts you must seek medical attention immediately by calling 911 or calling your MD immediately  if symptoms less severe.  You Must read complete instructions/literature along with all the possible adverse reactions/side effects for all the Medicines you take and that have been prescribed to you. Take any new Medicines after you have completely understood and accept all the possible adverse reactions/side effects.   Please note  You were cared for by a hospitalist during your hospital stay. If you have any questions about your discharge medications or the care you received while you  were in the hospital after you are discharged, you can call the unit and asked to speak with the hospitalist on call if the hospitalist that took care of you is not available. Once you are discharged, your primary care physician will handle any further medical issues. Please note that NO REFILLS for any discharge medications will be authorized once you are discharged, as it is imperative that you return to your primary care physician (or establish a relationship with a primary care physician if you do not have one) for your aftercare needs so that they can reassess your need for medications and monitor your lab values.    Today   CHIEF COMPLAINT:   Chief Complaint  Patient presents with  . Facial Swelling    HISTORY OF PRESENT ILLNESS:  Elizabeth [name]  is a 50 y.o. female presented with facial swelling and found to be in acute renal failure   VITAL SIGNS:  Blood pressure 101/72, pulse 58, temperature 97.5 F (36.4 C), temperature source Oral, resp. rate 16, height 5\' 6"  (1.676 m), weight 87.181 kg (192  lb 3.2 oz), last menstrual period 07/09/2014, SpO2 100 %.    PHYSICAL EXAMINATION:  GENERAL:  50 y.o.-year-old patient lying in the bed with no acute distress.  EYES: Pupils equal, round, reactive to light and accommodation. No scleral icterus. Extraocular muscles intact.  HEENT: Head atraumatic, normocephalic. Oropharynx and nasopharynx clear.  NECK:  Supple, no jugular venous distention. No thyroid enlargement, no tenderness.  LUNGS: Normal breath sounds bilaterally, no wheezing, rales,rhonchi or crepitation. No use of accessory muscles of respiration.  CARDIOVASCULAR: S1, S2 normal. No murmurs, rubs, or gallops.  ABDOMEN: Soft, non-tender, non-distended. Bowel sounds present. No organomegaly or mass.  EXTREMITIES: No pedal edema, cyanosis, or clubbing.  NEUROLOGIC: Cranial nerves II through XII are intact. Muscle strength 5/5 in all extremities. Sensation intact. Gait not checked.   PSYCHIATRIC: The patient is alert and oriented x 3.  SKIN: No obvious rash, lesion, or ulcer.   DATA REVIEW:   CBC  Recent Labs Lab 02/12/16 0544  WBC 12.8*  HGB 11.6*  HCT 33.9*  PLT 365    Chemistries   Recent Labs Lab 02/11/16 0524  02/13/16 0613  NA 139  < > 143  K 4.5  < > 5.1  CL 110  < > 118*  CO2 18*  < > 21*  GLUCOSE 107*  < > 117*  BUN 49*  < > 55*  CREATININE 7.01*  < > 3.04*  CALCIUM 7.9*  < > 9.1  AST 9*  --   --   ALT 10*  --   --   ALKPHOS 75  --   --   BILITOT 0.5  --   --   < > = values in this interval not displayed.    RADIOLOGY:  US Renal  02/12/2016  CLINICAL DATA:  Acute renal failure. EXAM: RENAL / URINARY TRACT ULTRASOUND COMPLETE COMPARISON:  None. FINDINGS: Right Kidney: Length: 13.0 cm. Echogenicity within normal limits. No mass or hydronephrosis visualized. Left Kidney: Length: 12.2 cm. Echogenicity within normal limits. No size mass or hydronephrosis visualized. 1.3 x 1.8 x 1.4 cm simple cyst left mid kidney. Bladder: Appears normal for degree of bladder distention. IMPRESSION: 1.8 cm simple cyst left mid kidney.  Exam otherwise unremarkable. Electronically Signed   By: Marcello Moores  Register   On: 02/12/2016 10:36    Management plans discussed with the patient, family and they are in agreement.  CODE STATUS:  Code Status History    Date Active Date Inactive Code Status Order ID Comments User Context   02/11/2016  8:36 AM 02/13/2016  2:07 PM Full Code [id number]  Loletha Grayer, MD ED   01/24/2016  9:36 AM 01/27/2016  1:58 PM Full Code [id number]  Florene Glen, MD Inpatient   05/07/2015  1:26 PM 05/10/2015  3:07 PM Full Code [id number]  Sherri Rad, MD Inpatient      TOTAL TIME TAKING CARE OF THIS PATIENT: 35 minutes.    Loletha Grayer M.D on 02/13/2016 at 3:46 PM  Between 7am to 6pm - Pager - [phone]  After 6pm go to [email or web address] - password [email or web address]  Office  [phone]  CC: Primary care physician;  Bobetta Lime, MD

## 2016-02-13 NOTE — Discharge Instructions (Signed)
DO NOT TAKE ENALAPRIL (ACE Inhibitor, or ARB drug Class)  Keep abdominal wound covered

## 2016-02-13 NOTE — Progress Notes (Signed)
Patient had surgery on 01/31/16 staples removed by Dr. Jamal Collin on 3/6/7  Two areas on midline incisions are open, steri strips applied and both openings covered with non stick dressing

## 2016-02-14 ENCOUNTER — Encounter: Payer: Medicare Other | Admitting: Surgery

## 2016-02-16 ENCOUNTER — Encounter: Payer: Self-pay | Admitting: Family Medicine

## 2016-02-16 ENCOUNTER — Ambulatory Visit (INDEPENDENT_AMBULATORY_CARE_PROVIDER_SITE_OTHER): Payer: Medicare Other | Admitting: Family Medicine

## 2016-02-16 VITALS — BP 122/78 | HR 119 | Temp 98.3°F | Resp 14 | Wt 192.0 lb

## 2016-02-16 DIAGNOSIS — T148 Other injury of unspecified body region: Secondary | ICD-10-CM

## 2016-02-16 DIAGNOSIS — K589 Irritable bowel syndrome without diarrhea: Secondary | ICD-10-CM

## 2016-02-16 DIAGNOSIS — N179 Acute kidney failure, unspecified: Secondary | ICD-10-CM | POA: Diagnosis not present

## 2016-02-16 DIAGNOSIS — I10 Essential (primary) hypertension: Secondary | ICD-10-CM | POA: Diagnosis not present

## 2016-02-16 DIAGNOSIS — T148XXD Other injury of unspecified body region, subsequent encounter: Secondary | ICD-10-CM | POA: Insufficient documentation

## 2016-02-16 MED ORDER — LINZESS 290 MCG PO CAPS: 290.0000 ug | ORAL_CAPSULE | ORAL | Status: DC

## 2016-02-16 NOTE — Progress Notes (Signed)
Name: Elizabeth [name]   MRN: [id number]    DOB: Aug 21, 1966   Date:02/16/2016       Progress Note  Subjective  Chief Complaint  Chief Complaint  Patient presents with  . Hospitalization Follow-up    allergic reaction to Enalapril  . Angioedema  . Chronic Renal Failure    HPI  Elizabeth [name] is a 50 year old female here for follow up after being admitted to the hospital due to angioedema from ACE inhibitor medication, Enalapril 5 mg one a day. She also had acute renal failure. Normally her eGFR is >60 ml/min but during hospitalization found to be down to 9 mL/min and up to 20 mL/min on discharge. Reports feeling fatigued but better than she did some weeks ago.   If you may recall she had a recent sigmoid colectomy on 01/24/16 and had urinary cath placed for about 2 days around the surgery. She was having vague urinary symptoms however urine culture was unremarkable. No longer having these symptoms. Denies flank pain, dysuria, pelvic pressure. Still making adequate urine volume.   Has appointment with Nephrologist 02/21/16.   Abdominal wound still not completely closed. Needs refill of IBS medication.   Past Medical History  Diagnosis Date  . Irritable bowel syndrome   . RA (rheumatoid arthritis) ([place])   . Bipolar 1 disorder ([place])   . Insomnia   . GERD (gastroesophageal reflux disease)   . Diverticulosis   . Motion sickness     boats  . Hepatitis     B - "not active"  . Heart murmur     followed by PCP  . Diabetes mellitus without complication ([place])   . Depression     Patient Active Problem List   Diagnosis Date Noted  . Acute renal failure (ARF) ([place]) 02/11/2016  . Diverticula of colon 01/24/2016  . Hypertension goal BP (blood pressure) < 140/90 01/17/2016  . Bursitis, trochanteric 11/22/2015  . Breast mass, right 11/08/2015  . Inversion, nipple 11/08/2015  . Diabetes mellitus type 2, uncontrolled, without complications ([place]) 78/29/5621  . Elevated liver enzymes 10/23/2015  .  RA (rheumatoid arthritis) ([place]) 10/23/2015  . Rheumatoid arthritis involving both hands ([place]) 10/23/2015  . Benign neoplasm of descending colon   . Benign neoplasm of sigmoid colon   . Bipolar disorder, current episode depressed, moderate ([place]) 07/03/2015  . Chronic rheumatic arthritis ([place]) 07/03/2015  . IBS (irritable bowel syndrome) 07/03/2015  . Insomnia, uncontrolled 07/03/2015  . GERD without esophagitis 07/03/2015  . Obesity, Class II, BMI 35-39.9 07/03/2015  . Bilateral hip bursitis 07/03/2015  . Diverticulitis large intestine 05/07/2015    Social History  Substance Use Topics  . Smoking status: Light Tobacco Smoker -- 0.50 packs/day    Types: Cigarettes    Last Attempt to Quit: 05/06/2015  . Smokeless tobacco: Never Used  . Alcohol Use: No     Current outpatient prescriptions:  .  aspirin EC 81 MG tablet, Take 81 mg by mouth daily., Disp: , Rfl:  .  Calcium Carbonate-Vitamin D (CALCIUM-VITAMIN D) 500-200 MG-UNIT tablet, Take 2 tablets by mouth daily. , Disp: , Rfl:  .  diphenhydrAMINE (BENADRYL) 25 mg capsule, Take 1 capsule (25 mg total) by mouth every 8 (eight) hours as needed (swelling)., Disp: 30 capsule, Rfl: 0 .  folic acid (FOLVITE) 1 MG tablet, Take 1 mg by mouth daily., Disp: , Rfl: 0 .  glucose blood (ACCU-CHEK SMARTVIEW) test strip, Use as instructed, Disp: 100 each, Rfl: 12 .  Hydrocodone-Acetaminophen (  VICODIN) 5-300 MG TABS, Take 1 tablet by mouth every 4 (four) hours as needed., Disp: 30 each, Rfl: 0 .  Lancets (ACCU-CHEK SOFT TOUCH) lancets, 1 each by Other route 3 (three) times daily., Disp: 100 each, Rfl: 12 .  lansoprazole (PREVACID) 30 MG capsule, take 1 capsule by mouth once daily 30 MINUTES PRIOR TO BREAKFAST, Disp: , Rfl: 1 .  LINZESS 290 MCG CAPS capsule, Take 290 mcg by mouth every morning., Disp: , Rfl: 1 .  OLANZapine (ZYPREXA) 15 MG tablet, Take 15 mg by mouth at bedtime as needed (for bipolar.). , Disp: , Rfl: 0 .  predniSONE (DELTASONE) 5 MG  tablet, Take 4 tabs po day1; take 3 tabs po day 2,3; take 2 tabs po day 4,5; take one tab po day6,7, Disp: 16 tablet, Rfl: 0 .  traZODone (DESYREL) 100 MG tablet, Take 100 mg by mouth at bedtime as needed for sleep. , Disp: , Rfl:   Past Surgical History  Procedure Laterality Date  . Tubal ligation    . Esophagogastroduodenoscopy (egd) with esophageal dilation    . Cholecystectomy  1997    [place], [place]  . Colonoscopy with propofol N/A 09/07/2015    Procedure: COLONOSCOPY WITH PROPOFOL;  Surgeon: Lucilla Lame, MD;  Location: [facility];  Service: Endoscopy;  Laterality: N/A;  . Esophagogastroduodenoscopy (egd) with propofol N/A 09/07/2015    Procedure: ESOPHAGOGASTRODUODENOSCOPY (EGD) WITH PROPOFOL;  Surgeon: Lucilla Lame, MD;  Location: [facility];  Service: Endoscopy;  Laterality: N/A;  . Polypectomy N/A 09/07/2015    Procedure: POLYPECTOMY;  Surgeon: Lucilla Lame, MD;  Location: [facility];  Service: Endoscopy;  Laterality: N/A;  . Partial colectomy N/A 01/24/2016    Procedure: SIGMOID COLECTOMY;  Surgeon: Florene Glen, MD;  Location: [facility];  Service: General;  Laterality: N/A;  . Bowel resection      Family History  Problem Relation Age of Onset  . Hypertension Mother   . Diabetes Mother   . Arthritis/Rheumatoid Mother   . Alcohol abuse Father   . Esophageal varices Father   . Diverticulosis Maternal Aunt   . Diverticulosis Maternal Grandmother     Allergies  Allergen Reactions  . Ace Inhibitors Swelling    Angioedema (specifically from enalapril)  . Enalapril Swelling    angioedema  . Penicillins Hives and Swelling    Has patient had a PCN reaction causing immediate rash, facial/tongue/throat swelling, SOB or lightheadedness with hypotension: Yes Has patient had a PCN reaction causing severe rash involving mucus membranes or skin necrosis: No Has patient had a PCN reaction that required hospitalization Yes Has patient had a PCN reaction  occurring within the last 10 years: No If all of the above answers are "NO", then may proceed with Cephalosporin use.     Review of Systems  CONSTITUTIONAL: No significant weight changes, fever, chills, weakness. Yes fatigue.  SKIN: No rash or itching.  CARDIOVASCULAR: No chest pain, chest pressure or chest discomfort. No palpitations or edema.  RESPIRATORY: No shortness of breath, cough or sputum.  GASTROINTESTINAL: No anorexia, nausea, vomiting. No changes in bowel habits. No abdominal pain or blood.  GENITOURINARY: No dysuria. No frequency. No discharge. NEUROLOGICAL: No headache, dizziness, syncope, paralysis, ataxia, numbness or tingling in the extremities. No memory changes. No change in bowel or bladder control.  MUSCULOSKELETAL: Chronic joint pain. No muscle pain. HEMATOLOGIC: No anemia, bleeding or bruising.  LYMPHATICS: No enlarged lymph nodes.  PSYCHIATRIC: No change in mood. No change in sleep pattern.  ENDOCRINOLOGIC: No reports of sweating, cold or heat intolerance. No polyuria or polydipsia.     Objective  BP 122/78 mmHg  Pulse 119  Temp(Src) 98.3 F (36.8 C) (Oral)  Resp 14  Wt 192 lb (87.091 kg)  SpO2 98%  LMP 07/09/2014 Body mass index is 31 kg/(m^2).  Physical Exam  Constitutional: Patient appears overweight and well-nourished. In no distress.  HEENT:  - Head: Normocephalic and atraumatic.  - Mouth/Throat: Oropharynx is clear and moist. No tonsillar hypertrophy or erythema. No post nasal drainage.  Neck: Normal range of motion. Neck supple. No JVD present. No thyromegaly present.  Cardiovascular: Normal rate, regular rhythm and normal heart sounds.  No murmur heard.  Pulmonary/Chest: Effort normal and breath sounds normal. No respiratory distress. Abdomen: Soft, obese, vertical incision from umbilicus to suprapubic area with gauze over. No erythema of surrounding skin. Normal bowel sounds.  Musculoskeletal: Normal range of motion bilateral UE and LE, no  joint effusions. Peripheral vascular: Bilateral LE no edema. Neurological: CN II-XII grossly intact with no focal deficits. Alert and oriented to person, place, and time. Coordination, balance, strength, speech and gait are normal.  Skin: Skin is warm and dry. No rash noted. No erythema.  Psychiatric: Patient has a normal mood and affect. Behavior is normal in office today. Judgment and thought content normal in office today.   Recent Results (from the past 2160 hour(s))  Quantiferon tb gold assay     Status: None   Collection Time: 11/29/15  3:58 PM  Result Value Ref Range   QUANTIFERON INCUBATION Comment     Comment: Specimen incubated at [facility], [place], [place].  QuantiFERON In Tube     Status: None   Collection Time: 11/29/15  3:58 PM  Result Value Ref Range   QUANTIFERON TB GOLD Negative Negative   QUANTIFERON CRITERIA Comment     Comment: To be considered positive a specimen should have a TB Ag minus Nil value greater than or equal to 0.35 IU/mL and in addition the TB Ag minus Nil value must be greater than or equal to 25% of the Nil value. There may be insufficient information in these values to differentiate between some negative and some indeterminate test values.    QUANTIFERON TB AG VALUE 0.04 IU/mL   Quantiferon Nil Value 0.04 IU/mL   QUANTIFERON MITOGEN VALUE >10.00 IU/mL   QFT TB AG MINUS NIL VALUE 0.00 IU/mL   Interpretation: Comment     Comment: The QuantiFERON TB Gold (in Tube) assay is intended for use as an aid in the diagnosis of TB infection. Negative results suggest that there is no TB infection. In patients with high suspicion of exposure, a negative test should be repeated. A positive test indicates infection with Mycobacterium tuberculosis. Among individuals without tuberculosis infection, a positive test may be due to exposure to [place], M. szulgai or M. marinum. On the Internet, go to https://figueroa-lambert.info/ for further details.   Pap IG and HPV (high risk)  DNA detection     Status: None   Collection Time: 11/30/15 12:00 AM  Result Value Ref Range   DIAGNOSIS: Comment     Comment: NEGATIVE FOR INTRAEPITHELIAL LESION AND MALIGNANCY.   Specimen adequacy: Comment     Comment: Satisfactory for evaluation. Endocervical and/or squamous metaplastic cells (endocervical component) are present.    CLINICIAN PROVIDED ICD10: Comment     Comment: Z12.4   Performed by: Comment     Comment: Marlynn Perking, Cytotechnologist (ASCP)   PAP SMEAR COMMENT .  Note: Comment     Comment: The Pap smear is a screening test designed to aid in the detection of premalignant and malignant conditions of the uterine cervix.  It is not a diagnostic procedure and should not be used as the sole means of detecting cervical cancer.  Both false-positive and false-negative reports do occur.    Test Methodology Comment     Comment: This liquid based ThinPrep(R) pap test was screened with the use of an image guided system.    HPV, high-risk Negative Negative    Comment: This high-risk HPV test detects thirteen high-risk types (16/18/31/33/35/39/45/51/52/56/58/59/68) without differentiation.   CBC WITH DIFFERENTIAL     Status: Abnormal   Collection Time: 01/16/16  9:07 AM  Result Value Ref Range   WBC 4.5 3.6 - 11.0 K/uL   RBC 4.50 3.80 - 5.20 MIL/uL   Hemoglobin 13.6 12.0 - 16.0 g/dL   HCT 40.2 35.0 - 47.0 %   MCV 89.4 80.0 - 100.0 fL   MCH 30.1 26.0 - 34.0 pg   MCHC 33.7 32.0 - 36.0 g/dL   RDW 15.1 (H) 11.5 - 14.5 %   Platelets 248 150 - 440 K/uL   Neutrophils Relative % 60 %   Neutro Abs 2.7 1.4 - 6.5 K/uL   Lymphocytes Relative 31 %   Lymphs Abs 1.4 1.0 - 3.6 K/uL   Monocytes Relative 6 %   Monocytes Absolute 0.3 0.2 - 0.9 K/uL   Eosinophils Relative 2 %   Eosinophils Absolute 0.1 0 - 0.7 K/uL   Basophils Relative 1 %   Basophils Absolute 0.0 0 - 0.1 K/uL  Comprehensive metabolic panel     Status: Abnormal   Collection Time: 01/16/16  9:07 AM  Result  Value Ref Range   Sodium 139 135 - 145 mmol/L   Potassium 4.3 3.5 - 5.1 mmol/L   Chloride 107 101 - 111 mmol/L   CO2 25 22 - 32 mmol/L   Glucose, Bld 113 (H) 65 - 99 mg/dL   BUN 13 6 - 20 mg/dL   Creatinine, Ser 0.70 0.44 - 1.00 mg/dL   Calcium 10.1 8.9 - 10.3 mg/dL   Total Protein 8.0 6.5 - 8.1 g/dL   Albumin 4.4 3.5 - 5.0 g/dL   AST 32 15 - 41 U/L   ALT 42 14 - 54 U/L   Alkaline Phosphatase 84 38 - 126 U/L   Total Bilirubin 0.3 0.3 - 1.2 mg/dL   GFR calc non Af Amer >60 >60 mL/min   GFR calc Af Amer >60 >60 mL/min    Comment: (NOTE) The eGFR has been calculated using the CKD EPI equation. This calculation has not been validated in all clinical situations. eGFR's persistently <60 mL/min signify possible Chronic Kidney Disease.    Anion gap 7 5 - 15  Surgical pcr screen     Status: None   Collection Time: 01/16/16  9:07 AM  Result Value Ref Range   MRSA, PCR NEGATIVE NEGATIVE   Staphylococcus aureus NEGATIVE NEGATIVE    Comment:        The Xpert SA Assay (FDA approved for NASAL specimens in patients over 13 years of age), is one component of a comprehensive surveillance program.  Test performance has been validated by [facility] for patients greater than or equal to 82 year old. It is not intended to diagnose infection nor to guide or monitor treatment.   Glucose, capillary     Status: Abnormal   Collection Time: 01/24/16  6:18 AM  Result Value Ref Range   Glucose-Capillary 137 (H) 65 - 99 mg/dL  Surgical pathology     Status: None   Collection Time: 01/24/16  7:53 AM  Result Value Ref Range   SURGICAL PATHOLOGY      Surgical Pathology CASE: [id number] PATIENT: Odelia Gage Surgical Pathology Report     SPECIMEN SUBMITTED: A. Appendix B. Colon, sigmoid  CLINICAL HISTORY: None provided  PRE-OPERATIVE DIAGNOSIS: History of acute sigmoid diverticulitis  POST-OPERATIVE DIAGNOSIS: Same as pre-op     DIAGNOSIS: A. APPENDIX; APPENDECTOMY: - NO  PATHOLOGIC CHANGES.  B. SIGMOID COLON; SEGMENTAL RESECTION: - DIVERTICULOSIS WITHOUT ACTIVE INFLAMMATION OR ABSCESS. - NEGATIVE FOR NEOPLASM.   GROSS DESCRIPTION: A. Labeled: appendix Size: 4.7 x 0.8 x 0.8 cm External surface: pink-tan with focal creeping fibrofatty tissue Perforation: no Fecalith: no  Description:  The proximal margin is inked blue. Mucosa pink to red with the wall thickness up to 0.4 cm.  Block summary: 1 - representative sections   B. Labeled: sigmoid colon Part of bowel received: sigmoid Measurement: 9.5 cm long by 2.5 cm in diameter Specimen Integrity (Describe any perforations): open at both r esection ends and otherwise intact Orientation: no orientation External surface: predominantly smooth serosa with focal fibrous adhesion Other remarkable findings: with multiple diverticula. all grossly intact  Lymph nodes: 1 possible  Block Summary: 1-2-en face mucosal margins 3-5-representative diverticula (cassette 5 also with lymph node candidate)  Final Diagnosis performed by Bryan Lemma, MD.  Electronically signed 01/25/2016 4:34:54PM    The electronic signature indicates that the named Attending Pathologist has evaluated the specimen  Technical component performed at [facility], [address], [place], [place] [address] Lab: [phone] Dir: Darrick Penna. Evette Doffing, MD  Professional component performed at [facility], [facility], [place], [place],  [address] Lab: [phone] Dir: Dellia Nims. Rubinas, MD    Glucose, capillary     Status: Abnormal   Collection Time: 01/24/16  9:29 AM  Result Value Ref Range   Glucose-Capillary 178 (H) 65 - 99 mg/dL  Basic metabolic panel     Status: Abnormal   Collection Time: 01/25/16  3:54 AM  Result Value Ref Range   Sodium 140 135 - 145 mmol/L   Potassium 3.9 3.5 - 5.1 mmol/L   Chloride 109 101 - 111 mmol/L   CO2 26 22 - 32 mmol/L   Glucose, Bld 156 (H) 65 - 99 mg/dL   BUN 10 6 -  20 mg/dL   Creatinine, Ser 0.71 0.44 - 1.00 mg/dL   Calcium 8.8 (L) 8.9 - 10.3 mg/dL   GFR calc non Af Amer >60 >60 mL/min   GFR calc Af Amer >60 >60 mL/min    Comment: (NOTE) The eGFR has been calculated using the CKD EPI equation. This calculation has not been validated in all clinical situations. eGFR's persistently <60 mL/min signify possible Chronic Kidney Disease.    Anion gap 5 5 - 15  CBC     Status: Abnormal   Collection Time: 01/25/16  3:54 AM  Result Value Ref Range   WBC 8.8 3.6 - 11.0 K/uL   RBC 4.07 3.80 - 5.20 MIL/uL   Hemoglobin 12.5 12.0 - 16.0 g/dL   HCT 36.7 35.0 - 47.0 %   MCV 90.2 80.0 - 100.0 fL   MCH 30.6 26.0 - 34.0 pg   MCHC 33.9 32.0 - 36.0 g/dL   RDW 14.9 (H) 11.5 - 14.5 %   Platelets 221 150 - 440 K/uL  CBC with Differential/Platelet     Status: Abnormal   Collection Time: 01/26/16  3:31 AM  Result Value Ref Range   WBC 6.6 3.6 - 11.0 K/uL   RBC 3.77 (L) 3.80 - 5.20 MIL/uL   Hemoglobin 11.5 (L) 12.0 - 16.0 g/dL   HCT 34.5 (L) 35.0 - 47.0 %   MCV 91.3 80.0 - 100.0 fL   MCH 30.6 26.0 - 34.0 pg   MCHC 33.5 32.0 - 36.0 g/dL   RDW 14.5 11.5 - 14.5 %   Platelets 193 150 - 440 K/uL   Neutrophils Relative % 76 %   Neutro Abs 4.9 1.4 - 6.5 K/uL   Lymphocytes Relative 17 %   Lymphs Abs 1.1 1.0 - 3.6 K/uL   Monocytes Relative 6 %   Monocytes Absolute 0.4 0.2 - 0.9 K/uL   Eosinophils Relative 1 %   Eosinophils Absolute 0.1 0 - 0.7 K/uL   Basophils Relative 0 %   Basophils Absolute 0.0 0 - 0.1 K/uL  Basic metabolic panel     Status: Abnormal   Collection Time: 01/26/16  3:31 AM  Result Value Ref Range   Sodium 141 135 - 145 mmol/L   Potassium 4.0 3.5 - 5.1 mmol/L   Chloride 107 101 - 111 mmol/L   CO2 27 22 - 32 mmol/L   Glucose, Bld 123 (H) 65 - 99 mg/dL   BUN 6 6 - 20 mg/dL   Creatinine, Ser 0.65 0.44 - 1.00 mg/dL   Calcium 8.6 (L) 8.9 - 10.3 mg/dL   GFR calc non Af Amer >60 >60 mL/min   GFR calc Af Amer >60 >60 mL/min    Comment: (NOTE) The  eGFR has been calculated using the CKD EPI equation. This calculation has not been validated in all clinical situations. eGFR's persistently <60 mL/min signify possible Chronic Kidney Disease.    Anion gap 7 5 - 15  Urine culture     Status: None   Collection Time: 01/30/16 10:01 AM  Result Value Ref Range   Specimen Description URINE, CLEAN CATCH    Special Requests NONE    Culture MULTIPLE SPECIES PRESENT, SUGGEST RECOLLECTION    Report Status 02/01/2016 FINAL   Urinalysis complete, with microscopic ([facility] only)     Status: Abnormal   Collection Time: 01/30/16 10:01 AM  Result Value Ref Range   Color, Urine YELLOW (A) YELLOW   APPearance CLOUDY (A) CLEAR   Glucose, UA NEGATIVE NEGATIVE mg/dL   Bilirubin Urine NEGATIVE NEGATIVE   Ketones, ur NEGATIVE NEGATIVE mg/dL   Specific Gravity, Urine 1.020 1.005 - 1.030   Hgb urine dipstick 1+ (A) NEGATIVE   pH 5.0 5.0 - 8.0   Protein, ur NEGATIVE NEGATIVE mg/dL   Nitrite NEGATIVE NEGATIVE   Leukocytes, UA NEGATIVE NEGATIVE   RBC / HPF NONE SEEN 0 - 5 RBC/hpf   WBC, UA 0-5 0 - 5 WBC/hpf   Bacteria, UA NONE SEEN NONE SEEN   Squamous Epithelial / LPF 0-5 (A) NONE SEEN   Mucous PRESENT    Hyaline Casts, UA PRESENT    Amorphous Crystal PRESENT   Urine Culture     Status: None   Collection Time: 02/05/16 12:00 AM  Result Value Ref Range   Urine Culture, Routine Final report    Urine Culture result 1 Comment     Comment: Mixed urogenital flora Less than 10,000 colonies/mL   POCT urinalysis dipstick     Status: None   Collection Time: 02/05/16 10:29 AM  Result Value Ref Range   Color, UA amber    Clarity, UA clear    Glucose, UA neg    Bilirubin, UA neg    Ketones, UA trace    Spec Grav, UA 1.020    Blood, UA mod    pH, UA 6.0    Protein, UA neg    Urobilinogen, UA negative    Nitrite, UA neg    Leukocytes, UA Negative Negative  Comprehensive metabolic panel     Status: Abnormal   Collection Time: 02/11/16  5:24 AM  Result  Value Ref Range   Sodium 139 135 - 145 mmol/L   Potassium 4.5 3.5 - 5.1 mmol/L   Chloride 110 101 - 111 mmol/L   CO2 18 (L) 22 - 32 mmol/L   Glucose, Bld 107 (H) 65 - 99 mg/dL   BUN 49 (H) 6 - 20 mg/dL   Creatinine, Ser 7.01 (H) 0.44 - 1.00 mg/dL   Calcium 7.9 (L) 8.9 - 10.3 mg/dL   Total Protein 7.2 6.5 - 8.1 g/dL   Albumin 3.3 (L) 3.5 - 5.0 g/dL   AST 9 (L) 15 - 41 U/L   ALT 10 (L) 14 - 54 U/L   Alkaline Phosphatase 75 38 - 126 U/L   Total Bilirubin 0.5 0.3 - 1.2 mg/dL   GFR calc non Af Amer 6 (L) >60 mL/min   GFR calc Af Amer 7 (L) >60 mL/min    Comment: (NOTE) The eGFR has been calculated using the CKD EPI equation. This calculation has not been validated in all clinical situations. eGFR's persistently <60 mL/min signify possible Chronic Kidney Disease.    Anion gap 11 5 - 15  CBC with Differential/Platelet     Status: Abnormal   Collection Time: 02/11/16  5:24 AM  Result Value Ref Range   WBC 7.8 3.6 - 11.0 K/uL   RBC 4.03 3.80 - 5.20 MIL/uL   Hemoglobin 12.1 12.0 - 16.0 g/dL   HCT 35.9 35.0 - 47.0 %   MCV 88.9 80.0 - 100.0 fL   MCH 29.9 26.0 - 34.0 pg   MCHC 33.6 32.0 - 36.0 g/dL   RDW 14.7 (H) 11.5 - 14.5 %   Platelets 372 150 - 440 K/uL   Neutrophils Relative % 62 %   Neutro Abs 4.8 1.4 - 6.5 K/uL   Lymphocytes Relative 26 %   Lymphs Abs 2.0 1.0 - 3.6 K/uL   Monocytes Relative 8 %   Monocytes Absolute 0.6 0.2 - 0.9 K/uL   Eosinophils Relative 3 %   Eosinophils Absolute 0.3 0 - 0.7 K/uL   Basophils Relative 1 %   Basophils Absolute 0.0 0 - 0.1 K/uL  Urinalysis complete, with microscopic ([facility] only)     Status: Abnormal   Collection Time: 02/11/16  7:22 AM  Result Value Ref Range   Color, Urine YELLOW (A) YELLOW   APPearance HAZY (A) CLEAR   Glucose, UA 50 (A) NEGATIVE mg/dL   Bilirubin Urine NEGATIVE NEGATIVE   Ketones, ur NEGATIVE NEGATIVE mg/dL   Specific Gravity, Urine 1.009 1.005 - 1.030   Hgb urine dipstick 1+ (A) NEGATIVE   pH 6.0 5.0 - 8.0    Protein, ur 100 (A) NEGATIVE mg/dL   Nitrite NEGATIVE NEGATIVE   Leukocytes, UA NEGATIVE NEGATIVE   RBC / HPF 0-5 0 - 5 RBC/hpf   WBC, UA TOO NUMEROUS TO COUNT 0 - 5 WBC/hpf   Bacteria, UA NONE SEEN NONE SEEN   Squamous Epithelial /  LPF 0-5 (A) NONE SEEN   WBC Clumps PRESENT    Mucous PRESENT   Basic metabolic panel     Status: Abnormal   Collection Time: 02/11/16  7:52 AM  Result Value Ref Range   Sodium 141 135 - 145 mmol/L   Potassium 4.7 3.5 - 5.1 mmol/L   Chloride 112 (H) 101 - 111 mmol/L   CO2 18 (L) 22 - 32 mmol/L   Glucose, Bld 141 (H) 65 - 99 mg/dL   BUN 45 (H) 6 - 20 mg/dL   Creatinine, Ser 6.08 (H) 0.44 - 1.00 mg/dL   Calcium 7.2 (L) 8.9 - 10.3 mg/dL   GFR calc non Af Amer 7 (L) >60 mL/min   GFR calc Af Amer 9 (L) >60 mL/min    Comment: (NOTE) The eGFR has been calculated using the CKD EPI equation. This calculation has not been validated in all clinical situations. eGFR's persistently <60 mL/min signify possible Chronic Kidney Disease.    Anion gap 11 5 - 15  Glucose, capillary     Status: Abnormal   Collection Time: 02/11/16 12:01 PM  Result Value Ref Range   Glucose-Capillary 166 (H) 65 - 99 mg/dL   Comment 1 Notify RN   Glucose, capillary     Status: Abnormal   Collection Time: 02/11/16  4:44 PM  Result Value Ref Range   Glucose-Capillary 195 (H) 65 - 99 mg/dL   Comment 1 Notify RN   Glucose, capillary     Status: Abnormal   Collection Time: 02/11/16 10:01 PM  Result Value Ref Range   Glucose-Capillary 135 (H) 65 - 99 mg/dL  Basic metabolic panel     Status: Abnormal   Collection Time: 02/12/16  5:44 AM  Result Value Ref Range   Sodium 140 135 - 145 mmol/L   Potassium 5.5 (H) 3.5 - 5.1 mmol/L   Chloride 113 (H) 101 - 111 mmol/L   CO2 18 (L) 22 - 32 mmol/L   Glucose, Bld 152 (H) 65 - 99 mg/dL   BUN 55 (H) 6 - 20 mg/dL   Creatinine, Ser 5.92 (H) 0.44 - 1.00 mg/dL   Calcium 8.3 (L) 8.9 - 10.3 mg/dL   GFR calc non Af Amer 8 (L) >60 mL/min   GFR calc  Af Amer 9 (L) >60 mL/min    Comment: (NOTE) The eGFR has been calculated using the CKD EPI equation. This calculation has not been validated in all clinical situations. eGFR's persistently <60 mL/min signify possible Chronic Kidney Disease.    Anion gap 9 5 - 15  CBC     Status: Abnormal   Collection Time: 02/12/16  5:44 AM  Result Value Ref Range   WBC 12.8 (H) 3.6 - 11.0 K/uL   RBC 3.84 3.80 - 5.20 MIL/uL   Hemoglobin 11.6 (L) 12.0 - 16.0 g/dL   HCT 33.9 (L) 35.0 - 47.0 %   MCV 88.4 80.0 - 100.0 fL   MCH 30.3 26.0 - 34.0 pg   MCHC 34.3 32.0 - 36.0 g/dL   RDW 14.8 (H) 11.5 - 14.5 %   Platelets 365 150 - 440 K/uL  Glucose, capillary     Status: Abnormal   Collection Time: 02/12/16  7:49 AM  Result Value Ref Range   Glucose-Capillary 132 (H) 65 - 99 mg/dL   Comment 1 Notify RN   Glucose, capillary     Status: Abnormal   Collection Time: 02/12/16 11:35 AM  Result Value Ref Range   Glucose-Capillary  235 (H) 65 - 99 mg/dL   Comment 1 Notify RN   Glucose, capillary     Status: Abnormal   Collection Time: 02/12/16  4:30 PM  Result Value Ref Range   Glucose-Capillary 190 (H) 65 - 99 mg/dL   Comment 1 Notify RN   Glucose, capillary     Status: Abnormal   Collection Time: 02/12/16  9:22 PM  Result Value Ref Range   Glucose-Capillary 195 (H) 65 - 99 mg/dL   Comment 1 Notify RN   Basic metabolic panel     Status: Abnormal   Collection Time: 02/13/16  6:13 AM  Result Value Ref Range   Sodium 143 135 - 145 mmol/L   Potassium 5.1 3.5 - 5.1 mmol/L   Chloride 118 (H) 101 - 111 mmol/L   CO2 21 (L) 22 - 32 mmol/L   Glucose, Bld 117 (H) 65 - 99 mg/dL   BUN 55 (H) 6 - 20 mg/dL   Creatinine, Ser 3.04 (H) 0.44 - 1.00 mg/dL   Calcium 9.1 8.9 - 10.3 mg/dL   GFR calc non Af Amer 17 (L) >60 mL/min   GFR calc Af Amer 20 (L) >60 mL/min    Comment: (NOTE) The eGFR has been calculated using the CKD EPI equation. This calculation has not been validated in all clinical situations. eGFR's  persistently <60 mL/min signify possible Chronic Kidney Disease.    Anion gap 4 (L) 5 - 15  Glucose, capillary     Status: None   Collection Time: 02/13/16  7:31 AM  Result Value Ref Range   Glucose-Capillary 99 65 - 99 mg/dL   Comment 1 Notify RN      Assessment & Plan  1. Hypertension goal BP (blood pressure) < 140/90 Well controled. Continue lifestyle changes to control BP. Avoid anti-hypertensive agent for now.  2. Acute renal failure, unspecified acute renal failure type ([place]) Acute etiology multifactorial due to medication toxic effects, angioedema from ACEi, recent surgery. Will repeat BMP.  - Basic Metabolic Panel (BMET)  3. IBS (irritable bowel syndrome) Stable. Refilled.  - LINZESS 290 MCG CAPS capsule; Take 1 capsule (290 mcg total) by mouth every morning.  Dispense: 90 capsule; Refill: 1  4. Wound healing, delayed Advised discussing with surgeon if gentle use of abdominal girdle will help surgical wound closure.

## 2016-02-17 LAB — BASIC METABOLIC PANEL
BUN/Creatinine Ratio: 19 (ref 9–23)
BUN: 19 mg/dL (ref 6–24)
CO2: 23 mmol/L (ref 18–29)
Calcium: 9.3 mg/dL (ref 8.7–10.2)
Chloride: 102 mmol/L (ref 96–106)
Creatinine, Ser: 0.99 mg/dL (ref 0.57–1.00)
GFR calc Af Amer: 78 mL/min/{1.73_m2} (ref >59)
GFR calc non Af Amer: 68 mL/min/{1.73_m2} (ref >59)
GLUCOSE: 117 mg/dL — AB (ref 65–99)
POTASSIUM: 4.6 mmol/L (ref 3.5–5.2)
SODIUM: 143 mmol/L (ref 134–144)

## 2016-02-19 ENCOUNTER — Encounter: Payer: Self-pay | Admitting: Surgery

## 2016-02-19 ENCOUNTER — Ambulatory Visit (INDEPENDENT_AMBULATORY_CARE_PROVIDER_SITE_OTHER): Payer: Medicare Other | Admitting: Surgery

## 2016-02-19 VITALS — BP 151/92 | HR 109 | Temp 98.3°F | Wt 198.0 lb

## 2016-02-19 DIAGNOSIS — Z09 Encounter for follow-up examination after completed treatment for conditions other than malignant neoplasm: Secondary | ICD-10-CM

## 2016-02-19 NOTE — Patient Instructions (Signed)

## 2016-02-19 NOTE — Progress Notes (Signed)
Elizabeth [name] is  following up after a sigmoid colectomy about a month ago by Dr. Sharon Seller came back with no malignancy. Developed angioedema renal failure a few weeks ago secondary to Ace inhibitors. And there was some minor dehiscence of the wound. Not she is doing better she is tolerating by mouth, having regular bowel movements and her pain from her prior diverticulitis is completely gone  Exam: No acute distress awake alert Abdomen: There is 2 separate very superficial wound measuring 3 cm on the upper portion and 2 cm on the lower portion there is good granulation tissue there is no evidence of tracking and there is no evidence of infection. No Peritonitis  A/P currently well from sigmoid colectomy. There is some minor superficial dehiscence of a portion of the wound that should heal in the next few weeks with dressing changes. Likely this was caused by her significant anasarca that she suffer for angioedema. We'll call us back if she has any other complaints

## 2016-02-21 ENCOUNTER — Ambulatory Visit: Payer: Medicare Other | Admitting: Family Medicine

## 2016-02-21 DIAGNOSIS — E1129 Type 2 diabetes mellitus with other diabetic kidney complication: Secondary | ICD-10-CM | POA: Diagnosis not present

## 2016-02-21 DIAGNOSIS — T783XXD Angioneurotic edema, subsequent encounter: Secondary | ICD-10-CM | POA: Diagnosis not present

## 2016-02-21 DIAGNOSIS — I10 Essential (primary) hypertension: Secondary | ICD-10-CM | POA: Diagnosis not present

## 2016-02-21 DIAGNOSIS — N179 Acute kidney failure, unspecified: Secondary | ICD-10-CM | POA: Diagnosis not present

## 2016-03-07 DIAGNOSIS — F250 Schizoaffective disorder, bipolar type: Secondary | ICD-10-CM | POA: Diagnosis not present

## 2016-03-11 ENCOUNTER — Ambulatory Visit (INDEPENDENT_AMBULATORY_CARE_PROVIDER_SITE_OTHER): Payer: Medicare Other | Admitting: Family Medicine

## 2016-03-11 VITALS — BP 130/80 | HR 96 | Temp 97.7°F | Resp 16 | Ht 67.0 in | Wt 204.0 lb

## 2016-03-11 DIAGNOSIS — E1165 Type 2 diabetes mellitus with hyperglycemia: Secondary | ICD-10-CM | POA: Diagnosis not present

## 2016-03-11 DIAGNOSIS — M05742 Rheumatoid arthritis with rheumatoid factor of left hand without organ or systems involvement: Secondary | ICD-10-CM | POA: Diagnosis not present

## 2016-03-11 DIAGNOSIS — IMO0001 Reserved for inherently not codable concepts without codable children: Secondary | ICD-10-CM

## 2016-03-11 DIAGNOSIS — E119 Type 2 diabetes mellitus without complications: Secondary | ICD-10-CM

## 2016-03-11 DIAGNOSIS — E669 Obesity, unspecified: Secondary | ICD-10-CM

## 2016-03-11 DIAGNOSIS — I10 Essential (primary) hypertension: Secondary | ICD-10-CM | POA: Diagnosis not present

## 2016-03-11 DIAGNOSIS — Z5181 Encounter for therapeutic drug level monitoring: Secondary | ICD-10-CM | POA: Diagnosis not present

## 2016-03-11 DIAGNOSIS — E559 Vitamin D deficiency, unspecified: Secondary | ICD-10-CM | POA: Insufficient documentation

## 2016-03-11 DIAGNOSIS — M05741 Rheumatoid arthritis with rheumatoid factor of right hand without organ or systems involvement: Secondary | ICD-10-CM

## 2016-03-11 LAB — POCT UA - MICROALBUMIN: Microalbumin Ur, POC: 0 mg/L

## 2016-03-11 LAB — POCT GLYCOSYLATED HEMOGLOBIN (HGB A1C): HEMOGLOBIN A1C: 6.4

## 2016-03-11 NOTE — Patient Instructions (Addendum)
Return on or after April 13th for fasting labs (cholesterol) Your goal blood pressure is less than 130 mmHg on top. Try to follow the DASH guidelines (DASH stands for Dietary Approaches to Stop Hypertension) Try to limit the sodium in your diet.  Ideally, consume less than 1.5 grams (less than 1,500mg ) per day. Do not add salt when cooking or at the table.  Check the sodium amount on labels when shopping, and choose items lower in sodium when given a choice. Avoid or limit foods that already contain a lot of sodium. Eat a diet rich in fruits and vegetables and whole grains. Keep up the great job you are doing!! Pick up 500 or 1000 mcg of B12 and you can take one a day or just one most days of the week    DASH Eating Plan DASH stands for "Dietary Approaches to Stop Hypertension." The DASH eating plan is a healthy eating plan that has been shown to reduce high blood pressure (hypertension). Additional health benefits may include reducing the risk of type 2 diabetes mellitus, heart disease, and stroke. The DASH eating plan may also help with weight loss. WHAT DO I NEED TO KNOW ABOUT THE DASH EATING PLAN? For the DASH eating plan, you will follow these general guidelines:  Choose foods with a percent daily value for sodium of less than 5% (as listed on the food label).  Use salt-free seasonings or herbs instead of table salt or sea salt.  Check with your health care provider or pharmacist before using salt substitutes.  Eat lower-sodium products, often labeled as "lower sodium" or "no salt added."  Eat fresh foods.  Eat more vegetables, fruits, and low-fat dairy products.  Choose whole grains. Look for the word "whole" as the first word in the ingredient list.  Choose fish and skinless chicken or [place] more often than red meat. Limit fish, poultry, and meat to 6 oz (170 g) each day.  Limit sweets, desserts, sugars, and sugary drinks.  Choose heart-healthy fats.  Limit cheese to 1 oz  (28 g) per day.  Eat more home-cooked food and less restaurant, buffet, and fast food.  Limit fried foods.  Cook foods using methods other than frying.  Limit canned vegetables. If you do use them, rinse them well to decrease the sodium.  When eating at a restaurant, ask that your food be prepared with less salt, or no salt if possible. WHAT FOODS CAN I EAT? Seek help from a dietitian for individual calorie needs. Grains Whole grain or whole wheat bread. Brown rice. Whole grain or whole wheat pasta. Quinoa, bulgur, and whole grain cereals. Low-sodium cereals. Corn or whole wheat flour tortillas. Whole grain cornbread. Whole grain crackers. Low-sodium crackers. Vegetables Fresh or frozen vegetables (raw, steamed, roasted, or grilled). Low-sodium or reduced-sodium tomato and vegetable juices. Low-sodium or reduced-sodium tomato sauce and paste. Low-sodium or reduced-sodium canned vegetables.  Fruits All fresh, canned (in natural juice), or frozen fruits. Meat and Other Protein Products Ground beef (85% or leaner), grass-fed beef, or beef trimmed of fat. Skinless chicken or [place]. Ground chicken or [place]. Pork trimmed of fat. All fish and seafood. Eggs. Dried beans, peas, or lentils. Unsalted nuts and seeds. Unsalted canned beans. Dairy Low-fat dairy products, such as skim or 1% milk, 2% or reduced-fat cheeses, low-fat ricotta or cottage cheese, or plain low-fat yogurt. Low-sodium or reduced-sodium cheeses. Fats and Oils Tub margarines without trans fats. Light or reduced-fat mayonnaise and salad dressings (reduced sodium). Avocado. Safflower, olive,  or canola oils. Natural peanut or almond butter. Other Unsalted popcorn and pretzels. The items listed above may not be a complete list of recommended foods or beverages. Contact your dietitian for more options. WHAT FOODS ARE NOT RECOMMENDED? Grains White bread. White pasta. White rice. Refined cornbread. Bagels and croissants. Crackers  that contain trans fat. Vegetables Creamed or fried vegetables. Vegetables in a cheese sauce. Regular canned vegetables. Regular canned tomato sauce and paste. Regular tomato and vegetable juices. Fruits Dried fruits. Canned fruit in light or heavy syrup. Fruit juice. Meat and Other Protein Products Fatty cuts of meat. Ribs, chicken wings, bacon, sausage, bologna, salami, chitterlings, fatback, hot dogs, bratwurst, and packaged luncheon meats. Salted nuts and seeds. Canned beans with salt. Dairy Whole or 2% milk, cream, half-and-half, and cream cheese. Whole-fat or sweetened yogurt. Full-fat cheeses or blue cheese. Nondairy creamers and whipped toppings. Processed cheese, cheese spreads, or cheese curds. Condiments Onion and garlic salt, seasoned salt, table salt, and sea salt. Canned and packaged gravies. Worcestershire sauce. Tartar sauce. Barbecue sauce. Teriyaki sauce. Soy sauce, including reduced sodium. Steak sauce. Fish sauce. Oyster sauce. Cocktail sauce. Horseradish. Ketchup and mustard. Meat flavorings and tenderizers. Bouillon cubes. Hot sauce. Tabasco sauce. Marinades. Taco seasonings. Relishes. Fats and Oils Butter, stick margarine, lard, shortening, ghee, and bacon fat. Coconut, palm kernel, or palm oils. Regular salad dressings. Other Pickles and olives. Salted popcorn and pretzels. The items listed above may not be a complete list of foods and beverages to avoid. Contact your dietitian for more information. WHERE CAN I FIND MORE INFORMATION? National Heart, Lung, and Blood Institute: travelstabloid.com   This information is not intended to replace advice given to you by your health care provider. Make sure you discuss any questions you have with your health care provider.   Document Released: 11/14/2011 Document Revised: 12/16/2014 Document Reviewed: 09/29/2013 Elsevier Interactive Patient Education [employer].

## 2016-03-11 NOTE — Progress Notes (Signed)
BP 130/80 mmHg  Pulse 96  Temp(Src) 97.7 F (36.5 C) (Oral)  Resp 16  Ht 5\' 7"  (1.702 m)  Wt 204 lb (92.534 kg)  BMI 31.94 kg/m2  SpO2 97%  LMP 07/09/2014   Subjective:    Patient ID: Elizabeth [name], female    DOB: 07-21-1966, 50 y.o.   MRN: [id number]  HPI: Elizabeth [name] is a 50 y.o. female  Chief Complaint  Patient presents with  . Medication Refill  . Diabetes    Checks Blood glucose tid Low-102, high-167  . Constipation    working well with Linzess  . Hypertension    was on Lisinopril but taken off due to side effects of facial swelling  . Gastroesophageal Reflux   She is here for follow-up; this is my first time seeing her as I am new to the practice  Type 2 diabetes; no known kidney problems; has reading glasses; mother has diabetes and on father's side Last A1c as 8.8 and now it is 6.4 She has lost weight; she was on metformin then got sick and had surgery; walking regularly  HTN; controlled today by herself; walking 2-1/2 miles a day; had angioedema with ACE-I; saw nephrologist, Dr. Juleen China; hospital labs reviewed; renal failure resolved  Perimenopausal; having some hot flashes  GERD; spaghetti and pizza are triggers  Relevant past medical, surgical, family and social history reviewed and updated as indicated Allergies and medications reviewed and updated.  Review of Systems; no chest pain, no trouble breathing Per HPI unless specifically indicated above     Objective:    BP 130/80 mmHg  Pulse 96  Temp(Src) 97.7 F (36.5 C) (Oral)  Resp 16  Ht 5\' 7"  (1.702 m)  Wt 204 lb (92.534 kg)  BMI 31.94 kg/m2  SpO2 97%  LMP 07/09/2014  Wt Readings from Last 3 Encounters:  03/11/16 204 lb (92.534 kg)  02/19/16 198 lb (89.812 kg)  02/16/16 192 lb (87.091 kg)    Physical Exam  Constitutional: She appears well-developed and well-nourished. No distress.  HENT:  Head: Normocephalic and atraumatic.  Eyes: EOM are normal. No scleral icterus.  Neck: No  thyromegaly present.  Cardiovascular: Normal rate, regular rhythm and normal heart sounds.   No murmur heard. Pulmonary/Chest: Effort normal and breath sounds normal. No respiratory distress. She has no wheezes.  Abdominal: Soft. Bowel sounds are normal. She exhibits no distension.  Musculoskeletal: Normal range of motion. She exhibits no edema.  Neurological: She is alert. She exhibits normal muscle tone.  Skin: Skin is warm and dry. She is not diaphoretic. No pallor.  Psychiatric: She has a normal mood and affect. Her behavior is normal. Judgment and thought content normal.    Diabetic Foot Form - Detailed   Diabetic Foot Exam - detailed  Diabetic Foot exam was performed with the following findings:  Yes 03/11/2016 10:35 AM  Are the toenails long?:  No  Are the toenails thick?:  No  Are the toenails ingrown?:  No  Normal Range of Motion:  Yes    Pulse Foot Exam completed.:  Yes  Right Dorsalis Pedis:  Present Left Dorsalis Pedis:  Present  Sensory Foot Exam Completed.:  Yes  Swelling:  No  Semmes-Weinstein Monofilament Test  R Site 1-Great Toe:  Pos L Site 1-Great Toe:  Pos  R Site 4:  Pos L Site 4:  Pos  R Site 5:  Pos L Site 5:  Pos       Results for orders  placed or performed in visit on 03/11/16  POCT HgB A1C  Result Value Ref Range   Hemoglobin A1C 6.4   POCT UA - Microalbumin  Result Value Ref Range   Microalbumin Ur, POC 0 mg/L   Creatinine, POC  mg/dL   Albumin/Creatinine Ratio, Urine, POC        Assessment & Plan:   Problem List Items Addressed This Visit      Cardiovascular and Mediastinum   Hypertension goal BP (blood pressure) < 140/90    Controlled today on her own; DASH guidelines        Endocrine   Diabetes mellitus type 2, controlled, without complications ([place]) - Primary    So glad that patient's A1c is down today; huge improvement from 8.8 to 6.4; no medicine at this time; continue to manage weight and diet; stay active; foot exam done by MD  today; eye exam UTD      Relevant Orders   Lipid Panel w/o Chol/HDL Ratio     Musculoskeletal and Integument   Rheumatoid arthritis involving both hands ([place])    Managed by rheumatologist        Other   Obesity (BMI 30.0-34.9)    So glad patient has managed to lose weight; praise given for healthier eating and walking      Vitamin D deficiency    Check level; explained vit D is one of the four that are fat soluble and can build up; monitor to see if further replacement needed      Relevant Orders   VITAMIN D 25 Hydroxy (Vit-D Deficiency, Fractures)   Medication monitoring encounter   Relevant Orders   Comprehensive metabolic panel       Follow up plan: Return in about 3 months (around 06/10/2016) for labs and diabetes and cholesterol.  An after-visit summary was printed and given to the patient at [facility].  Please see the patient instructions which may contain other information and recommendations beyond what is mentioned above in the assessment and plan. Orders Placed This Encounter  Procedures  . VITAMIN D 25 Hydroxy (Vit-D Deficiency, Fractures)  . Comprehensive metabolic panel  . Lipid Panel w/o Chol/HDL Ratio  . POCT HgB A1C  . POCT UA - Microalbumin

## 2016-03-11 NOTE — Assessment & Plan Note (Signed)
Managed by rheumatologist.

## 2016-03-11 NOTE — Assessment & Plan Note (Signed)
Controlled today on her own; DASH guidelines

## 2016-03-11 NOTE — Assessment & Plan Note (Signed)
Check level; explained vit D is one of the four that are fat soluble and can build up; monitor to see if further replacement needed

## 2016-03-11 NOTE — Assessment & Plan Note (Signed)
So glad patient has managed to lose weight; praise given for healthier eating and walking

## 2016-03-11 NOTE — Assessment & Plan Note (Signed)
So glad that patient's A1c is down today; huge improvement from 8.8 to 6.4; no medicine at this time; continue to manage weight and diet; stay active; foot exam done by MD today; eye exam UTD

## 2016-03-15 DIAGNOSIS — I10 Essential (primary) hypertension: Secondary | ICD-10-CM | POA: Diagnosis not present

## 2016-03-15 DIAGNOSIS — T783XXD Angioneurotic edema, subsequent encounter: Secondary | ICD-10-CM | POA: Diagnosis not present

## 2016-03-15 DIAGNOSIS — E1129 Type 2 diabetes mellitus with other diabetic kidney complication: Secondary | ICD-10-CM | POA: Diagnosis not present

## 2016-03-15 DIAGNOSIS — N179 Acute kidney failure, unspecified: Secondary | ICD-10-CM | POA: Diagnosis not present

## 2016-03-16 DIAGNOSIS — Z5181 Encounter for therapeutic drug level monitoring: Secondary | ICD-10-CM | POA: Insufficient documentation

## 2016-03-18 ENCOUNTER — Ambulatory Visit (INDEPENDENT_AMBULATORY_CARE_PROVIDER_SITE_OTHER): Payer: Medicare Other | Admitting: Surgery

## 2016-03-18 ENCOUNTER — Encounter: Payer: Self-pay | Admitting: Surgery

## 2016-03-18 VITALS — BP 137/79 | HR 82 | Temp 97.5°F | Wt 208.0 lb

## 2016-03-18 DIAGNOSIS — K5732 Diverticulitis of large intestine without perforation or abscess without bleeding: Secondary | ICD-10-CM

## 2016-03-18 NOTE — Progress Notes (Signed)
Patient status post sigmoid colon resection for diverticulitis. Her pathology is reviewed. She states that she is having no pain and her wound is healed she is eating well and is gained 8 pounds she did have an episode of what sounds like angioedema from an ACE inhibitor which she has stopped this required hospitalization for both breathing and kidney disease. Exam demonstrates healing wound no erythema no drainage nontender abdomen nondistended abdomen  Patient doing very well recommend follow up on an as-needed basis she is following up with her primary care physician for her diabetes and hypertension and is off her ACE inhibitor.

## 2016-03-18 NOTE — Patient Instructions (Addendum)
Please give us a call if you have any questions or concerns. 

## 2016-03-19 DIAGNOSIS — F250 Schizoaffective disorder, bipolar type: Secondary | ICD-10-CM | POA: Diagnosis not present

## 2016-03-20 ENCOUNTER — Other Ambulatory Visit: Payer: Self-pay

## 2016-03-20 DIAGNOSIS — E559 Vitamin D deficiency, unspecified: Secondary | ICD-10-CM | POA: Diagnosis not present

## 2016-03-21 ENCOUNTER — Other Ambulatory Visit: Payer: Self-pay | Admitting: Family Medicine

## 2016-03-21 DIAGNOSIS — Z5181 Encounter for therapeutic drug level monitoring: Secondary | ICD-10-CM | POA: Diagnosis not present

## 2016-03-21 DIAGNOSIS — E119 Type 2 diabetes mellitus without complications: Secondary | ICD-10-CM | POA: Diagnosis not present

## 2016-03-21 LAB — VITAMIN D 25 HYDROXY (VIT D DEFICIENCY, FRACTURES): Vit D, 25-Hydroxy: 34.5 ng/mL (ref 30.0–100.0)

## 2016-03-22 ENCOUNTER — Telehealth: Payer: Self-pay | Admitting: Family Medicine

## 2016-03-22 LAB — COMPREHENSIVE METABOLIC PANEL
ALK PHOS: 81 IU/L (ref 39–117)
ALT: 23 IU/L (ref 0–32)
AST: 16 IU/L (ref 0–40)
Albumin/Globulin Ratio: 1.3 (ref 1.2–2.2)
Albumin: 4.2 g/dL (ref 3.5–5.5)
BUN/Creatinine Ratio: 17 (ref 9–23)
BUN: 14 mg/dL (ref 6–24)
Bilirubin Total: 0.4 mg/dL (ref 0.0–1.2)
CO2: 27 mmol/L (ref 18–29)
CREATININE: 0.81 mg/dL (ref 0.57–1.00)
Calcium: 10.1 mg/dL (ref 8.7–10.2)
Chloride: 100 mmol/L (ref 96–106)
GFR calc Af Amer: 99 mL/min/{1.73_m2} (ref >59)
GFR calc non Af Amer: 86 mL/min/{1.73_m2} (ref >59)
GLUCOSE: 150 mg/dL — AB (ref 65–99)
Globulin, Total: 3.2 g/dL (ref 1.5–4.5)
Potassium: 4.7 mmol/L (ref 3.5–5.2)
Sodium: 141 mmol/L (ref 134–144)
Total Protein: 7.4 g/dL (ref 6.0–8.5)

## 2016-03-22 LAB — LIPID PANEL W/O CHOL/HDL RATIO
CHOLESTEROL TOTAL: 181 mg/dL (ref 100–199)
HDL: 47 mg/dL (ref >39)
LDL CALC: 112 mg/dL — AB (ref 0–99)
TRIGLYCERIDES: 109 mg/dL (ref 0–149)
VLDL CHOLESTEROL CAL: 22 mg/dL (ref 5–40)

## 2016-03-22 NOTE — Telephone Encounter (Signed)
I left message; CMP reviewed, glucose 150; kidney function okay; liver function okay; LDL better, though not ideal; vit D normal; call with any questions

## 2016-03-25 ENCOUNTER — Other Ambulatory Visit: Payer: Self-pay

## 2016-03-25 ENCOUNTER — Telehealth: Payer: Self-pay

## 2016-03-25 MED ORDER — ACCU-CHEK FASTCLIX LANCETS MISC: Status: DC

## 2016-03-25 NOTE — Telephone Encounter (Signed)
Left voice mail

## 2016-03-25 NOTE — Telephone Encounter (Signed)
I actually DON'T recommend she test three times day Once a day for the first six months of diagnosis is all that's recommended, and after six months of having diabetes, she doesn't have to check at all

## 2016-03-25 NOTE — Telephone Encounter (Signed)
Pt called and stated that she needs her lancets and her test strips ordered enough for her to be able to test 3 times daily due to the fact that she is not on any medications

## 2016-03-26 ENCOUNTER — Other Ambulatory Visit: Payer: Self-pay

## 2016-03-26 MED ORDER — ACCU-CHEK FASTCLIX LANCETS MISC: Status: DC

## 2016-03-26 MED ORDER — GLUCOSE BLOOD VI STRP: ORAL_STRIP | Status: DC

## 2016-03-29 ENCOUNTER — Telehealth: Payer: Self-pay | Admitting: Family Medicine

## 2016-03-29 NOTE — Telephone Encounter (Signed)
Called patient on 03/29/16 @ 3:11pm per her request but was unable to contact, therefore I  LVM for patient to call back.

## 2016-04-04 DIAGNOSIS — Z79899 Other long term (current) drug therapy: Secondary | ICD-10-CM | POA: Diagnosis not present

## 2016-04-04 DIAGNOSIS — M069 Rheumatoid arthritis, unspecified: Secondary | ICD-10-CM | POA: Diagnosis not present

## 2016-04-09 DIAGNOSIS — F250 Schizoaffective disorder, bipolar type: Secondary | ICD-10-CM | POA: Diagnosis not present

## 2016-05-01 DIAGNOSIS — M25511 Pain in right shoulder: Secondary | ICD-10-CM | POA: Diagnosis not present

## 2016-05-01 DIAGNOSIS — R0789 Other chest pain: Secondary | ICD-10-CM | POA: Diagnosis not present

## 2016-05-01 DIAGNOSIS — M7541 Impingement syndrome of right shoulder: Secondary | ICD-10-CM | POA: Diagnosis not present

## 2016-05-01 DIAGNOSIS — M0609 Rheumatoid arthritis without rheumatoid factor, multiple sites: Secondary | ICD-10-CM | POA: Diagnosis not present

## 2016-05-01 DIAGNOSIS — M79601 Pain in right arm: Secondary | ICD-10-CM | POA: Diagnosis not present

## 2016-05-03 ENCOUNTER — Encounter: Payer: Self-pay | Admitting: Family Medicine

## 2016-05-08 DIAGNOSIS — F250 Schizoaffective disorder, bipolar type: Secondary | ICD-10-CM | POA: Diagnosis not present

## 2016-05-10 ENCOUNTER — Other Ambulatory Visit: Payer: Self-pay | Admitting: Family Medicine

## 2016-05-10 DIAGNOSIS — F250 Schizoaffective disorder, bipolar type: Secondary | ICD-10-CM | POA: Diagnosis not present

## 2016-05-21 DIAGNOSIS — F250 Schizoaffective disorder, bipolar type: Secondary | ICD-10-CM | POA: Diagnosis not present

## 2016-05-27 ENCOUNTER — Encounter: Payer: Self-pay | Admitting: Family Medicine

## 2016-06-07 ENCOUNTER — Other Ambulatory Visit: Payer: Self-pay | Admitting: Family Medicine

## 2016-06-07 NOTE — Telephone Encounter (Signed)
Pt.notified

## 2016-06-07 NOTE — Telephone Encounter (Signed)
Please let patient know that we're going to start to taper this medicine Long-term use can have serious consequences Skip Wednesdays and Sundays for this first month She can take OTC Zantac 150 mg daily and/or TUMS if needed for rebound Avoid triggers, work on weight loss

## 2016-06-10 ENCOUNTER — Ambulatory Visit (INDEPENDENT_AMBULATORY_CARE_PROVIDER_SITE_OTHER): Payer: Medicare Other | Admitting: Family Medicine

## 2016-06-10 ENCOUNTER — Encounter: Payer: Self-pay | Admitting: Family Medicine

## 2016-06-10 VITALS — BP 138/76 | HR 85 | Temp 98.2°F | Resp 16 | Wt 226.1 lb

## 2016-06-10 DIAGNOSIS — M05742 Rheumatoid arthritis with rheumatoid factor of left hand without organ or systems involvement: Secondary | ICD-10-CM

## 2016-06-10 DIAGNOSIS — E119 Type 2 diabetes mellitus without complications: Secondary | ICD-10-CM

## 2016-06-10 DIAGNOSIS — N898 Other specified noninflammatory disorders of vagina: Secondary | ICD-10-CM

## 2016-06-10 DIAGNOSIS — E669 Obesity, unspecified: Secondary | ICD-10-CM

## 2016-06-10 DIAGNOSIS — M05741 Rheumatoid arthritis with rheumatoid factor of right hand without organ or systems involvement: Secondary | ICD-10-CM

## 2016-06-10 NOTE — Assessment & Plan Note (Signed)
Back on MTX through rheumatologist; helping

## 2016-06-10 NOTE — Progress Notes (Signed)
BP 138/76 mmHg  Pulse 85  Temp(Src) 98.2 F (36.8 C) (Oral)  Resp 16  Wt 226 lb 1.6 oz (102.558 kg)  SpO2 98%  LMP 07/09/2014   Subjective:    Patient ID: Elizabeth [name], female    DOB: [birth year], 50 y.o.   MRN: [id number]  HPI: Elizabeth [name] is a 50 y.o. female  Chief Complaint  Patient presents with  . Diabetes    patient checks her blood sugar once a day. highest: 151 & lowest: 109.  Marland Kitchen Hypertension    no negative sx displayed  . Obesity   Patient was seen on March 11, 2016 Type 2 diabetes; A1c had come down from 8.8 to 6.4 without medicine, just hard work on her part Last glucose 150; creatinine 0.81  Last menstrual period August 2015; lots of vaginal dryness; hot flashes; sweats; Dr. Nadine Counts did not put her on any HRT; painful intercourse; no spotting after intercourse, no fevers; s/p BTL; even using KY jelly not helping; can deal with hot flashes; pap smears UTD she says  High cholesterol; last LDL 112; last SGPT 23 in April; started fish oil and TG dropped from 192 to 109; she is interested in a statin; she is taking aspirin daily; worried about risk of heart disease; her ex-husband had diabetes too and he died from blockages; she is worried that she might have blockages  Obesity; she has gained 18 pounds since last visit; stress eating; taking B12; I dissuaded her from taking vit A when she asked if she needed extra of that; she has had some things in her life  Depression screen [facility] 2/9 06/10/2016 03/11/2016 11/29/2015 10/23/2015 07/03/2015  Decreased Interest 0 0 0 0 0  Down, Depressed, Hopeless 3 0 0 0 0  PHQ - 2 Score 3 0 0 0 0  Altered sleeping 3 - - - -  Tired, decreased energy 1 - - - -  Change in appetite 0 - - - -  Feeling bad or failure about yourself  0 - - - -  Trouble concentrating 2 - - - -  Moving slowly or fidgety/restless 0 - - - -  Suicidal thoughts 0 - - - -  PHQ-9 Score 9 - - - -   Relevant past medical, surgical, family and social history  reviewed Past Medical History  Diagnosis Date  . Irritable bowel syndrome   . RA (rheumatoid arthritis) ([place])   . Bipolar 1 disorder ([place])   . Insomnia   . GERD (gastroesophageal reflux disease)   . Diverticulosis   . Motion sickness     boats  . Hepatitis     B - "not active"  . Heart murmur     followed by PCP  . Diabetes mellitus without complication ([place])   . Depression   . Hypertension goal BP (blood pressure) < 130/80 01/17/2016  . Obesity (BMI 30-39.9) 07/03/2015   Past Surgical History  Procedure Laterality Date  . Tubal ligation    . Esophagogastroduodenoscopy (egd) with esophageal dilation    . Cholecystectomy  1997    [place], [place]  . Colonoscopy with propofol N/A 09/07/2015    Procedure: COLONOSCOPY WITH PROPOFOL;  Surgeon: Lucilla Lame, MD;  Location: [place];  Service: Endoscopy;  Laterality: N/A;  . Esophagogastroduodenoscopy (egd) with propofol N/A 09/07/2015    Procedure: ESOPHAGOGASTRODUODENOSCOPY (EGD) WITH PROPOFOL;  Surgeon: Lucilla Lame, MD;  Location: [place];  Service: Endoscopy;  Laterality: N/A;  . Polypectomy N/A  09/07/2015    Procedure: POLYPECTOMY;  Surgeon: Lucilla Lame, MD;  Location: [place];  Service: Endoscopy;  Laterality: N/A;  . Partial colectomy N/A 01/24/2016    Procedure: SIGMOID COLECTOMY;  Surgeon: Florene Glen, MD;  Location: [facility];  Service: General;  Laterality: N/A;  . Bowel resection     Family History  Problem Relation Age of Onset  . Hypertension Mother   . Diabetes Mother   . Arthritis/Rheumatoid Mother   . Alcohol abuse Father   . Esophageal varices Father   . Diverticulosis Maternal Aunt   . Diverticulosis Maternal Grandmother    Social History  Substance Use Topics  . Smoking status: Light Tobacco Smoker -- 0.50 packs/day    Types: Cigarettes    Last Attempt to Quit: 05/06/2015  . Smokeless tobacco: Never Used  . Alcohol Use: No   Interim medical history since last visit  reviewed. Allergies and medications reviewed  Review of Systems Per HPI unless specifically indicated above     Objective:    BP 138/76 mmHg  Pulse 85  Temp(Src) 98.2 F (36.8 C) (Oral)  Resp 16  Wt 226 lb 1.6 oz (102.558 kg)  SpO2 98%  LMP 07/09/2014  Wt Readings from Last 3 Encounters:  06/10/16 226 lb 1.6 oz (102.558 kg)  03/18/16 208 lb (94.348 kg)  03/11/16 204 lb (92.534 kg)   body mass index is 35.4 kg/(m^2).  Physical Exam  Constitutional: She appears well-developed and well-nourished. No distress.  Weight gain noted  HENT:  Head: Normocephalic and atraumatic.  Eyes: EOM are normal. No scleral icterus.  Neck: No thyromegaly present.  Cardiovascular: Normal rate, regular rhythm and normal heart sounds.   Pulmonary/Chest: Effort normal and breath sounds normal. No respiratory distress. She has no wheezes.  Abdominal: Soft. She exhibits no distension.  Musculoskeletal: Normal range of motion. She exhibits no edema.  Neurological: She is alert. She exhibits normal muscle tone.  Skin: Skin is warm and dry. She is not diaphoretic. No pallor.  Psychiatric: She has a normal mood and affect. Her behavior is normal. Judgment and thought content normal.   Diabetic Foot Form - Detailed   Diabetic Foot Exam - detailed  Diabetic Foot exam was performed with the following findings:  Yes 06/10/2016  8:36 AM  Visual Foot Exam completed.:  Yes  Are the toenails ingrown?:  No  Normal Range of Motion:  Yes    Pulse Foot Exam completed.:  Yes  Right Dorsalis Pedis:  Present Left Dorsalis Pedis:  Present  Sensory Foot Exam Completed.:  Yes  Swelling:  No  Semmes-Weinstein Monofilament Test  R Site 1-Great Toe:  Pos L Site 1-Great Toe:  Pos  R Site 4:  Pos L Site 4:  Pos  R Site 5:  Pos L Site 5:  Pos        Results for orders placed or performed in visit on 03/21/16  Comprehensive metabolic panel  Result Value Ref Range   Glucose 150 (H) 65 - 99 mg/dL   BUN 14 6 - 24  mg/dL   Creatinine, Ser 0.81 0.57 - 1.00 mg/dL   GFR calc non Af Amer 86 >59 mL/min/1.73   GFR calc Af Amer 99 >59 mL/min/1.73   BUN/Creatinine Ratio 17 9 - 23   Sodium 141 134 - 144 mmol/L   Potassium 4.7 3.5 - 5.2 mmol/L   Chloride 100 96 - 106 mmol/L   CO2 27 18 - 29 mmol/L   Calcium  10.1 8.7 - 10.2 mg/dL   Total Protein 7.4 6.0 - 8.5 g/dL   Albumin 4.2 3.5 - 5.5 g/dL   Globulin, Total 3.2 1.5 - 4.5 g/dL   Albumin/Globulin Ratio 1.3 1.2 - 2.2   Bilirubin Total 0.4 0.0 - 1.2 mg/dL   Alkaline Phosphatase 81 39 - 117 IU/L   AST 16 0 - 40 IU/L   ALT 23 0 - 32 IU/L  Lipid Panel w/o Chol/HDL Ratio  Result Value Ref Range   Cholesterol, Total 181 100 - 199 mg/dL   Triglycerides 109 0 - 149 mg/dL   HDL 47 >39 mg/dL   VLDL Cholesterol Cal 22 5 - 40 mg/dL   LDL Calculated 112 (H) 0 - 99 mg/dL      Assessment & Plan:   Problem List Items Addressed This Visit      Endocrine   Diabetes mellitus type 2, controlled, without complications ([place]) - Primary    Patient concerned about risk of heart disease; refer to cardiologist at her request; continue aspirin; will hold on estrogen until after heart evaluation; she does not want to start a statin; she'll continue fish oil; goal LDL under 100; work on weight loss as well      Relevant Orders   Ambulatory referral to Cardiology     Musculoskeletal and Integument   Rheumatoid arthritis involving both hands (HCC)    Back on MTX through rheumatologist; helping      Relevant Medications   methotrexate 50 MG/2ML injection     Other   Obesity (BMI 30-39.9)    Encouraged weight loss; see AVS       Other Visit Diagnoses    Vaginal dryness        willing to prescribe vaginal estrogen, but we'll wait until after the cardiac work-up; in the meantime, use lubricants       Follow up plan: Return in about 3 months (around 09/23/2016) for fasting labs and visit with Dr. Sanda Klein.  An after-visit summary was printed and given to the patient  at [place].  Please see the patient instructions which may contain other information and recommendations beyond what is mentioned above in the assessment and plan.  Meds ordered this encounter  Medications  . OLANZapine (ZYPREXA) 15 MG tablet    Sig: Take 15 mg by mouth at bedtime.    Refill:  0  . traZODone (DESYREL) 150 MG tablet    Sig: Take 150 mg by mouth at bedtime.    Refill:  0  . methotrexate 50 MG/2ML injection    Sig: inject 0.6 milliliter subcutaneously every week    Refill:  0  . B-D INS SYR ULTRAFINE 1CC/31G 31G X 5/16" 1 ML MISC    Sig: use as directed TO INJECT METHOTREXATE    Refill:  1  . DISCONTD: traMADol (ULTRAM) 50 MG tablet    Sig: take 1 tablet by mouth every 4 to 6 hours for DENTAL pain    Refill:  0    Orders Placed This Encounter  Procedures  . Ambulatory referral to Cardiology

## 2016-06-10 NOTE — Patient Instructions (Addendum)
Continue your daily aspirin, 81 mg coated We'll have you see the cardiologist for a thorough heart work-up We'll start the vaginal estrogen after he or she clears your heart  Check out the information at familydoctor.org entitled "Nutrition for Weight Loss: What You Need to Know about Fad Diets" Try to lose between 1-2 pounds per week by taking in fewer calories and burning off more calories You can succeed by limiting portions, limiting foods dense in calories and fat, becoming more active, and drinking 8 glasses of water a day (64 ounces) Don't skip meals, especially breakfast, as skipping meals may alter your metabolism Do not use over-the-counter weight loss pills or gimmicks that claim rapid weight loss A healthy BMI (or body mass index) is between 18.5 and 24.9 You can calculate your ideal BMI at the [place] website ClubMonetize.fr  Please do see your eye doctor regularly, and have your eyes examined every year (or more often per his or her recommendation) Check your feet every night and let me know right away of any sores, infections, numbness, etc. Try to limit sweets, white bread, white rice, white potatoes It is okay with me for you to not check your fingerstick blood sugars (per [employer] of Endocrinology Best Practices), unless you are interested and feel it would be helpful for you  Your goal blood pressure is less than 130 mmHg on top. Try to follow the DASH guidelines (DASH stands for Dietary Approaches to Stop Hypertension) Try to limit the sodium in your diet.  Ideally, consume less than 1.5 grams (less than 1,500mg ) per day. Do not add salt when cooking or at the table.  Check the sodium amount on labels when shopping, and choose items lower in sodium when given a choice. Avoid or limit foods that already contain a lot of sodium. Eat a diet rich in fruits and vegetables and whole grains.  DASH Eating Plan DASH stands for  "Dietary Approaches to Stop Hypertension." The DASH eating plan is a healthy eating plan that has been shown to reduce high blood pressure (hypertension). Additional health benefits may include reducing the risk of type 2 diabetes mellitus, heart disease, and stroke. The DASH eating plan may also help with weight loss. WHAT DO I NEED TO KNOW ABOUT THE DASH EATING PLAN? For the DASH eating plan, you will follow these general guidelines:  Choose foods with a percent daily value for sodium of less than 5% (as listed on the food label).  Use salt-free seasonings or herbs instead of table salt or sea salt.  Check with your health care provider or pharmacist before using salt substitutes.  Eat lower-sodium products, often labeled as "lower sodium" or "no salt added."  Eat fresh foods.  Eat more vegetables, fruits, and low-fat dairy products.  Choose whole grains. Look for the word "whole" as the first word in the ingredient list.  Choose fish and skinless chicken or [place] more often than red meat. Limit fish, poultry, and meat to 6 oz (170 g) each day.  Limit sweets, desserts, sugars, and sugary drinks.  Choose heart-healthy fats.  Limit cheese to 1 oz (28 g) per day.  Eat more home-cooked food and less restaurant, buffet, and fast food.  Limit fried foods.  Cook foods using methods other than frying.  Limit canned vegetables. If you do use them, rinse them well to decrease the sodium.  When eating at a restaurant, ask that your food be prepared with less salt, or no salt if possible.  WHAT FOODS CAN I EAT? Seek help from a dietitian for individual calorie needs. Grains Whole grain or whole wheat bread. Brown rice. Whole grain or whole wheat pasta. Quinoa, bulgur, and whole grain cereals. Low-sodium cereals. Corn or whole wheat flour tortillas. Whole grain cornbread. Whole grain crackers. Low-sodium crackers. Vegetables Fresh or frozen vegetables (raw, steamed, roasted, or grilled).  Low-sodium or reduced-sodium tomato and vegetable juices. Low-sodium or reduced-sodium tomato sauce and paste. Low-sodium or reduced-sodium canned vegetables.  Fruits All fresh, canned (in natural juice), or frozen fruits. Meat and Other Protein Products Ground beef (85% or leaner), grass-fed beef, or beef trimmed of fat. Skinless chicken or [place]. Ground chicken or [place]. Pork trimmed of fat. All fish and seafood. Eggs. Dried beans, peas, or lentils. Unsalted nuts and seeds. Unsalted canned beans. Dairy Low-fat dairy products, such as skim or 1% milk, 2% or reduced-fat cheeses, low-fat ricotta or cottage cheese, or plain low-fat yogurt. Low-sodium or reduced-sodium cheeses. Fats and Oils Tub margarines without trans fats. Light or reduced-fat mayonnaise and salad dressings (reduced sodium). Avocado. Safflower, olive, or canola oils. Natural peanut or almond butter. Other Unsalted popcorn and pretzels. The items listed above may not be a complete list of recommended foods or beverages. Contact your dietitian for more options. WHAT FOODS ARE NOT RECOMMENDED? Grains White bread. White pasta. White rice. Refined cornbread. Bagels and croissants. Crackers that contain trans fat. Vegetables Creamed or fried vegetables. Vegetables in a cheese sauce. Regular canned vegetables. Regular canned tomato sauce and paste. Regular tomato and vegetable juices. Fruits Dried fruits. Canned fruit in light or heavy syrup. Fruit juice. Meat and Other Protein Products Fatty cuts of meat. Ribs, chicken wings, bacon, sausage, bologna, salami, chitterlings, fatback, hot dogs, bratwurst, and packaged luncheon meats. Salted nuts and seeds. Canned beans with salt. Dairy Whole or 2% milk, cream, half-and-half, and cream cheese. Whole-fat or sweetened yogurt. Full-fat cheeses or blue cheese. Nondairy creamers and whipped toppings. Processed cheese, cheese spreads, or cheese curds. Condiments Onion and garlic salt,  seasoned salt, table salt, and sea salt. Canned and packaged gravies. Worcestershire sauce. Tartar sauce. Barbecue sauce. Teriyaki sauce. Soy sauce, including reduced sodium. Steak sauce. Fish sauce. Oyster sauce. Cocktail sauce. Horseradish. Ketchup and mustard. Meat flavorings and tenderizers. Bouillon cubes. Hot sauce. Tabasco sauce. Marinades. Taco seasonings. Relishes. Fats and Oils Butter, stick margarine, lard, shortening, ghee, and bacon fat. Coconut, palm kernel, or palm oils. Regular salad dressings. Other Pickles and olives. Salted popcorn and pretzels. The items listed above may not be a complete list of foods and beverages to avoid. Contact your dietitian for more information. WHERE CAN I FIND MORE INFORMATION? National Heart, Lung, and Blood Institute: travelstabloid.com   This information is not intended to replace advice given to you by your health care provider. Make sure you discuss any questions you have with your health care provider.   Document Released: 11/14/2011 Document Revised: 12/16/2014 Document Reviewed: 09/29/2013 Elsevier Interactive Patient Education [employer].

## 2016-06-10 NOTE — Assessment & Plan Note (Addendum)
Patient concerned about risk of heart disease; refer to cardiologist at her request; continue aspirin; will hold on estrogen until after heart evaluation; she does not want to start a statin; she'll continue fish oil; goal LDL under 100; work on weight loss as well

## 2016-06-10 NOTE — Assessment & Plan Note (Signed)
Encouraged weight loss; see AVS 

## 2016-07-05 ENCOUNTER — Encounter: Payer: Self-pay | Admitting: Cardiovascular Disease

## 2016-07-05 ENCOUNTER — Ambulatory Visit (INDEPENDENT_AMBULATORY_CARE_PROVIDER_SITE_OTHER): Payer: Medicare Other | Admitting: Cardiovascular Disease

## 2016-07-05 VITALS — BP 138/78 | HR 75 | Ht 67.0 in | Wt 228.5 lb

## 2016-07-05 DIAGNOSIS — Z136 Encounter for screening for cardiovascular disorders: Secondary | ICD-10-CM | POA: Diagnosis not present

## 2016-07-05 DIAGNOSIS — I10 Essential (primary) hypertension: Secondary | ICD-10-CM | POA: Diagnosis not present

## 2016-07-05 DIAGNOSIS — E785 Hyperlipidemia, unspecified: Secondary | ICD-10-CM

## 2016-07-05 NOTE — Patient Instructions (Signed)
Medication Instructions:  Your physician recommends that you continue on your current medications as directed. Please refer to the Current Medication list given to you today.   Labwork: none  Testing/Procedures: Coronary calcium scoring [place]  Cost is $150 payable at the time of the test.  Monday, July 31 1:00pm   Follow-Up: Your physician recommends that you schedule a follow-up appointment as needed.   Any Other Special Instructions Will Be Listed Below (If Applicable).     If you need a refill on your cardiac medications before your next appointment, please call your pharmacy.

## 2016-07-05 NOTE — Progress Notes (Signed)
Cardiology Office Note   Date:  07/05/2016   ID:  Elizabeth [name], DOB 1966/09/30, MRN [id number]  PCP:  Enid Derry, MD  Cardiologist:   Kathlyn Sacramento, MD   Chief Complaint  Patient presents with  . Other    Ref by Dr. Sanda Klein for cardiac evaluation with history of HTN. Meds reviewed by the patient verbally.       History of Present Illness: Elizabeth [name] is a 50 y.o. female who was referred by Dr. Sanda Klein for evaluation of possible atherosclerotic heart disease given her risk factors and diabetes mellitus. She has no previous cardiac history. She was diagnosed with type 2 diabetes last year and has other chronic medical conditions that include rheumatoid arthritis, borderline hypertension and hyperlipidemia. She is completely asymptomatic and has no chest pain or significant dyspnea. She goes to the gym on a regular basis and exercises on the treadmill at a relatively low speed of 3 miles per hour. She has family history of congestive heart failure but no premature coronary artery disease. She is not a smoker. She has been told in the past about a heart murmur but no history of valvular heart disease.   Past Medical History:  Diagnosis Date  . Bipolar 1 disorder ([place])   . Depression   . Diabetes mellitus without complication ([place])   . Diverticulosis   . GERD (gastroesophageal reflux disease)   . Heart murmur    followed by PCP  . Hepatitis    B - "not active"  . Hypertension goal BP (blood pressure) < 130/80 01/17/2016  . Insomnia   . Irritable bowel syndrome   . Motion sickness    boats  . Obesity (BMI 30-39.9) 07/03/2015  . RA (rheumatoid arthritis) ([place])     Past Surgical History:  Procedure Laterality Date  . BOWEL RESECTION    . CHOLECYSTECTOMY  1997   [place], [place]  . COLONOSCOPY WITH PROPOFOL N/A 09/07/2015   Procedure: COLONOSCOPY WITH PROPOFOL;  Surgeon: Lucilla Lame, MD;  Location: [place];  Service: Endoscopy;  Laterality: N/A;  .  ESOPHAGOGASTRODUODENOSCOPY (EGD) WITH ESOPHAGEAL DILATION    . ESOPHAGOGASTRODUODENOSCOPY (EGD) WITH PROPOFOL N/A 09/07/2015   Procedure: ESOPHAGOGASTRODUODENOSCOPY (EGD) WITH PROPOFOL;  Surgeon: Lucilla Lame, MD;  Location: [place];  Service: Endoscopy;  Laterality: N/A;  . PARTIAL COLECTOMY N/A 01/24/2016   Procedure: SIGMOID COLECTOMY;  Surgeon: Florene Glen, MD;  Location: [facility];  Service: General;  Laterality: N/A;  . POLYPECTOMY N/A 09/07/2015   Procedure: POLYPECTOMY;  Surgeon: Lucilla Lame, MD;  Location: [facility];  Service: Endoscopy;  Laterality: N/A;  . TUBAL LIGATION       Current Outpatient Prescriptions  Medication Sig Dispense Refill  . ACCU-CHEK FASTCLIX LANCETS MISC May check fingerstick blood sugar once a day if desired;  E11.9; bring readings to appt 102 each 0  . aspirin 81 MG tablet Take 81 mg by mouth daily.    . B-D INS SYR ULTRAFINE 1CC/31G 31G X 5/16" 1 ML MISC use as directed TO INJECT METHOTREXATE  1  . folic acid (FOLVITE) 1 MG tablet Take 1 mg by mouth daily.    Marland Kitchen glucose blood test strip Use to check blood sugars once a day 100 each 12  . lansoprazole (PREVACID) 30 MG capsule Start to taper; take one pill daily five days a week; skip Wednesdays and Sundays 22 capsule 0  . Linaclotide (LINZESS) 290 MCG CAPS capsule Take 290 mcg by mouth  daily.    . methotrexate 50 MG/2ML injection inject 0.6 milliliter subcutaneously every week  0  . OLANZapine (ZYPREXA) 15 MG tablet Take 15 mg by mouth at bedtime.  0  . traZODone (DESYREL) 150 MG tablet Take 150 mg by mouth at bedtime.  0  . Vitamin D, Ergocalciferol, (DRISDOL) 50000 units CAPS capsule Take 50,000 Units by mouth every 7 (seven) days.     No current facility-administered medications for this visit.     Allergies:   Ace inhibitors; Enalapril; and Penicillins    Social History:  The patient  reports that she quit smoking about 14 months ago. Her smoking use included Cigarettes. She  smoked 0.50 packs per day. She has never used smokeless tobacco. She reports that she does not drink alcohol or use drugs.   Family History:  The patient's family history includes Alcohol abuse in her father; Arthritis/Rheumatoid in her mother; Diabetes in her mother; Diverticulosis in her maternal aunt and maternal grandmother; Esophageal varices in her father; Hypertension in her mother.    ROS:  Please see the history of present illness.   Otherwise, review of systems are positive for none.   All other systems are reviewed and negative.    PHYSICAL EXAM: VS:  BP 138/78 (BP Location: Left Arm, Patient Position: Sitting, Cuff Size: Normal)   Pulse 75   Ht 5\' 7"  (1.702 m)   Wt 228 lb 8 oz (103.6 kg)   LMP 07/09/2014   BMI 35.79 kg/m  , BMI Body mass index is 35.79 kg/m. GEN: Well nourished, well developed, in no acute distress  HEENT: normal  Neck: no JVD, carotid bruits, or masses Cardiac: RRR; no murmurs, rubs, or gallops,no edema  Respiratory:  clear to auscultation bilaterally, normal work of breathing GI: soft, nontender, nondistended, + BS MS: no deformity or atrophy  Skin: warm and dry, no rash Neuro:  Strength and sensation are intact Psych: euthymic mood, full affect   EKG:  EKG is ordered today. The ekg ordered today demonstrates normal sinus rhythm with no significant ST or T wave changes.  Recent Labs: 09/20/2015: TSH 0.754 02/12/2016: Hemoglobin 11.6; Platelets 365 03/21/2016: ALT 23; BUN 14; Creatinine, Ser 0.81; Potassium 4.7; Sodium 141    Lipid Panel    Component Value Date/Time   CHOL 181 03/21/2016 0806   TRIG 109 03/21/2016 0806   HDL 47 03/21/2016 0806   CHOLHDL 4.7 (H) 09/20/2015 1116   LDLCALC 112 (H) 03/21/2016 0806      Wt Readings from Last 3 Encounters:  07/05/16 228 lb 8 oz (103.6 kg)  06/10/16 226 lb 1.6 oz (102.6 kg)  03/18/16 208 lb (94.3 kg)        ASSESSMENT AND PLAN:  1.  Screening for atherosclerotic cardiovascular disease:  The patient has no previous cardiac history but has multiple risk factors for coronary artery disease including diabetes mellitus. Currently she has no symptoms suggestive of angina and thus stress testing would be of very low diagnostic yield. Also stress test does not exclude atherosclerosis and this was discussed with the patient and based on her risk profile I have recommended coronary calcium screening. If there is evidence of coronary calcifications, she would benefit from more aggressive treatment of risk factors.  2. Borderline hyperlipidemia: I reviewed his lipid profile with her which showed an LDL of 112 and an HDL of 47. Her 10 year atherosclerotic cardiovascular risk is 6.3% below the threshold for starting treatment with a statin. However, if coronary  calcium screening is abnormal, then treatment with a statin would be indicated. I discussed with the patient the importance of lifestyle changes in order to decrease the chance of future coronary artery disease and cardiovascular events. We discussed the importance of controlling risk factors, healthy diet as well as regular exercise.  Disposition:   FU with me as needed.   Signed,  Kathlyn Sacramento, MD  07/05/2016 12:58 PM    [place]

## 2016-07-07 ENCOUNTER — Other Ambulatory Visit: Payer: Self-pay | Admitting: Family Medicine

## 2016-07-08 ENCOUNTER — Inpatient Hospital Stay: Admission: RE | Admit: 2016-07-08 | Payer: Medicare Other | Source: Ambulatory Visit

## 2016-07-16 DIAGNOSIS — Z23 Encounter for immunization: Secondary | ICD-10-CM | POA: Diagnosis not present

## 2016-07-17 ENCOUNTER — Inpatient Hospital Stay: Admission: RE | Admit: 2016-07-17 | Payer: Medicare Other | Source: Ambulatory Visit

## 2016-08-01 DIAGNOSIS — M7061 Trochanteric bursitis, right hip: Secondary | ICD-10-CM | POA: Diagnosis not present

## 2016-08-01 DIAGNOSIS — M7541 Impingement syndrome of right shoulder: Secondary | ICD-10-CM | POA: Diagnosis not present

## 2016-08-01 DIAGNOSIS — M0609 Rheumatoid arthritis without rheumatoid factor, multiple sites: Secondary | ICD-10-CM | POA: Diagnosis not present

## 2016-08-01 DIAGNOSIS — M7062 Trochanteric bursitis, left hip: Secondary | ICD-10-CM | POA: Diagnosis not present

## 2016-08-01 DIAGNOSIS — Z79899 Other long term (current) drug therapy: Secondary | ICD-10-CM | POA: Diagnosis not present

## 2016-08-02 DIAGNOSIS — F250 Schizoaffective disorder, bipolar type: Secondary | ICD-10-CM | POA: Diagnosis not present

## 2016-08-05 ENCOUNTER — Other Ambulatory Visit: Payer: Self-pay | Admitting: Family Medicine

## 2016-09-05 ENCOUNTER — Other Ambulatory Visit: Payer: Self-pay | Admitting: Family Medicine

## 2016-09-09 ENCOUNTER — Encounter: Payer: Self-pay | Admitting: Family Medicine

## 2016-09-09 ENCOUNTER — Other Ambulatory Visit: Payer: Self-pay

## 2016-09-09 ENCOUNTER — Ambulatory Visit (INDEPENDENT_AMBULATORY_CARE_PROVIDER_SITE_OTHER): Payer: Medicare Other | Admitting: Family Medicine

## 2016-09-09 VITALS — BP 132/78 | HR 89 | Temp 98.0°F | Resp 16 | Wt 238.0 lb

## 2016-09-09 DIAGNOSIS — M05742 Rheumatoid arthritis with rheumatoid factor of left hand without organ or systems involvement: Secondary | ICD-10-CM

## 2016-09-09 DIAGNOSIS — E669 Obesity, unspecified: Secondary | ICD-10-CM | POA: Diagnosis not present

## 2016-09-09 DIAGNOSIS — M05741 Rheumatoid arthritis with rheumatoid factor of right hand without organ or systems involvement: Secondary | ICD-10-CM

## 2016-09-09 DIAGNOSIS — I10 Essential (primary) hypertension: Secondary | ICD-10-CM

## 2016-09-09 DIAGNOSIS — Z5181 Encounter for therapeutic drug level monitoring: Secondary | ICD-10-CM

## 2016-09-09 DIAGNOSIS — N6310 Unspecified lump in the right breast, unspecified quadrant: Secondary | ICD-10-CM | POA: Diagnosis not present

## 2016-09-09 DIAGNOSIS — E119 Type 2 diabetes mellitus without complications: Secondary | ICD-10-CM | POA: Diagnosis not present

## 2016-09-09 DIAGNOSIS — Z6839 Body mass index (BMI) 39.0-39.9, adult: Secondary | ICD-10-CM

## 2016-09-09 DIAGNOSIS — R635 Abnormal weight gain: Secondary | ICD-10-CM | POA: Diagnosis not present

## 2016-09-09 DIAGNOSIS — E6609 Other obesity due to excess calories: Secondary | ICD-10-CM

## 2016-09-09 LAB — LIPID PANEL
Cholesterol: 189 mg/dL (ref 125–200)
HDL: 40 mg/dL — ABNORMAL LOW (ref >=46)
LDL CALC: 119 mg/dL (ref <130)
TRIGLYCERIDES: 152 mg/dL — AB (ref <150)
Total CHOL/HDL Ratio: 4.7 Ratio (ref <=5.0)
VLDL: 30 mg/dL (ref <30)

## 2016-09-09 LAB — COMPLETE METABOLIC PANEL WITH GFR
ALK PHOS: 95 U/L (ref 33–115)
ALT: 22 U/L (ref 6–29)
AST: 17 U/L (ref 10–35)
Albumin: 4 g/dL (ref 3.6–5.1)
BILIRUBIN TOTAL: 0.4 mg/dL (ref 0.2–1.2)
BUN: 12 mg/dL (ref 7–25)
CHLORIDE: 104 mmol/L (ref 98–110)
CO2: 26 mmol/L (ref 20–31)
CREATININE: 0.81 mg/dL (ref 0.50–1.10)
Calcium: 9.8 mg/dL (ref 8.6–10.2)
GFR, EST NON AFRICAN AMERICAN: 86 mL/min (ref >=60)
Glucose, Bld: 132 mg/dL — ABNORMAL HIGH (ref 65–99)
Potassium: 4.1 mmol/L (ref 3.5–5.3)
Sodium: 138 mmol/L (ref 135–146)
Total Protein: 7.1 g/dL (ref 6.1–8.1)

## 2016-09-09 LAB — TSH: TSH: 1.53 m[IU]/L

## 2016-09-09 MED ORDER — LINACLOTIDE 290 MCG PO CAPS: 290.0000 ug | ORAL_CAPSULE | Freq: Every day | ORAL | 1 refills | Status: DC

## 2016-09-09 NOTE — Progress Notes (Signed)
BP 132/78   Pulse 89   Temp 98 F (36.7 C) (Oral)   Resp 16   Wt 238 lb (108 kg)   LMP 07/09/2014   SpO2 97%   BMI 37.28 kg/m    Subjective:    Patient ID: Elizabeth [name], female    DOB: 05/28/1966, 50 y.o.   MRN: [id number]  HPI: Elizabeth [name] is a 50 y.o. female  Chief Complaint  Patient presents with  . Follow-up   No medical excitement Here for follow-up She went to the heart doctor but they want to do a stress test; she does not have $150 to get this done and wonders if there is something she can do (I'll direct her to office manager to talk to finance dept)  Type 2 diabetes; A1c came down; checking FSBS a few times a week; around 150 fasting; no low sugars; highest sugar in the last month of 157; BS never goes over 160; she eats some white bread, less than before; no sugary drinks; does eat ice cream  Obesity; has gained weight; she goes to the gym, but skips meals; does eat late at night; husband works night shift, gets to bed at 2 or 3 am; drinking plenty of water; no alcohol; does eat candy, chips  HTN; fair control; intolerant to ACE-inhibitors  High cholesterol; last LDL 112; not much fatty meat  Depression; on trazodone and zyprexa, Dr. Randel Books; no thoughts of SI, HI  RA; on methotrexate, Dr. Meda Coffee rheumatologist manages that  Depression screen [facility] 2/9 09/09/2016 06/10/2016 03/11/2016 11/29/2015 10/23/2015  Decreased Interest 0 0 0 0 0  Down, Depressed, Hopeless 0 3 0 0 0  PHQ - 2 Score 0 3 0 0 0  Altered sleeping - 3 - - -  Tired, decreased energy - 1 - - -  Change in appetite - 0 - - -  Feeling bad or failure about yourself  - 0 - - -  Trouble concentrating - 2 - - -  Moving slowly or fidgety/restless - 0 - - -  Suicidal thoughts - 0 - - -  PHQ-9 Score - 9 - - -   Relevant past medical, surgical, family and social history reviewed Past Medical History:  Diagnosis Date  . Bipolar 1 disorder ([place])   . Depression   . Diabetes mellitus without  complication ([place])   . Diverticulosis   . GERD (gastroesophageal reflux disease)   . Heart murmur    followed by PCP  . Hepatitis    B - "not active"  . Hypertension goal BP (blood pressure) < 130/80 01/17/2016  . Insomnia   . Irritable bowel syndrome   . Motion sickness    boats  . Obesity (BMI 30-39.9) 07/03/2015  . RA (rheumatoid arthritis) ([place])    Past Surgical History:  Procedure Laterality Date  . BOWEL RESECTION    . CHOLECYSTECTOMY  1997   [place], [place]  . COLONOSCOPY WITH PROPOFOL N/A 09/07/2015   Procedure: COLONOSCOPY WITH PROPOFOL;  Surgeon: Lucilla Lame, MD;  Location: [place];  Service: Endoscopy;  Laterality: N/A;  . ESOPHAGOGASTRODUODENOSCOPY (EGD) WITH ESOPHAGEAL DILATION    . ESOPHAGOGASTRODUODENOSCOPY (EGD) WITH PROPOFOL N/A 09/07/2015   Procedure: ESOPHAGOGASTRODUODENOSCOPY (EGD) WITH PROPOFOL;  Surgeon: Lucilla Lame, MD;  Location: [place];  Service: Endoscopy;  Laterality: N/A;  . PARTIAL COLECTOMY N/A 01/24/2016   Procedure: SIGMOID COLECTOMY;  Surgeon: Florene Glen, MD;  Location: [facility];  Service: General;  Laterality: N/A;  .  POLYPECTOMY N/A 09/07/2015   Procedure: POLYPECTOMY;  Surgeon: Lucilla Lame, MD;  Location: [place];  Service: Endoscopy;  Laterality: N/A;  . TUBAL LIGATION     Family History  Problem Relation Age of Onset  . Hypertension Mother   . Diabetes Mother   . Arthritis/Rheumatoid Mother   . Alcohol abuse Father   . Esophageal varices Father   . Diverticulosis Maternal Aunt   . Diverticulosis Maternal Grandmother    Social History  Substance Use Topics  . Smoking status: Former Smoker    Packs/day: 0.50    Types: Cigarettes    Quit date: 05/06/2015  . Smokeless tobacco: Never Used  . Alcohol use No    Interim medical history since last visit reviewed. Allergies and medications reviewed  Review of Systems Per HPI unless specifically indicated above     Objective:    BP 132/78   Pulse  89   Temp 98 F (36.7 C) (Oral)   Resp 16   Wt 238 lb (108 kg)   LMP 07/09/2014   SpO2 97%   BMI 37.28 kg/m   Wt Readings from Last 3 Encounters:  09/09/16 238 lb (108 kg)  07/05/16 228 lb 8 oz (103.6 kg)  06/10/16 226 lb 1.6 oz (102.6 kg)    Physical Exam  Constitutional: She appears well-developed and well-nourished. No distress.  Obese; weight up almost 12 pounds since last visit 3 months ago  HENT:  Head: Normocephalic and atraumatic.  Eyes: EOM are normal. No scleral icterus.  Neck: No thyromegaly present.  Cardiovascular: Normal rate, regular rhythm and normal heart sounds.   No murmur heard. Pulmonary/Chest: Effort normal and breath sounds normal. No respiratory distress. She has no wheezes.  Abdominal: Soft. Bowel sounds are normal. She exhibits no distension.  Musculoskeletal: Normal range of motion. She exhibits no edema.  Neurological: She is alert. She exhibits normal muscle tone.  Skin: Skin is warm and dry. She is not diaphoretic. No pallor.  Psychiatric: She has a normal mood and affect. Her behavior is normal. Judgment and thought content normal. Her mood appears not anxious. She does not exhibit a depressed mood.   Diabetic Foot Form - Detailed   Diabetic Foot Exam - detailed Diabetic Foot exam was performed with the following findings:  Yes 09/09/2016  9:16 AM  Visual Foot Exam completed.:  Yes  Are the toenails ingrown?:  No Normal Range of Motion:  Yes Pulse Foot Exam completed.:  Yes  Right Dorsalis Pedis:  Present Left Dorsalis Pedis:  Present  Sensory Foot Exam Completed.:  Yes Semmes-Weinstein Monofilament Test R Site 1-Great Toe:  Pos L Site 1-Great Toe:  Pos  R Site 4:  Pos L Site 4:  Pos  R Site 5:  Pos L Site 5:  Pos       Results for orders placed or performed in visit on 03/21/16  Comprehensive metabolic panel  Result Value Ref Range   Glucose 150 (H) 65 - 99 mg/dL   BUN 14 6 - 24 mg/dL   Creatinine, Ser 0.81 0.57 - 1.00 mg/dL   GFR  calc non Af Amer 86 >59 mL/min/1.73   GFR calc Af Amer 99 >59 mL/min/1.73   BUN/Creatinine Ratio 17 9 - 23   Sodium 141 134 - 144 mmol/L   Potassium 4.7 3.5 - 5.2 mmol/L   Chloride 100 96 - 106 mmol/L   CO2 27 18 - 29 mmol/L   Calcium 10.1 8.7 - 10.2 mg/dL  Total Protein 7.4 6.0 - 8.5 g/dL   Albumin 4.2 3.5 - 5.5 g/dL   Globulin, Total 3.2 1.5 - 4.5 g/dL   Albumin/Globulin Ratio 1.3 1.2 - 2.2   Bilirubin Total 0.4 0.0 - 1.2 mg/dL   Alkaline Phosphatase 81 39 - 117 IU/L   AST 16 0 - 40 IU/L   ALT 23 0 - 32 IU/L  Lipid Panel w/o Chol/HDL Ratio  Result Value Ref Range   Cholesterol, Total 181 100 - 199 mg/dL   Triglycerides 109 0 - 149 mg/dL   HDL 47 >39 mg/dL   VLDL Cholesterol Cal 22 5 - 40 mg/dL   LDL Calculated 112 (H) 0 - 99 mg/dL      Assessment & Plan:   Problem List Items Addressed This Visit      Cardiovascular and Mediastinum   Hypertension goal BP (blood pressure) < 130/80 (Chronic)    Almost to goal; try DASH guidelines; intolerant to ACE-inhibitors; weight loss        Endocrine   Diabetes mellitus type 2, controlled, without complications (HCC) - Primary (Chronic)    Foot exam by MD; urine microalbumin:creatinine UTD; encouraged weight loss, check A1c today      Relevant Orders   Hemoglobin A1c   Lipid panel     Musculoskeletal and Integument   Rheumatoid arthritis involving both hands (HCC) (Chronic)    Followed by rheumatologist        Other   Obesity (BMI 30-39.9) (Chronic)    See AVS; encouragement given      Medication monitoring encounter    Check labs      Relevant Orders   COMPLETE METABOLIC PANEL WITH GFR   Breast mass, right    Cyst, next mammogram due in December      Abnormal weight gain    Check TSH, encouraged weight loss      Relevant Orders   TSH    Other Visit Diagnoses   None.     Follow up plan: Return in about 3 months (around 12/10/2016) for diabetes, weight loss.  An after-visit summary was printed and given  to the patient at [facility].  Please see the patient instructions which may contain other information and recommendations beyond what is mentioned above in the assessment and plan.  Orders Placed This Encounter  Procedures  . Hemoglobin A1c  . Lipid panel  . COMPLETE METABOLIC PANEL WITH GFR  . TSH

## 2016-09-09 NOTE — Telephone Encounter (Signed)
Previous provided Rxd this; approved

## 2016-09-09 NOTE — Assessment & Plan Note (Signed)
Almost to goal; try DASH guidelines; intolerant to ACE-inhibitors; weight loss

## 2016-09-09 NOTE — Assessment & Plan Note (Signed)
Foot exam by MD; urine microalbumin:creatinine UTD; encouraged weight loss, check A1c today

## 2016-09-09 NOTE — Assessment & Plan Note (Signed)
Check labs 

## 2016-09-09 NOTE — Assessment & Plan Note (Signed)
Cyst, next mammogram due in December

## 2016-09-09 NOTE — Assessment & Plan Note (Signed)
Check TSH, encouraged weight loss

## 2016-09-09 NOTE — Assessment & Plan Note (Signed)
See AVS; encouragement given

## 2016-09-09 NOTE — Patient Instructions (Signed)
Check out the information at familydoctor.org entitled "Nutrition for Weight Loss: What You Need to Know about Fad Diets" Try to lose between 1-2 pounds per week by taking in fewer calories and burning off more calories You can succeed by limiting portions, limiting foods dense in calories and fat, becoming more active, and drinking 8 glasses of water a day (64 ounces) Don't skip meals, especially breakfast, as skipping meals may alter your metabolism Do not use over-the-counter weight loss pills or gimmicks that claim rapid weight loss A healthy BMI (or body mass index) is between 18.5 and 24.9 You can calculate your ideal BMI at the [place] website ClubMonetize.fr  Try to limit saturated fats in your diet (bologna, hot dogs, barbeque, cheeseburgers, hamburgers, steak, bacon, sausage, cheese, etc.) and get more fresh fruits, vegetables, and whole grains  Your goal blood pressure is less than 130 mmHg on top. Try to follow the DASH guidelines (DASH stands for Dietary Approaches to Stop Hypertension) Try to limit the sodium in your diet.  Ideally, consume less than 1.5 grams (less than 1,500mg ) per day. Do not add salt when cooking or at the table.  Check the sodium amount on labels when shopping, and choose items lower in sodium when given a choice. Avoid or limit foods that already contain a lot of sodium. Eat a diet rich in fruits and vegetables and whole grains.

## 2016-09-09 NOTE — Assessment & Plan Note (Signed)
Followed by rheumatologist

## 2016-09-10 ENCOUNTER — Telehealth: Payer: Self-pay | Admitting: Family Medicine

## 2016-09-10 LAB — HEMOGLOBIN A1C
Hgb A1c MFr Bld: 7.5 % — ABNORMAL HIGH (ref <5.7)
Mean Plasma Glucose: 169 mg/dL

## 2016-09-10 NOTE — Telephone Encounter (Signed)
Already been done sent in yesterday

## 2016-09-10 NOTE — Telephone Encounter (Signed)
Pt is needing refill on linzess. Pharm is [facility] on [address]

## 2016-09-13 ENCOUNTER — Emergency Department
Admission: EM | Admit: 2016-09-13 | Discharge: 2016-09-13 | Disposition: A | Discharge status: Home / Self Care | Payer: Medicare Other | Attending: Emergency Medicine | Admitting: Emergency Medicine

## 2016-09-13 DIAGNOSIS — E119 Type 2 diabetes mellitus without complications: Secondary | ICD-10-CM | POA: Insufficient documentation

## 2016-09-13 DIAGNOSIS — Z79899 Other long term (current) drug therapy: Secondary | ICD-10-CM | POA: Diagnosis not present

## 2016-09-13 DIAGNOSIS — F319 Bipolar disorder, unspecified: Secondary | ICD-10-CM | POA: Diagnosis not present

## 2016-09-13 DIAGNOSIS — Z7982 Long term (current) use of aspirin: Secondary | ICD-10-CM | POA: Insufficient documentation

## 2016-09-13 DIAGNOSIS — I10 Essential (primary) hypertension: Secondary | ICD-10-CM | POA: Insufficient documentation

## 2016-09-13 DIAGNOSIS — T783XXA Angioneurotic edema, initial encounter: Secondary | ICD-10-CM

## 2016-09-13 DIAGNOSIS — Z87891 Personal history of nicotine dependence: Secondary | ICD-10-CM | POA: Diagnosis not present

## 2016-09-13 MED ORDER — METHYLPREDNISOLONE 4 MG PO TBPK: ORAL_TABLET | ORAL | 0 refills | Status: DC

## 2016-09-13 MED ORDER — DIPHENHYDRAMINE HCL 50 MG/ML IJ SOLN
25.0000 mg | Freq: Once | INTRAMUSCULAR | Status: AC
  Administered 2016-09-13: 25 mg via INTRAVENOUS
  Filled 2016-09-13: qty 1

## 2016-09-13 MED ORDER — DEXAMETHASONE SODIUM PHOSPHATE 10 MG/ML IJ SOLN
10.0000 mg | Freq: Once | INTRAMUSCULAR | Status: AC
  Administered 2016-09-13: 10 mg via INTRAVENOUS
  Filled 2016-09-13: qty 1

## 2016-09-13 MED ORDER — ERYTHROMYCIN BASE 500 MG PO TABS: 500.0000 mg | ORAL_TABLET | Freq: Four times a day (QID) | ORAL | 0 refills | Status: AC

## 2016-09-13 MED ORDER — SODIUM CHLORIDE 0.9 % IV BOLUS (SEPSIS)
500.0000 mL | Freq: Once | INTRAVENOUS | Status: AC
  Administered 2016-09-13: 500 mL via INTRAVENOUS

## 2016-09-13 MED ORDER — METOCLOPRAMIDE HCL 5 MG/ML IJ SOLN
20.0000 mg | Freq: Once | INTRAVENOUS | Status: AC
  Administered 2016-09-13: 20 mg via INTRAVENOUS
  Filled 2016-09-13: qty 4

## 2016-09-13 MED ORDER — HYDROXYZINE HCL 50 MG PO TABS: 50.0000 mg | ORAL_TABLET | Freq: Three times a day (TID) | ORAL | 0 refills | Status: DC | PRN

## 2016-09-13 NOTE — ED Notes (Signed)
Pharmacy notified to send IV Reglan

## 2016-09-13 NOTE — ED Triage Notes (Signed)
Pt had tooth pulled yesterday and was put on antibiotics yesterday. States yesterday noted swelling below left eye and left facial swelling and jaw pain. No swelling noted inside of mouth or throat.

## 2016-09-13 NOTE — ED Provider Notes (Signed)
[facility] Emergency Department Provider Note   ____________________________________________   First MD Initiated Contact with Patient 09/13/16 2158     (approximate)  I have reviewed the triage vital signs and the nursing notes.   HISTORY  Chief Complaint Angioedema    HPI Elizabeth [name] is a 50 y.o. female patient complain of facial and lip edema started yesterday. Patient states she had teeth pulled yesterday and was started on clindamycin due to her allergy to penicillin. Her last doses of this antibiotic was 8:30 tonight. Patient states she started noticing upper lip edema it radiate to her jaw. Patient denies any tongue swelling or dyspnea. Patient stated itching but no other  signs and symptoms. Patient rates her pain as 8/10. Patient described her pain as "a tightness". No palliative measures for her complaint. Past Medical History:  Diagnosis Date  . Bipolar 1 disorder ([place])   . Depression   . Diabetes mellitus without complication ([place])   . Diverticulosis   . GERD (gastroesophageal reflux disease)   . Heart murmur    followed by PCP  . Hepatitis    B - "not active"  . Hypertension goal BP (blood pressure) < 130/80 01/17/2016  . Insomnia   . Irritable bowel syndrome   . Motion sickness    boats  . Obesity (BMI 30-39.9) 07/03/2015  . RA (rheumatoid arthritis) [facility])     Patient Active Problem List   Diagnosis Date Noted  . Abnormal weight gain 09/09/2016  . Medication monitoring encounter 03/16/2016  . Vitamin D deficiency 03/11/2016  . Wound healing, delayed 02/16/2016  . Diverticula of colon 01/24/2016  . Hypertension goal BP (blood pressure) < 130/80 01/17/2016  . Impingement syndrome of right shoulder 01/03/2016  . Bursitis, trochanteric 11/22/2015  . Breast mass, right 11/08/2015  . Inversion, nipple 11/08/2015  . Diabetes mellitus type 2, controlled, without complications ([place]) [id number]  . Rheumatoid arthritis involving both  hands ([place]) 10/23/2015  . Benign neoplasm of descending colon   . Benign neoplasm of sigmoid colon   . Bipolar disorder, current episode depressed, moderate ([place]) 07/03/2015  . IBS (irritable bowel syndrome) 07/03/2015  . Insomnia, uncontrolled 07/03/2015  . GERD without esophagitis 07/03/2015  . Obesity (BMI 30-39.9) 07/03/2015  . Bilateral hip bursitis 07/03/2015    Past Surgical History:  Procedure Laterality Date  . BOWEL RESECTION    . CHOLECYSTECTOMY  1997   [place], [place]  . COLONOSCOPY WITH PROPOFOL N/A 09/07/2015   Procedure: COLONOSCOPY WITH PROPOFOL;  Surgeon: Lucilla Lame, MD;  Location: [place];  Service: Endoscopy;  Laterality: N/A;  . ESOPHAGOGASTRODUODENOSCOPY (EGD) WITH ESOPHAGEAL DILATION    . ESOPHAGOGASTRODUODENOSCOPY (EGD) WITH PROPOFOL N/A 09/07/2015   Procedure: ESOPHAGOGASTRODUODENOSCOPY (EGD) WITH PROPOFOL;  Surgeon: Lucilla Lame, MD;  Location: [place];  Service: Endoscopy;  Laterality: N/A;  . PARTIAL COLECTOMY N/A 01/24/2016   Procedure: SIGMOID COLECTOMY;  Surgeon: Florene Glen, MD;  Location: [facility];  Service: General;  Laterality: N/A;  . POLYPECTOMY N/A 09/07/2015   Procedure: POLYPECTOMY;  Surgeon: Lucilla Lame, MD;  Location: [place];  Service: Endoscopy;  Laterality: N/A;  . TUBAL LIGATION      Prior to Admission medications   Medication Sig Start Date End Date Taking? Authorizing Provider  ACCU-CHEK FASTCLIX LANCETS [place] May check fingerstick blood sugar once a day if desired;  E11.9; bring readings to appt 03/26/16   Ashok Norris, MD  aspirin 81 MG tablet Take 81 mg by mouth  daily.    Historical Provider, MD  B-D INS SYR ULTRAFINE 1CC/31G 31G X 5/16" 1 ML MISC use as directed TO INJECT METHOTREXATE 05/02/16   Historical Provider, MD  erythromycin base (E-MYCIN) 500 MG tablet Take 1 tablet (500 mg total) by mouth 4 (four) times daily. 09/13/16 09/23/16  Sable Feil, PA-C  folic acid (FOLVITE) 1 MG tablet Take 1  mg by mouth daily.    Historical Provider, MD  glucose blood test strip Use to check blood sugars once a day 03/26/16   Ashok Norris, MD  hydrOXYzine (ATARAX/VISTARIL) 50 MG tablet Take 1 tablet (50 mg total) by mouth 3 (three) times daily as needed for itching. 09/13/16   Sable Feil, PA-C  lansoprazole (PREVACID) 30 MG capsule One by mouth every THIRD day for one month, then stop 09/05/16   Arnetha Courser, MD  linaclotide (LINZESS) 290 MCG CAPS capsule Take 1 capsule (290 mcg total) by mouth daily. 09/09/16   Arnetha Courser, MD  methotrexate 50 MG/2ML injection inject 0.6 milliliter subcutaneously every week 05/02/16   Historical Provider, MD  methylPREDNISolone (MEDROL DOSEPAK) 4 MG TBPK tablet Take Tapered dose as directed 09/13/16   Sable Feil, PA-C  OLANZapine (ZYPREXA) 15 MG tablet Take 15 mg by mouth at bedtime. 06/08/16   Historical Provider, MD  traZODone (DESYREL) 150 MG tablet Take 150 mg by mouth at bedtime. 05/30/16   Historical Provider, MD  Vitamin D, Ergocalciferol, (DRISDOL) 50000 units CAPS capsule Take 50,000 Units by mouth every 7 (seven) days.    Historical Provider, MD    Allergies Ace inhibitors; Enalapril; and Penicillins  Family History  Problem Relation Age of Onset  . Hypertension Mother   . Diabetes Mother   . Arthritis/Rheumatoid Mother   . Alcohol abuse Father   . Esophageal varices Father   . Diverticulosis Maternal Aunt   . Diverticulosis Maternal Grandmother     Social History Social History  Substance Use Topics  . Smoking status: Former Smoker    Packs/day: 0.50    Types: Cigarettes    Quit date: 05/06/2015  . Smokeless tobacco: Never Used  . Alcohol use No    Review of Systems Constitutional: No fever/chills Eyes: No visual changes. ENT: No sore throat. Cardiovascular: Denies chest pain. Respiratory: Denies shortness of breath. Gastrointestinal: No abdominal pain.  No nausea, no vomiting.  No diarrhea.  No constipation. Genitourinary:  Negative for dysuria. Musculoskeletal: Negative for back pain. Skin: Negative for rash. Edema to the upper lip Neurological: Negative for headaches, focal weakness or numbness. Psychiatric:Depression and bipolar Endocrine:Diabetes and hypertension Hematological/Lymphatic: Allergic/Immunilogical:  Rheumatoid arthritis. Allergic to Ace inhibitors and penicillin.   ____________________________________________   PHYSICAL EXAM:  VITAL SIGNS: ED Triage Vitals  Enc Vitals Group     BP 09/13/16 2125 (!) 147/85     Pulse Rate 09/13/16 2125 84     Resp 09/13/16 2125 20     Temp 09/13/16 2125 97.8 F (36.6 C)     Temp Source 09/13/16 2125 Oral     SpO2 09/13/16 2125 100 %     Weight 09/13/16 2126 238 lb (108 kg)     Height 09/13/16 2126 5\' 7"  (1.702 m)     Head Circumference --      Peak Flow --      Pain Score 09/13/16 2132 8     Pain Loc --      Pain Edu? --      Excl. in [place]? --  Constitutional: Alert and oriented. Well appearing and in no acute distress. obesity Eyes: Conjunctivae are normal. PERRL. EOMI. Head: Atraumatic. Nose: No congestion/rhinnorhea. Mouth/Throat: Mucous membranes are moist.  Oropharynx non-erythematous. Multiple dental caries and extraction of upper left incisor. Neck: No stridor.  No cervical spine tenderness to palpation. Hematological/Lymphatic/Immunilogical: No cervical lymphadenopathy. Cardiovascular: Normal rate, regular rhythm. Grossly normal heart sounds.  Good peripheral circulation. Respiratory: Normal respiratory effort.  No retractions. Lungs CTAB. Gastrointestinal: Soft and nontender. No distention. No abdominal bruits. No CVA tenderness. Musculoskeletal: No lower extremity tenderness nor edema.  No joint effusions. Neurologic:  Normal speech and language. No gross focal neurologic deficits are appreciated. No gait instability. Skin:  Skin is warm, dry and intact. No rash noted. Upper lip edema. Psychiatric: Mood and affect are normal.  Speech and behavior are normal.  ____________________________________________   LABS (all labs ordered are listed, but only abnormal results are displayed)  Labs Reviewed - No data to display ____________________________________________  EKG   ____________________________________________  RADIOLOGY   ____________________________________________   PROCEDURES  Procedure(s) performed: None  Procedures  Critical Care performed: No  ____________________________________________   INITIAL IMPRESSION / ASSESSMENT AND PLAN / ED COURSE  Pertinent labs & imaging results that were available during my care of the patient were reviewed by me and considered in my medical decision making (see chart for details). Medications reaction resulting angioedema upper lip and facial area. Patient advised to discontinue clindamycin. Patient given a prescription for erythromycin. Patient also given a prescription for Atarax.  Clinical Course  Status post IV Decadron, Benadryl, and Reglan patient reports upper lip is no longer tense  ____________________________________________   FINAL CLINICAL IMPRESSION(S) / ED DIAGNOSES  Final diagnoses:  Allergic angioedema, initial encounter      NEW MEDICATIONS STARTED DURING THIS VISIT:  New Prescriptions   ERYTHROMYCIN BASE (E-MYCIN) 500 MG TABLET    Take 1 tablet (500 mg total) by mouth 4 (four) times daily.   HYDROXYZINE (ATARAX/VISTARIL) 50 MG TABLET    Take 1 tablet (50 mg total) by mouth 3 (three) times daily as needed for itching.   METHYLPREDNISOLONE (MEDROL DOSEPAK) 4 MG TBPK TABLET    Take Tapered dose as directed     Note:  This document was prepared using Dragon voice recognition software and may include unintentional dictation errors.    Sable Feil, PA-C 09/13/16 [place], MD 09/13/16 [id number]

## 2016-09-13 NOTE — Discharge Instructions (Signed)
Advised to discontinue clindamycin. Advised to follow-up with treating Dentist and advised him of the ER visit.

## 2016-09-13 NOTE — ED Notes (Signed)
Pt in via triage with complaints of facial swelling since last night.  Pt reports being started on clindamycin for a periorbital abscess yesterday, having taken three doses at this time.  Pt reports swelling has become worse over the course of the day.  Pt with hx of angioedema rxn to ace inhibitor in March.  Significant swelling noted to left mouth, face, and up around left eye.  Swelling noted to tongue as well.  Pt denies difficulty breathing, pt able to speak full sentences, no distress noted at this time.

## 2016-10-04 ENCOUNTER — Telehealth: Payer: Self-pay | Admitting: Family Medicine

## 2016-10-04 DIAGNOSIS — Z5181 Encounter for therapeutic drug level monitoring: Secondary | ICD-10-CM

## 2016-10-04 DIAGNOSIS — E785 Hyperlipidemia, unspecified: Secondary | ICD-10-CM

## 2016-10-04 DIAGNOSIS — E1169 Type 2 diabetes mellitus with other specified complication: Secondary | ICD-10-CM | POA: Insufficient documentation

## 2016-10-04 MED ORDER — ATORVASTATIN CALCIUM 10 MG PO TABS: 10.0000 mg | ORAL_TABLET | Freq: Every day | ORAL | 1 refills | Status: DC

## 2016-10-04 NOTE — Assessment & Plan Note (Signed)
Recheck labs in 8 weeks

## 2016-10-04 NOTE — Telephone Encounter (Signed)
Discussed labs with patient; she does not want med for DM; wants to try diet and exercise; she'll try statin though; discussed possible side effects; recheck labs fasting in 8 weeks; orders entered

## 2016-10-04 NOTE — Assessment & Plan Note (Signed)
Start statin, recheck labs in 8 weeks

## 2016-10-05 ENCOUNTER — Other Ambulatory Visit: Payer: Self-pay | Admitting: Family Medicine

## 2016-10-17 ENCOUNTER — Other Ambulatory Visit: Payer: Self-pay | Admitting: Family Medicine

## 2016-10-17 DIAGNOSIS — Z1231 Encounter for screening mammogram for malignant neoplasm of breast: Secondary | ICD-10-CM

## 2016-10-23 DIAGNOSIS — M0609 Rheumatoid arthritis without rheumatoid factor, multiple sites: Secondary | ICD-10-CM | POA: Diagnosis not present

## 2016-10-23 DIAGNOSIS — Z79899 Other long term (current) drug therapy: Secondary | ICD-10-CM | POA: Diagnosis not present

## 2016-11-04 ENCOUNTER — Telehealth: Payer: Self-pay

## 2016-11-04 ENCOUNTER — Other Ambulatory Visit: Payer: Self-pay | Admitting: Family Medicine

## 2016-11-04 NOTE — Telephone Encounter (Signed)
Patient called stating that she needed the Medicare Part B Detailed Written Order completed and faxed back to [facility] as soon as possible.  Patient stated that her HgA1C was 7.5mg  and that she is working to improve it along with her cholesterol.  I contacted [facility] to see if they could fax [place] another form since the other one could not be found.

## 2016-11-07 ENCOUNTER — Other Ambulatory Visit: Payer: Self-pay | Admitting: Family Medicine

## 2016-11-19 DIAGNOSIS — F250 Schizoaffective disorder, bipolar type: Secondary | ICD-10-CM | POA: Diagnosis not present

## 2016-11-27 DIAGNOSIS — M069 Rheumatoid arthritis, unspecified: Secondary | ICD-10-CM | POA: Diagnosis not present

## 2016-11-27 DIAGNOSIS — Z79899 Other long term (current) drug therapy: Secondary | ICD-10-CM | POA: Diagnosis not present

## 2016-11-27 DIAGNOSIS — M0609 Rheumatoid arthritis without rheumatoid factor, multiple sites: Secondary | ICD-10-CM | POA: Diagnosis not present

## 2016-12-03 ENCOUNTER — Ambulatory Visit
Admission: RE | Admit: 2016-12-03 | Discharge: 2016-12-03 | Disposition: A | Discharge status: Home / Self Care | Payer: Medicare Other | Source: Ambulatory Visit | Attending: Family Medicine | Admitting: Family Medicine

## 2016-12-03 DIAGNOSIS — Z1231 Encounter for screening mammogram for malignant neoplasm of breast: Secondary | ICD-10-CM | POA: Diagnosis not present

## 2016-12-04 ENCOUNTER — Other Ambulatory Visit: Payer: Self-pay | Admitting: Family Medicine

## 2016-12-07 ENCOUNTER — Other Ambulatory Visit: Payer: Self-pay | Admitting: Family Medicine

## 2016-12-10 ENCOUNTER — Other Ambulatory Visit: Payer: Self-pay | Admitting: Family Medicine

## 2016-12-11 NOTE — Telephone Encounter (Signed)
Duplicate message already addressed. 

## 2016-12-11 NOTE — Telephone Encounter (Signed)
Glitch in our system; I was not receiving electronic refill requests from Dec 13 until yesterday; addressed with IT; addressing now ------------------------------------ PPI not appropriate; denied

## 2016-12-11 NOTE — Telephone Encounter (Signed)
Called and notified patient that her refill request was approved and labs due asap. Patient stated she has an appointment on 12/18/2016 @ 8:40. I mention to her we can get labs done then. She agreed

## 2016-12-11 NOTE — Telephone Encounter (Signed)
Glitch in the system; I was not getting electronic refill requests from Dec 13th until yesterday; addressed with IT; handling refill requests now --------------------- I'll send Rx as requested, but please ask patient to have fasting labs done soon; thank you

## 2016-12-13 ENCOUNTER — Encounter: Payer: Self-pay | Admitting: Family Medicine

## 2016-12-13 DIAGNOSIS — F250 Schizoaffective disorder, bipolar type: Secondary | ICD-10-CM | POA: Diagnosis not present

## 2016-12-18 ENCOUNTER — Ambulatory Visit: Payer: Medicare Other | Admitting: Family Medicine

## 2016-12-31 DIAGNOSIS — E119 Type 2 diabetes mellitus without complications: Secondary | ICD-10-CM | POA: Diagnosis not present

## 2017-01-01 ENCOUNTER — Ambulatory Visit (INDEPENDENT_AMBULATORY_CARE_PROVIDER_SITE_OTHER): Payer: Medicare Other | Admitting: Family Medicine

## 2017-01-01 ENCOUNTER — Encounter: Payer: Self-pay | Admitting: Family Medicine

## 2017-01-01 DIAGNOSIS — N952 Postmenopausal atrophic vaginitis: Secondary | ICD-10-CM

## 2017-01-01 DIAGNOSIS — E785 Hyperlipidemia, unspecified: Secondary | ICD-10-CM | POA: Diagnosis not present

## 2017-01-01 DIAGNOSIS — Z5181 Encounter for therapeutic drug level monitoring: Secondary | ICD-10-CM

## 2017-01-01 DIAGNOSIS — I10 Essential (primary) hypertension: Secondary | ICD-10-CM

## 2017-01-01 DIAGNOSIS — E119 Type 2 diabetes mellitus without complications: Secondary | ICD-10-CM | POA: Diagnosis not present

## 2017-01-01 DIAGNOSIS — E669 Obesity, unspecified: Secondary | ICD-10-CM

## 2017-01-01 LAB — COMPLETE METABOLIC PANEL WITH GFR
ALT: 26 U/L (ref 6–29)
AST: 20 U/L (ref 10–35)
Albumin: 3.9 g/dL (ref 3.6–5.1)
Alkaline Phosphatase: 102 U/L (ref 33–115)
BUN: 10 mg/dL (ref 7–25)
CHLORIDE: 106 mmol/L (ref 98–110)
CO2: 26 mmol/L (ref 20–31)
Calcium: 9.7 mg/dL (ref 8.6–10.2)
Creat: 0.8 mg/dL (ref 0.50–1.10)
GFR, EST NON AFRICAN AMERICAN: 87 mL/min (ref >=60)
Glucose, Bld: 149 mg/dL — ABNORMAL HIGH (ref 65–99)
POTASSIUM: 4.4 mmol/L (ref 3.5–5.3)
Sodium: 137 mmol/L (ref 135–146)
Total Bilirubin: 0.5 mg/dL (ref 0.2–1.2)
Total Protein: 6.9 g/dL (ref 6.1–8.1)

## 2017-01-01 LAB — LIPID PANEL
CHOL/HDL RATIO: 3.1 ratio (ref <5.0)
Cholesterol: 118 mg/dL (ref <200)
HDL: 38 mg/dL — ABNORMAL LOW (ref >50)
LDL CALC: 51 mg/dL (ref <100)
Triglycerides: 145 mg/dL (ref <150)
VLDL: 29 mg/dL (ref <30)

## 2017-01-01 MED ORDER — ESTROGENS, CONJUGATED 0.625 MG/GM VA CREA: TOPICAL_CREAM | VAGINAL | 0 refills | Status: DC

## 2017-01-01 MED ORDER — GLUCOSE BLOOD VI STRP: ORAL_STRIP | 3 refills | Status: DC

## 2017-01-01 NOTE — Assessment & Plan Note (Signed)
Check lipids, continue statin, limit saturated fats 

## 2017-01-01 NOTE — Progress Notes (Signed)
BP 122/82   Pulse 87   Temp 98 F (36.7 C) (Oral)   Resp 14   Wt 229 lb (103.9 kg)   LMP 07/09/2014   SpO2 94%   BMI 35.87 kg/m    Subjective:    Patient ID: Elizabeth [name], female    DOB: 1966-09-05, 51 y.o.   MRN: [id number]  HPI: Elizabeth [name] is a 51 y.o. female  Chief Complaint  Patient presents with  . Follow-up   Patient is here for f/u Weight is down, BP is down; she is very pleased with her vital signs today Hurting all over from RA; on MTX MTX may be running sugars up She has type 2 diabetes and checks her sugars every morning; would like to be able to check in the afternoons if morning reading up, wants to keep a close eye on this; her mother has been helping with food suggestions Mother has diabetes, on shots and pills Eye exam UTD Nibbling all night Working on weight loss Vaginal dryness; really causing issues at home; drive is there, but such dryness makes intercourse uncomfortable; using KY jelly Mammogram UTD Mood is doing well on current meds Taking statin for high cholesterol; trying to eat well and lose weight  Depression screen [facility] 2/9 01/01/2017 09/09/2016 06/10/2016 03/11/2016 11/29/2015  Decreased Interest 0 0 0 0 0  Down, Depressed, Hopeless 0 0 3 0 0  PHQ - 2 Score 0 0 3 0 0  Altered sleeping - - 3 - -  Tired, decreased energy - - 1 - -  Change in appetite - - 0 - -  Feeling bad or failure about yourself  - - 0 - -  Trouble concentrating - - 2 - -  Moving slowly or fidgety/restless - - 0 - -  Suicidal thoughts - - 0 - -  PHQ-9 Score - - 9 - -   Relevant past medical, surgical, family and social history reviewed Past Medical History:  Diagnosis Date  . Bipolar 1 disorder ([place])   . Depression   . Diabetes mellitus without complication ([place])   . Diverticulosis   . GERD (gastroesophageal reflux disease)   . Heart murmur    followed by PCP  . Hepatitis    B - "not active"  . Hypertension goal BP (blood pressure) < 130/80 01/17/2016  .  Insomnia   . Irritable bowel syndrome   . Motion sickness    boats  . Obesity (BMI 30-39.9) 07/03/2015  . RA (rheumatoid arthritis) ([place])    Past Surgical History:  Procedure Laterality Date  . BOWEL RESECTION    . CHOLECYSTECTOMY  1997   [place], [place]  . COLONOSCOPY WITH PROPOFOL N/A 09/07/2015   Procedure: COLONOSCOPY WITH PROPOFOL;  Surgeon: Lucilla Lame, MD;  Location: [place];  Service: Endoscopy;  Laterality: N/A;  . ESOPHAGOGASTRODUODENOSCOPY (EGD) WITH ESOPHAGEAL DILATION    . ESOPHAGOGASTRODUODENOSCOPY (EGD) WITH PROPOFOL N/A 09/07/2015   Procedure: ESOPHAGOGASTRODUODENOSCOPY (EGD) WITH PROPOFOL;  Surgeon: Lucilla Lame, MD;  Location: [place];  Service: Endoscopy;  Laterality: N/A;  . PARTIAL COLECTOMY N/A 01/24/2016   Procedure: SIGMOID COLECTOMY;  Surgeon: Florene Glen, MD;  Location: [facility];  Service: General;  Laterality: N/A;  . POLYPECTOMY N/A 09/07/2015   Procedure: POLYPECTOMY;  Surgeon: Lucilla Lame, MD;  Location: [place];  Service: Endoscopy;  Laterality: N/A;  . TUBAL LIGATION     Family History  Problem Relation Age of Onset  . Hypertension Mother   .  Diabetes Mother   . Arthritis/Rheumatoid Mother   . Alcohol abuse Father   . Esophageal varices Father   . Diverticulosis Maternal Aunt   . Diverticulosis Maternal Grandmother    Social History  Substance Use Topics  . Smoking status: Former Smoker    Packs/day: 0.50    Types: Cigarettes    Quit date: 05/06/2015  . Smokeless tobacco: Never Used  . Alcohol use No   Interim medical history since last visit reviewed. Allergies and medications reviewed  Review of Systems Per HPI unless specifically indicated above     Objective:    BP 122/82   Pulse 87   Temp 98 F (36.7 C) (Oral)   Resp 14   Wt 229 lb (103.9 kg)   LMP 07/09/2014   SpO2 94%   BMI 35.87 kg/m   Wt Readings from Last 3 Encounters:  01/01/17 229 lb (103.9 kg)  09/13/16 238 lb (108 kg)  09/09/16  238 lb (108 kg)    Physical Exam  Constitutional: She appears well-developed and well-nourished. No distress.  Obese; weight down 9 pounds over last 3 months  HENT:  Head: Normocephalic and atraumatic.  Eyes: EOM are normal. No scleral icterus.  Neck: No thyromegaly present.  Cardiovascular: Normal rate, regular rhythm and normal heart sounds.   No murmur heard. Pulmonary/Chest: Effort normal and breath sounds normal. No respiratory distress. She has no wheezes.  Abdominal: Soft. Bowel sounds are normal. She exhibits no distension.  Musculoskeletal: Normal range of motion. She exhibits no edema.  Neurological: She is alert. She exhibits normal muscle tone.  Skin: Skin is warm and dry. She is not diaphoretic. No pallor.  Psychiatric: She has a normal mood and affect. Her behavior is normal. Judgment and thought content normal. Her mood appears not anxious. She does not exhibit a depressed mood.   Diabetic Foot Form - Detailed   Diabetic Foot Exam - detailed Diabetic Foot exam was performed with the following findings:  Yes 01/01/2017 11:17 AM  Visual Foot Exam completed.:  Yes  Are the toenails ingrown?:  No Normal Range of Motion:  Yes Pulse Foot Exam completed.:  Yes  Right Dorsalis Pedis:  Present Left Dorsalis Pedis:  Present  Sensory Foot Exam Completed.:  Yes Swelling:  No Semmes-Weinstein Monofilament Test R Site 1-Great Toe:  Pos L Site 1-Great Toe:  Pos  R Site 4:  Pos L Site 4:  Pos  R Site 5:  Pos L Site 5:  Pos          Assessment & Plan:   Problem List Items Addressed This Visit      Cardiovascular and Mediastinum   Hypertension goal BP (blood pressure) < 130/80 (Chronic)    Controlled today; praise given for weight loss        Endocrine   Diabetes mellitus type 2, controlled, without complications (HCC) (Chronic)    Foot exam by MD; on MTX which may affect sugars; check A1c; eye exam UTD; limit whites      Relevant Orders   Hemoglobin A1c (Completed)      Genitourinary   Vaginal atrophy    Mammogram UTD; discussed effects of decrease estrogen on vaginal mucosa; will start topical vaginal estrogen cream, nightly x 2 weeks, then just 2x a week; hopefully, she will note significant improvement within a month        Other   Obesity (BMI 30-39.9) (Chronic)    Praise given, keep up the good work  Medication monitoring encounter    Check labs      Relevant Orders   COMPLETE METABOLIC PANEL WITH GFR (Completed)   Hyperlipidemia LDL goal <100    Check lipids, continue statin, limit saturated fats      Relevant Orders   Lipid panel (Completed)      Follow up plan: Return in about 3 months (around 04/01/2017) for visit and fasting labs.  An after-visit summary was printed and given to the patient at [place].  Please see the patient instructions which may contain other information and recommendations beyond what is mentioned above in the assessment and plan.  Meds ordered this encounter  Medications  . conjugated estrogens (PREMARIN) vaginal cream    Sig: One applicator full every night for two weeks, then just twice a week    Dispense:  42.5 g    Refill:  0  . glucose blood test strip    Sig: Use to check blood sugars twice a day; fluctuating sugars; Dx E11.65; LON 99 months    Dispense:  200 each    Refill:  3    Orders Placed This Encounter  Procedures  . Lipid panel  . Hemoglobin A1c  . COMPLETE METABOLIC PANEL WITH GFR

## 2017-01-01 NOTE — Assessment & Plan Note (Signed)
Mammogram UTD; discussed effects of decrease estrogen on vaginal mucosa; will start topical vaginal estrogen cream, nightly x 2 weeks, then just 2x a week; hopefully, she will note significant improvement within a month

## 2017-01-01 NOTE — Assessment & Plan Note (Signed)
Foot exam by MD; on MTX which may affect sugars; check A1c; eye exam UTD; limit whites

## 2017-01-01 NOTE — Patient Instructions (Signed)
Keep up the great job with your weight loss efforts Try to limit saturated fats in your diet (bologna, hot dogs, barbeque, cheeseburgers, hamburgers, steak, bacon, sausage, cheese, etc.) and get more fresh fruits, vegetables, and whole grains Please do see your eye doctor regularly, and have your eyes examined every year (or more often per his or her recommendation) Check your feet every night and let me know right away of any sores, infections, numbness, etc. Try to limit sweets, white bread, white rice, white potatoes It is okay with me for you to check your fingerstick blood sugars twice a day Start the new vaginal cream, every night for two weeks and then just one night twice a week

## 2017-01-01 NOTE — Assessment & Plan Note (Signed)
Controlled today; praise given for weight loss

## 2017-01-01 NOTE — Assessment & Plan Note (Signed)
Praise given, keep up the good work

## 2017-01-01 NOTE — Assessment & Plan Note (Signed)
Check labs 

## 2017-01-02 LAB — HEMOGLOBIN A1C
HEMOGLOBIN A1C: 7.1 % — AB (ref <5.7)
MEAN PLASMA GLUCOSE: 157 mg/dL

## 2017-01-02 LAB — HM DIABETES EYE EXAM

## 2017-01-03 ENCOUNTER — Other Ambulatory Visit: Payer: Self-pay | Admitting: Family Medicine

## 2017-01-06 NOTE — Telephone Encounter (Signed)
ALT 26, lipids reviewed; Rx approved

## 2017-01-10 DIAGNOSIS — F250 Schizoaffective disorder, bipolar type: Secondary | ICD-10-CM | POA: Diagnosis not present

## 2017-01-16 ENCOUNTER — Other Ambulatory Visit: Payer: Self-pay | Admitting: Family Medicine

## 2017-01-16 MED ORDER — GLUCOSE BLOOD VI STRP: ORAL_STRIP | 3 refills | Status: DC

## 2017-01-16 NOTE — Progress Notes (Signed)
Roselyn Reef is faxing this to [employer] personally; hand-written

## 2017-01-23 DIAGNOSIS — M0609 Rheumatoid arthritis without rheumatoid factor, multiple sites: Secondary | ICD-10-CM | POA: Diagnosis not present

## 2017-01-23 DIAGNOSIS — Z79899 Other long term (current) drug therapy: Secondary | ICD-10-CM | POA: Diagnosis not present

## 2017-02-07 DIAGNOSIS — F250 Schizoaffective disorder, bipolar type: Secondary | ICD-10-CM | POA: Diagnosis not present

## 2017-02-10 ENCOUNTER — Telehealth: Payer: Self-pay | Admitting: Family Medicine

## 2017-02-10 ENCOUNTER — Other Ambulatory Visit: Payer: Self-pay

## 2017-02-10 MED ORDER — EZETIMIBE 10 MG PO TABS: 10.0000 mg | ORAL_TABLET | Freq: Every day | ORAL | 3 refills | Status: DC

## 2017-02-10 NOTE — Telephone Encounter (Signed)
Patient notified called into [facility] which is now [facility] but has not been changed in system yet

## 2017-02-10 NOTE — Telephone Encounter (Signed)
Please thank her for letting us know Please add lipitor to adverse reactions and take lipitor off med list if she's had to stop taking it Please let her know we'll send Zetia (medicine for cholesterol that does not work like a statin at all); I could not send directly, because there was no pharmacy listed (some are just dropping off -- ?); please update her pharmacy Thank you

## 2017-02-10 NOTE — Telephone Encounter (Signed)
Pt had reaction to lipitor. She had muscle ache, skin crawly and itchy, heartburn. She did stop taking the medication.

## 2017-02-27 ENCOUNTER — Ambulatory Visit: Payer: Medicare Other | Admitting: Family Medicine

## 2017-03-14 DIAGNOSIS — F250 Schizoaffective disorder, bipolar type: Secondary | ICD-10-CM | POA: Diagnosis not present

## 2017-03-27 DIAGNOSIS — M0609 Rheumatoid arthritis without rheumatoid factor, multiple sites: Secondary | ICD-10-CM | POA: Diagnosis not present

## 2017-03-27 DIAGNOSIS — M7061 Trochanteric bursitis, right hip: Secondary | ICD-10-CM | POA: Diagnosis not present

## 2017-03-27 DIAGNOSIS — Z79899 Other long term (current) drug therapy: Secondary | ICD-10-CM | POA: Diagnosis not present

## 2017-03-27 DIAGNOSIS — M7062 Trochanteric bursitis, left hip: Secondary | ICD-10-CM | POA: Diagnosis not present

## 2017-04-01 ENCOUNTER — Ambulatory Visit: Payer: Medicare Other | Admitting: Family Medicine

## 2017-04-01 ENCOUNTER — Ambulatory Visit (INDEPENDENT_AMBULATORY_CARE_PROVIDER_SITE_OTHER): Payer: Medicare Other | Admitting: Family Medicine

## 2017-04-01 ENCOUNTER — Encounter: Payer: Self-pay | Admitting: Family Medicine

## 2017-04-01 DIAGNOSIS — E785 Hyperlipidemia, unspecified: Secondary | ICD-10-CM

## 2017-04-01 DIAGNOSIS — Z5181 Encounter for therapeutic drug level monitoring: Secondary | ICD-10-CM

## 2017-04-01 DIAGNOSIS — E669 Obesity, unspecified: Secondary | ICD-10-CM | POA: Diagnosis not present

## 2017-04-01 DIAGNOSIS — M05742 Rheumatoid arthritis with rheumatoid factor of left hand without organ or systems involvement: Secondary | ICD-10-CM | POA: Diagnosis not present

## 2017-04-01 DIAGNOSIS — E119 Type 2 diabetes mellitus without complications: Secondary | ICD-10-CM

## 2017-04-01 DIAGNOSIS — M05741 Rheumatoid arthritis with rheumatoid factor of right hand without organ or systems involvement: Secondary | ICD-10-CM

## 2017-04-01 DIAGNOSIS — I10 Essential (primary) hypertension: Secondary | ICD-10-CM

## 2017-04-01 LAB — CBC WITH DIFFERENTIAL/PLATELET
BASOS ABS: 0 {cells}/uL (ref 0–200)
Basophils Relative: 0 %
EOS PCT: 3 %
Eosinophils Absolute: 114 cells/uL (ref 15–500)
HCT: 40 % (ref 35.0–45.0)
HEMOGLOBIN: 13.4 g/dL (ref 11.7–15.5)
LYMPHS PCT: 46 %
Lymphs Abs: 1748 cells/uL (ref 850–3900)
MCH: 30.7 pg (ref 27.0–33.0)
MCHC: 33.5 g/dL (ref 32.0–36.0)
MCV: 91.5 fL (ref 80.0–100.0)
MPV: 9.6 fL (ref 7.5–12.5)
Monocytes Absolute: 228 cells/uL (ref 200–950)
Monocytes Relative: 6 %
NEUTROS PCT: 45 %
Neutro Abs: 1710 cells/uL (ref 1500–7800)
Platelets: 247 10*3/uL (ref 140–400)
RBC: 4.37 MIL/uL (ref 3.80–5.10)
RDW: 14.5 % (ref 11.0–15.0)
WBC: 3.8 10*3/uL (ref 3.8–10.8)

## 2017-04-01 LAB — COMPLETE METABOLIC PANEL WITH GFR
ALBUMIN: 4.1 g/dL (ref 3.6–5.1)
ALK PHOS: 102 U/L (ref 33–115)
ALT: 31 U/L — ABNORMAL HIGH (ref 6–29)
AST: 21 U/L (ref 10–35)
BUN: 11 mg/dL (ref 7–25)
CALCIUM: 10 mg/dL (ref 8.6–10.2)
CHLORIDE: 106 mmol/L (ref 98–110)
CO2: 21 mmol/L (ref 20–31)
Creat: 0.86 mg/dL (ref 0.50–1.10)
GFR, EST NON AFRICAN AMERICAN: 80 mL/min (ref >=60)
GFR, Est African American: >89 mL/min (ref >=60)
GLUCOSE: 121 mg/dL — AB (ref 65–99)
POTASSIUM: 4.6 mmol/L (ref 3.5–5.3)
SODIUM: 139 mmol/L (ref 135–146)
Total Bilirubin: 0.5 mg/dL (ref 0.2–1.2)
Total Protein: 7 g/dL (ref 6.1–8.1)

## 2017-04-01 NOTE — Assessment & Plan Note (Signed)
Check CBC and CMP. 

## 2017-04-01 NOTE — Progress Notes (Signed)
BP 120/84   Pulse 82   Temp 98 F (36.7 C) (Oral)   Resp 16   Wt 223 lb (101.2 kg)   LMP 07/09/2014   SpO2 98%   BMI 34.93 kg/m    Subjective:    Patient ID: Elizabeth [name], female    DOB: 03/27/1966, 51 y.o.   MRN: [id number]  HPI: Elizabeth [name] is a 51 y.o. female  Chief Complaint  Patient presents with  . Follow-up    HPI  Sciatica; going to [place] [facility] and having scan and may get injection; runs all the way down both legs and both hips; may be bursitis; hurts in the evening laying down; able to be active  Type 2 diabetes; last A1c just over 7 Checking her own sugars; 119 and around that area; no blurred vision; no horribly dry mouth Lab Results  Component Value Date   HGBA1C 7.1 (H) 01/01/2017   Dropped LDL from 119 to 51; low HDL but not horribly son, 36 last check Liver function and kidney function Jan were fine  Obesity; using her treadmill; down 6 pounds since last visit; walking 30 minutes a day, cutting grass  MTX managed by Dr. Tacey Ruiz; has RA; pain level depends; terrible on cold damp days  Depression screen [facility] 2/9 04/01/2017 01/01/2017 09/09/2016 06/10/2016 03/11/2016  Decreased Interest 0 0 0 0 0  Down, Depressed, Hopeless 0 0 0 3 0  PHQ - 2 Score 0 0 0 3 0  Altered sleeping - - - 3 -  Tired, decreased energy - - - 1 -  Change in appetite - - - 0 -  Feeling bad or failure about yourself  - - - 0 -  Trouble concentrating - - - 2 -  Moving slowly or fidgety/restless - - - 0 -  Suicidal thoughts - - - 0 -  PHQ-9 Score - - - 9 -    Relevant past medical, surgical, family and social history reviewed Past Medical History:  Diagnosis Date  . Bipolar 1 disorder ([place])   . Depression   . Diabetes mellitus without complication ([place])   . Diverticulosis   . GERD (gastroesophageal reflux disease)   . Heart murmur    followed by PCP  . Hepatitis    B - "not active"  . Hypertension goal BP (blood pressure) < 130/80 01/17/2016  . Insomnia   .  Irritable bowel syndrome   . Motion sickness    boats  . Obesity (BMI 30-39.9) 07/03/2015  . RA (rheumatoid arthritis) ([place])   . Sciatica    Past Surgical History:  Procedure Laterality Date  . BOWEL RESECTION    . CHOLECYSTECTOMY  1997   [place], [place]  . COLONOSCOPY WITH PROPOFOL N/A 09/07/2015   Procedure: COLONOSCOPY WITH PROPOFOL;  Surgeon: Lucilla Lame, MD;  Location: [place];  Service: Endoscopy;  Laterality: N/A;  . ESOPHAGOGASTRODUODENOSCOPY (EGD) WITH ESOPHAGEAL DILATION    . ESOPHAGOGASTRODUODENOSCOPY (EGD) WITH PROPOFOL N/A 09/07/2015   Procedure: ESOPHAGOGASTRODUODENOSCOPY (EGD) WITH PROPOFOL;  Surgeon: Lucilla Lame, MD;  Location: [place];  Service: Endoscopy;  Laterality: N/A;  . PARTIAL COLECTOMY N/A 01/24/2016   Procedure: SIGMOID COLECTOMY;  Surgeon: Florene Glen, MD;  Location: [facility];  Service: General;  Laterality: N/A;  . POLYPECTOMY N/A 09/07/2015   Procedure: POLYPECTOMY;  Surgeon: Lucilla Lame, MD;  Location: [place];  Service: Endoscopy;  Laterality: N/A;  . TUBAL LIGATION     Family History  Problem  Relation Age of Onset  . Hypertension Mother   . Diabetes Mother   . Arthritis/Rheumatoid Mother   . Hyperlipidemia Mother   . Alcohol abuse Father   . Esophageal varices Father   . Diverticulosis Maternal Aunt   . Diverticulosis Maternal Grandmother   . Diabetes Maternal Grandmother   . Heart disease Maternal Grandmother   . Dementia Maternal Grandmother   . Diabetes Brother   . Aneurysm Maternal Grandfather   . Diabetes Paternal Grandmother    Social History  Substance Use Topics  . Smoking status: Former Smoker    Packs/day: 0.50    Types: Cigarettes    Quit date: 05/06/2015  . Smokeless tobacco: Never Used  . Alcohol use No    Interim medical history since last visit reviewed. Allergies and medications reviewed  Review of Systems Per HPI unless specifically indicated above     Objective:    BP 120/84    Pulse 82   Temp 98 F (36.7 C) (Oral)   Resp 16   Wt 223 lb (101.2 kg)   LMP 07/09/2014   SpO2 98%   BMI 34.93 kg/m   Wt Readings from Last 3 Encounters:  04/01/17 223 lb (101.2 kg)  01/01/17 229 lb (103.9 kg)  09/13/16 238 lb (108 kg)    Physical Exam  Constitutional: She appears well-developed and well-nourished. No distress.  Obese; weight down another 6 pounds over last 3 months  HENT:  Head: Normocephalic and atraumatic.  Eyes: EOM are normal. No scleral icterus.  Neck: No thyromegaly present.  Cardiovascular: Normal rate, regular rhythm and normal heart sounds.   No murmur heard. Pulmonary/Chest: Effort normal and breath sounds normal. No respiratory distress. She has no wheezes.  Abdominal: Soft. Bowel sounds are normal. She exhibits no distension.  Musculoskeletal: Normal range of motion. She exhibits no edema.  Neurological: She is alert. She exhibits normal muscle tone.  Skin: Skin is warm and dry. She is not diaphoretic. No pallor.  Psychiatric: She has a normal mood and affect. Her behavior is normal. Judgment and thought content normal. Her mood appears not anxious. She does not exhibit a depressed mood.   Diabetic Foot Form - Detailed   Diabetic Foot Exam - detailed Diabetic Foot exam was performed with the following findings:  Yes 04/01/2017 10:53 AM  Visual Foot Exam completed.:  Yes  Are the toenails ingrown?:  No Normal Range of Motion:  Yes Pulse Foot Exam completed.:  Yes  Right Dorsalis Pedis:  Present Left Dorsalis Pedis:  Present  Sensory Foot Exam Completed.:  Yes Swelling:  No Semmes-Weinstein Monofilament Test R Site 1-Great Toe:  Pos L Site 1-Great Toe:  Pos  R Site 4:  Pos L Site 4:  Pos  R Site 5:  Pos L Site 5:  Pos        Results for orders placed or performed in visit on 01/03/17  HM DIABETES EYE EXAM  Result Value Ref Range   HM Diabetic Eye Exam No Retinopathy No Retinopathy      Assessment & Plan:   Problem List Items  Addressed This Visit      Cardiovascular and Mediastinum   Hypertension goal BP (blood pressure) < 130/80 (Chronic)    Excellent BP        Endocrine   Diabetes mellitus type 2, controlled, without complications (HCC) (Chronic)    Foot exam by MD today; encouragement given for ongoing weight loss; check A1c, goal less than 7  Relevant Orders   Microalbumin / creatinine urine ratio   Hemoglobin A1c     Musculoskeletal and Integument   Rheumatoid arthritis involving both hands (HCC) (Chronic)    Managed by rheum; check CBC and CMP today      Relevant Medications   meloxicam (MOBIC) 7.5 MG tablet   cyclobenzaprine (FLEXERIL) 5 MG tablet     Other   Obesity (BMI 30-39.9) (Chronic)    Down to BMI of 34, keep up the good work      Medication monitoring encounter    Check CBC and CMP      Relevant Orders   CBC with Differential/Platelet   COMPLETE METABOLIC PANEL WITH GFR   Hyperlipidemia LDL goal <100    Check lipids next visit; really brought LDL down nicely last time          Follow up plan: Return in about 3 months (around 07/01/2017) for twenty minute follow-up with fasting labs.  An after-visit summary was printed and given to the patient at [place].  Please see the patient instructions which may contain other information and recommendations beyond what is mentioned above in the assessment and plan.  Meds ordered this encounter  Medications  . meloxicam (MOBIC) 7.5 MG tablet    Sig: Take 7.5 mg by mouth daily.  Marland Kitchen gabapentin (NEURONTIN) 100 MG capsule    Sig: Take 200 mg by mouth at bedtime.  . cyclobenzaprine (FLEXERIL) 5 MG tablet    Sig: Take 5 mg by mouth as needed.  . Multiple Vitamins-Minerals (MULTIVITAMIN/EXTRA VITAMIN D3 PO)    Sig: Take by mouth.    Orders Placed This Encounter  Procedures  . Microalbumin / creatinine urine ratio  . Hemoglobin A1c  . CBC with Differential/Platelet  . COMPLETE METABOLIC PANEL WITH GFR

## 2017-04-01 NOTE — Patient Instructions (Signed)
Keep up the great effort at weight loss Please do see your eye doctor regularly, and have your eyes examined every year (or more often per his or her recommendation) Check your feet every night and let me know right away of any sores, infections, numbness, etc. Try to limit sweets, white bread, white rice, white potatoes It is okay with me for you to not check your fingerstick blood sugars (per [employer] of Endocrinology Best Practices), unless you are interested and feel it would be helpful for you Let's get labs today

## 2017-04-01 NOTE — Assessment & Plan Note (Signed)
Foot exam by MD today; encouragement given for ongoing weight loss; check A1c, goal less than 7

## 2017-04-01 NOTE — Assessment & Plan Note (Signed)
Check lipids next visit; really brought LDL down nicely last time

## 2017-04-01 NOTE — Assessment & Plan Note (Signed)
Down to BMI of 34, keep up the good work

## 2017-04-01 NOTE — Assessment & Plan Note (Signed)
Managed by rheum; check CBC and CMP today

## 2017-04-01 NOTE — Assessment & Plan Note (Signed)
Excellent BP

## 2017-04-02 LAB — MICROALBUMIN / CREATININE URINE RATIO
CREATININE, URINE: 98 mg/dL (ref 20–320)
MICROALB UR: 0.2 mg/dL
MICROALB/CREAT RATIO: 2 ug/mg{creat} (ref <30)

## 2017-04-02 LAB — HEMOGLOBIN A1C
Hgb A1c MFr Bld: 6.6 % — ABNORMAL HIGH (ref <5.7)
MEAN PLASMA GLUCOSE: 143 mg/dL

## 2017-04-03 ENCOUNTER — Ambulatory Visit: Payer: Medicare Other | Admitting: Family Medicine

## 2017-04-03 DIAGNOSIS — M7061 Trochanteric bursitis, right hip: Secondary | ICD-10-CM | POA: Diagnosis not present

## 2017-04-03 DIAGNOSIS — M7062 Trochanteric bursitis, left hip: Secondary | ICD-10-CM | POA: Diagnosis not present

## 2017-04-10 ENCOUNTER — Other Ambulatory Visit: Payer: Self-pay | Admitting: Family Medicine

## 2017-04-11 DIAGNOSIS — F250 Schizoaffective disorder, bipolar type: Secondary | ICD-10-CM | POA: Diagnosis not present

## 2017-04-23 DIAGNOSIS — Z79899 Other long term (current) drug therapy: Secondary | ICD-10-CM | POA: Diagnosis not present

## 2017-07-02 ENCOUNTER — Ambulatory Visit (INDEPENDENT_AMBULATORY_CARE_PROVIDER_SITE_OTHER): Payer: Medicare Other | Admitting: Family Medicine

## 2017-07-02 ENCOUNTER — Encounter: Payer: Self-pay | Admitting: Family Medicine

## 2017-07-02 VITALS — BP 118/76 | HR 95 | Temp 97.6°F | Resp 14 | Wt 212.8 lb

## 2017-07-02 DIAGNOSIS — I10 Essential (primary) hypertension: Secondary | ICD-10-CM | POA: Diagnosis not present

## 2017-07-02 DIAGNOSIS — F3132 Bipolar disorder, current episode depressed, moderate: Secondary | ICD-10-CM | POA: Diagnosis not present

## 2017-07-02 DIAGNOSIS — K589 Irritable bowel syndrome without diarrhea: Secondary | ICD-10-CM

## 2017-07-02 DIAGNOSIS — E785 Hyperlipidemia, unspecified: Secondary | ICD-10-CM

## 2017-07-02 DIAGNOSIS — E119 Type 2 diabetes mellitus without complications: Secondary | ICD-10-CM | POA: Diagnosis not present

## 2017-07-02 DIAGNOSIS — E559 Vitamin D deficiency, unspecified: Secondary | ICD-10-CM

## 2017-07-02 DIAGNOSIS — M05742 Rheumatoid arthritis with rheumatoid factor of left hand without organ or systems involvement: Secondary | ICD-10-CM

## 2017-07-02 DIAGNOSIS — R634 Abnormal weight loss: Secondary | ICD-10-CM | POA: Diagnosis not present

## 2017-07-02 DIAGNOSIS — M05741 Rheumatoid arthritis with rheumatoid factor of right hand without organ or systems involvement: Secondary | ICD-10-CM | POA: Diagnosis not present

## 2017-07-02 DIAGNOSIS — Z5181 Encounter for therapeutic drug level monitoring: Secondary | ICD-10-CM | POA: Diagnosis not present

## 2017-07-02 DIAGNOSIS — E669 Obesity, unspecified: Secondary | ICD-10-CM | POA: Diagnosis not present

## 2017-07-02 LAB — TSH: TSH: 0.63 mIU/L

## 2017-07-02 NOTE — Assessment & Plan Note (Signed)
Treated by rheumatologist

## 2017-07-02 NOTE — Assessment & Plan Note (Signed)
Managed by [place]; stable

## 2017-07-02 NOTE — Assessment & Plan Note (Signed)
Retest of liver enzyme in May was normal at [facility]

## 2017-07-02 NOTE — Assessment & Plan Note (Signed)
Treated with linzess, encouraged stress reduction

## 2017-07-02 NOTE — Patient Instructions (Signed)
Continue to keep a close eye on your feet every day Let us know about any sores right away Check out the information at familydoctor.org entitled "Nutrition for Weight Loss: What You Need to Know about Fad Diets" Try to lose between 1-2 pounds per week by taking in fewer calories and burning off more calories You can succeed by limiting portions, limiting foods dense in calories and fat, becoming more active, and drinking 8 glasses of water a day (64 ounces) Don't skip meals, especially breakfast, as skipping meals may alter your metabolism Do not use over-the-counter weight loss pills or gimmicks that claim rapid weight loss A healthy BMI (or body mass index) is between 18.5 and 24.9 You can calculate your ideal BMI at the [place] website [employer]fr

## 2017-07-02 NOTE — Progress Notes (Signed)
BP 118/76   Pulse 95   Temp 97.6 F (36.4 C) (Oral)   Resp 14   Wt 212 lb 12.8 oz (96.5 kg)   LMP 07/09/2014   SpO2 98%   BMI 33.33 kg/m    Subjective:    Patient ID: Elizabeth [name], female    DOB: January 26, 1966, 51 y.o.   MRN: [id number]  HPI: Elizabeth [name] is a 51 y.o. female  Chief Complaint  Patient presents with  . Follow-up   HPI Type 2 diabetes; checking her sugars; once a day; last two weeks, 119-129 No dry mouth, no blurred vision, no new foot problems Lab Results  Component Value Date   HGBA1C 6.6 (H) 04/01/2017   High blood pressure; excellent control today; avoiding excess salt; avoids decongestants  High cholesterol; she is on zetia Lab Results  Component Value Date   CHOL 118 01/01/2017   HDL 38 (L) 01/01/2017   LDLCALC 51 01/01/2017   TRIG 145 01/01/2017   CHOLHDL 3.1 01/01/2017   She has methotrexate and meloxicam on her med list, but says she isn't taking meloxicam any more; that was just for bursitis and that is better; she has rheumatoid arthritis managed by rheumatologist  She is on linzess for IBS  Obesity, dropping weight on purpose; outside working in the yard; on purpose  Bipolar d/o; managed by [facility]; stable, on medicines; daughter also has bipolar d/o  Discussed shingles vaccine; she'll read about it and can return for vaccine if desired  Vit D deficiency; reviewed [facility] labs, no result in the last year  Depression screen [facility] 2/9 07/02/2017 04/01/2017 01/01/2017 09/09/2016 06/10/2016  Decreased Interest 0 0 0 0 0  Down, Depressed, Hopeless 0 0 0 0 3  PHQ - 2 Score 0 0 0 0 3  Altered sleeping - - - - 3  Tired, decreased energy - - - - 1  Change in appetite - - - - 0  Feeling bad or failure about yourself  - - - - 0  Trouble concentrating - - - - 2  Moving slowly or fidgety/restless - - - - 0  Suicidal thoughts - - - - 0  PHQ-9 Score - - - - 9   Relevant past medical, surgical, family and social history reviewed Past Medical History:    Diagnosis Date  . Bipolar 1 disorder ([place])   . Depression   . Diabetes mellitus without complication ([place])   . Diverticulosis   . GERD (gastroesophageal reflux disease)   . Heart murmur    followed by PCP  . Hepatitis    B - "not active"  . Hypertension goal BP (blood pressure) < 130/80 01/17/2016  . Insomnia   . Irritable bowel syndrome   . Motion sickness    boats  . Obesity (BMI 30-39.9) 07/03/2015  . RA (rheumatoid arthritis) ([place])   . Sciatica    Past Surgical History:  Procedure Laterality Date  . BOWEL RESECTION    . CHOLECYSTECTOMY  1997   [place], [place]  . COLONOSCOPY WITH PROPOFOL N/A 09/07/2015   Procedure: COLONOSCOPY WITH PROPOFOL;  Surgeon: Lucilla Lame, MD;  Location: [place];  Service: Endoscopy;  Laterality: N/A;  . ESOPHAGOGASTRODUODENOSCOPY (EGD) WITH ESOPHAGEAL DILATION    . ESOPHAGOGASTRODUODENOSCOPY (EGD) WITH PROPOFOL N/A 09/07/2015   Procedure: ESOPHAGOGASTRODUODENOSCOPY (EGD) WITH PROPOFOL;  Surgeon: Lucilla Lame, MD;  Location: [place];  Service: Endoscopy;  Laterality: N/A;  . PARTIAL COLECTOMY N/A 01/24/2016   Procedure: SIGMOID COLECTOMY;  Surgeon: Richard E Cooper, MD;  Location: [facility];  Service: General;  Laterality: N/A;  . POLYPECTOMY N/A 09/07/2015   Procedure: POLYPECTOMY;  Surgeon: Darren Wohl, MD;  Location: [facility];  Service: Endoscopy;  Laterality: N/A;  . TUBAL LIGATION     Family History  Problem Relation Age of Onset  . Hypertension Mother   . Diabetes Mother   . Arthritis/Rheumatoid Mother   . Hyperlipidemia Mother   . Alcohol abuse Father   . Esophageal varices Father   . Diverticulosis Maternal Aunt   . Diverticulosis Maternal Grandmother   . Diabetes Maternal Grandmother   . Heart disease Maternal Grandmother   . Dementia Maternal Grandmother   . Diabetes Brother   . Aneurysm Maternal Grandfather   . Diabetes Paternal Grandmother   . Depression Daughter        bipolar disorder   Social  History   Social History  . Marital status: Married    Spouse name: N/A  . Number of children: N/A  . Years of education: N/A   Occupational History  . Not on file.   Social History Main Topics  . Smoking status: Former Smoker    Packs/day: 0.50    Types: Cigarettes    Quit date: 05/06/2015  . Smokeless tobacco: Never Used  . Alcohol use No  . Drug use: No  . Sexual activity: Yes    Birth control/ protection: None   Other Topics Concern  . Not on file   Social History Narrative  . No narrative on file    Interim medical history since last visit reviewed. Allergies and medications reviewed  Review of Systems  Constitutional: Negative for unexpected weight change.  Gastrointestinal: Negative for blood in stool.  Genitourinary: Negative for hematuria.  Per HPI unless specifically indicated above     Objective:    BP 118/76   Pulse 95   Temp 97.6 F (36.4 C) (Oral)   Resp 14   Wt 212 lb 12.8 oz (96.5 kg)   LMP 07/09/2014   SpO2 98%   BMI 33.33 kg/m   Wt Readings from Last 3 Encounters:  07/02/17 212 lb 12.8 oz (96.5 kg)  04/01/17 223 lb (101.2 kg)  01/01/17 229 lb (103.9 kg)    Physical Exam  Constitutional: She appears well-developed and well-nourished. No distress.  Obese; weight down 10+ pounds over last 3 months  HENT:  Head: Normocephalic and atraumatic.  Eyes: EOM are normal. No scleral icterus.  Neck: No thyromegaly present.  Cardiovascular: Normal rate, regular rhythm and normal heart sounds.   No murmur heard. Pulmonary/Chest: Effort normal and breath sounds normal. No respiratory distress. She has no wheezes.  Abdominal: Soft. Bowel sounds are normal. She exhibits no distension.  Musculoskeletal: Normal range of motion. She exhibits no edema.  Neurological: She is alert. She exhibits normal muscle tone.  Skin: Skin is warm and dry. She is not diaphoretic. No pallor.  Psychiatric: She has a normal mood and affect. Her behavior is normal.  Judgment and thought content normal. Her mood appears not anxious. She does not exhibit a depressed mood.   Diabetic Foot Form - Detailed   Diabetic Foot Exam - detailed Diabetic Foot exam was performed with the following findings:  Yes 07/02/2017  9:19 AM  Visual Foot Exam completed.:  Yes  Normal Range of Motion:  Yes Pulse Foot Exam completed.:  Yes  Right Dorsalis Pedis:  Present Left Dorsalis Pedis:  Present  Sensory Foot Exam Completed.:    Yes Semmes-Weinstein Monofilament Test R Site 1-Great Toe:  Pos L Site 1-Great Toe:  Pos       Results for orders placed or performed in visit on 04/01/17  Microalbumin / creatinine urine ratio  Result Value Ref Range   Creatinine, Urine 98 20 - 320 mg/dL   Microalb, Ur 0.2 Not estab mg/dL   Microalb Creat Ratio 2 <30 mcg/mg creat  Hemoglobin A1c  Result Value Ref Range   Hgb A1c MFr Bld 6.6 (H) <5.7 %   Mean Plasma Glucose 143 mg/dL  CBC with Differential/Platelet  Result Value Ref Range   WBC 3.8 3.8 - 10.8 K/uL   RBC 4.37 3.80 - 5.10 MIL/uL   Hemoglobin 13.4 11.7 - 15.5 g/dL   HCT 40.0 35.0 - 45.0 %   MCV 91.5 80.0 - 100.0 fL   MCH 30.7 27.0 - 33.0 pg   MCHC 33.5 32.0 - 36.0 g/dL   RDW 14.5 11.0 - 15.0 %   Platelets 247 140 - 400 K/uL   MPV 9.6 7.5 - 12.5 fL   Neutro Abs 1,710 1,500 - 7,800 cells/uL   Lymphs Abs 1,748 850 - 3,900 cells/uL   Monocytes Absolute 228 200 - 950 cells/uL   Eosinophils Absolute 114 15 - 500 cells/uL   Basophils Absolute 0 0 - 200 cells/uL   Neutrophils Relative % 45 %   Lymphocytes Relative 46 %   Monocytes Relative 6 %   Eosinophils Relative 3 %   Basophils Relative 0 %   Smear Review Criteria for review not met   COMPLETE METABOLIC PANEL WITH GFR  Result Value Ref Range   Sodium 139 135 - 146 mmol/L   Potassium 4.6 3.5 - 5.3 mmol/L   Chloride 106 98 - 110 mmol/L   CO2 21 20 - 31 mmol/L   Glucose, Bld 121 (H) 65 - 99 mg/dL   BUN 11 7 - 25 mg/dL   Creat 0.86 0.50 - 1.10 mg/dL   Total  Bilirubin 0.5 0.2 - 1.2 mg/dL   Alkaline Phosphatase 102 33 - 115 U/L   AST 21 10 - 35 U/L   ALT 31 (H) 6 - 29 U/L   Total Protein 7.0 6.1 - 8.1 g/dL   Albumin 4.1 3.6 - 5.1 g/dL   Calcium 10.0 8.6 - 10.2 mg/dL   GFR, Est African American >89 >=60 mL/min   GFR, Est Non African American 80 >=60 mL/min      Assessment & Plan:   Problem List Items Addressed This Visit      Cardiovascular and Mediastinum   Hypertension goal BP (blood pressure) < 130/80 (Chronic)    Well-controlled today        Digestive   IBS (irritable bowel syndrome)    Treated with linzess, encouraged stress reduction        Endocrine   Diabetes mellitus type 2, controlled, without complications (HCC) - Primary (Chronic)    Foot exam by MD today; continue to check sugars if helpful; limit sweets      Relevant Orders   Hemoglobin A1c     Musculoskeletal and Integument   Rheumatoid arthritis involving both hands (HCC) (Chronic)    Treated by rheumatologist        Other   Vitamin D deficiency    Check level and replace if needed      Relevant Orders   VITAMIN D 25 Hydroxy (Vit-D Deficiency, Fractures)   Obesity (BMI 30-39.9) (Chronic)    Praise given for   amazing job at losing weight      Medication monitoring encounter    Retest of liver enzyme in May was normal at [facility]      Hyperlipidemia LDL goal <100    Check labs next time, try to lose more weight, continue zetia; limit saturated fats      Bipolar disorder, current episode depressed, moderate (HCC) (Chronic)    Managed by [facility]; stable       Other Visit Diagnoses    Weight loss       patient believes all purposeful; will check tsh; denies blood in urine or stool   Relevant Orders   TSH       Follow up plan: Return in about 6 months (around 01/02/2018) for twenty minute follow-up with fasting labs.  An after-visit summary was printed and given to the patient at [facility].  Please see the patient instructions which may contain  other information and recommendations beyond what is mentioned above in the assessment and plan.  No orders of the defined types were placed in this encounter.   Orders Placed This Encounter  Procedures  . VITAMIN D 25 Hydroxy (Vit-D Deficiency, Fractures)  . TSH  . Hemoglobin A1c

## 2017-07-02 NOTE — Assessment & Plan Note (Signed)
Foot exam by MD today; continue to check sugars if helpful; limit sweets

## 2017-07-02 NOTE — Assessment & Plan Note (Signed)
Well controlled today.

## 2017-07-02 NOTE — Assessment & Plan Note (Signed)
Check level and replace if needed 

## 2017-07-02 NOTE — Assessment & Plan Note (Addendum)
Check labs next time, try to lose more weight, continue zetia; limit saturated fats

## 2017-07-02 NOTE — Assessment & Plan Note (Signed)
Praise given for amazing job at losing weight

## 2017-07-03 LAB — HEMOGLOBIN A1C
Hgb A1c MFr Bld: 6.5 % — ABNORMAL HIGH (ref <5.7)
Mean Plasma Glucose: 140 mg/dL

## 2017-07-03 LAB — VITAMIN D 25 HYDROXY (VIT D DEFICIENCY, FRACTURES): Vit D, 25-Hydroxy: 34 ng/mL (ref 30–100)

## 2017-07-06 ENCOUNTER — Other Ambulatory Visit: Payer: Self-pay | Admitting: Family Medicine

## 2017-07-11 ENCOUNTER — Encounter: Payer: Self-pay | Admitting: Family Medicine

## 2017-07-11 ENCOUNTER — Ambulatory Visit: Payer: Medicare Other | Admitting: Family Medicine

## 2017-07-11 ENCOUNTER — Ambulatory Visit (INDEPENDENT_AMBULATORY_CARE_PROVIDER_SITE_OTHER): Payer: Medicare Other | Admitting: Family Medicine

## 2017-07-11 VITALS — BP 118/76 | HR 91 | Temp 98.3°F | Resp 18 | Ht 67.0 in | Wt 212.9 lb

## 2017-07-11 DIAGNOSIS — F250 Schizoaffective disorder, bipolar type: Secondary | ICD-10-CM | POA: Diagnosis not present

## 2017-07-11 DIAGNOSIS — H0016 Chalazion left eye, unspecified eyelid: Secondary | ICD-10-CM | POA: Diagnosis not present

## 2017-07-11 NOTE — Progress Notes (Addendum)
Name: Elizabeth [name]   MRN: [id number]    DOB: 1966/12/03   Date:07/11/2017       Progress Note  Subjective  Chief Complaint  Chief Complaint  Patient presents with  . Eye Problem    bumps near left eye, itchy, was swollen    HPI  Pt presents with 2 small bumps to the LEFT outer canthus with itching and irritation. She uses dry eye drops daily, and this does relieve some of the itching.  She denies vision changes, pain, bleeding/drainage, or eye irritation.  The "bumps" have been present for months, but the irritation started a few days ago.  Patient Active Problem List   Diagnosis Date Noted  . Vaginal atrophy 01/01/2017  . Hyperlipidemia LDL goal <100 10/04/2016  . Medication monitoring encounter 03/16/2016  . Vitamin D deficiency 03/11/2016  . Diverticula of colon 01/24/2016  . Hypertension goal BP (blood pressure) < 130/80 01/17/2016  . Impingement syndrome of right shoulder 01/03/2016  . Bursitis, trochanteric 11/22/2015  . Inversion, nipple 11/08/2015  . Diabetes mellitus type 2, controlled, without complications ([place]) 40/98/1191  . Rheumatoid arthritis involving both hands ([place]) 10/23/2015  . Benign neoplasm of descending colon   . Benign neoplasm of sigmoid colon   . Bipolar disorder, current episode depressed, moderate ([place]) 07/03/2015  . IBS (irritable bowel syndrome) 07/03/2015  . Insomnia, uncontrolled 07/03/2015  . GERD without esophagitis 07/03/2015  . Obesity (BMI 30-39.9) 07/03/2015  . Bilateral hip bursitis 07/03/2015    Social History  Substance Use Topics  . Smoking status: Former Smoker    Packs/day: 0.50    Types: Cigarettes    Quit date: 05/06/2015  . Smokeless tobacco: Never Used  . Alcohol use No     Current Outpatient Prescriptions:  .  ACCU-CHEK FASTCLIX LANCETS MISC, May check fingerstick blood sugar once a day if desired;  E11.9; bring readings to appt, Disp: 102 each, Rfl: 0 .  aspirin 81 MG tablet, Take 81 mg by mouth daily., Disp: , Rfl:   .  atorvastatin (LIPITOR) 10 MG tablet, take 1 tablet by mouth at bedtime, Disp: 30 tablet, Rfl: 2 .  B-D INS SYR ULTRAFINE 1CC/31G 31G X 5/16" 1 ML MISC, use as directed TO INJECT METHOTREXATE, Disp: , Rfl: 1 .  ezetimibe (ZETIA) 10 MG tablet, Take 1 tablet (10 mg total) by mouth daily., Disp: 90 tablet, Rfl: 3 .  folic acid (FOLVITE) 1 MG tablet, Take 1 mg by mouth daily., Disp: , Rfl:  .  glucose blood test strip, Use to check blood sugars twice a day; fluctuating sugars; Dx E11.65; LON 99 months, Disp: 200 each, Rfl: 3 .  LINZESS 290 MCG CAPS capsule, take 1 capsule by mouth once daily, Disp: 30 capsule, Rfl: 5 .  methotrexate 50 MG/2ML injection, inject 0.6 milliliter subcutaneously every week, Disp: , Rfl: 0 .  Multiple Vitamins-Minerals (MULTIVITAMIN/EXTRA VITAMIN D3 PO), Take by mouth., Disp: , Rfl:  .  OLANZapine (ZYPREXA) 15 MG tablet, Take 15 mg by mouth at bedtime., Disp: , Rfl: 0 .  traZODone (DESYREL) 150 MG tablet, Take 150 mg by mouth at bedtime., Disp: , Rfl: 0  Allergies  Allergen Reactions  . Ace Inhibitors Swelling    Angioedema (specifically from enalapril)  . Enalapril Swelling    angioedema  . Penicillins Hives and Swelling    Has patient had a PCN reaction causing immediate rash, facial/tongue/throat swelling, SOB or lightheadedness with hypotension: Yes Has patient had a PCN reaction causing severe  rash involving mucus membranes or skin necrosis: No Has patient had a PCN reaction that required hospitalization Yes Has patient had a PCN reaction occurring within the last 10 years: No If all of the above answers are "NO", then may proceed with Cephalosporin use.  . Lipitor [Atorvastatin] Itching    ROS  Constitutional: Negative for fever or weight change.  Respiratory: Negative for cough and shortness of breath.   HEENT: See HPI Cardiovascular: Negative for chest pain or palpitations.  Gastrointestinal: Negative for abdominal pain, no bowel changes.   Musculoskeletal: Negative for gait problem or joint swelling.  Skin: Negative for rash.  Neurological: Negative for dizziness or headache.  No other specific complaints in a complete review of systems (except as listed in HPI above).  Objective  Vitals:   07/11/17 1149  BP: 118/76  Pulse: 91  Resp: 18  Temp: 98.3 F (36.8 C)  TempSrc: Oral  SpO2: 96%  Weight: 212 lb 14.4 oz (96.6 kg)  Height: 5\' 7"  (1.702 m)   Body mass index is 33.34 kg/m.  Nursing Note and Vital Signs reviewed.  Physical Exam  Constitutional: Patient appears well-developed and well-nourished. Obese No distress.  HEENT: head atraumatic, normocephalic, pupils equal and reactive to light, EOM's intact, TM's without erythema or bulging, no maxillary or frontal sinus pain on palpation, neck supple without lymphadenopathy, oropharynx pink and moist without exudate.  No conjunctival erythema or drainage. Skin: Left outer canthus of the eye has two small flesh colored chalazion - one on the upper lid and one on the lower lid - neither are impeding vision.  No erythema, bleeding, or drainage. Cardiovascular: Normal rate, regular rhythm, S1/S2 present.  No murmur or rub heard. No BLE edema. Pulmonary/Chest: Effort normal and breath sounds clear. No respiratory distress or retractions. Psychiatric: Patient has a normal mood and affect. behavior is normal. Judgment and thought content normal.  Recent Results (from the past 2160 hour(s))  VITAMIN D 25 Hydroxy (Vit-D Deficiency, Fractures)     Status: None   Collection Time: 07/02/17  2:33 PM  Result Value Ref Range   Vit D, 25-Hydroxy 34 30 - 100 ng/mL    Comment: Vitamin D Status           25-OH Vitamin D        Deficiency                <20 ng/mL        Insufficiency         20 - 29 ng/mL        Optimal             > or = 30 ng/mL   For 25-OH Vitamin D testing on patients on D2-supplementation and patients for whom quantitation of D2 and D3 fractions is required,  the Quest[facility]ureD 25-OH VIT D, (D2,D3), LC/MS/MS is recommended: order code [phone] (patients > 2 yrs).   TSH     Status: None   Collection Time: 07/02/17  2:33 PM  Result Value Ref Range   TSH 0.63 mIU/L    Comment:   Reference Range   > or = 20 Years  0.40-4.50   Pregnancy Range First trimester  0.26-2.66 Second trimester 0.55-2.73 Third trimester  0.43-2.91     Hemoglobin A1c     Status: Abnormal   Collection Time: 07/02/17  2:33 PM  Result Value Ref Range   Hgb A1c MFr Bld 6.5 (H) <5.7 %    Comment:   For  someone without known diabetes, a hemoglobin A1c value of 6.5% or greater indicates that they may have diabetes and this should be confirmed with a follow-up test.   For someone with known diabetes, a value <7% indicates that their diabetes is well controlled and a value greater than or equal to 7% indicates suboptimal control. A1c targets should be individualized based on duration of diabetes, age, comorbid conditions, and other considerations.   Currently, no consensus exists for use of hemoglobin A1c for diagnosis of diabetes for children.      Mean Plasma Glucose 140 mg/dL     Assessment & Plan  1. Chalazion of left eye, unspecified eyelid - Ambulatory referral to Ophthalmology - Advised warm compresses and OTC moisturizing eye drops PRN for irritation and provided reassurance. Patient states because these have been present for months that she prefers referral to Ophthalmology for further evaluation and possible removal. - Patient declines additional antihistamine drops today. -Red flags and when to present for emergency care or RTC including fever >101.30F, new/worsening/un-resolving symptoms, vision changes, eye pain, eye redness/bleeding reviewed with patient at time of visit. Follow up and care instructions discussed and provided in AVS.  I have reviewed this encounter including the documentation in this note and/or discussed this patient with the  Johney Maine, FNP, NP-C. I am certifying that I agree with the content of this note as supervising physician.  Steele Sizer, MD [facility] 07/11/2017, 1:16 PM

## 2017-07-23 DIAGNOSIS — Z79899 Other long term (current) drug therapy: Secondary | ICD-10-CM | POA: Diagnosis not present

## 2017-07-29 ENCOUNTER — Ambulatory Visit: Payer: Medicare Other | Admitting: Family Medicine

## 2017-07-31 DIAGNOSIS — M7061 Trochanteric bursitis, right hip: Secondary | ICD-10-CM | POA: Diagnosis not present

## 2017-07-31 DIAGNOSIS — Z79899 Other long term (current) drug therapy: Secondary | ICD-10-CM | POA: Diagnosis not present

## 2017-07-31 DIAGNOSIS — M7541 Impingement syndrome of right shoulder: Secondary | ICD-10-CM | POA: Diagnosis not present

## 2017-07-31 DIAGNOSIS — M0609 Rheumatoid arthritis without rheumatoid factor, multiple sites: Secondary | ICD-10-CM | POA: Diagnosis not present

## 2017-07-31 DIAGNOSIS — M7062 Trochanteric bursitis, left hip: Secondary | ICD-10-CM | POA: Diagnosis not present

## 2017-08-06 DIAGNOSIS — Z23 Encounter for immunization: Secondary | ICD-10-CM | POA: Diagnosis not present

## 2017-08-15 DIAGNOSIS — L729 Follicular cyst of the skin and subcutaneous tissue, unspecified: Secondary | ICD-10-CM | POA: Diagnosis not present

## 2017-08-26 ENCOUNTER — Ambulatory Visit (INDEPENDENT_AMBULATORY_CARE_PROVIDER_SITE_OTHER): Payer: Medicare Other | Admitting: Family Medicine

## 2017-08-26 ENCOUNTER — Encounter: Payer: Self-pay | Admitting: Family Medicine

## 2017-08-26 VITALS — BP 118/72 | HR 94 | Temp 98.1°F | Resp 16 | Wt 216.5 lb

## 2017-08-26 DIAGNOSIS — M5440 Lumbago with sciatica, unspecified side: Secondary | ICD-10-CM

## 2017-08-26 DIAGNOSIS — F3132 Bipolar disorder, current episode depressed, moderate: Secondary | ICD-10-CM

## 2017-08-26 DIAGNOSIS — G8929 Other chronic pain: Secondary | ICD-10-CM

## 2017-08-26 DIAGNOSIS — E119 Type 2 diabetes mellitus without complications: Secondary | ICD-10-CM

## 2017-08-26 MED ORDER — PREDNISONE 10 MG (48) PO TBPK: ORAL_TABLET | ORAL | 0 refills | Status: DC

## 2017-08-26 MED ORDER — GABAPENTIN 300 MG PO CAPS: 300.0000 mg | ORAL_CAPSULE | Freq: Three times a day (TID) | ORAL | 0 refills | Status: DC

## 2017-08-26 MED ORDER — CYCLOBENZAPRINE HCL 5 MG PO TABS: 5.0000 mg | ORAL_TABLET | Freq: Every day | ORAL | 1 refills | Status: DC

## 2017-08-26 NOTE — Progress Notes (Signed)
Name: Elizabeth [name]   MRN: [id number]    DOB: October 20, 1966   Date:08/26/2017       Progress Note  Subjective  Chief Complaint  Chief Complaint  Patient presents with  . Back Pain    lower back pain that radiates down into her leg and sometimes up into her arm    HPI  Low back pain with radiculitis: she states symptoms suddenly after she started a dance aerobic at home. It started about 2 months ago, recently seen by Dr. Meda Coffee and given Flexeril, Neurontin and Meloxicam, however she states pain continues to shoot down both legs, currently down right lower leg/posteriorly, She denies bowel or bladder incontinence. It is affecting her sleep and burned her back with a heating pad a few days ago  ( healing now) . She is walking slower than usual and has difficulty getting out of chair when sitting.   DM: last hgbA1C 6.5%, fsbs at home fasting around 114. No polyphagia, polydipsia or polyuria  Bipolar: she states she is doing well, taking medication as prescribed, no previous history of going manic while on prednisone.   Patient Active Problem List   Diagnosis Date Noted  . Vaginal atrophy 01/01/2017  . Hyperlipidemia LDL goal <100 10/04/2016  . Medication monitoring encounter 03/16/2016  . Vitamin D deficiency 03/11/2016  . Diverticula of colon 01/24/2016  . Hypertension goal BP (blood pressure) < 130/80 01/17/2016  . Impingement syndrome of right shoulder 01/03/2016  . Bursitis, trochanteric 11/22/2015  . Inversion, nipple 11/08/2015  . Diabetes mellitus type 2, controlled, without complications ([place]) 78/29/5621  . Rheumatoid arthritis involving both hands ([place]) 10/23/2015  . Benign neoplasm of descending colon   . Benign neoplasm of sigmoid colon   . Bipolar disorder, current episode depressed, moderate ([place]) 07/03/2015  . IBS (irritable bowel syndrome) 07/03/2015  . Insomnia, uncontrolled 07/03/2015  . GERD without esophagitis 07/03/2015  . Obesity (BMI 30-39.9) 07/03/2015  .  Bilateral hip bursitis 07/03/2015    Past Surgical History:  Procedure Laterality Date  . BOWEL RESECTION    . CHOLECYSTECTOMY  1997   [place], [place]  . COLONOSCOPY WITH PROPOFOL N/A 09/07/2015   Procedure: COLONOSCOPY WITH PROPOFOL;  Surgeon: Lucilla Lame, MD;  Location: [place];  Service: Endoscopy;  Laterality: N/A;  . ESOPHAGOGASTRODUODENOSCOPY (EGD) WITH ESOPHAGEAL DILATION    . ESOPHAGOGASTRODUODENOSCOPY (EGD) WITH PROPOFOL N/A 09/07/2015   Procedure: ESOPHAGOGASTRODUODENOSCOPY (EGD) WITH PROPOFOL;  Surgeon: Lucilla Lame, MD;  Location: [place];  Service: Endoscopy;  Laterality: N/A;  . PARTIAL COLECTOMY N/A 01/24/2016   Procedure: SIGMOID COLECTOMY;  Surgeon: Florene Glen, MD;  Location: [facility];  Service: General;  Laterality: N/A;  . POLYPECTOMY N/A 09/07/2015   Procedure: POLYPECTOMY;  Surgeon: Lucilla Lame, MD;  Location: [place];  Service: Endoscopy;  Laterality: N/A;  . TUBAL LIGATION      Family History  Problem Relation Age of Onset  . Hypertension Mother   . Diabetes Mother   . Arthritis/Rheumatoid Mother   . Hyperlipidemia Mother   . Alcohol abuse Father   . Esophageal varices Father   . Diverticulosis Maternal Aunt   . Diverticulosis Maternal Grandmother   . Diabetes Maternal Grandmother   . Heart disease Maternal Grandmother   . Dementia Maternal Grandmother   . Diabetes Brother   . Aneurysm Maternal Grandfather   . Diabetes Paternal Grandmother   . Depression Daughter        bipolar disorder    Social History  Social History  . Marital status: Married    Spouse name: N/A  . Number of children: N/A  . Years of education: N/A   Occupational History  . Not on file.   Social History Main Topics  . Smoking status: Former Smoker    Packs/day: 0.50    Types: Cigarettes    Quit date: 05/06/2015  . Smokeless tobacco: Never Used  . Alcohol use No  . Drug use: No  . Sexual activity: Yes    Birth control/ protection:  None   Other Topics Concern  . Not on file   Social History Narrative  . No narrative on file     Current Outpatient Prescriptions:  .  ACCU-CHEK FASTCLIX LANCETS MISC, May check fingerstick blood sugar once a day if desired;  E11.9; bring readings to appt, Disp: 102 each, Rfl: 0 .  aspirin 81 MG tablet, Take 81 mg by mouth daily., Disp: , Rfl:  .  atorvastatin (LIPITOR) 10 MG tablet, take 1 tablet by mouth at bedtime, Disp: 30 tablet, Rfl: 2 .  B-D INS SYR ULTRAFINE 1CC/31G 31G X 5/16" 1 ML MISC, use as directed TO INJECT METHOTREXATE, Disp: , Rfl: 1 .  cyclobenzaprine (FLEXERIL) 5 MG tablet, Take 1 tablet (5 mg total) by mouth at bedtime., Disp: 30 tablet, Rfl: 1 .  ezetimibe (ZETIA) 10 MG tablet, Take 1 tablet (10 mg total) by mouth daily., Disp: 90 tablet, Rfl: 3 .  folic acid (FOLVITE) 1 MG tablet, Take 1 mg by mouth daily., Disp: , Rfl:  .  gabapentin (NEURONTIN) 300 MG capsule, Take 1 capsule (300 mg total) by mouth 3 (three) times daily., Disp: 30 capsule, Rfl: 0 .  glucose blood test strip, Use to check blood sugars twice a day; fluctuating sugars; Dx E11.65; LON 99 months, Disp: 200 each, Rfl: 3 .  LINZESS 290 MCG CAPS capsule, take 1 capsule by mouth once daily, Disp: 30 capsule, Rfl: 5 .  methotrexate 50 MG/2ML injection, inject 0.6 milliliter subcutaneously every week, Disp: , Rfl: 0 .  Multiple Vitamins-Minerals (MULTIVITAMIN/EXTRA VITAMIN D3 PO), Take by mouth., Disp: , Rfl:  .  OLANZapine (ZYPREXA) 15 MG tablet, Take 15 mg by mouth at bedtime., Disp: , Rfl: 0 .  predniSONE (STERAPRED UNI-PAK 48 TAB) 10 MG (48) TBPK tablet, Take as directed Needs to stop Meloxicam, Disp: 48 tablet, Rfl: 0 .  traZODone (DESYREL) 150 MG tablet, Take 150 mg by mouth at bedtime., Disp: , Rfl: 0  Allergies  Allergen Reactions  . Ace Inhibitors Swelling    Angioedema (specifically from enalapril)  . Enalapril Swelling    angioedema  . Penicillins Hives and Swelling    Has patient had a PCN  reaction causing immediate rash, facial/tongue/throat swelling, SOB or lightheadedness with hypotension: Yes Has patient had a PCN reaction causing severe rash involving mucus membranes or skin necrosis: No Has patient had a PCN reaction that required hospitalization Yes Has patient had a PCN reaction occurring within the last 10 years: No If all of the above answers are "NO", then may proceed with Cephalosporin use.  . Lipitor [Atorvastatin] Itching     ROS  Constitutional: Negative for fever or weight change.  Respiratory: Negative for cough and shortness of breath.   Cardiovascular: Negative for chest pain or palpitations.  Gastrointestinal: Negative for abdominal pain, no bowel changes.  Musculoskeletal: Positive  for gait problem but no  joint swelling.  Skin: Negative for rash.  Neurological: Negative for dizziness or headache.  No other specific complaints in a complete review of systems (except as listed in HPI above).  Objective  Vitals:   08/26/17 0953  BP: 118/72  Pulse: 94  Resp: 16  Temp: 98.1 F (36.7 C)  SpO2: 97%  Weight: 216 lb 8 oz (98.2 kg)    Body mass index is 33.91 kg/m.  Physical Exam  Constitutional: Patient appears well-developed and well-nourished. Obese  No distress.  HEENT: head atraumatic, normocephalic, pupils equal and reactive to light,  neck supple, throat within normal limits Cardiovascular: Normal rate, regular rhythm and normal heart sounds.  No murmur heard. No BLE edema. Pulmonary/Chest: Effort normal and breath sounds normal. No respiratory distress. Abdominal: Soft.  There is no tenderness. Skin: hyperpigmentation on right lower back from recent burn from heating pad, no blister Muscular Skeletal: very tender during palpation of lumbar spine, worse on right lower back and buttocks, negative straight leg raise, standing with wide base, and leaning forward, decrease rom on all directions Psychiatric: Patient has a normal mood and  affect. behavior is normal. Judgment and thought content normal.  Recent Results (from the past 2160 hour(s))  VITAMIN D 25 Hydroxy (Vit-D Deficiency, Fractures)     Status: None   Collection Time: 07/02/17  2:33 PM  Result Value Ref Range   Vit D, 25-Hydroxy 34 30 - 100 ng/mL    Comment: Vitamin D Status           25-OH Vitamin D        Deficiency                <20 ng/mL        Insufficiency         20 - 29 ng/mL        Optimal             > or = 30 ng/mL   For 25-OH Vitamin D testing on patients on D2-supplementation and patients for whom quantitation of D2 and D3 fractions is required, the QuestAssureD 25-OH VIT D, (D2,D3), LC/MS/MS is recommended: order code [phone] [phone] (patients > 2 yrs).   TSH     Status: None   Collection Time: 07/02/17  2:33 PM  Result Value Ref Range   TSH 0.63 mIU/L    Comment:   Reference Range   > or = 20 Years  0.40-4.50   Pregnancy Range First trimester  0.26-2.66 Second trimester 0.55-2.73 Third trimester  0.43-2.91     Hemoglobin A1c     Status: Abnormal   Collection Time: 07/02/17  2:33 PM  Result Value Ref Range   Hgb A1c MFr Bld 6.5 (H) <5.7 %    Comment:   For someone without known diabetes, a hemoglobin A1c value of 6.5% or greater indicates that they may have diabetes and this should be confirmed with a follow-up test.   For someone with known diabetes, a value <7% indicates that their diabetes is well controlled and a value greater than or equal to 7% indicates suboptimal control. A1c targets should be individualized based on duration of diabetes, age, comorbid conditions, and other considerations.   Currently, no consensus exists for use of hemoglobin A1c for diagnosis of diabetes for children.      Mean Plasma Glucose 140 mg/dL      PHQ2/9: Depression screen [facility] 2/9 07/02/2017 04/01/2017 01/01/2017 09/09/2016 06/10/2016  Decreased Interest 0 0 0 0 0  Down, Depressed, Hopeless 0 0 0 0 3  PHQ - 2 Score 0 0  0 0 3  Altered  sleeping - - - - 3  Tired, decreased energy - - - - 1  Change in appetite - - - - 0  Feeling bad or failure about yourself  - - - - 0  Trouble concentrating - - - - 2  Moving slowly or fidgety/restless - - - - 0  Suicidal thoughts - - - - 0  PHQ-9 Score - - - - 9     Fall Risk: Fall Risk  07/02/2017 04/01/2017 01/01/2017 09/09/2016 06/10/2016  Falls in the past year? No No No No No   Assessment & Plan  1. Chronic midline low back pain with sciatica, sciatica laterality unspecified  This episode started about 2 months ago, she is now on Gabapentin 200 mg and Meloxicam ( advised her to stop meloxicam) we will increase dose of Neurontin to 300 mg qhs, continue Flexeril and try a prednisone taper to see if it will control symptoms of radiculitis, advised her to follow up with Dr. Meda Coffee or Dr. Sanda Klein if no improvement for possible MRI lumbar spine - gabapentin (NEURONTIN) 300 MG capsule; Take 1 capsule (300 mg total) by mouth 3 (three) times daily.  Dispense: 30 capsule; Refill: 0 - cyclobenzaprine (FLEXERIL) 5 MG tablet; Take 1 tablet (5 mg total) by mouth at bedtime.  Dispense: 30 tablet; Refill: 1 - predniSONE (STERAPRED UNI-PAK 48 TAB) 10 MG (48) TBPK tablet; Take as directed Needs to stop Meloxicam  Dispense: 48 tablet; Refill: 0  She has a history of bipolar depression, but states tolerated prednisone in the past without problems.   2. Controlled type 2 diabetes mellitus without complication, without long-term current use of insulin (HCC)  Last hgbA1C was 6.5%, discussed importance of checking glucose more often and be very strict with diet to avoid glucose going too high

## 2017-09-15 ENCOUNTER — Ambulatory Visit (INDEPENDENT_AMBULATORY_CARE_PROVIDER_SITE_OTHER): Payer: Medicare Other | Admitting: General Surgery

## 2017-09-15 ENCOUNTER — Encounter: Payer: Self-pay | Admitting: Family Medicine

## 2017-09-15 ENCOUNTER — Ambulatory Visit (INDEPENDENT_AMBULATORY_CARE_PROVIDER_SITE_OTHER): Payer: Medicare Other | Admitting: Family Medicine

## 2017-09-15 ENCOUNTER — Encounter: Payer: Self-pay | Admitting: General Surgery

## 2017-09-15 VITALS — BP 120/60 | HR 97 | Resp 14 | Ht 67.0 in | Wt 218.3 lb

## 2017-09-15 VITALS — BP 142/78 | HR 72 | Resp 14 | Ht 67.0 in | Wt 218.0 lb

## 2017-09-15 DIAGNOSIS — G8929 Other chronic pain: Secondary | ICD-10-CM

## 2017-09-15 DIAGNOSIS — M5440 Lumbago with sciatica, unspecified side: Secondary | ICD-10-CM

## 2017-09-15 DIAGNOSIS — K644 Residual hemorrhoidal skin tags: Secondary | ICD-10-CM

## 2017-09-15 MED ORDER — HYDROCORTISONE 2.5 % RE CREA: 1.0000 "application " | TOPICAL_CREAM | Freq: Two times a day (BID) | RECTAL | 1 refills | Status: DC

## 2017-09-15 MED ORDER — GABAPENTIN 300 MG PO CAPS: 300.0000 mg | ORAL_CAPSULE | Freq: Three times a day (TID) | ORAL | 0 refills | Status: DC

## 2017-09-15 NOTE — Addendum Note (Signed)
Addended by: Steele Sizer F on: 09/15/2017 02:49 PM   Modules accepted: Orders

## 2017-09-15 NOTE — Patient Instructions (Signed)
Apply Anusol twice a day to rectum. Return in 2 weeks for follow up.

## 2017-09-15 NOTE — Progress Notes (Signed)
Patient ID: Elizabeth [name], female   DOB: 02-02-1966, 51 y.o.   MRN: [id number]  Chief Complaint  Patient presents with  . Hemorrhoids    HPI Elizabeth [name] is a 51 y.o. female here for evaluation of a hemorrhoid. Patient states she has been having since Wednesday. She has tried all kinds on medication (preparation H and salt baths) and states it has reduced in size. She states that she was using a chainsaw and exerting herself trying to push a log when she felt something pop in her rectal area.  HPI  Past Medical History:  Diagnosis Date  . Bipolar 1 disorder ([place])   . Depression   . Diabetes mellitus without complication ([place])   . Diverticulosis   . GERD (gastroesophageal reflux disease)   . Heart murmur    followed by PCP  . Hepatitis    B - "not active"  . Hypertension goal BP (blood pressure) < 130/80 01/17/2016  . Insomnia   . Irritable bowel syndrome   . Motion sickness    boats  . Obesity (BMI 30-39.9) 07/03/2015  . RA (rheumatoid arthritis) ([place])   . Sciatica     Past Surgical History:  Procedure Laterality Date  . BOWEL RESECTION    . CHOLECYSTECTOMY  1997   [place], [place]  . COLONOSCOPY WITH PROPOFOL N/A 09/07/2015   Procedure: COLONOSCOPY WITH PROPOFOL;  Surgeon: Lucilla Lame, MD;  Location: [place];  Service: Endoscopy;  Laterality: N/A;  . ESOPHAGOGASTRODUODENOSCOPY (EGD) WITH ESOPHAGEAL DILATION    . ESOPHAGOGASTRODUODENOSCOPY (EGD) WITH PROPOFOL N/A 09/07/2015   Procedure: ESOPHAGOGASTRODUODENOSCOPY (EGD) WITH PROPOFOL;  Surgeon: Lucilla Lame, MD;  Location: [place];  Service: Endoscopy;  Laterality: N/A;  . PARTIAL COLECTOMY N/A 01/24/2016   Procedure: SIGMOID COLECTOMY;  Surgeon: Florene Glen, MD;  Location: [facility];  Service: General;  Laterality: N/A;  . POLYPECTOMY N/A 09/07/2015   Procedure: POLYPECTOMY;  Surgeon: Lucilla Lame, MD;  Location: [place];  Service: Endoscopy;  Laterality: N/A;  . TUBAL LIGATION      Family  History  Problem Relation Age of Onset  . Hypertension Mother   . Diabetes Mother   . Arthritis/Rheumatoid Mother   . Hyperlipidemia Mother   . Alcohol abuse Father   . Esophageal varices Father   . Diverticulosis Maternal Aunt   . Diverticulosis Maternal Grandmother   . Diabetes Maternal Grandmother   . Heart disease Maternal Grandmother   . Dementia Maternal Grandmother   . Diabetes Brother   . Aneurysm Maternal Grandfather   . Diabetes Paternal Grandmother   . Depression Daughter        bipolar disorder    Social History Social History  Substance Use Topics  . Smoking status: Former Smoker    Packs/day: 0.50    Types: Cigarettes    Quit date: 05/06/2015  . Smokeless tobacco: Never Used  . Alcohol use No    Allergies  Allergen Reactions  . Ace Inhibitors Swelling    Angioedema (specifically from enalapril)  . Enalapril Swelling    angioedema  . Penicillins Hives and Swelling    Has patient had a PCN reaction causing immediate rash, facial/tongue/throat swelling, SOB or lightheadedness with hypotension: Yes Has patient had a PCN reaction causing severe rash involving mucus membranes or skin necrosis: No Has patient had a PCN reaction that required hospitalization Yes Has patient had a PCN reaction occurring within the last 10 years: No If all of the above answers are "  NO", then may proceed with Cephalosporin use.  . Lipitor [Atorvastatin] Itching    Current Outpatient Prescriptions  Medication Sig Dispense Refill  . ACCU-CHEK FASTCLIX LANCETS MISC May check fingerstick blood sugar once a day if desired;  E11.9; bring readings to appt 102 each 0  . aspirin 81 MG tablet Take 81 mg by mouth daily.    Marland Kitchen atorvastatin (LIPITOR) 10 MG tablet take 1 tablet by mouth at bedtime 30 tablet 2  . B-D INS SYR ULTRAFINE 1CC/31G 31G X 5/16" 1 ML MISC use as directed TO INJECT METHOTREXATE  1  . cyclobenzaprine (FLEXERIL) 5 MG tablet Take 1 tablet (5 mg total) by mouth at bedtime.  30 tablet 1  . ezetimibe (ZETIA) 10 MG tablet Take 1 tablet (10 mg total) by mouth daily. 90 tablet 3  . folic acid (FOLVITE) 1 MG tablet Take 1 mg by mouth daily.    Marland Kitchen gabapentin (NEURONTIN) 300 MG capsule Take 1 capsule (300 mg total) by mouth 3 (three) times daily. 90 capsule 0  . glucose blood test strip Use to check blood sugars twice a day; fluctuating sugars; Dx E11.65; LON 99 months 200 each 3  . LINZESS 290 MCG CAPS capsule take 1 capsule by mouth once daily 30 capsule 5  . methotrexate 50 MG/2ML injection inject 0.6 milliliter subcutaneously every week  0  . Multiple Vitamins-Minerals (MULTIVITAMIN/EXTRA VITAMIN D3 PO) Take by mouth.    . OLANZapine (ZYPREXA) 15 MG tablet Take 15 mg by mouth at bedtime.  0  . traZODone (DESYREL) 150 MG tablet Take 150 mg by mouth at bedtime.  0  . hydrocortisone (ANUSOL-HC) 2.5 % rectal cream Place 1 application rectally 2 (two) times daily. 30 g 1   No current facility-administered medications for this visit.     Review of Systems Review of Systems  Constitutional: Negative.   Respiratory: Negative.   Cardiovascular: Negative.     Blood pressure (!) 142/78, pulse 72, resp. rate 14, height 5\' 7"  (1.702 m), weight 218 lb (98.9 kg), last menstrual period 07/09/2014.  Physical Exam Physical Exam  Constitutional: She is oriented to person, place, and time. She appears well-developed and well-nourished.  Eyes: Conjunctivae are normal. No scleral icterus.  Abdominal: Soft. There is no tenderness.  Genitourinary:     Lymphadenopathy:       Right: No inguinal adenopathy present.       Left: No inguinal adenopathy present.  Neurological: She is alert and oriented to person, place, and time.  Skin: Skin is warm and dry.  Psychiatric: She has a normal mood and affect. Her behavior is normal.    Data Reviewed None  Assessment    External hemorrhoid - left sided and swollen, not thrombosed. Improving some with application of Preparation H  and sitz baths.    Plan     Prescribed Anusol-HC to apply twice daily. Return to clinic in 2 weeks for reevaluation.    HPI, Physical Exam, Assessment and Plan have been scribed under the direction and in the presence of Mckinley Jewel, MD  Concepcion Living, LPN  I have completed the exam and reviewed the above documentation for accuracy and completeness.  I agree with the above.  Haematologist has been used and any errors in dictation or transcription are unintentional.  Dezeray Puccio G. Jamal Collin, M.D., F.A.C.S.   Junie Panning G 09/15/2017, 4:22 PM

## 2017-09-15 NOTE — Progress Notes (Addendum)
Name: Elizabeth [name]   MRN: [id number]    DOB: December 31, 1965   Date:09/15/2017       Progress Note  Subjective  Chief Complaint  Chief Complaint  Patient presents with  . Hemorrhoids    HPI  External hemorrhoid: she was cutting a tree in her yard 5 days ago and heard a pop and rectal pain, she states she continued to work in her yard but when she sat down to use the bathroom she noticed large lump that was very tender to touch. No blood in stools. Pain is affecting her ability to sit down, pain right now is 10/10 She has tried topical medication, sitz baths and frozen ice. She states the size seems to be going down, but she is is a lot of discomfort and came in today for help   Chronic back pain: doing better with gabapentin 3 times daily and after she finished prednisone taper  Patient Active Problem List   Diagnosis Date Noted  . Vaginal atrophy 01/01/2017  . Hyperlipidemia LDL goal <100 10/04/2016  . Medication monitoring encounter 03/16/2016  . Vitamin D deficiency 03/11/2016  . Diverticula of colon 01/24/2016  . Hypertension goal BP (blood pressure) < 130/80 01/17/2016  . Impingement syndrome of right shoulder 01/03/2016  . Bursitis, trochanteric 11/22/2015  . Inversion, nipple 11/08/2015  . Diabetes mellitus type 2, controlled, without complications ([place]) 68/34/1962  . Rheumatoid arthritis involving both hands ([place]) 10/23/2015  . Benign neoplasm of descending colon   . Benign neoplasm of sigmoid colon   . Bipolar disorder, current episode depressed, moderate ([place]) 07/03/2015  . IBS (irritable bowel syndrome) 07/03/2015  . Insomnia, uncontrolled 07/03/2015  . GERD without esophagitis 07/03/2015  . Obesity (BMI 30-39.9) 07/03/2015  . Bilateral hip bursitis 07/03/2015    Past Surgical History:  Procedure Laterality Date  . BOWEL RESECTION    . CHOLECYSTECTOMY  1997   [place]  . COLONOSCOPY WITH PROPOFOL N/A 09/07/2015   Procedure: COLONOSCOPY WITH PROPOFOL;  Surgeon:  Lucilla Lame, MD;  Location: [facility];  Service: Endoscopy;  Laterality: N/A;  . ESOPHAGOGASTRODUODENOSCOPY (EGD) WITH ESOPHAGEAL DILATION    . ESOPHAGOGASTRODUODENOSCOPY (EGD) WITH PROPOFOL N/A 09/07/2015   Procedure: ESOPHAGOGASTRODUODENOSCOPY (EGD) WITH PROPOFOL;  Surgeon: Lucilla Lame, MD;  Location: [place];  Service: Endoscopy;  Laterality: N/A;  . PARTIAL COLECTOMY N/A 01/24/2016   Procedure: SIGMOID COLECTOMY;  Surgeon: Florene Glen, MD;  Location: [facility];  Service: General;  Laterality: N/A;  . POLYPECTOMY N/A 09/07/2015   Procedure: POLYPECTOMY;  Surgeon: Lucilla Lame, MD;  Location: [facility];  Service: Endoscopy;  Laterality: N/A;  . TUBAL LIGATION      Family History  Problem Relation Age of Onset  . Hypertension Mother   . Diabetes Mother   . Arthritis/Rheumatoid Mother   . Hyperlipidemia Mother   . Alcohol abuse Father   . Esophageal varices Father   . Diverticulosis Maternal Aunt   . Diverticulosis Maternal Grandmother   . Diabetes Maternal Grandmother   . Heart disease Maternal Grandmother   . Dementia Maternal Grandmother   . Diabetes Brother   . Aneurysm Maternal Grandfather   . Diabetes Paternal Grandmother   . Depression Daughter        bipolar disorder    Social History   Social History  . Marital status: Married    Spouse name: N/A  . Number of children: N/A  . Years of education: N/A   Occupational History  . Not  on file.   Social History Main Topics  . Smoking status: Former Smoker    Packs/day: 0.50    Types: Cigarettes    Quit date: 05/06/2015  . Smokeless tobacco: Never Used  . Alcohol use No  . Drug use: No  . Sexual activity: Yes    Birth control/ protection: None   Other Topics Concern  . Not on file   Social History Narrative  . No narrative on file     Current Outpatient Prescriptions:  .  ACCU-CHEK FASTCLIX LANCETS MISC, May check fingerstick blood sugar once a day if desired;  E11.9; bring  readings to appt, Disp: 102 each, Rfl: 0 .  aspirin 81 MG tablet, Take 81 mg by mouth daily., Disp: , Rfl:  .  atorvastatin (LIPITOR) 10 MG tablet, take 1 tablet by mouth at bedtime, Disp: 30 tablet, Rfl: 2 .  B-D INS SYR ULTRAFINE 1CC/31G 31G X 5/16" 1 ML MISC, use as directed TO INJECT METHOTREXATE, Disp: , Rfl: 1 .  cyclobenzaprine (FLEXERIL) 5 MG tablet, Take 1 tablet (5 mg total) by mouth at bedtime., Disp: 30 tablet, Rfl: 1 .  ezetimibe (ZETIA) 10 MG tablet, Take 1 tablet (10 mg total) by mouth daily., Disp: 90 tablet, Rfl: 3 .  folic acid (FOLVITE) 1 MG tablet, Take 1 mg by mouth daily., Disp: , Rfl:  .  gabapentin (NEURONTIN) 300 MG capsule, Take 1 capsule (300 mg total) by mouth 3 (three) times daily., Disp: 30 capsule, Rfl: 0 .  glucose blood test strip, Use to check blood sugars twice a day; fluctuating sugars; Dx E11.65; LON 99 months, Disp: 200 each, Rfl: 3 .  LINZESS 290 MCG CAPS capsule, take 1 capsule by mouth once daily, Disp: 30 capsule, Rfl: 5 .  methotrexate 50 MG/2ML injection, inject 0.6 milliliter subcutaneously every week, Disp: , Rfl: 0 .  Multiple Vitamins-Minerals (MULTIVITAMIN/EXTRA VITAMIN D3 PO), Take by mouth., Disp: , Rfl:  .  OLANZapine (ZYPREXA) 15 MG tablet, Take 15 mg by mouth at bedtime., Disp: , Rfl: 0 .  traZODone (DESYREL) 150 MG tablet, Take 150 mg by mouth at bedtime., Disp: , Rfl: 0  Allergies  Allergen Reactions  . Ace Inhibitors Swelling    Angioedema (specifically from enalapril)  . Enalapril Swelling    angioedema  . Penicillins Hives and Swelling    Has patient had a PCN reaction causing immediate rash, facial/tongue/throat swelling, SOB or lightheadedness with hypotension: Yes Has patient had a PCN reaction causing severe rash involving mucus membranes or skin necrosis: No Has patient had a PCN reaction that required hospitalization Yes Has patient had a PCN reaction occurring within the last 10 years: No If all of the above answers are "NO",  then may proceed with Cephalosporin use.  . Lipitor [Atorvastatin] Itching     ROS  Ten systems reviewed and is negative except as mentioned in HPI   Objective  Vitals:   09/15/17 1349  BP: 120/60  Pulse: 97  Resp: 14  SpO2: 98%  Weight: 218 lb 4.8 oz (99 kg)  Height: 5\' 7"  (1.702 m)    Body mass index is 34.19 kg/m.  Physical Exam  Constitutional: Patient appears well-developed and well-nourished. Obese  No distress.  HEENT: head atraumatic, normocephalic, pupils equal and reactive to light,  neck supple, throat within normal limits Cardiovascular: Normal rate, regular rhythm and normal heart sounds.  No murmur heard. No BLE edema. Pulmonary/Chest: Effort normal and breath sounds normal. No respiratory distress. Abdominal:  Soft.  There is no tenderness. Rectal exam not done secondary to severe pain, inflamed and enlarged external hemorrhoid.  Psychiatric: Patient has a normal mood and affect. behavior is normal. Judgment and thought content normal.  Recent Results (from the past 2160 hour(s))  VITAMIN D 25 Hydroxy (Vit-D Deficiency, Fractures)     Status: None   Collection Time: 07/02/17  2:33 PM  Result Value Ref Range   Vit D, 25-Hydroxy 34 30 - 100 ng/mL    Comment: Vitamin D Status           25-OH Vitamin D        Deficiency                <20 ng/mL        Insufficiency         20 - 29 ng/mL        Optimal             > or = 30 ng/mL   For 25-OH Vitamin D testing on patients on D2-supplementation and patients for whom quantitation of D2 and D3 fractions is required, the QuestAssureD 25-OH VIT D, (D2,D3), LC/MS/MS is recommended: order code [id number] (patients > 2 yrs).   TSH     Status: None   Collection Time: 07/02/17  2:33 PM  Result Value Ref Range   TSH 0.63 mIU/L    Comment:   Reference Range   > or = 20 Years  0.40-4.50   Pregnancy Range First trimester  0.26-2.66 Second trimester 0.55-2.73 Third trimester  0.43-2.91     Hemoglobin A1c     Status:  Abnormal   Collection Time: 07/02/17  2:33 PM  Result Value Ref Range   Hgb A1c MFr Bld 6.5 (H) <5.7 %    Comment:   For someone without known diabetes, a hemoglobin A1c value of 6.5% or greater indicates that they may have diabetes and this should be confirmed with a follow-up test.   For someone with known diabetes, a value <7% indicates that their diabetes is well controlled and a value greater than or equal to 7% indicates suboptimal control. A1c targets should be individualized based on duration of diabetes, age, comorbid conditions, and other considerations.   Currently, no consensus exists for use of hemoglobin A1c for diagnosis of diabetes for children.      Mean Plasma Glucose 140 mg/dL      PHQ2/9: Depression screen [facility] 2/9 07/02/2017 04/01/2017 01/01/2017 09/09/2016 06/10/2016  Decreased Interest 0 0 0 0 0  Down, Depressed, Hopeless 0 0 0 0 3  PHQ - 2 Score 0 0 0 0 3  Altered sleeping - - - - 3  Tired, decreased energy - - - - 1  Change in appetite - - - - 0  Feeling bad or failure about yourself  - - - - 0  Trouble concentrating - - - - 2  Moving slowly or fidgety/restless - - - - 0  Suicidal thoughts - - - - 0  PHQ-9 Score - - - - 9     Fall Risk: Fall Risk  07/02/2017 04/01/2017 01/01/2017 09/09/2016 06/10/2016  Falls in the past year? No No No No No     Assessment & Plan  1. Inflamed external hemorrhoid  In severe pain, unable to do rectal exam, gently touching external hemorrhoid made her jump -referral surgeon today  2. Chronic midline low back pain with sciatica, sciatica laterality unspecified  - gabapentin (NEURONTIN) 300 MG  capsule; Take 1 capsule (300 mg total) by mouth 3 (three) times daily.  Dispense: 30 capsule; Refill: 0

## 2017-09-15 NOTE — Addendum Note (Signed)
Addended by: Steele Sizer F on: 09/15/2017 02:51 PM   Modules accepted: Orders

## 2017-09-29 ENCOUNTER — Ambulatory Visit: Payer: Medicare Other | Admitting: General Surgery

## 2017-10-03 DIAGNOSIS — F250 Schizoaffective disorder, bipolar type: Secondary | ICD-10-CM | POA: Diagnosis not present

## 2017-10-06 ENCOUNTER — Other Ambulatory Visit: Payer: Self-pay | Admitting: Family Medicine

## 2017-10-09 ENCOUNTER — Ambulatory Visit
Admission: RE | Admit: 2017-10-09 | Discharge: 2017-10-09 | Disposition: A | Discharge status: Home / Self Care | Payer: Medicare Other | Source: Ambulatory Visit | Attending: Family Medicine | Admitting: Family Medicine

## 2017-10-09 ENCOUNTER — Ambulatory Visit (INDEPENDENT_AMBULATORY_CARE_PROVIDER_SITE_OTHER): Payer: Medicare Other | Admitting: Family Medicine

## 2017-10-09 ENCOUNTER — Encounter: Payer: Self-pay | Admitting: Family Medicine

## 2017-10-09 VITALS — BP 110/68 | HR 86 | Resp 14 | Ht 67.0 in | Wt 214.3 lb

## 2017-10-09 DIAGNOSIS — M5440 Lumbago with sciatica, unspecified side: Secondary | ICD-10-CM

## 2017-10-09 DIAGNOSIS — N760 Acute vaginitis: Secondary | ICD-10-CM | POA: Diagnosis not present

## 2017-10-09 DIAGNOSIS — R05 Cough: Secondary | ICD-10-CM

## 2017-10-09 DIAGNOSIS — G8929 Other chronic pain: Secondary | ICD-10-CM | POA: Diagnosis not present

## 2017-10-09 DIAGNOSIS — E119 Type 2 diabetes mellitus without complications: Secondary | ICD-10-CM | POA: Diagnosis not present

## 2017-10-09 DIAGNOSIS — E669 Obesity, unspecified: Secondary | ICD-10-CM

## 2017-10-09 DIAGNOSIS — R0989 Other specified symptoms and signs involving the circulatory and respiratory systems: Secondary | ICD-10-CM | POA: Insufficient documentation

## 2017-10-09 DIAGNOSIS — E66812 Obesity, class 2: Secondary | ICD-10-CM

## 2017-10-09 DIAGNOSIS — K2900 Acute gastritis without bleeding: Secondary | ICD-10-CM | POA: Diagnosis not present

## 2017-10-09 DIAGNOSIS — R059 Cough, unspecified: Secondary | ICD-10-CM

## 2017-10-09 DIAGNOSIS — D899 Disorder involving the immune mechanism, unspecified: Secondary | ICD-10-CM | POA: Diagnosis not present

## 2017-10-09 DIAGNOSIS — D849 Immunodeficiency, unspecified: Secondary | ICD-10-CM

## 2017-10-09 LAB — POCT GLYCOSYLATED HEMOGLOBIN (HGB A1C): HEMOGLOBIN A1C: 7.4

## 2017-10-09 MED ORDER — OMEPRAZOLE 40 MG PO CPDR: 40.0000 mg | DELAYED_RELEASE_CAPSULE | Freq: Every day | ORAL | 0 refills | Status: DC

## 2017-10-09 MED ORDER — BENZONATATE 100 MG PO CAPS: 100.0000 mg | ORAL_CAPSULE | Freq: Two times a day (BID) | ORAL | 0 refills | Status: DC | PRN

## 2017-10-09 MED ORDER — LEVOFLOXACIN 500 MG PO TABS: 500.0000 mg | ORAL_TABLET | Freq: Every day | ORAL | 0 refills | Status: DC

## 2017-10-09 MED ORDER — GABAPENTIN 300 MG PO CAPS: 300.0000 mg | ORAL_CAPSULE | Freq: Three times a day (TID) | ORAL | 2 refills | Status: DC

## 2017-10-09 MED ORDER — FLUCONAZOLE 150 MG PO TABS: 150.0000 mg | ORAL_TABLET | ORAL | 0 refills | Status: DC

## 2017-10-09 NOTE — Progress Notes (Signed)
Name: Elizabeth [name]   MRN: [id number]    DOB: 1966-08-16   Date:10/09/2017       Progress Note  Subjective  Chief Complaint  Chief Complaint  Patient presents with  . Abdominal Pain  . Cough  . Diabetes    HPI  DMII: she is on life style modification only, glucose at home is 109-112 fasting. She denies polyphagia, polyuria or polydipsia. HgbA1C is up, 7.4%, she does not want to start medication at this time,she went on steroid therapy for sciatica, we will recheck in 3 months   Hemorrhoids: seen by Dr. Jamal Collin, no need for surgical procedure , she was given topical medication and is doing better, has follow up next week  Cough/chest congestion: she developed cold symptoms initially, about one week ago, however cough has been bothersome, and some SOB with activity. Chest feels congested. She had fever and chills initially but resolved now. She had to take a lot of otc NSAID's and over the past few days she has noticed epigastric pain, lack of appetite and lost a couple of pounds. Cough is wet. Pain on epigastric area is constant 3-4/10 sharp or stabbing. Worse when she eats. Mylanta did not help symptoms. Denies blood in stools. No vomiting.   Obesity: she lost 2 lbs because she has been feeling sick. Discussed life style modification  Vaginitis: she has noticed vaginal itching over the past few days, no vaginal discharge  RA: she is on methotrexate, she has immunosuppression   Patient Active Problem List   Diagnosis Date Noted  . Obesity, Class II, BMI 35-39.9 10/09/2017  . Vaginal atrophy 01/01/2017  . Hyperlipidemia LDL goal <100 10/04/2016  . Medication monitoring encounter 03/16/2016  . Vitamin D deficiency 03/11/2016  . Diverticula of colon 01/24/2016  . Hypertension goal BP (blood pressure) < 130/80 01/17/2016  . Impingement syndrome of right shoulder 01/03/2016  . Bursitis, trochanteric 11/22/2015  . Inversion, nipple 11/08/2015  . Diabetes mellitus type 2, controlled,  without complications ([place]) 24/58/0998  . Rheumatoid arthritis involving both hands ([place]) 10/23/2015  . Benign neoplasm of descending colon   . Benign neoplasm of sigmoid colon   . Bipolar disorder, current episode depressed, moderate ([place]) 07/03/2015  . IBS (irritable bowel syndrome) 07/03/2015  . Insomnia, uncontrolled 07/03/2015  . GERD without esophagitis 07/03/2015  . Obesity (BMI 30-39.9) 07/03/2015  . Bilateral hip bursitis 07/03/2015    Past Surgical History:  Procedure Laterality Date  . BOWEL RESECTION    . CHOLECYSTECTOMY  1997   [place],   . COLONOSCOPY WITH PROPOFOL N/A 09/07/2015   Procedure: COLONOSCOPY WITH PROPOFOL;  Surgeon: Lucilla Lame, MD;  Location: [place];  Service: Endoscopy;  Laterality: N/A;  . ESOPHAGOGASTRODUODENOSCOPY (EGD) WITH ESOPHAGEAL DILATION    . ESOPHAGOGASTRODUODENOSCOPY (EGD) WITH PROPOFOL N/A 09/07/2015   Procedure: ESOPHAGOGASTRODUODENOSCOPY (EGD) WITH PROPOFOL;  Surgeon: Lucilla Lame, MD;  Location: [place];  Service: Endoscopy;  Laterality: N/A;  . PARTIAL COLECTOMY N/A 01/24/2016   Procedure: SIGMOID COLECTOMY;  Surgeon: Florene Glen, MD;  Location: [facility];  Service: General;  Laterality: N/A;  . POLYPECTOMY N/A 09/07/2015   Procedure: POLYPECTOMY;  Surgeon: Lucilla Lame, MD;  Location: [place];  Service: Endoscopy;  Laterality: N/A;  . TUBAL LIGATION      Family History  Problem Relation Age of Onset  . Hypertension Mother   . Diabetes Mother   . Arthritis/Rheumatoid Mother   . Hyperlipidemia Mother   . Alcohol abuse Father   .  Esophageal varices Father   . Diverticulosis Maternal Aunt   . Diverticulosis Maternal Grandmother   . Diabetes Maternal Grandmother   . Heart disease Maternal Grandmother   . Dementia Maternal Grandmother   . Diabetes Brother   . Aneurysm Maternal Grandfather   . Diabetes Paternal Grandmother   . Depression Daughter        bipolar disorder    Social History    Social History  . Marital status: Married    Spouse name: N/A  . Number of children: N/A  . Years of education: N/A   Occupational History  . Not on file.   Social History Main Topics  . Smoking status: Former Smoker    Packs/day: 0.50    Types: Cigarettes    Quit date: 05/06/2015  . Smokeless tobacco: Never Used  . Alcohol use No  . Drug use: No  . Sexual activity: Yes    Birth control/ protection: None   Other Topics Concern  . Not on file   Social History Narrative  . No narrative on file     Current Outpatient Prescriptions:  .  ACCU-CHEK FASTCLIX LANCETS MISC, May check fingerstick blood sugar once a day if desired;  E11.9; bring readings to appt, Disp: 102 each, Rfl: 0 .  aspirin 81 MG tablet, Take 81 mg by mouth daily., Disp: , Rfl:  .  atorvastatin (LIPITOR) 10 MG tablet, take 1 tablet by mouth at bedtime, Disp: 30 tablet, Rfl: 2 .  B-D INS SYR ULTRAFINE 1CC/31G 31G X 5/16" 1 ML MISC, use as directed TO INJECT METHOTREXATE, Disp: , Rfl: 1 .  cyclobenzaprine (FLEXERIL) 5 MG tablet, Take 1 tablet (5 mg total) by mouth at bedtime., Disp: 30 tablet, Rfl: 1 .  ezetimibe (ZETIA) 10 MG tablet, Take 1 tablet (10 mg total) by mouth daily., Disp: 90 tablet, Rfl: 3 .  folic acid (FOLVITE) 1 MG tablet, Take 1 mg by mouth daily., Disp: , Rfl:  .  gabapentin (NEURONTIN) 300 MG capsule, Take 1 capsule (300 mg total) by mouth 3 (three) times daily., Disp: 90 capsule, Rfl: 0 .  glucose blood test strip, Use to check blood sugars twice a day; fluctuating sugars; Dx E11.65; LON 99 months, Disp: 200 each, Rfl: 3 .  hydrocortisone (ANUSOL-HC) 2.5 % rectal cream, Place 1 application rectally 2 (two) times daily., Disp: 30 g, Rfl: 1 .  LINZESS 290 MCG CAPS capsule, take 1 capsule by mouth once daily, Disp: 30 capsule, Rfl: 11 .  methotrexate 50 MG/2ML injection, inject 0.6 milliliter subcutaneously every week, Disp: , Rfl: 0 .  Multiple Vitamins-Minerals (MULTIVITAMIN/EXTRA VITAMIN D3  PO), Take by mouth., Disp: , Rfl:  .  OLANZapine (ZYPREXA) 15 MG tablet, Take 15 mg by mouth at bedtime., Disp: , Rfl: 0 .  traZODone (DESYREL) 150 MG tablet, Take 150 mg by mouth at bedtime., Disp: , Rfl: 0  Allergies  Allergen Reactions  . Ace Inhibitors Swelling    Angioedema (specifically from enalapril)  . Enalapril Swelling    angioedema  . Penicillins Hives and Swelling    Has patient had a PCN reaction causing immediate rash, facial/tongue/throat swelling, SOB or lightheadedness with hypotension: Yes Has patient had a PCN reaction causing severe rash involving mucus membranes or skin necrosis: No Has patient had a PCN reaction that required hospitalization Yes Has patient had a PCN reaction occurring within the last 10 years: No If all of the above answers are "NO", then may proceed with Cephalosporin use.  Marland Kitchen  Lipitor [Atorvastatin] Itching     ROS  Ten systems reviewed and is negative except as mentioned in HPI   Objective  Vitals:   10/09/17 0912  BP: 110/68  Pulse: 86  Resp: 14  SpO2: 98%  Weight: 214 lb 4.8 oz (97.2 kg)  Height: 5\' 7"  (1.702 m)    Body mass index is 33.56 kg/m.  Physical Exam  Constitutional: Patient appears well-developed and well-nourished. Obese No distress.  HEENT: head atraumatic, normocephalic, pupils equal and reactive to light, TM: normal  neck supple, throat within normal limits Cardiovascular: Normal rate, regular rhythm and normal heart sounds.  No murmur heard. No BLE edema. Pulmonary/Chest: Effort normal she has end expiratory wheezing and fine crackles on right mid to lower lung.  No respiratory distress. Abdominal: Soft.  There is no tenderness. Psychiatric: Patient has a normal mood and affect. behavior is normal. Judgment and thought content normal.  PHQ2/9: Depression screen [facility] 2/9 07/02/2017 04/01/2017 01/01/2017 09/09/2016 06/10/2016  Decreased Interest 0 0 0 0 0  Down, Depressed, Hopeless 0 0 0 0 3  PHQ - 2 Score 0 0 0 0  3  Altered sleeping - - - - 3  Tired, decreased energy - - - - 1  Change in appetite - - - - 0  Feeling bad or failure about yourself  - - - - 0  Trouble concentrating - - - - 2  Moving slowly or fidgety/restless - - - - 0  Suicidal thoughts - - - - 0  PHQ-9 Score - - - - 9     Fall Risk: Fall Risk  10/09/2017 07/02/2017 04/01/2017 01/01/2017 09/09/2016  Falls in the past year? No No No No No     Functional Status Survey: Is the patient deaf or have difficulty hearing?: No Does the patient have difficulty seeing, even when wearing glasses/contacts?: No Does the patient have difficulty concentrating, remembering, or making decisions?: No Does the patient have difficulty walking or climbing stairs?: No Does the patient have difficulty dressing or bathing?: No Does the patient have difficulty doing errands alone such as visiting a doctor's office or shopping?: No    Assessment & Plan  1. Controlled type 2 diabetes mellitus without complication, without long-term current use of insulin (HCC)  - POCT HgB A1C  2. Obesity, Class II, BMI 35-39.9  Discussed with the patient the risk posed by an increased BMI. Discussed importance of portion control, calorie counting and at least 150 minutes of physical activity weekly. Avoid sweet beverages and drink more water. Eat at least 6 servings of fruit and vegetables daily   3. Chronic midline low back pain with sciatica, sciatica laterality unspecified  - gabapentin (NEURONTIN) 300 MG capsule; Take 1 capsule (300 mg total) by mouth 3 (three) times daily.  Dispense: 90 capsule; Refill: 2  4. Cough  -CXR - levofloxacin (LEVAQUIN) 500 MG tablet; Take 1 tablet (500 mg total) by mouth daily.  Dispense: 7 tablet; Refill: 0 - benzonatate (TESSALON) 100 MG capsule; Take 1-2 capsules (100-200 mg total) by mouth 2 (two) times daily as needed.  Dispense: 40 capsule; Refill: 0  5. Acute superficial gastritis without hemorrhage  Omeprazole started,  take for two weeks, discussed risk of decreasing methotrexate elimination, she will wean self off after 2 weeks  6. Immunosuppressed status ([place])  - levofloxacin (LEVAQUIN) 500 MG tablet; Take 1 tablet (500 mg total) by mouth daily.  Dispense: 7 tablet; Refill: 0  7. Acute vaginitis  - fluconazole (  DIFLUCAN) 150 MG tablet; Take 1 tablet (150 mg total) by mouth every other day.  Dispense: 3 tablet; Refill: 0  8. Abnormal lung sounds  - levofloxacin (LEVAQUIN) 500 MG tablet; Take 1 tablet (500 mg total) by mouth daily.  Dispense: 7 tablet; Refill: 0 - benzonatate (TESSALON) 100 MG capsule; Take 1-2 capsules (100-200 mg total) by mouth 2 (two) times daily as needed.  Dispense: 40 capsule; Refill: 0

## 2017-10-13 ENCOUNTER — Encounter: Payer: Self-pay | Admitting: General Surgery

## 2017-10-13 ENCOUNTER — Ambulatory Visit (INDEPENDENT_AMBULATORY_CARE_PROVIDER_SITE_OTHER): Payer: Medicare Other | Admitting: General Surgery

## 2017-10-13 VITALS — BP 130/70 | HR 78 | Resp 13 | Ht 67.0 in | Wt 216.0 lb

## 2017-10-13 DIAGNOSIS — K644 Residual hemorrhoidal skin tags: Secondary | ICD-10-CM | POA: Diagnosis not present

## 2017-10-13 NOTE — Progress Notes (Signed)
Patient ID: Elizabeth [name], female   DOB: [birth year], 51 y.o.   MRN: [id number]  Chief Complaint  Patient presents with  . Follow-up    hemorrhoids    HPI Elizabeth [name] is a 51 y.o. female here following up for hemorrhoids. She reports that after a week and a half the symptoms have cleared up fully. She reports no further swelling or pain.                                               HPI  Past Medical History:  Diagnosis Date  . Bipolar 1 disorder ([place])   . Depression   . Diabetes mellitus without complication ([place])   . Diverticulosis   . GERD (gastroesophageal reflux disease)   . Heart murmur    followed by PCP  . Hepatitis    B - "not active"  . Hypertension goal BP (blood pressure) < 130/80 01/17/2016  . Insomnia   . Irritable bowel syndrome   . Motion sickness    boats  . Obesity (BMI 30-39.9) 07/03/2015  . RA (rheumatoid arthritis) ([place])   . Sciatica     Past Surgical History:  Procedure Laterality Date  . BOWEL RESECTION    . CHOLECYSTECTOMY  1997   [place], [place]  . ESOPHAGOGASTRODUODENOSCOPY (EGD) WITH ESOPHAGEAL DILATION    . TUBAL LIGATION      Family History  Problem Relation Age of Onset  . Hypertension Mother   . Diabetes Mother   . Arthritis/Rheumatoid Mother   . Hyperlipidemia Mother   . Alcohol abuse Father   . Esophageal varices Father   . Diverticulosis Maternal Aunt   . Diverticulosis Maternal Grandmother   . Diabetes Maternal Grandmother   . Heart disease Maternal Grandmother   . Dementia Maternal Grandmother   . Diabetes Brother   . Aneurysm Maternal Grandfather   . Diabetes Paternal Grandmother   . Depression Daughter        bipolar disorder    Social History Social History   Tobacco Use  . Smoking status: Former Smoker    Packs/day: 0.50    Types: Cigarettes    Last attempt to quit: 05/06/2015    Years since quitting: 2.4  . Smokeless tobacco: Never Used  Substance Use Topics  . Alcohol use: No    Alcohol/week: 0.0 oz  . Drug  use: No    Allergies  Allergen Reactions  . Ace Inhibitors Swelling    Angioedema (specifically from enalapril)  . Enalapril Swelling    angioedema  . Penicillins Hives and Swelling    Has patient had a PCN reaction causing immediate rash, facial/tongue/throat swelling, SOB or lightheadedness with hypotension: Yes Has patient had a PCN reaction causing severe rash involving mucus membranes or skin necrosis: No Has patient had a PCN reaction that required hospitalization Yes Has patient had a PCN reaction occurring within the last 10 years: No If all of the above answers are "NO", then may proceed with Cephalosporin use.    Current Outpatient Medications  Medication Sig Dispense Refill  . ACCU-CHEK FASTCLIX LANCETS MISC May check fingerstick blood sugar once a day if desired;  E11.9; bring readings to appt 102 each 0  . aspirin 81 MG tablet Take 81 mg by mouth daily.    Marland Kitchen atorvastatin (LIPITOR) 10 MG tablet take 1 tablet by mouth at  bedtime 30 tablet 2  . B-D INS SYR ULTRAFINE 1CC/31G 31G X 5/16" 1 ML MISC use as directed TO INJECT METHOTREXATE  1  . benzonatate (TESSALON) 100 MG capsule Take 1-2 capsules (100-200 mg total) by mouth 2 (two) times daily as needed. 40 capsule 0  . cyclobenzaprine (FLEXERIL) 5 MG tablet Take 1 tablet (5 mg total) by mouth at bedtime. 30 tablet 1  . ezetimibe (ZETIA) 10 MG tablet Take 1 tablet (10 mg total) by mouth daily. 90 tablet 3  . fluconazole (DIFLUCAN) 150 MG tablet Take 1 tablet (150 mg total) by mouth every other day. 3 tablet 0  . folic acid (FOLVITE) 1 MG tablet Take 1 mg by mouth daily.    Marland Kitchen gabapentin (NEURONTIN) 300 MG capsule Take 1 capsule (300 mg total) by mouth 3 (three) times daily. 90 capsule 2  . glucose blood test strip Use to check blood sugars twice a day; fluctuating sugars; Dx E11.65; LON 99 months 200 each 3  . hydrocortisone (ANUSOL-HC) 2.5 % rectal cream Place 1 application rectally 2 (two) times daily. 30 g 1  . levofloxacin  (LEVAQUIN) 500 MG tablet Take 1 tablet (500 mg total) by mouth daily. 7 tablet 0  . LINZESS 290 MCG CAPS capsule take 1 capsule by mouth once daily 30 capsule 11  . methotrexate 50 MG/2ML injection inject 0.6 milliliter subcutaneously every week  0  . Multiple Vitamins-Minerals (MULTIVITAMIN/EXTRA VITAMIN D3 PO) Take by mouth.    . OLANZapine (ZYPREXA) 15 MG tablet Take 15 mg by mouth at bedtime.  0  . omeprazole (PRILOSEC) 40 MG capsule Take 1 capsule (40 mg total) by mouth daily. 30 capsule 0  . traZODone (DESYREL) 150 MG tablet Take 150 mg by mouth at bedtime.  0   No current facility-administered medications for this visit.     Review of Systems Review of Systems  Constitutional: Negative.   Respiratory: Negative.   Cardiovascular: Negative.     Blood pressure 130/70, pulse 78, resp. rate 13, height 5\' 7"  (1.702 m), weight 216 lb (98 kg), last menstrual period 07/09/2014.  Physical Exam Physical Exam  Genitourinary: Rectal exam shows external hemorrhoid (markedly improved).  Genitourinary Comments: Tiny clot noted on exam.      Data Reviewed Prior note  Assessment    Markedly improved external hemorrhoid. Small residual clot should resorb soon    Plan    Follow up as needed    HPI, Physical Exam, Assessment and Plan have been scribed under the direction and in the presence of Mckinley Jewel, MD  Concepcion Living, LPN I have completed the exam and reviewed the above documentation for accuracy and completeness.  I agree with the above.  Haematologist has been used and any errors in dictation or transcription are unintentional.  Maurilio Puryear G. Jamal Collin, M.D., F.A.C.S.   Junie Panning G 10/13/2017, 2:34 PM

## 2017-10-13 NOTE — Patient Instructions (Signed)
The tiny clot should go away. Continue to use the cream for another week. Call with any problems

## 2017-10-15 ENCOUNTER — Encounter: Payer: Self-pay | Admitting: Family Medicine

## 2017-10-15 ENCOUNTER — Ambulatory Visit (INDEPENDENT_AMBULATORY_CARE_PROVIDER_SITE_OTHER): Payer: Medicare Other | Admitting: Family Medicine

## 2017-10-15 VITALS — BP 106/60 | HR 98 | Temp 98.2°F | Resp 16 | Wt 215.4 lb

## 2017-10-15 DIAGNOSIS — Z92241 Personal history of systemic steroid therapy: Secondary | ICD-10-CM | POA: Diagnosis not present

## 2017-10-15 DIAGNOSIS — J189 Pneumonia, unspecified organism: Secondary | ICD-10-CM | POA: Diagnosis not present

## 2017-10-15 DIAGNOSIS — M05742 Rheumatoid arthritis with rheumatoid factor of left hand without organ or systems involvement: Secondary | ICD-10-CM | POA: Diagnosis not present

## 2017-10-15 DIAGNOSIS — M05741 Rheumatoid arthritis with rheumatoid factor of right hand without organ or systems involvement: Secondary | ICD-10-CM

## 2017-10-15 DIAGNOSIS — E119 Type 2 diabetes mellitus without complications: Secondary | ICD-10-CM

## 2017-10-15 NOTE — Patient Instructions (Addendum)
Please call with any issues I recommend vitamin C tablets Green tea is recommended for your immune system

## 2017-10-15 NOTE — Assessment & Plan Note (Signed)
Expect that caused the increase in her glucose; short-term, so should resolve; explained topical steroids for hemorrhoids would have much less of an effect on her sugars than the oral course

## 2017-10-15 NOTE — Assessment & Plan Note (Signed)
Foot exam by MD today; glad sugars are coming back down after finishing steroids

## 2017-10-15 NOTE — Progress Notes (Signed)
BP 106/60   Pulse 98   Temp 98.2 F (36.8 C) (Oral)   Resp 16   Wt 215 lb 6.4 oz (97.7 kg)   LMP 07/09/2014   SpO2 98%   BMI 33.74 kg/m    Subjective:    Patient ID: Elizabeth [name], female    DOB: Jul 13, 1966, 51 y.o.   MRN: [id number]  HPI: Elizabeth [name] is a 51 y.o. female  Chief Complaint  Patient presents with  . Follow-up    pneumonia    HPI Patient is here for f/u of "pneumonia" She saw my colleague here on 10/09/17 She was on steroids and MTX and immune system worn down; husband had just a cold and hers settled into walking pneumonia Saw Dr. Ancil Boozer twice; popped out with hemorrhoids, blew out, cutting down tree and popped with straining; used steroids topically for that; asked if that would affect her sugars (type 2 DM) Then she had the cough; heard wheezing, bringing up a little bit of sputum; some clear and some yellow Diagnosed with walking pneumonia; treated with levaquin, took last dose today No fevers No travel No weird rash Sinus drainage and congestion, then it really runs She was on steroids twice and it ran her FSBS up to 300; better now; last a1c was 7.4 Has not had pneumonia vaccine; also on MTX and has not had hep A vaccine Going out of town tomorrow Blood pressure is excellent; she is very pleased with her reading today  Chest xray 10/09/17 COMPARISON:  None.  FINDINGS: The heart size and mediastinal contours are within normal limits. Both lungs are clear. The visualized skeletal structures are unremarkable.  IMPRESSION: No active cardiopulmonary disease.   Electronically Signed   By: Inez Catalina M.D.   On: 10/09/2017 12:05   Depression screen [facility] 2/9 10/15/2017 07/02/2017 04/01/2017 01/01/2017 09/09/2016  Decreased Interest 0 0 0 0 0  Down, Depressed, Hopeless 0 0 0 0 0  PHQ - 2 Score 0 0 0 0 0  Altered sleeping - - - - -  Tired, decreased energy - - - - -  Change in appetite - - - - -  Feeling bad or failure about yourself  - - -  - -  Trouble concentrating - - - - -  Moving slowly or fidgety/restless - - - - -  Suicidal thoughts - - - - -  PHQ-9 Score - - - - -    Relevant past medical, surgical, family and social history reviewed Past Medical History:  Diagnosis Date  . Bipolar 1 disorder ([place])   . Depression   . Diabetes mellitus without complication ([place])   . Diverticulosis   . GERD (gastroesophageal reflux disease)   . Heart murmur    followed by PCP  . Hepatitis    B - "not active"  . Hypertension goal BP (blood pressure) < 130/80 01/17/2016  . Insomnia   . Irritable bowel syndrome   . Motion sickness    boats  . Obesity (BMI 30-39.9) 07/03/2015  . RA (rheumatoid arthritis) ([place])   . Sciatica    Past Surgical History:  Procedure Laterality Date  . BOWEL RESECTION    . CHOLECYSTECTOMY  1997   [place]  . ESOPHAGOGASTRODUODENOSCOPY (EGD) WITH ESOPHAGEAL DILATION    . TUBAL LIGATION     Family History  Problem Relation Age of Onset  . Hypertension Mother   . Diabetes Mother   . Arthritis/Rheumatoid Mother   . Hyperlipidemia  Mother   . Alcohol abuse Father   . Esophageal varices Father   . Diverticulosis Maternal Aunt   . Diverticulosis Maternal Grandmother   . Diabetes Maternal Grandmother   . Heart disease Maternal Grandmother   . Dementia Maternal Grandmother   . Diabetes Brother   . Aneurysm Maternal Grandfather   . Diabetes Paternal Grandmother   . Depression Daughter        bipolar disorder   Social History   Socioeconomic History  . Marital status: Married    Spouse name: Not on file  . Number of children: Not on file  . Years of education: Not on file  . Highest education level: Not on file  Social Needs  . Financial resource strain: Not on file  . Food insecurity - worry: Not on file  . Food insecurity - inability: Not on file  . Transportation needs - medical: Not on file  . Transportation needs - non-medical: Not on file  Occupational History  . Not on file    Tobacco Use  . Smoking status: Former Smoker    Packs/day: 0.50    Types: Cigarettes    Last attempt to quit: 05/06/2015    Years since quitting: 2.4  . Smokeless tobacco: Never Used  Substance and Sexual Activity  . Alcohol use: No    Alcohol/week: 0.0 oz  . Drug use: No  . Sexual activity: Yes    Birth control/protection: None  Other Topics Concern  . Not on file  Social History Narrative  . Not on file    Interim medical history since last visit reviewed. Allergies and medications reviewed  Review of Systems Per HPI unless specifically indicated above     Objective:    BP 106/60   Pulse 98   Temp 98.2 F (36.8 C) (Oral)   Resp 16   Wt 215 lb 6.4 oz (97.7 kg)   LMP 07/09/2014   SpO2 98%   BMI 33.74 kg/m   Wt Readings from Last 3 Encounters:  10/15/17 215 lb 6.4 oz (97.7 kg)  10/13/17 216 lb (98 kg)  10/09/17 214 lb 4.8 oz (97.2 kg)    Physical Exam  Constitutional: She appears well-developed and well-nourished.  HENT:  Right Ear: Tympanic membrane and ear canal normal.  Left Ear: Tympanic membrane and ear canal normal.  Nose: Rhinorrhea (clear) present.  Mouth/Throat: Oropharynx is clear and moist and mucous membranes are normal. No posterior oropharyngeal edema or posterior oropharyngeal erythema.  Eyes: EOM are normal. No scleral icterus.  Cardiovascular: Normal rate and regular rhythm.  Pulmonary/Chest: Effort normal and breath sounds normal. No accessory muscle usage. No respiratory distress. She has no decreased breath sounds. She has no wheezes. She has no rhonchi.  Lymphadenopathy:    She has no cervical adenopathy.       Right: No supraclavicular adenopathy present.       Left: No supraclavicular adenopathy present.  Psychiatric: She has a normal mood and affect. Her behavior is normal.   Diabetic Foot Form - Detailed   Diabetic Foot Exam - detailed Diabetic Foot exam was performed with the following findings:  Yes 10/15/2017  9:54 AM  Visual  Foot Exam completed.:  Yes  Pulse Foot Exam completed.:  Yes  Right Dorsalis Pedis:  Present Left Dorsalis Pedis:  Present  Sensory Foot Exam Completed.:  Yes Semmes-Weinstein Monofilament Test R Site 1-Great Toe:  Pos L Site 1-Great Toe:  Pos        Results  for orders placed or performed in visit on 10/09/17  POCT HgB A1C  Result Value Ref Range   Hemoglobin A1C 7.4       Assessment & Plan:   Problem List Items Addressed This Visit      Endocrine   Diabetes mellitus type 2, controlled, without complications ([place]) (Chronic)    Foot exam by MD today; glad sugars are coming back down after finishing steroids        Musculoskeletal and Integument   Rheumatoid arthritis involving both hands ([facility]) (Chronic)    On MTX; will bring her back in 4 weeks for first of two hepatitis A vaccines (6 month interval between the two recommended)        Other   History of recent steroid use    Expect that caused the increase in her glucose; short-term, so should resolve; explained topical steroids for hemorrhoids would have much less of an effect on her sugars than the oral course       Other Visit Diagnoses    Walking pneumonia    -  Primary   resolved; finished last dose of ABX this morning; should be safe to travel; will bring her back in four weeks for PPSV-23 vaccine       Follow up plan: Return in about 4 weeks (around 11/12/2017) for CMA visit for hepatitis A vaccine and pneumonia vaccine (PPSV-23).  An after-visit summary was printed and given to the patient at [place] [facility].  Please see the patient instructions which may contain other information and recommendations beyond what is mentioned above in the assessment and plan.  Meds ordered this encounter  Medications  . Probiotic Product (PRO-BIOTIC BLEND PO)    Sig: Take 1 tablet daily by mouth.    No orders of the defined types were placed in this encounter.

## 2017-10-15 NOTE — Assessment & Plan Note (Signed)
On MTX; will bring her back in 4 weeks for first of two hepatitis A vaccines (6 month interval between the two recommended)

## 2017-11-03 DIAGNOSIS — Z79899 Other long term (current) drug therapy: Secondary | ICD-10-CM | POA: Diagnosis not present

## 2017-11-03 DIAGNOSIS — M0609 Rheumatoid arthritis without rheumatoid factor, multiple sites: Secondary | ICD-10-CM | POA: Diagnosis not present

## 2017-11-04 ENCOUNTER — Other Ambulatory Visit: Payer: Self-pay | Admitting: Family Medicine

## 2017-11-04 DIAGNOSIS — K2900 Acute gastritis without bleeding: Secondary | ICD-10-CM

## 2017-11-04 NOTE — Telephone Encounter (Signed)
Last cmp reviewed; last LDL less than 70; Rx approved

## 2017-11-12 ENCOUNTER — Other Ambulatory Visit: Payer: Self-pay | Admitting: Family Medicine

## 2017-11-12 ENCOUNTER — Ambulatory Visit (INDEPENDENT_AMBULATORY_CARE_PROVIDER_SITE_OTHER): Payer: Medicare Other

## 2017-11-12 DIAGNOSIS — Z1231 Encounter for screening mammogram for malignant neoplasm of breast: Secondary | ICD-10-CM

## 2017-11-12 DIAGNOSIS — Z23 Encounter for immunization: Secondary | ICD-10-CM

## 2017-11-12 NOTE — Patient Instructions (Signed)
Hepatitis A Vaccine, Inactivated suspension for injection What is this medicine? HEPATITIS A VACCINE (hep uh TAHY tis A VAK seen) is a vaccine to protect from an infection with the hepatitis A virus. This vaccine does not contain the live virus. It will not cause a hepatitis infection. This vaccine is also used with immunoglobulin to prevent infection in people who have been exposed to hepatitis A. This medicine may be used for other purposes; ask your health care provider or pharmacist if you have questions. COMMON BRAND NAME(S): Havrix, Vaqta What should I tell my health care provider before I take this medicine? They need to know if you have any of these conditions: -bleeding disorder -fever or infection -heart disease -immune system problems -an unusual or allergic reaction to hepatitis A vaccine, latex, neomycin, other medicines, foods, dyes, or preservatives -pregnant or trying to get pregnant -breast-feeding How should I use this medicine? This vaccine is for injection into a muscle. It is given by a health care professional. A copy of Vaccine Information Statements will be given before each vaccination. Read this sheet carefully each time. The sheet may change frequently. Talk to your pediatrician regarding the use of this medicine in children. While this drug may be prescribed for children as young as 52 months of age for selected conditions, precautions do apply. Overdosage: If you think you have taken too much of this medicine contact a poison control center or emergency room at once. NOTE: This medicine is only for you. Do not share this medicine with others. What if I miss a dose? This does not apply. What may interact with this medicine? -medicines to treat cancer -medicines that suppress your immune function like adalimumab, anakinra, etanercept, infliximab -steroid medicines like prednisone or cortisone This list may not describe all possible interactions. Give your health  care provider a list of all the medicines, herbs, non-prescription drugs, or dietary supplements you use. Also tell them if you smoke, drink alcohol, or use illegal drugs. Some items may interact with your medicine. What should I watch for while using this medicine? See your health care provider for a booster shot of this vaccine as directed. Tell your doctor right away if you have any serious or unusual side effects after getting this vaccine. You will not have protection from the hepatitis A virus for at least 8 to 10 days after your first injection. The length of time you will have protection from hepatitis A virus infection is not known. Check with your doctor if you have questions about your immunity. See your doctor before you travel out of the country. What side effects may I notice from receiving this medicine? Side effects that you should report to your doctor or health care professional as soon as possible: -allergic reactions like skin rash, itching or hives, swelling of the face, lips, or tongue -breathing problems -seizures -yellowing of the eyes or skin Side effects that usually do not require medical attention (report to your doctor or health care professional if they continue or are bothersome): -diarrhea -fever -loss of appetite -muscle pain -nausea -pain, redness, swelling or irritation at site where injected -tiredness This list may not describe all possible side effects. Call your doctor for medical advice about side effects. You may report side effects to FDA at 1-800-FDA-1088. Where should I keep my medicine? This drug is given in a hospital or clinic and will not be stored at home. NOTE: This sheet is a summary. It may not cover all possible  information. If you have questions about this medicine, talk to your doctor, pharmacist, or health care provider.  2018 Elsevier/Gold Standard (2014-03-28 13:19:40)

## 2017-11-21 DIAGNOSIS — F250 Schizoaffective disorder, bipolar type: Secondary | ICD-10-CM | POA: Diagnosis not present

## 2017-12-04 DIAGNOSIS — M0609 Rheumatoid arthritis without rheumatoid factor, multiple sites: Secondary | ICD-10-CM | POA: Diagnosis not present

## 2017-12-04 DIAGNOSIS — Z79899 Other long term (current) drug therapy: Secondary | ICD-10-CM | POA: Diagnosis not present

## 2017-12-04 DIAGNOSIS — M7542 Impingement syndrome of left shoulder: Secondary | ICD-10-CM | POA: Diagnosis not present

## 2017-12-04 DIAGNOSIS — M79672 Pain in left foot: Secondary | ICD-10-CM | POA: Diagnosis not present

## 2017-12-04 DIAGNOSIS — M7541 Impingement syndrome of right shoulder: Secondary | ICD-10-CM | POA: Diagnosis not present

## 2017-12-05 ENCOUNTER — Ambulatory Visit
Admission: RE | Admit: 2017-12-05 | Discharge: 2017-12-05 | Disposition: A | Discharge status: Home / Self Care | Payer: Medicare Other | Source: Ambulatory Visit | Attending: Family Medicine | Admitting: Family Medicine

## 2017-12-05 ENCOUNTER — Emergency Department
Admission: EM | Admit: 2017-12-05 | Discharge: 2017-12-05 | Disposition: A | Discharge status: Home / Self Care | Payer: Medicare Other | Attending: Emergency Medicine | Admitting: Emergency Medicine

## 2017-12-05 DIAGNOSIS — R51 Headache: Secondary | ICD-10-CM | POA: Diagnosis not present

## 2017-12-05 DIAGNOSIS — R739 Hyperglycemia, unspecified: Secondary | ICD-10-CM

## 2017-12-05 DIAGNOSIS — M069 Rheumatoid arthritis, unspecified: Secondary | ICD-10-CM | POA: Insufficient documentation

## 2017-12-05 DIAGNOSIS — Z880 Allergy status to penicillin: Secondary | ICD-10-CM | POA: Diagnosis not present

## 2017-12-05 DIAGNOSIS — E1165 Type 2 diabetes mellitus with hyperglycemia: Secondary | ICD-10-CM | POA: Diagnosis not present

## 2017-12-05 DIAGNOSIS — Z1231 Encounter for screening mammogram for malignant neoplasm of breast: Secondary | ICD-10-CM | POA: Insufficient documentation

## 2017-12-05 DIAGNOSIS — Z87891 Personal history of nicotine dependence: Secondary | ICD-10-CM | POA: Insufficient documentation

## 2017-12-05 LAB — CBC
HEMATOCRIT: 37.9 % (ref 35.0–47.0)
HEMOGLOBIN: 12.9 g/dL (ref 12.0–16.0)
MCH: 31.8 pg (ref 26.0–34.0)
MCHC: 34.1 g/dL (ref 32.0–36.0)
MCV: 93.3 fL (ref 80.0–100.0)
Platelets: 256 10*3/uL (ref 150–440)
RBC: 4.07 MIL/uL (ref 3.80–5.20)
RDW: 14 % (ref 11.5–14.5)
WBC: 8 10*3/uL (ref 3.6–11.0)

## 2017-12-05 LAB — BASIC METABOLIC PANEL
ANION GAP: 6 (ref 5–15)
BUN: 18 mg/dL (ref 6–20)
CALCIUM: 9.3 mg/dL (ref 8.9–10.3)
CO2: 26 mmol/L (ref 22–32)
Chloride: 105 mmol/L (ref 101–111)
Creatinine, Ser: 0.81 mg/dL (ref 0.44–1.00)
GFR calc Af Amer: >60 mL/min (ref >60)
GLUCOSE: 248 mg/dL — AB (ref 65–99)
Potassium: 4.2 mmol/L (ref 3.5–5.1)
SODIUM: 137 mmol/L (ref 135–145)

## 2017-12-05 LAB — URINALYSIS, COMPLETE (UACMP) WITH MICROSCOPIC
BACTERIA UA: NONE SEEN
Bilirubin Urine: NEGATIVE
Glucose, UA: 150 mg/dL — AB
Hgb urine dipstick: NEGATIVE
Ketones, ur: NEGATIVE mg/dL
Leukocytes, UA: NEGATIVE
Nitrite: NEGATIVE
PH: 6 (ref 5.0–8.0)
Protein, ur: NEGATIVE mg/dL
SPECIFIC GRAVITY, URINE: 1.005 (ref 1.005–1.030)

## 2017-12-05 LAB — GLUCOSE, CAPILLARY: GLUCOSE-CAPILLARY: 249 mg/dL — AB (ref 65–99)

## 2017-12-05 LAB — POCT PREGNANCY, URINE: Preg Test, Ur: NEGATIVE

## 2017-12-05 MED ORDER — SODIUM CHLORIDE 0.9 % IV BOLUS (SEPSIS)
1000.0000 mL | Freq: Once | INTRAVENOUS | Status: AC
  Administered 2017-12-05: 1000 mL via INTRAVENOUS

## 2017-12-05 MED ORDER — OXYCODONE-ACETAMINOPHEN 5-325 MG PO TABS: 1.0000 | ORAL_TABLET | Freq: Three times a day (TID) | ORAL | 0 refills | Status: DC | PRN

## 2017-12-05 NOTE — ED Notes (Signed)
Pt arrived to front desk in no acute distress. Pt states her blood sugar is over 250.

## 2017-12-05 NOTE — ED Triage Notes (Signed)
Patient c/o hyperglycemia at home. patient reports CBG at home of 257 (patient normally at 108). Patient c/o headache, denies any other discomfort.

## 2017-12-05 NOTE — ED Provider Notes (Signed)
[facility] Emergency Department Provider Note       Time seen: ----------------------------------------- 7:57 PM on 12/05/2017 -----------------------------------------   I have reviewed the triage vital signs and the nursing notes.  HISTORY   Chief Complaint Hyperglycemia    HPI Elizabeth [name] is a 51 y.o. female with a history of bipolar disorder, depression, diabetes that is diet controlled and GERD who presents to the ED for hyperglycemia at home.  Patient reports blood sugar around 257.  She complains of some headache but denies any other discomfort.  Currently she is taking steroids for her left shoulder.  She does not normally take medicine for diabetes.  Pain is 7 out of 10 in her head.  Past Medical History:  Diagnosis Date  . Bipolar 1 disorder ([place])   . Depression   . Diabetes mellitus without complication ([place])   . Diverticulosis   . GERD (gastroesophageal reflux disease)   . Heart murmur    followed by PCP  . Hepatitis    B - "not active"  . Hypertension goal BP (blood pressure) < 130/80 01/17/2016  . Insomnia   . Irritable bowel syndrome   . Motion sickness    boats  . Obesity (BMI 30-39.9) 07/03/2015  . RA (rheumatoid arthritis) ([place])   . Sciatica     Patient Active Problem List   Diagnosis Date Noted  . History of recent steroid use 10/15/2017  . Obesity, Class II, BMI 35-39.9 10/09/2017  . Vaginal atrophy 01/01/2017  . Hyperlipidemia LDL goal <100 10/04/2016  . Medication monitoring encounter 03/16/2016  . Vitamin D deficiency 03/11/2016  . Diverticula of colon 01/24/2016  . Hypertension goal BP (blood pressure) < 130/80 01/17/2016  . Impingement syndrome of right shoulder 01/03/2016  . Bursitis, trochanteric 11/22/2015  . Inversion, nipple 11/08/2015  . Diabetes mellitus type 2, controlled, without complications ([place]) 74/07/1447  . Rheumatoid arthritis involving both hands ([place]) 10/23/2015  . Benign neoplasm of  descending colon   . Benign neoplasm of sigmoid colon   . Bipolar disorder, current episode depressed, moderate ([place]) 07/03/2015  . IBS (irritable bowel syndrome) 07/03/2015  . Insomnia, uncontrolled 07/03/2015  . GERD without esophagitis 07/03/2015  . Obesity (BMI 30-39.9) 07/03/2015  . Bilateral hip bursitis 07/03/2015    Past Surgical History:  Procedure Laterality Date  . BOWEL RESECTION    . CHOLECYSTECTOMY  1997   [place], [place]  . COLONOSCOPY WITH PROPOFOL N/A 09/07/2015   Procedure: COLONOSCOPY WITH PROPOFOL;  Surgeon: Lucilla Lame, MD;  Location: [place];  Service: Endoscopy;  Laterality: N/A;  . ESOPHAGOGASTRODUODENOSCOPY (EGD) WITH ESOPHAGEAL DILATION    . ESOPHAGOGASTRODUODENOSCOPY (EGD) WITH PROPOFOL N/A 09/07/2015   Procedure: ESOPHAGOGASTRODUODENOSCOPY (EGD) WITH PROPOFOL;  Surgeon: Lucilla Lame, MD;  Location: [place];  Service: Endoscopy;  Laterality: N/A;  . PARTIAL COLECTOMY N/A 01/24/2016   Procedure: SIGMOID COLECTOMY;  Surgeon: Florene Glen, MD;  Location: [facility];  Service: General;  Laterality: N/A;  . POLYPECTOMY N/A 09/07/2015   Procedure: POLYPECTOMY;  Surgeon: Lucilla Lame, MD;  Location: [place];  Service: Endoscopy;  Laterality: N/A;  . TUBAL LIGATION      Allergies Ace inhibitors; Enalapril; Penicillins; and Statins  Social History Social History   Tobacco Use  . Smoking status: Former Smoker    Packs/day: 0.50    Types: Cigarettes    Last attempt to quit: 05/06/2015    Years since quitting: 2.5  . Smokeless tobacco: Never Used  Substance Use  Topics  . Alcohol use: No    Alcohol/week: 0.0 oz  . Drug use: No    Review of Systems Constitutional: Negative for fever. Cardiovascular: Negative for chest pain. Respiratory: Negative for shortness of breath. Gastrointestinal: Negative for abdominal pain, vomiting and diarrhea. Genitourinary: Negative for dysuria. Musculoskeletal: Negative for back pain. Skin:  Negative for rash. Neurological: Positive for headache  All systems negative/normal/unremarkable except as stated in the HPI  ____________________________________________   PHYSICAL EXAM:  VITAL SIGNS: ED Triage Vitals [12/05/17 1916]  Enc Vitals Group     BP      Pulse      Resp      Temp      Temp src      SpO2      Weight 215 lb (97.5 kg)     Height      Head Circumference      Peak Flow      Pain Score 7     Pain Loc      Pain Edu?      Excl. in [place]?     Constitutional: Alert and oriented. Well appearing and in no distress. Eyes: Conjunctivae are normal. Normal extraocular movements. ENT   Head: Normocephalic and atraumatic.   Nose: No congestion/rhinnorhea.   Mouth/Throat: Mucous membranes are moist.   Neck: No stridor. Cardiovascular: Normal rate, regular rhythm. No murmurs, rubs, or gallops. Respiratory: Normal respiratory effort without tachypnea nor retractions. Breath sounds are clear and equal bilaterally. No wheezes/rales/rhonchi. Gastrointestinal: Soft and nontender. Normal bowel sounds Musculoskeletal: Nontender with normal range of motion in extremities. No lower extremity tenderness nor edema. Neurologic:  Normal speech and language. No gross focal neurologic deficits are appreciated.  Skin:  Skin is warm, dry and intact. No rash noted. Psychiatric: Mood and affect are normal. Speech and behavior are normal.  ____________________________________________  ED COURSE:  As part of my medical decision making, I reviewed the following data within the [place] History obtained from family if available, nursing notes, old chart and ekg, as well as notes from prior ED visits. Patient presented for hyperglycemia, we will assess with labs and imaging as indicated at this time.   Procedures ____________________________________________   LABS (pertinent positives/negatives)  Labs Reviewed  BASIC METABOLIC PANEL - Abnormal;  Notable for the following components:      Result Value   Glucose, Bld 248 (*)    All other components within normal limits  GLUCOSE, CAPILLARY - Abnormal; Notable for the following components:   Glucose-Capillary 249 (*)    All other components within normal limits  CBC  URINALYSIS, COMPLETE (UACMP) WITH MICROSCOPIC  CBG MONITORING, ED  POC URINE PREG, ED  POCT PREGNANCY, URINE  ____________________________________________  DIFFERENTIAL DIAGNOSIS   Hyperglycemia, dehydration, electrolyte abnormality, DKA, HH NK  FINAL ASSESSMENT AND PLAN  Hyperglycemia   Plan: Patient had presented for hyperglycemia likely related to recent steroid intake. Patient's labs are reassuring other than a mildly elevated glucose level.  I will have her go ahead and stop taking her Medrol Dosepak.  I will prescribe pain medicine for her RA.   Earleen Newport, MD   Note: This note was generated in part or whole with voice recognition software. Voice recognition is usually quite accurate but there are transcription errors that can and very often do occur. I apologize for any typographical errors that were not detected and corrected.     Earleen [place], MD 12/05/17 2026

## 2017-12-05 NOTE — ED Notes (Signed)
Pt ambulatory upon discharge. Verbalized understanding of discharge instructions, prescription and follow-up care. VSS. Skin warm and dry. A&O x4.

## 2017-12-08 ENCOUNTER — Other Ambulatory Visit: Payer: Self-pay | Admitting: Family Medicine

## 2017-12-08 DIAGNOSIS — N6489 Other specified disorders of breast: Secondary | ICD-10-CM

## 2017-12-08 DIAGNOSIS — R928 Other abnormal and inconclusive findings on diagnostic imaging of breast: Secondary | ICD-10-CM

## 2017-12-16 ENCOUNTER — Ambulatory Visit
Admission: RE | Admit: 2017-12-16 | Discharge: 2017-12-16 | Disposition: A | Discharge status: Home / Self Care | Payer: Medicare Other | Source: Ambulatory Visit | Attending: Family Medicine | Admitting: Family Medicine

## 2017-12-16 DIAGNOSIS — N6489 Other specified disorders of breast: Secondary | ICD-10-CM

## 2017-12-16 DIAGNOSIS — N6311 Unspecified lump in the right breast, upper outer quadrant: Secondary | ICD-10-CM | POA: Diagnosis not present

## 2017-12-16 DIAGNOSIS — R928 Other abnormal and inconclusive findings on diagnostic imaging of breast: Secondary | ICD-10-CM | POA: Insufficient documentation

## 2017-12-22 DIAGNOSIS — E119 Type 2 diabetes mellitus without complications: Secondary | ICD-10-CM | POA: Diagnosis not present

## 2017-12-22 LAB — HM DIABETES EYE EXAM

## 2018-01-05 ENCOUNTER — Ambulatory Visit (INDEPENDENT_AMBULATORY_CARE_PROVIDER_SITE_OTHER): Payer: Medicare Other | Admitting: Family Medicine

## 2018-01-05 ENCOUNTER — Telehealth: Payer: Self-pay | Admitting: Family Medicine

## 2018-01-05 ENCOUNTER — Encounter: Payer: Self-pay | Admitting: Family Medicine

## 2018-01-05 VITALS — BP 122/74 | HR 89 | Temp 98.2°F | Resp 16 | Wt 218.0 lb

## 2018-01-05 DIAGNOSIS — E669 Obesity, unspecified: Secondary | ICD-10-CM | POA: Diagnosis not present

## 2018-01-05 DIAGNOSIS — E119 Type 2 diabetes mellitus without complications: Secondary | ICD-10-CM

## 2018-01-05 DIAGNOSIS — E785 Hyperlipidemia, unspecified: Secondary | ICD-10-CM

## 2018-01-05 DIAGNOSIS — I10 Essential (primary) hypertension: Secondary | ICD-10-CM

## 2018-01-05 DIAGNOSIS — R6882 Decreased libido: Secondary | ICD-10-CM | POA: Diagnosis not present

## 2018-01-05 DIAGNOSIS — M05741 Rheumatoid arthritis with rheumatoid factor of right hand without organ or systems involvement: Secondary | ICD-10-CM | POA: Diagnosis not present

## 2018-01-05 DIAGNOSIS — M05742 Rheumatoid arthritis with rheumatoid factor of left hand without organ or systems involvement: Secondary | ICD-10-CM | POA: Diagnosis not present

## 2018-01-05 DIAGNOSIS — Z5181 Encounter for therapeutic drug level monitoring: Secondary | ICD-10-CM

## 2018-01-05 NOTE — Assessment & Plan Note (Signed)
Check lipids today; limit fatty meats, proud of her efforts

## 2018-01-05 NOTE — Telephone Encounter (Signed)
Elizabeth [name], please call Dr. Vernie Shanks office and find out why she tapered off the patient's gabapentin; I reviewed last note and don't see why Patient is having problems with nerve down the right hip and leg, lower back; she thinks it is sciatica Thank you

## 2018-01-05 NOTE — Assessment & Plan Note (Signed)
Check A1c; likely a little elevated after prednisone and cortisone shot; foot exam UTD by MD; eye exam UTD

## 2018-01-05 NOTE — Assessment & Plan Note (Signed)
Seeing rheumatologist; still having pain, encouraged patient to contact her rheumatologist

## 2018-01-05 NOTE — Patient Instructions (Addendum)
Please call Dr. Annalee Genta and let her know that the meloxicam 7.5 mg isn't strong enough and see if she'll start the 15 mg strength Ask about pain medicine if needed Let's get labs today Keep up the good job with trying to eat healthy If you have not heard anything from my staff in a week about any orders/referrals/studies from today, please contact us here to follow-up [phone]

## 2018-01-05 NOTE — Assessment & Plan Note (Signed)
Monitor liver and kidneys 

## 2018-01-05 NOTE — Telephone Encounter (Signed)
Dr. Carlyn Reichert nurse called back, She said the Dr. Lenon [name] Elizabeth [name] was doing good and that's why they were stopping medication.  But if Elizabeth [name] was having problems they will re prescribe and send in refills and will notify Elizabeth [name] of this.

## 2018-01-05 NOTE — Assessment & Plan Note (Signed)
Well controlled 

## 2018-01-05 NOTE — Telephone Encounter (Signed)
Called Dr. Meda Coffee, left message with nurse to call back regarding message

## 2018-01-05 NOTE — Progress Notes (Signed)
BP 122/74   Pulse 89   Temp 98.2 F (36.8 C) (Oral)   Resp 16   Wt 218 lb (98.9 kg)   LMP 07/09/2014   SpO2 93%   BMI 34.14 kg/m    Subjective:    Patient ID: Elizabeth [name], female    DOB: [birth year], 52 y.o.   MRN: [id number]  HPI: Elizabeth [name] is a 52 y.o. female  Chief Complaint  Patient presents with  . Follow-up    6 months    HPI Patient is here for f/u  Since last visit, RIGHT hip pain and down the leg; rheum stopped the gabapentin (weaned) and it has flared back up;meloxicam did not help; hurting elbows, hips, back, knees; left shoulder is better; shot of cortisone in left shoulder and prednisone, ran sugars up, glucose in urine per ED labs, 248 and 249; ran IVF Still on MTX SQ  She had hemorrhoids, but cleared up; not using cream  Right breast mammo and [place] reviewed; probably benign  Obesity; moving treadmill into the house; buying a house  Decreased libido  Type 2 diabetes; doing good job with eating; goal FSBS under 140; checking FSBS daily; last 2 weeks, 136, 118 Lab Results  Component Value Date   HGBA1C 7.4 10/09/2017   High cholesterol; air fryer; not cooking in grease; not many eggs; not much cheese Lab Results  Component Value Date   CHOL 118 01/01/2017   CHOL 189 09/09/2016   CHOL 181 03/21/2016   Lab Results  Component Value Date   HDL 38 (L) 01/01/2017   HDL 40 (L) 09/09/2016   HDL 47 03/21/2016   Lab Results  Component Value Date   LDLCALC 51 01/01/2017   LDLCALC 119 09/09/2016   LDLCALC 112 (H) 03/21/2016   Lab Results  Component Value Date   TRIG 145 01/01/2017   TRIG 152 (H) 09/09/2016   TRIG 109 03/21/2016   Lab Results  Component Value Date   CHOLHDL 3.1 01/01/2017   CHOLHDL 4.7 09/09/2016   CHOLHDL 4.7 (H) 09/20/2015   No results found for: LDLDIRECT   Depression screen [facility] 2/9 01/05/2018 10/15/2017 07/02/2017 04/01/2017 01/01/2017  Decreased Interest 0 0 0 0 0  Down, Depressed, Hopeless 0 0 0 0 0  PHQ - 2 Score  0 0 0 0 0  Altered sleeping - - - - -  Tired, decreased energy - - - - -  Change in appetite - - - - -  Feeling bad or failure about yourself  - - - - -  Trouble concentrating - - - - -  Moving slowly or fidgety/restless - - - - -  Suicidal thoughts - - - - -  PHQ-9 Score - - - - -    Relevant past medical, surgical, family and social history reviewed Past Medical History:  Diagnosis Date  . Bipolar 1 disorder ([place])   . Depression   . Diabetes mellitus without complication ([place])   . Diverticulosis   . GERD (gastroesophageal reflux disease)   . Heart murmur    followed by PCP  . Hepatitis    B - "not active"  . Hypertension goal BP (blood pressure) < 130/80 01/17/2016  . Insomnia   . Irritable bowel syndrome   . Motion sickness    boats  . Obesity (BMI 30-39.9) 07/03/2015  . RA (rheumatoid arthritis) ([place])   . Sciatica    Past Surgical History:  Procedure Laterality Date  . BOWEL RESECTION    .  CHOLECYSTECTOMY  1997   [place], [place]  . COLONOSCOPY WITH PROPOFOL N/A 09/07/2015   Procedure: COLONOSCOPY WITH PROPOFOL;  Surgeon: Lucilla Lame, MD;  Location: [place];  Service: Endoscopy;  Laterality: N/A;  . ESOPHAGOGASTRODUODENOSCOPY (EGD) WITH ESOPHAGEAL DILATION    . ESOPHAGOGASTRODUODENOSCOPY (EGD) WITH PROPOFOL N/A 09/07/2015   Procedure: ESOPHAGOGASTRODUODENOSCOPY (EGD) WITH PROPOFOL;  Surgeon: Lucilla Lame, MD;  Location: [place];  Service: Endoscopy;  Laterality: N/A;  . PARTIAL COLECTOMY N/A 01/24/2016   Procedure: SIGMOID COLECTOMY;  Surgeon: Florene Glen, MD;  Location: [facility];  Service: General;  Laterality: N/A;  . POLYPECTOMY N/A 09/07/2015   Procedure: POLYPECTOMY;  Surgeon: Lucilla Lame, MD;  Location: [place];  Service: Endoscopy;  Laterality: N/A;  . TUBAL LIGATION     Family History  Problem Relation Age of Onset  . Hypertension Mother   . Diabetes Mother   . Arthritis/Rheumatoid Mother   . Hyperlipidemia Mother   .  Alcohol abuse Father   . Esophageal varices Father   . Diverticulosis Maternal Aunt   . Diverticulosis Maternal Grandmother   . Diabetes Maternal Grandmother   . Heart disease Maternal Grandmother   . Dementia Maternal Grandmother   . Diabetes Brother   . Aneurysm Maternal Grandfather   . Diabetes Paternal Grandmother   . Depression Daughter        bipolar disorder  . Breast cancer Neg Hx    Social History   Tobacco Use  . Smoking status: Former Smoker    Packs/day: 0.50    Types: Cigarettes    Last attempt to quit: 05/06/2015    Years since quitting: 2.6  . Smokeless tobacco: Never Used  Substance Use Topics  . Alcohol use: No    Alcohol/week: 0.0 oz  . Drug use: No   Interim medical history since last visit reviewed. Allergies and medications reviewed  Review of Systems Per HPI unless specifically indicated above     Objective:    BP 122/74   Pulse 89   Temp 98.2 F (36.8 C) (Oral)   Resp 16   Wt 218 lb (98.9 kg)   LMP 07/09/2014   SpO2 93%   BMI 34.14 kg/m   Wt Readings from Last 3 Encounters:  01/05/18 218 lb (98.9 kg)  12/05/17 215 lb (97.5 kg)  10/15/17 215 lb 6.4 oz (97.7 kg)    Physical Exam  Constitutional: She appears well-developed and well-nourished. No distress.  HENT:  Head: Normocephalic and atraumatic.  Eyes: EOM are normal. No scleral icterus.  Neck: No thyromegaly present.  Cardiovascular: Normal rate and regular rhythm.  Pulmonary/Chest: Effort normal and breath sounds normal. She has no wheezes.  Abdominal: Soft. Bowel sounds are normal. She exhibits no distension.  Musculoskeletal: Normal range of motion. She exhibits no edema.  Neurological: She is alert. She exhibits normal muscle tone.  Skin: Skin is warm and dry. No pallor.  Psychiatric: She has a normal mood and affect. Her behavior is normal. Judgment and thought content normal.   Diabetic Foot Form - Detailed   No data filed      Results for orders placed or performed  in visit on 12/23/17  HM DIABETES EYE EXAM  Result Value Ref Range   HM Diabetic Eye Exam No Retinopathy No Retinopathy      Assessment & Plan:   Problem List Items Addressed This Visit      Cardiovascular and Mediastinum   Hypertension goal BP (blood pressure) < 130/80 (Chronic)  Well-controlled        Endocrine   Diabetes mellitus type 2, controlled, without complications (HCC) - Primary (Chronic)    Check A1c; likely a little elevated after prednisone and cortisone shot; foot exam UTD by MD; eye exam UTD      Relevant Orders   Hemoglobin A1c     Musculoskeletal and Integument   Rheumatoid arthritis involving both hands ([place]) (Chronic)    Seeing rheumatologist; still having pain, encouraged patient to contact her rheumatologist        Other   Obesity (BMI 30-39.9) (Chronic)    Patient will work on weight loss      Medication monitoring encounter    Monitor liver and kidneys      Relevant Orders   COMPLETE METABOLIC PANEL WITH GFR   Hyperlipidemia LDL goal <100    Check lipids today; limit fatty meats, proud of her efforts      Relevant Orders   Lipid panel    Other Visit Diagnoses    Decreased libido       discussed multifactorial; suggested counseling, lubrication, finding other ways to express intimacy with partner if she does not desire intercourse       Follow up plan: Return in about 3 months (around 04/05/2018) for follow-up visit with Dr. Sanda Klein.  An after-visit summary was printed and given to the patient at [place].  Please see the patient instructions which may contain other information and recommendations beyond what is mentioned above in the assessment and plan.  No orders of the defined types were placed in this encounter.   Orders Placed This Encounter  Procedures  . Lipid panel  . Hemoglobin A1c  . COMPLETE METABOLIC PANEL WITH GFR

## 2018-01-05 NOTE — Assessment & Plan Note (Signed)
Patient will work on weight loss.

## 2018-01-06 LAB — HEMOGLOBIN A1C
EAG (MMOL/L): 8.1 (calc)
HEMOGLOBIN A1C: 6.7 %{Hb} — AB (ref <5.7)
Mean Plasma Glucose: 146 (calc)

## 2018-01-06 LAB — LIPID PANEL
Cholesterol: 126 mg/dL (ref <200)
HDL: 51 mg/dL (ref >50)
LDL Cholesterol (Calc): 55 mg/dL (calc)
Non-HDL Cholesterol (Calc): 75 mg/dL (calc) (ref <130)
Total CHOL/HDL Ratio: 2.5 (calc) (ref <5.0)
Triglycerides: 110 mg/dL (ref <150)

## 2018-01-06 LAB — COMPLETE METABOLIC PANEL WITH GFR
AG Ratio: 1.5 (calc) (ref 1.0–2.5)
ALBUMIN MSPROF: 4 g/dL (ref 3.6–5.1)
ALKALINE PHOSPHATASE (APISO): 85 U/L (ref 33–130)
ALT: 24 U/L (ref 6–29)
AST: 17 U/L (ref 10–35)
BUN: 14 mg/dL (ref 7–25)
CO2: 29 mmol/L (ref 20–32)
CREATININE: 0.77 mg/dL (ref 0.50–1.05)
Calcium: 9.6 mg/dL (ref 8.6–10.4)
Chloride: 104 mmol/L (ref 98–110)
GFR, Est African American: 104 mL/min/{1.73_m2} (ref >=60)
GFR, Est Non African American: 90 mL/min/{1.73_m2} (ref >=60)
GLUCOSE: 142 mg/dL — AB (ref 65–99)
Globulin: 2.7 g/dL (calc) (ref 1.9–3.7)
Potassium: 4.3 mmol/L (ref 3.5–5.3)
Sodium: 139 mmol/L (ref 135–146)
Total Bilirubin: 0.6 mg/dL (ref 0.2–1.2)
Total Protein: 6.7 g/dL (ref 6.1–8.1)

## 2018-01-14 ENCOUNTER — Other Ambulatory Visit: Payer: Self-pay | Admitting: Family Medicine

## 2018-01-14 MED ORDER — ACCU-CHEK FASTCLIX LANCETS MISC: 3 refills | Status: DC

## 2018-01-14 NOTE — Progress Notes (Signed)
Rx written and sent to [facility] for lancets only

## 2018-01-24 ENCOUNTER — Other Ambulatory Visit: Payer: Self-pay | Admitting: Family Medicine

## 2018-01-24 NOTE — Telephone Encounter (Signed)
Lab Results  Component Value Date   HGBA1C 6.7 (H) 01/05/2018

## 2018-01-25 NOTE — Telephone Encounter (Signed)
Discovered an old refill request that did not reach me; may be computer glitch Contacting IT I have seen patient since this request Closing out old request, note to pharmacy, pt to call if needed  Routing History   Priority Sent On From To Message Type   11/04/2017 2:38 PM [name], Elizabeth, [name], Elizabeth [name] Rx Request   11/04/2017 2:25 PM Interface, [place] [name] RX REFILL   Created by   [employer], Surescripts Out on 11/04/2017 02:25 PM

## 2018-01-25 NOTE — Progress Notes (Signed)
Closing out lab/order note open since:  2018 

## 2018-02-05 DIAGNOSIS — Z79899 Other long term (current) drug therapy: Secondary | ICD-10-CM | POA: Diagnosis not present

## 2018-02-11 ENCOUNTER — Telehealth: Payer: Self-pay | Admitting: Family Medicine

## 2018-02-11 DIAGNOSIS — E119 Type 2 diabetes mellitus without complications: Secondary | ICD-10-CM

## 2018-02-11 NOTE — Telephone Encounter (Signed)
Copied from [place]. Topic: Quick Communication - See Telephone Encounter >> Feb 11, 2018  3:49 PM Lolita Rieger, RMA wrote: CRM for notification. See Telephone encounter for:   02/11/18.pt called and stated that her glucose meter has not been operating properly and she would like a new meter pt will call back once she has spoken to her insurance company and finds out which is covered

## 2018-02-11 NOTE — Telephone Encounter (Signed)
That's fine When she calls back, please enter NEW meter, test strips in the med list, and include BID testing, diagnosis code, and LON 99 months in the SIG please Thank you

## 2018-02-12 MED ORDER — ACCU-CHEK NANO SMARTVIEW W/DEVICE KIT
PACK | 0 refills | Status: DC
Start: 2018-02-12 — End: 2020-07-04

## 2018-02-12 NOTE — Telephone Encounter (Signed)
Meter order pended. Lancets and test strips for this meter are the same as what she has previously used and they were just sent in 01/24/17 so those should be fine. Meter will be automatically added to med list when rx is approved. Pt states she is only testing once daily, however MD says BID. Please advise.

## 2018-02-12 NOTE — Telephone Encounter (Signed)
Pt calling back with meter info: accu check smart view nano Lancets, test strips   Pt test 1 X a  Day   [facility] [place], [place] [phone] (Phone) [phone] (Fax)

## 2018-02-12 NOTE — Addendum Note (Signed)
Addended by: Donalee Citrin on: 02/12/2018 10:08 AM   Modules accepted: Orders

## 2018-02-12 NOTE — Addendum Note (Signed)
Addended by: Shallon Yaklin, Satira Anis on: 02/12/2018 12:34 PM   Modules accepted: Orders

## 2018-02-12 NOTE — Telephone Encounter (Signed)
Once a day is fine, new Rx sent

## 2018-02-27 ENCOUNTER — Other Ambulatory Visit: Payer: Self-pay | Admitting: Family Medicine

## 2018-02-27 NOTE — Telephone Encounter (Signed)
Last sgpt and lipids reviewed; Rx approved 

## 2018-03-26 ENCOUNTER — Ambulatory Visit: Payer: Medicare Other | Admitting: Family Medicine

## 2018-03-27 ENCOUNTER — Ambulatory Visit (INDEPENDENT_AMBULATORY_CARE_PROVIDER_SITE_OTHER): Payer: Medicare Other

## 2018-03-27 VITALS — BP 108/70 | HR 76 | Temp 98.8°F | Resp 12 | Ht 67.0 in | Wt 218.0 lb

## 2018-03-27 DIAGNOSIS — Z0000 Encounter for general adult medical examination without abnormal findings: Secondary | ICD-10-CM

## 2018-03-27 NOTE — Progress Notes (Signed)
Subjective:   Elizabeth [name] is a 52 y.o. female who presents for Medicare Annual (Subsequent) preventive examination.  Review of Systems:  N/A Cardiac Risk Factors include: diabetes mellitus;dyslipidemia;hypertension;obesity (BMI >30kg/m2);sedentary lifestyle     Objective:     Vitals: BP 108/70 (BP Location: Right Arm, Patient Position: Sitting, Cuff Size: Large)   Pulse 76   Temp 98.8 F (37.1 C) (Oral)   Resp 12   Ht _0  (1.702 m)   Wt 218 lb (98.9 kg)   LMP 07/09/2014   SpO2 96%   BMI 34.14 kg/m   Body mass index is 34.14 kg/m.  Advanced Directives 03/27/2018 12/05/2017 08/26/2017 07/11/2017 07/02/2017 04/01/2017 01/01/2017  Does Patient Have a Medical Advance Directive? _1  No No  Does patient want to make changes to medical advance directive? Yes (MAU/Ambulatory/Procedural Areas - Information given) - - - - - -  Would patient like information on creating a medical advance directive? - No - Patient declined - - - - -    Tobacco Social History   Tobacco Use  Smoking Status Former Smoker  . Packs/day: 1.00  . Years: 1.00  . Pack years: 1.00  . Types: Cigarettes  . Last attempt to quit: 05/06/2015  . Years since quitting: 2.8  Smokeless Tobacco Never Used  Tobacco Comment   smoking cessation materials not required     Counseling given: No Comment: smoking cessation materials not required   Clinical Intake:  Pre-visit preparation completed: Yes  Pain : No/denies pain   BMI - recorded: 34.14 Nutritional Status: BMI > 30  Obese Nutrition Risk Assessment: Has the patient had any N/V/D within the last 2 months?  No Does the patient have any non-healing wounds?  No Has the patient had any unintentional weight loss or weight gain?  No  Is the patient diabetic?  Yes If diabetic, was a CBG obtained today?  No Did the patient bring in their glucometer from home?  No Comments: Pt monitors CBG's daily. Denies any financial strains with the device or  supplies.  Diabetic Exams: Diabetic Eye Exam: Completed 12/16/17 Diabetic Foot Exam: Completed 10/15/17 How often do you need to have someone help you when you read instructions, pamphlets, or other written materials from your doctor or pharmacy?: 1 - Never  Interpreter Needed?: No  Information entered by :: AEversole, LPN  Hospitalizations/ED visits and surgeries occurring within the previous 12 months:  Within the previous 12 months, pt was [facility] at [facility] ED on 12/05/17 for hyperglycemia and RA flare, treated by Dr. Jimmye Norman. Pt was not seen in follow up.   In addition, pt has not underwent any surgical procedures within the past 12 months.  Past Medical History:  Diagnosis Date  . Bipolar 1 disorder ([place])   . Depression   . Diabetes mellitus without complication ([place])   . Diverticulosis   . GERD (gastroesophageal reflux disease)   . Heart murmur    followed by PCP  . Hepatitis    B - "not active"  . Hypertension goal BP (blood pressure) < 130/80 01/17/2016  . Insomnia   . Irritable bowel syndrome   . Motion sickness    boats  . Obesity (BMI 30-39.9) 07/03/2015  . RA (rheumatoid arthritis) ([place])   . Sciatica    Past Surgical History:  Procedure Laterality Date  . BOWEL RESECTION    . CHOLECYSTECTOMY  1997   [place], [place]  . COLONOSCOPY WITH PROPOFOL N/A 09/07/2015  Procedure: COLONOSCOPY WITH PROPOFOL;  Surgeon: Lucilla Lame, MD;  Location: [place];  Service: Endoscopy;  Laterality: N/A;  . ESOPHAGOGASTRODUODENOSCOPY (EGD) WITH ESOPHAGEAL DILATION    . ESOPHAGOGASTRODUODENOSCOPY (EGD) WITH PROPOFOL N/A 09/07/2015   Procedure: ESOPHAGOGASTRODUODENOSCOPY (EGD) WITH PROPOFOL;  Surgeon: Lucilla Lame, MD;  Location: [place];  Service: Endoscopy;  Laterality: N/A;  . PARTIAL COLECTOMY N/A 01/24/2016   Procedure: SIGMOID COLECTOMY;  Surgeon: Florene Glen, MD;  Location: [facility];  Service: General;  Laterality: N/A;  . POLYPECTOMY N/A 09/07/2015   Procedure:  POLYPECTOMY;  Surgeon: Lucilla Lame, MD;  Location: [place];  Service: Endoscopy;  Laterality: N/A;  . TUBAL LIGATION     Family History  Problem Relation Age of Onset  . Hypertension Mother   . Diabetes Mother   . Arthritis/Rheumatoid Mother   . Hyperlipidemia Mother   . Alcohol abuse Father   . Esophageal varices Father   . Diverticulosis Maternal Aunt   . Diverticulosis Maternal Grandmother   . Diabetes Maternal Grandmother   . Heart disease Maternal Grandmother   . Dementia Maternal Grandmother   . Diabetes Brother   . Kidney disease Brother   . Aneurysm Maternal Grandfather   . Diabetes Paternal Grandmother   . Healthy Brother   . Depression Daughter        bipolar disorder  . Depression Daughter        bipolar disorder  . Breast cancer Neg Hx    Social History   Socioeconomic History  . Marital status: Married    Spouse name: Mortimer Fries  . Number of children: 3  . Years of education: some college  . Highest education level: 12th grade  Occupational History  . Occupation: Disabled  Social Needs  . Financial resource strain: Not hard at all  . Food insecurity:    Worry: Never true    Inability: Never true  . Transportation needs:    Medical: No    Non-medical: No  Tobacco Use  . Smoking status: Former Smoker    Packs/day: 1.00    Years: 1.00    Pack years: 1.00    Types: Cigarettes    Last attempt to quit: 05/06/2015    Years since quitting: 2.8  . Smokeless tobacco: Never Used  . Tobacco comment: smoking cessation materials not required  Substance and Sexual Activity  . Alcohol use: No    Alcohol/week: 0.0 oz  . Drug use: No  . Sexual activity: Not Currently    Birth control/protection: None  Lifestyle  . Physical activity:    Days per week: 0 days    Minutes per session: 0 min  . Stress: Not at all  Relationships  . Social connections:    Talks on phone: Patient refused    Gets together: Patient refused    Attends religious service:  Patient refused    Active member of club or organization: Patient refused    Attends meetings of clubs or organizations: Patient refused    Relationship status: Married  Other Topics Concern  . Not on file  Social History Narrative  . Not on file    Outpatient Encounter Medications as of 03/27/2018  Medication Sig  . ACCU-CHEK FASTCLIX LANCETS MISC May check fingerstick blood sugar once a day if desired;  E11.9; bring readings to appt  . ACCU-CHEK SMARTVIEW test strip TEST BLOOD SUGAR twice a day  . aspirin 81 MG tablet Take 81 mg by mouth daily.  Marland Kitchen atorvastatin (LIPITOR)  10 MG tablet take 1 tablet by mouth at bedtime  . B-D INS SYR ULTRAFINE 1CC/31G 31G X 5/16" 1 ML MISC use as directed TO INJECT METHOTREXATE  . Blood Glucose Monitoring Suppl (ACCU-CHEK NANO SMARTVIEW) w/Device KIT Use as directed once daily. LON 99, C58.5  . folic acid (FOLVITE) 1 MG tablet Take 1 mg by mouth daily.  Marland Kitchen gabapentin (NEURONTIN) 300 MG capsule Take 1 capsule (300 mg total) by mouth 3 (three) times daily.  Marland Kitchen LINZESS 290 MCG CAPS capsule take 1 capsule by mouth once daily  . methotrexate 50 MG/2ML injection inject 0.6 milliliter subcutaneously every week  . Multiple Vitamins-Minerals (MULTIVITAMIN/EXTRA VITAMIN D3 PO) Take by mouth.  . OLANZapine (ZYPREXA) 15 MG tablet Take 15 mg by mouth at bedtime.  . traZODone (DESYREL) 150 MG tablet Take 150 mg by mouth at bedtime.  . cyclobenzaprine (FLEXERIL) 5 MG tablet Take 1 tablet (5 mg total) by mouth at bedtime. (Patient not taking: Reported on 10/15/2017)  . [DISCONTINUED] oxyCODONE-acetaminophen (PERCOCET) 5-325 MG tablet Take 1 tablet by mouth every 8 (eight) hours as needed.  . [DISCONTINUED] Probiotic Product (PRO-BIOTIC BLEND PO) Take 1 tablet daily by mouth.   No facility-administered encounter medications on file as of 03/27/2018.     Activities of Daily Living In your present state of health, do you have any difficulty performing the following  activities: 03/27/2018 01/05/2018  Hearing? N N  Comment denies hearing aids -  Vision? N Y  Comment reading glasses -  Difficulty concentrating or making decisions? Y N  Comment Short term memory loss -  Walking or climbing stairs? Y N  Comment sciatica -  Dressing or bathing? N N  Doing errands, shopping? N N  Preparing Food and eating ? N -  Comment full upper and partial lower dentures -  Using the Toilet? N -  In the past six months, have you accidently leaked urine? N -  Do you have problems with loss of bowel control? N -  Managing your Medications? N -  Managing your Finances? N -  Housekeeping or managing your Housekeeping? N -  Some recent data might be hidden    Patient Care Team: Lada, Satira Anis, MD as PCP - General (Family Medicine) Marlowe Sax, MD as Consulting Physician (Internal Medicine)    Assessment:   This is a routine wellness examination for Elizabeth [name].  Exercise Activities and Dietary recommendations Current Exercise Habits: The patient does not participate in regular exercise at present, Exercise limited by: None identified  Goals    . DIET - reduce carb intake     Recommend to decrease portion sizes by eating 3 small healthy meals and at least 2 healthy snacks per day.       Fall Risk Fall Risk  03/27/2018 01/05/2018 10/15/2017 10/09/2017 07/02/2017  Falls in the past year? Yes No No No No  Comment fell while painting - - - -  Number falls in past yr: 2 or more - - - -  Injury with Fall? Yes - - - -  Comment twisted back - - - -  Risk for fall due to : Other (Comment) - - - -  Risk for fall due to: Comment sciatica, RA - - - -  Follow up Falls evaluation completed;Education provided;Falls prevention discussed - - - -   Is the home free of loose throw rugs in walkways, pet beds, electrical cords, etc? Yes Adequate lighting to reduce risk of falls?  Yes In  addition, does the patient have any of the following: Stairs in or around the home WITH  handrails? No Grab bars in the bathroom? Yes  Shower chair or a place to sit while bathing? No Use of an elevated toilet seat or a handicapped toilet? No Use of a cane, walker or w/c? Yes, walker at times  Timed Get Up and Go Performed: Yes. Pt ambulated 10 feet within 12 sec. Gait slow, steady and without the use of an assistive device. No intervention required at this time. Fall risk prevention has been discussed.  Pt declined my offer to send Community Resource Referral to Care Guide for a shower chair or an elevated toilet seat.  Depression Screen PHQ 2/9 Scores 03/27/2018 01/05/2018 10/15/2017 07/02/2017  PHQ - 2 Score 0 0 0 0  PHQ- 9 Score 0 - - -     Cognitive Function     6CIT Screen 03/27/2018  What Year? 0 points  What month? 0 points  What time? 0 points  Count back from 20 0 points  Months in reverse 0 points  Repeat phrase 0 points  Total Score 0    Immunization History  Administered Date(s) Administered  . Hepatitis A, Adult 11/12/2017  . Influenza,inj,Quad PF,6+ Mos 08/06/2017  . Influenza-Unspecified 08/06/2017  . Pneumococcal Polysaccharide-23 11/04/2017  . Td 11/04/2017  . Tdap 11/29/2015, 11/04/2017  . Zoster Recombinat (Shingrix) 01/08/2018    Qualifies for Shingles Vaccine? Yes. 1st Shingrix vaccine given 01/08/18. Will be due for 2nd dose within 6 months.  Screening Tests Health Maintenance  Topic Date Due  . URINE MICROALBUMIN  04/01/2018  . HEMOGLOBIN A1C  07/05/2018  . INFLUENZA VACCINE  07/09/2018  . FOOT EXAM  10/15/2018  . PAP SMEAR  11/29/2018  . MAMMOGRAM  12/05/2018  . OPHTHALMOLOGY EXAM  12/22/2018  . COLONOSCOPY  09/06/2020  . PNEUMOCOCCAL POLYSACCHARIDE VACCINE (2) 09/10/2020  . TETANUS/TDAP  11/05/2027  . HIV Screening  Completed    Cancer Screenings: Lung: Low Dose CT Chest recommended if Age 75-80 years, 30 pack-year currently smoking OR have quit w/in 15years. Patient does not qualify. Breast:  Up to date on Mammogram? Yes.  Completed 12/16/17. Will required diagnostic mammogram and [place] within 6 months.   Up to date of Bone Density/Dexa? No. Not yet required Colorectal: Completed colonoscopy 09/07/15. Repeat every 5 years  Additional Screenings: Hepatitis C Screening: Completed 11/08/15    Plan:  I have personally reviewed and addressed the Medicare Annual Wellness questionnaire and have noted the following in the patient's chart:  A. Medical and social history B. Use of alcohol, tobacco or illicit drugs  C. Current medications and supplements D. Functional ability and status E.  Nutritional status F.  Physical activity G. Advance directives H. List of other physicians I.  Hospitalizations, surgeries, and ER visits in previous 12 months J.  [place] such as hearing and vision if needed, cognitive and depression L. Referrals and appointments  In addition, I have reviewed and discussed with patient certain preventive protocols, quality metrics, and best practice recommendations. A written personalized care plan for preventive services as well as general preventive health recommendations were provided to patient.  See attached scanned questionnaire for additional information.   Signed,  Aleatha Borer, LPN Nurse Health Advisor

## 2018-03-27 NOTE — Patient Instructions (Signed)
Elizabeth [name] , Thank you for taking time to come for your Medicare Wellness Visit. I appreciate your ongoing commitment to your health goals. Please review the following plan we discussed and let me know if I can assist you in the future.   Screening recommendations/referrals: Colorectal Screening: Completed colonoscopy 09/07/15. Repeat every 5 years Mammogram: Completed 12/16/17. Will required diagnostic mammogram and ultrasound within 6 months Bone Density: Does not yet qualify Lung Cancer Screening: You do not qualify for this screening Hepatitis C Screening: Completed 11/08/15  Vision and Dental Exams: Recommended annual ophthalmology exams for early detection of glaucoma and other disorders of the eye Recommended annual dental exams for proper oral hygiene  Diabetic Exams: Recommended annual diabetic eye exams for early detection of retinopathy Recommended annual diabetic foot exams for early detection of peripheral neuropathy.  Diabetic Eye Exam: Completed 12/22/17 Diabetic Foot Exam: Completed 10/15/17  Vaccinations: Influenza vaccine: Up to date Pneumococcal vaccine: Not required until after age 46 Tdap vaccine: Up to date Shingles vaccine: Completed 1st dose of Shingrix 01/08/18. Repeat 2nd dose within 6 months    Advanced directives: Advance directive discussed with you today. I have provided a copy for you to complete at home and have notarized. Once this is complete please bring a copy in to our office so we can scan it into your chart.  Conditions/risks identified: Recommend to decrease portion sizes by eating 3 small healthy meals and at least 2 healthy snacks per day.  Next appointment: Please schedule your Annual Wellness Visit with your Nurse Health Advisor in one year.  Preventive Care 40-64 Years, Female Preventive care refers to lifestyle choices and visits with your health care provider that can promote health and wellness. What does preventive care include?  A  yearly physical exam. This is also called an annual well check.  Dental exams once or twice a year.  Routine eye exams. Ask your health care provider how often you should have your eyes checked.  Personal lifestyle choices, including:  Daily care of your teeth and gums.  Regular physical activity.  Eating a healthy diet.  Avoiding tobacco and drug use.  Limiting alcohol use.  Practicing safe sex.  Taking low-dose aspirin daily starting at age 36.  Taking vitamin and mineral supplements as recommended by your health care provider. What happens during an annual well check? The services and screenings done by your health care provider during your annual well check will depend on your age, overall health, lifestyle risk factors, and family history of disease. Counseling  Your health care provider may ask you questions about your:  Alcohol use.  Tobacco use.  Drug use.  Emotional well-being.  Home and relationship well-being.  Sexual activity.  Eating habits.  Work and work Statistician.  Method of birth control.  Menstrual cycle.  Pregnancy history. Screening  You may have the following tests or measurements:  Height, weight, and BMI.  Blood pressure.  Lipid and cholesterol levels. These may be checked every 5 years, or more frequently if you are over 45 years old.  Skin check.  Lung cancer screening. You may have this screening every year starting at age 32 if you have a 30-pack-year history of smoking and currently smoke or have quit within the past 15 years.  Fecal occult blood test (FOBT) of the stool. You may have this test every year starting at age 53.  Flexible sigmoidoscopy or colonoscopy. You may have a sigmoidoscopy every 5 years or a colonoscopy every 10  years starting at age 73.  Hepatitis C blood test.  Hepatitis B blood test.  Sexually transmitted disease (STD) testing.  Diabetes screening. This is done by checking your blood sugar  (glucose) after you have not eaten for a while (fasting). You may have this done every 1-3 years.  Mammogram. This may be done every 1-2 years. Talk to your health care provider about when you should start having regular mammograms. This may depend on whether you have a family history of breast cancer.  BRCA-related cancer screening. This may be done if you have a family history of breast, ovarian, tubal, or peritoneal cancers.  Pelvic exam and Pap test. This may be done every 3 years starting at age 7. Starting at age 49, this may be done every 5 years if you have a Pap test in combination with an HPV test.  Bone density scan. This is done to screen for osteoporosis. You may have this scan if you are at high risk for osteoporosis. Discuss your test results, treatment options, and if necessary, the need for more tests with your health care provider. Vaccines  Your health care provider may recommend certain vaccines, such as:  Influenza vaccine. This is recommended every year.  Tetanus, diphtheria, and acellular pertussis (Tdap, Td) vaccine. You may need a Td booster every 10 years.  Zoster vaccine. You may need this after age 31.  Pneumococcal 13-valent conjugate (PCV13) vaccine. You may need this if you have certain conditions and were not previously vaccinated.  Pneumococcal polysaccharide (PPSV23) vaccine. You may need one or two doses if you smoke cigarettes or if you have certain conditions. Talk to your health care provider about which screenings and vaccines you need and how often you need them. This information is not intended to replace advice given to you by your health care provider. Make sure you discuss any questions you have with your health care provider. Document Released: 12/22/2015 Document Revised: 08/14/2016 Document Reviewed: 09/26/2015 Elsevier Interactive Patient Education  2017 [place] Prevention in the Home Falls can cause injuries. They can  happen to people of all ages. There are many things you can do to make your home safe and to help prevent falls. What can I do on the outside of my home?  Regularly fix the edges of walkways and driveways and fix any cracks.  Remove anything that might make you trip as you walk through a door, such as a raised step or threshold.  Trim any bushes or trees on the path to your home.  Use bright outdoor lighting.  Clear any walking paths of anything that might make someone trip, such as rocks or tools.  Regularly check to see if handrails are loose or broken. Make sure that both sides of any steps have handrails.  Any raised decks and porches should have guardrails on the edges.  Have any leaves, snow, or ice cleared regularly.  Use sand or salt on walking paths during winter.  Clean up any spills in your garage right away. This includes oil or grease spills. What can I do in the bathroom?  Use night lights.  Install grab bars by the toilet and in the tub and shower. Do not use towel bars as grab bars.  Use non-skid mats or decals in the tub or shower.  If you need to sit down in the shower, use a plastic, non-slip stool.  Keep the floor dry. Clean up any water that spills on  the floor as soon as it happens.  Remove soap buildup in the tub or shower regularly.  Attach bath mats securely with double-sided non-slip rug tape.  Do not have throw rugs and other things on the floor that can make you trip. What can I do in the bedroom?  Use night lights.  Make sure that you have a light by your bed that is easy to reach.  Do not use any sheets or blankets that are too big for your bed. They should not hang down onto the floor.  Have a firm chair that has side arms. You can use this for support while you get dressed.  Do not have throw rugs and other things on the floor that can make you trip. What can I do in the kitchen?  Clean up any spills right away.  Avoid walking on  wet floors.  Keep items that you use a lot in easy-to-reach places.  If you need to reach something above you, use a strong step stool that has a grab bar.  Keep electrical cords out of the way.  Do not use floor polish or wax that makes floors slippery. If you must use wax, use non-skid floor wax.  Do not have throw rugs and other things on the floor that can make you trip. What can I do with my stairs?  Do not leave any items on the stairs.  Make sure that there are handrails on both sides of the stairs and use them. Fix handrails that are broken or loose. Make sure that handrails are as long as the stairways.  Check any carpeting to make sure that it is firmly attached to the stairs. Fix any carpet that is loose or worn.  Avoid having throw rugs at the top or bottom of the stairs. If you do have throw rugs, attach them to the floor with carpet tape.  Make sure that you have a light switch at the top of the stairs and the bottom of the stairs. If you do not have them, ask someone to add them for you. What else can I do to help prevent falls?  Wear shoes that:  Do not have high heels.  Have rubber bottoms.  Are comfortable and fit you well.  Are closed at the toe. Do not wear sandals.  If you use a stepladder:  Make sure that it is fully opened. Do not climb a closed stepladder.  Make sure that both sides of the stepladder are locked into place.  Ask someone to hold it for you, if possible.  Clearly mark and make sure that you can see:  Any grab bars or handrails.  First and last steps.  Where the edge of each step is.  Use tools that help you move around (mobility aids) if they are needed. These include:  Canes.  Walkers.  Scooters.  Crutches.  Turn on the lights when you go into a dark area. Replace any light bulbs as soon as they burn out.  Set up your furniture so you have a clear path. Avoid moving your furniture around.  If any of your floors are  uneven, fix them.  If there are any pets around you, be aware of where they are.  Review your medicines with your doctor. Some medicines can make you feel dizzy. This can increase your chance of falling. Ask your doctor what other things that you can do to help prevent falls. This information is not intended to  replace advice given to you by your health care provider. Make sure you discuss any questions you have with your health care provider. Document Released: 09/21/2009 Document Revised: 05/02/2016 Document Reviewed: 12/30/2014 Elsevier Interactive Patient Education  2017 [employer].

## 2018-04-03 DIAGNOSIS — R202 Paresthesia of skin: Secondary | ICD-10-CM | POA: Diagnosis not present

## 2018-04-03 DIAGNOSIS — M0609 Rheumatoid arthritis without rheumatoid factor, multiple sites: Secondary | ICD-10-CM | POA: Diagnosis not present

## 2018-04-03 DIAGNOSIS — Z79899 Other long term (current) drug therapy: Secondary | ICD-10-CM | POA: Diagnosis not present

## 2018-04-03 DIAGNOSIS — R200 Anesthesia of skin: Secondary | ICD-10-CM | POA: Diagnosis not present

## 2018-04-07 ENCOUNTER — Telehealth: Payer: Self-pay

## 2018-04-07 NOTE — Telephone Encounter (Signed)
Copied from [place] [phone]. Topic: Inquiry >> Apr 06, 2018  3:46 PM Vernona Rieger wrote: Reason for CRM: The Neurology dept at [facility] called and needs a prior authorization for her to be seen since she has Medicare & Medicaid. It is for a nerve conduction study.  Call back is [phone]. They turn off phones at 4. Please call Tuesday 4/30.  I spoke with Nevin Bloodgood at [facility] and she informed me that Dr. Osa Craver referrred this patient to them for a nerve conduction study and needed Dr. Delight Ovens NPI # since she has Medicare/ Medicaid. I informed her that Dr. Sanda Klein was out of the office all this week and will have to get permission from my office manager.  After consulting with Miel, Dr. Delight Ovens NPI was given so this patient could proceed with the nerve conduction study.

## 2018-04-08 DIAGNOSIS — R200 Anesthesia of skin: Secondary | ICD-10-CM | POA: Diagnosis not present

## 2018-04-11 ENCOUNTER — Other Ambulatory Visit: Payer: Self-pay | Admitting: Family Medicine

## 2018-04-11 DIAGNOSIS — G8929 Other chronic pain: Secondary | ICD-10-CM

## 2018-04-11 DIAGNOSIS — M5440 Lumbago with sciatica, unspecified side: Principal | ICD-10-CM

## 2018-04-16 ENCOUNTER — Ambulatory Visit (INDEPENDENT_AMBULATORY_CARE_PROVIDER_SITE_OTHER): Payer: Medicare Other | Admitting: Family Medicine

## 2018-04-16 ENCOUNTER — Encounter: Payer: Self-pay | Admitting: Family Medicine

## 2018-04-16 VITALS — BP 118/82 | HR 84 | Temp 98.0°F | Resp 16 | Ht 67.0 in | Wt 219.8 lb

## 2018-04-16 DIAGNOSIS — G8929 Other chronic pain: Secondary | ICD-10-CM | POA: Diagnosis not present

## 2018-04-16 DIAGNOSIS — M05741 Rheumatoid arthritis with rheumatoid factor of right hand without organ or systems involvement: Secondary | ICD-10-CM | POA: Diagnosis not present

## 2018-04-16 DIAGNOSIS — M5440 Lumbago with sciatica, unspecified side: Secondary | ICD-10-CM | POA: Diagnosis not present

## 2018-04-16 DIAGNOSIS — I10 Essential (primary) hypertension: Secondary | ICD-10-CM

## 2018-04-16 DIAGNOSIS — M5441 Lumbago with sciatica, right side: Secondary | ICD-10-CM

## 2018-04-16 DIAGNOSIS — M05742 Rheumatoid arthritis with rheumatoid factor of left hand without organ or systems involvement: Secondary | ICD-10-CM | POA: Diagnosis not present

## 2018-04-16 DIAGNOSIS — Z5181 Encounter for therapeutic drug level monitoring: Secondary | ICD-10-CM | POA: Diagnosis not present

## 2018-04-16 DIAGNOSIS — E119 Type 2 diabetes mellitus without complications: Secondary | ICD-10-CM | POA: Diagnosis not present

## 2018-04-16 DIAGNOSIS — E785 Hyperlipidemia, unspecified: Secondary | ICD-10-CM | POA: Diagnosis not present

## 2018-04-16 MED ORDER — GABAPENTIN 300 MG PO CAPS: ORAL_CAPSULE | ORAL | 5 refills | Status: DC

## 2018-04-16 NOTE — Assessment & Plan Note (Signed)
Well controlled 

## 2018-04-16 NOTE — Assessment & Plan Note (Signed)
Managed by rheumatologist.
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
## 2018-04-16 NOTE — Patient Instructions (Addendum)
Check out the information at familydoctor.org entitled "Nutrition for Weight Loss: What You Need to Know about Fad Diets" Try to lose between 1-2 pounds per week by taking in fewer calories and burning off more calories You can succeed by limiting portions, limiting foods dense in calories and fat, becoming more active, and drinking 8 glasses of water a day (64 ounces) Don't skip meals, especially breakfast, as skipping meals may alter your metabolism Do not use over-the-counter weight loss pills or gimmicks that claim rapid weight loss A healthy BMI (or body mass index) is between 18.5 and 24.9 You can calculate your ideal BMI at the NIH website ClubMonetize.fr Try to limit saturated fats in your diet (bologna, hot dogs, barbeque, cheeseburgers, hamburgers, steak, bacon, sausage, cheese, etc.) and get more fresh fruits, vegetables, and whole grains Please do see your eye doctor regularly, and have your eyes examined every year (or more often per his or her recommendation) Check your feet every night and let me know right away of any sores, infections, numbness, etc. Try to limit sweets, white bread, white rice, white potatoes   Back Exercises The following exercises strengthen the muscles that help to support the back. They also help to keep the lower back flexible. Doing these exercises can help to prevent back pain or lessen existing pain. If you have back pain or discomfort, try doing these exercises 2-3 times each day or as told by your health care provider. When the pain goes away, do them once each day, but increase the number of times that you repeat the steps for each exercise (do more repetitions). If you do not have back pain or discomfort, do these exercises once each day or as told by your health care provider. Exercises Single Knee to Chest  Repeat these steps 3-5 times for each leg: 1. Lie on your back on a firm bed or the floor  with your legs extended. 2. Bring one knee to your chest. Your other leg should stay extended and in contact with the floor. 3. Hold your knee in place by grabbing your knee or thigh. 4. Pull on your knee until you feel a gentle stretch in your lower back. 5. Hold the stretch for 10-30 seconds. 6. Slowly release and straighten your leg.  Pelvic Tilt  Repeat these steps 5-10 times: 1. Lie on your back on a firm bed or the floor with your legs extended. 2. Bend your knees so they are pointing toward the ceiling and your feet are flat on the floor. 3. Tighten your lower abdominal muscles to press your lower back against the floor. This motion will tilt your pelvis so your tailbone points up toward the ceiling instead of pointing to your feet or the floor. 4. With gentle tension and even breathing, hold this position for 5-10 seconds.  Cat-Cow  Repeat these steps until your lower back becomes more flexible: 1. Get into a hands-and-knees position on a firm surface. Keep your hands under your shoulders, and keep your knees under your hips. You may place padding under your knees for comfort. 2. Let your head hang down, and point your tailbone toward the floor so your lower back becomes rounded like the back of a cat. 3. Hold this position for 5 seconds. 4. Slowly lift your head and point your tailbone up toward the ceiling so your back forms a sagging arch like the back of a cow. 5. Hold this position for 5 seconds.  Press-Ups  Repeat these  steps 5-10 times: 1. Lie on your abdomen (face-down) on the floor. 2. Place your palms near your head, about shoulder-width apart. 3. While you keep your back as relaxed as possible and keep your hips on the floor, slowly straighten your arms to raise the top half of your body and lift your shoulders. Do not use your back muscles to raise your upper torso. You may adjust the placement of your hands to make yourself more comfortable. 4. Hold this position  for 5 seconds while you keep your back relaxed. 5. Slowly return to lying flat on the floor.  Bridges  Repeat these steps 10 times: 1. Lie on your back on a firm surface. 2. Bend your knees so they are pointing toward the ceiling and your feet are flat on the floor. 3. Tighten your buttocks muscles and lift your buttocks off of the floor until your waist is at almost the same height as your knees. You should feel the muscles working in your buttocks and the back of your thighs. If you do not feel these muscles, slide your feet 1-2 inches farther away from your buttocks. 4. Hold this position for 3-5 seconds. 5. Slowly lower your hips to the starting position, and allow your buttocks muscles to relax completely.  If this exercise is too easy, try doing it with your arms crossed over your chest. Abdominal Crunches  Repeat these steps 5-10 times: 1. Lie on your back on a firm bed or the floor with your legs extended. 2. Bend your knees so they are pointing toward the ceiling and your feet are flat on the floor. 3. Cross your arms over your chest. 4. Tip your chin slightly toward your chest without bending your neck. 5. Tighten your abdominal muscles and slowly raise your trunk (torso) high enough to lift your shoulder blades a tiny bit off of the floor. Avoid raising your torso higher than that, because it can put too much stress on your low back and it does not help to strengthen your abdominal muscles. 6. Slowly return to your starting position.  Back Lifts Repeat these steps 5-10 times: 1. Lie on your abdomen (face-down) with your arms at your sides, and rest your forehead on the floor. 2. Tighten the muscles in your legs and your buttocks. 3. Slowly lift your chest off of the floor while you keep your hips pressed to the floor. Keep the back of your head in line with the curve in your back. Your eyes should be looking at the floor. 4. Hold this position for 3-5 seconds. 5. Slowly return  to your starting position.  Contact a health care provider if:  Your back pain or discomfort gets much worse when you do an exercise.  Your back pain or discomfort does not lessen within 2 hours after you exercise. If you have any of these problems, stop doing these exercises right away. Do not do them again unless your health care provider says that you can. Get help right away if:  You develop sudden, severe back pain. If this happens, stop doing the exercises right away. Do not do them again unless your health care provider says that you can. This information is not intended to replace advice given to you by your health care provider. Make sure you discuss any questions you have with your health care provider. Document Released: 01/02/2005 Document Revised: 04/03/2016 Document Reviewed: 01/19/2015 Elsevier Interactive Patient Education  2017 [employer].

## 2018-04-16 NOTE — Assessment & Plan Note (Signed)
Limit saturated fats; continue meds

## 2018-04-17 LAB — MICROALBUMIN / CREATININE URINE RATIO
Creatinine, Urine: 147 mg/dL (ref 20–275)
MICROALB UR: 0.4 mg/dL
Microalb Creat Ratio: 3 mcg/mg creat (ref <30)

## 2018-04-27 ENCOUNTER — Encounter: Payer: Self-pay | Admitting: Family Medicine

## 2018-04-28 ENCOUNTER — Ambulatory Visit: Payer: Medicare Other | Admitting: Family Medicine

## 2018-04-30 ENCOUNTER — Other Ambulatory Visit: Payer: Self-pay | Admitting: Family Medicine

## 2018-04-30 DIAGNOSIS — R928 Other abnormal and inconclusive findings on diagnostic imaging of breast: Secondary | ICD-10-CM

## 2018-05-07 ENCOUNTER — Ambulatory Visit: Payer: Medicare Other | Attending: Family Medicine | Admitting: Physical Therapy

## 2018-05-07 ENCOUNTER — Encounter: Payer: Self-pay | Admitting: Physical Therapy

## 2018-05-07 DIAGNOSIS — G8929 Other chronic pain: Secondary | ICD-10-CM | POA: Diagnosis not present

## 2018-05-07 DIAGNOSIS — M5441 Lumbago with sciatica, right side: Secondary | ICD-10-CM | POA: Insufficient documentation

## 2018-05-07 DIAGNOSIS — M5442 Lumbago with sciatica, left side: Secondary | ICD-10-CM | POA: Diagnosis not present
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
## 2018-05-11 ENCOUNTER — Encounter: Payer: Medicare Other | Admitting: Physical Therapy

## 2018-05-12 ENCOUNTER — Ambulatory Visit (INDEPENDENT_AMBULATORY_CARE_PROVIDER_SITE_OTHER): Payer: Medicare Other

## 2018-05-12 DIAGNOSIS — Z23 Encounter for immunization: Secondary | ICD-10-CM | POA: Diagnosis not present

## 2018-05-13 ENCOUNTER — Ambulatory Visit: Payer: Medicare Other

## 2018-05-15 ENCOUNTER — Ambulatory Visit: Payer: Medicare Other | Admitting: Physical Therapy

## 2018-05-19 ENCOUNTER — Ambulatory Visit: Payer: Medicare Other | Admitting: Physical Therapy

## 2018-05-20 ENCOUNTER — Ambulatory Visit: Payer: Medicare Other | Admitting: Family Medicine

## 2018-05-22 ENCOUNTER — Ambulatory Visit: Payer: Medicare Other | Attending: Family Medicine | Admitting: Physical Therapy

## 2018-05-22 ENCOUNTER — Encounter: Payer: Self-pay | Admitting: Physical Therapy

## 2018-05-22 DIAGNOSIS — M5441 Lumbago with sciatica, right side: Secondary | ICD-10-CM | POA: Insufficient documentation

## 2018-05-22 DIAGNOSIS — M5442 Lumbago with sciatica, left side: Secondary | ICD-10-CM | POA: Insufficient documentation

## 2018-05-22 DIAGNOSIS — G8929 Other chronic pain: Secondary | ICD-10-CM | POA: Insufficient documentation
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
## 2018-05-26 ENCOUNTER — Encounter: Payer: Self-pay | Admitting: Family Medicine

## 2018-05-26 ENCOUNTER — Ambulatory Visit (INDEPENDENT_AMBULATORY_CARE_PROVIDER_SITE_OTHER): Payer: Medicare Other | Admitting: Family Medicine

## 2018-05-26 ENCOUNTER — Ambulatory Visit: Payer: Medicare Other | Admitting: Physical Therapy

## 2018-05-26 ENCOUNTER — Encounter: Payer: Self-pay | Admitting: Physical Therapy

## 2018-05-26 VITALS — BP 114/62 | HR 98 | Temp 98.6°F | Resp 14 | Ht 67.0 in | Wt 222.1 lb

## 2018-05-26 DIAGNOSIS — M5441 Lumbago with sciatica, right side: Principal | ICD-10-CM

## 2018-05-26 DIAGNOSIS — Z0000 Encounter for general adult medical examination without abnormal findings: Secondary | ICD-10-CM

## 2018-05-26 DIAGNOSIS — G8929 Other chronic pain: Secondary | ICD-10-CM

## 2018-05-26 DIAGNOSIS — Z124 Encounter for screening for malignant neoplasm of cervix: Secondary | ICD-10-CM | POA: Diagnosis not present

## 2018-05-26 DIAGNOSIS — M5442 Lumbago with sciatica, left side: Secondary | ICD-10-CM | POA: Diagnosis not present
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
## 2018-05-26 NOTE — Assessment & Plan Note (Signed)
USPSTF grade A and B recommendations reviewed with patient; age-appropriate recommendations, preventive care, screening tests, etc discussed and encouraged; healthy living encouraged; see AVS for patient education given to patient  

## 2018-05-26 NOTE — Progress Notes (Signed)
Patient ID: Elizabeth [name], female   DOB: 1966-08-02, 52 y.o.   MRN: [id number]   Subjective:   Elizabeth [name] is a 52 y.o. female here for a complete physical exam  Interim issues since last visit: going to PT; has appt today  USPSTF grade A and B recommendations Depression:  Depression screen [facility] 2/9 05/26/2018 04/16/2018 03/27/2018 01/05/2018 10/15/2017  Decreased Interest 0 0 0 0 0  Down, Depressed, Hopeless 0 0 0 0 0  PHQ - 2 Score 0 0 0 0 0  Altered sleeping - - 0 - -  Tired, decreased energy - - 0 - -  Change in appetite - - 0 - -  Feeling bad or failure about yourself  - - 0 - -  Trouble concentrating - - 0 - -  Moving slowly or fidgety/restless - - 0 - -  Suicidal thoughts - - 0 - -  PHQ-9 Score - - 0 - -  Difficult doing work/chores - - Not difficult at all - -   Hypertension: BP Readings from Last 3 Encounters:  05/26/18 114/62  04/16/18 118/82  03/27/18 108/70   Obesity: Wt Readings from Last 3 Encounters:  05/26/18 222 lb 1.6 oz (100.7 kg)  04/16/18 219 lb 12.8 oz (99.7 kg)  03/27/18 218 lb (98.9 kg)   BMI Readings from Last 3 Encounters:  05/26/18 34.79 kg/m  04/16/18 34.43 kg/m  03/27/18 34.14 kg/m     Skin cancer: nothing worrisome Lung cancer:  no Breast cancer: getting additional films; July 16th; doing SBE months Colorectal cancer: due 2021 Cervical cancer screening: today BRCA gene screening: family hx of breast and/or ovarian cancer and/or metastatic prostate cancer? No HIV, hep B, hep C: already tested STD testing and prevention (chl/gon/syphilis): not interested Intimate partner violence: no abuse Contraception: n/a Osteoporosis: no Fall prevention/vitamin D: discussed Immunizations: UTD Diet: good eater sometimes Exercise: getting some exercise, try to increase Alcohol: no Tobacco use: no  AAA: n/a Aspirin: taking, no bleeding Glucose:  Glucose, Bld  Date Value Ref Range Status  01/05/2018 142 (H) 65 - 99 mg/dL Final    Comment:     .            Fasting reference interval . For someone without known diabetes, a glucose value >125 mg/dL indicates that they may have diabetes and this should be confirmed with a follow-up test. .   12/05/2017 248 (H) 65 - 99 mg/dL Final  04/01/2017 121 (H) 65 - 99 mg/dL Final   Glucose-Capillary  Date Value Ref Range Status  12/05/2017 249 (H) 65 - 99 mg/dL Final  02/13/2016 99 65 - 99 mg/dL Final  02/12/2016 195 (H) 65 - 99 mg/dL Final   Lipids:  Lab Results  Component Value Date   CHOL 126 01/05/2018   CHOL 118 01/01/2017   CHOL 189 09/09/2016   Lab Results  Component Value Date   HDL 51 01/05/2018   HDL 38 (L) 01/01/2017   HDL 40 (L) 09/09/2016   Lab Results  Component Value Date   LDLCALC 55 01/05/2018   LDLCALC 51 01/01/2017   LDLCALC 119 09/09/2016   Lab Results  Component Value Date   TRIG 110 01/05/2018   TRIG 145 01/01/2017   TRIG 152 (H) 09/09/2016   Lab Results  Component Value Date   CHOLHDL 2.5 01/05/2018   CHOLHDL 3.1 01/01/2017   CHOLHDL 4.7 09/09/2016   No results found for: LDLDIRECT   Past Medical History:  Diagnosis  Date  . Bipolar 1 disorder ([place])   . Depression   . Diabetes mellitus without complication ([place])   . Diverticulosis   . GERD (gastroesophageal reflux disease)   . Heart murmur    followed by PCP  . Hepatitis    B - "not active"  . Hypertension goal BP (blood pressure) < 130/80 01/17/2016  . Insomnia   . Irritable bowel syndrome   . Motion sickness    boats  . Obesity (BMI 30-39.9) 07/03/2015  . RA (rheumatoid arthritis) ([place])   . Sciatica    Past Surgical History:  Procedure Laterality Date  . BOWEL RESECTION    . CHOLECYSTECTOMY  1997   [place], [place]  . COLONOSCOPY WITH PROPOFOL N/A 09/07/2015   Procedure: COLONOSCOPY WITH PROPOFOL;  Surgeon: Lucilla Lame, MD;  Location: [place];  Service: Endoscopy;  Laterality: N/A;  . ESOPHAGOGASTRODUODENOSCOPY (EGD) WITH ESOPHAGEAL DILATION    .  ESOPHAGOGASTRODUODENOSCOPY (EGD) WITH PROPOFOL N/A 09/07/2015   Procedure: ESOPHAGOGASTRODUODENOSCOPY (EGD) WITH PROPOFOL;  Surgeon: Lucilla Lame, MD;  Location: [place];  Service: Endoscopy;  Laterality: N/A;  . PARTIAL COLECTOMY N/A 01/24/2016   Procedure: SIGMOID COLECTOMY;  Surgeon: Florene Glen, MD;  Location: [facility];  Service: General;  Laterality: N/A;  . POLYPECTOMY N/A 09/07/2015   Procedure: POLYPECTOMY;  Surgeon: Lucilla Lame, MD;  Location: [facility];  Service: Endoscopy;  Laterality: N/A;  . TUBAL LIGATION     Family History  Problem Relation Age of Onset  . Hypertension Mother   . Diabetes Mother   . Arthritis/Rheumatoid Mother   . Hyperlipidemia Mother   . Alcohol abuse Father   . Esophageal varices Father   . Diverticulosis Maternal Aunt   . Diverticulosis Maternal Grandmother   . Diabetes Maternal Grandmother   . Heart disease Maternal Grandmother   . Dementia Maternal Grandmother   . Diabetes Brother   . Kidney disease Brother   . Aneurysm Maternal Grandfather   . Diabetes Paternal Grandmother   . Healthy Brother   . Depression Daughter        bipolar disorder  . Depression Daughter        bipolar disorder  . Breast cancer Neg Hx    Social History   Tobacco Use  . Smoking status: Former Smoker    Packs/day: 1.00    Years: 1.00    Pack years: 1.00    Types: Cigarettes    Last attempt to quit: 05/06/2015    Years since quitting: 3.0  . Smokeless tobacco: Never Used  . Tobacco comment: smoking cessation materials not required  Substance Use Topics  . Alcohol use: No    Alcohol/week: 0.0 oz  . Drug use: No   Review of Systems  Objective:   Vitals:   05/26/18 1542  BP: 114/62  Pulse: 98  Resp: 14  Temp: 98.6 F (37 C)  TempSrc: Oral  SpO2: 97%  Weight: 222 lb 1.6 oz (100.7 kg)  Height: '5\' 7"'  (1.702 m)   Body mass index is 34.79 kg/m. Wt Readings from Last 3 Encounters:  05/26/18 222 lb 1.6 oz (100.7 kg)  04/16/18  219 lb 12.8 oz (99.7 kg)  03/27/18 218 lb (98.9 kg)   Physical Exam  Constitutional: She appears well-developed and well-nourished.  HENT:  Head: Normocephalic and atraumatic.  Eyes: Conjunctivae and EOM are normal. Right eye exhibits no hordeolum. Left eye exhibits no hordeolum. No scleral icterus.  Neck: Carotid bruit is not present. No thyromegaly  present.  Cardiovascular: Normal rate, regular rhythm, S1 normal, S2 normal and normal heart sounds.  No extrasystoles are present.  Pulmonary/Chest: Effort normal and breath sounds normal. No respiratory distress.  Patient declined breast exam  Abdominal: Soft. Normal appearance and bowel sounds are normal. She exhibits no distension, no abdominal bruit, no pulsatile midline mass and no mass. There is no hepatosplenomegaly. There is no tenderness. No hernia.  Genitourinary: Uterus normal. Pelvic exam was performed with patient prone. There is no rash or lesion on the right labia. There is no rash or lesion on the left labia. Cervix exhibits no motion tenderness. Right adnexum displays no mass, no tenderness and no fullness. Left adnexum displays no mass, no tenderness and no fullness.  Musculoskeletal: Normal range of motion. She exhibits no edema.  Lymphadenopathy:       Head (right side): No submandibular adenopathy present.       Head (left side): No submandibular adenopathy present.    She has no cervical adenopathy.    She has no axillary adenopathy.  Neurological: She is alert. She displays no tremor. No cranial nerve deficit. She exhibits normal muscle tone. Gait normal.  Skin: Skin is warm and dry. No bruising and no ecchymosis noted. No cyanosis. No pallor.  Psychiatric: Her speech is normal and behavior is normal. Thought content normal. Her mood appears not anxious. She does not exhibit a depressed mood.   Diabetic Foot Form - Detailed   Diabetic Foot Exam - detailed Diabetic Foot exam was performed with the following findings:  Yes  05/26/2018  4:36 PM  Visual Foot Exam completed.:  Yes  Pulse Foot Exam completed.:  Yes  Right Dorsalis Pedis:  Present Left Dorsalis Pedis:  Present  Sensory Foot Exam Completed.:  Yes Semmes-Weinstein Monofilament Test R Site 1-Great Toe:  Pos L Site 1-Great Toe:  Pos         Assessment/Plan:   Problem List Items Addressed This Visit      Other   Screening for cervical cancer   Relevant Orders   Pap IG and HPV (high risk) DNA detection   Preventative health care - Primary    USPSTF grade A and B recommendations reviewed with patient; age-appropriate recommendations, preventive care, screening tests, etc discussed and encouraged; healthy living encouraged; see AVS for patient education given to patient           No orders of the defined types were placed in this encounter.  No orders of the defined types were placed in this encounter.   Follow up plan: Return in about 1 year (around 05/27/2019) for complete physical.  An After Visit Summary was printed and given to the patient.

## 2018-05-26 NOTE — Patient Instructions (Signed)

## 2018-05-28 LAB — PAP IG AND HPV HIGH-RISK: HPV DNA High Risk: NOT DETECTED

## 2018-05-29 ENCOUNTER — Encounter: Payer: Medicare Other | Admitting: Physical Therapy

## 2018-05-29 ENCOUNTER — Telehealth: Payer: Self-pay | Admitting: Family Medicine

## 2018-05-29 NOTE — Telephone Encounter (Signed)
-----   Message from Arnetha Courser, MD sent at 12/16/2017 10:25 AM EST ----- Regarding: due for 6 month breast imaging soon Dec 16, 2017: RECOMMENDATION: Diagnostic mammography of the right breast with ultrasound in 6 months.

## 2018-05-29 NOTE — Telephone Encounter (Signed)
Imaging already scheduled for 06/23/18

## 2018-06-02 ENCOUNTER — Encounter: Payer: Self-pay | Admitting: Physical Therapy

## 2018-06-02 ENCOUNTER — Ambulatory Visit: Payer: Medicare Other | Admitting: Physical Therapy

## 2018-06-02 DIAGNOSIS — M5442 Lumbago with sciatica, left side: Secondary | ICD-10-CM | POA: Diagnosis not present

## 2018-06-02 DIAGNOSIS — M5441 Lumbago with sciatica, right side: Principal | ICD-10-CM

## 2018-06-02 DIAGNOSIS — G8929 Other chronic pain: Secondary | ICD-10-CM
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
## 2018-06-09 ENCOUNTER — Ambulatory Visit: Payer: Medicare Other | Attending: Family Medicine | Admitting: Physical Therapy

## 2018-06-09 DIAGNOSIS — M5442 Lumbago with sciatica, left side: Secondary | ICD-10-CM | POA: Insufficient documentation

## 2018-06-09 DIAGNOSIS — G8929 Other chronic pain: Secondary | ICD-10-CM | POA: Insufficient documentation

## 2018-06-09 DIAGNOSIS — M5441 Lumbago with sciatica, right side: Secondary | ICD-10-CM | POA: Insufficient documentation

## 2018-06-16 ENCOUNTER — Encounter: Payer: Self-pay | Admitting: Physical Therapy

## 2018-06-16 ENCOUNTER — Ambulatory Visit: Payer: Medicare Other | Admitting: Physical Therapy

## 2018-06-16 DIAGNOSIS — G8929 Other chronic pain: Secondary | ICD-10-CM | POA: Diagnosis not present

## 2018-06-16 DIAGNOSIS — M5441 Lumbago with sciatica, right side: Principal | ICD-10-CM

## 2018-06-16 DIAGNOSIS — M5442 Lumbago with sciatica, left side: Principal | ICD-10-CM
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
## 2018-06-19 ENCOUNTER — Ambulatory Visit: Payer: Medicare Other | Admitting: Physical Therapy

## 2018-06-23 ENCOUNTER — Ambulatory Visit: Payer: Medicare Other | Admitting: Physical Therapy

## 2018-06-23 ENCOUNTER — Encounter: Payer: Self-pay | Admitting: Physical Therapy

## 2018-06-23 ENCOUNTER — Ambulatory Visit
Admission: RE | Admit: 2018-06-23 | Discharge: 2018-06-23 | Disposition: A | Discharge status: Home / Self Care | Payer: Medicare Other | Source: Ambulatory Visit | Attending: Family Medicine | Admitting: Family Medicine

## 2018-06-23 DIAGNOSIS — G8929 Other chronic pain: Secondary | ICD-10-CM | POA: Diagnosis not present

## 2018-06-23 DIAGNOSIS — M5442 Lumbago with sciatica, left side: Principal | ICD-10-CM

## 2018-06-23 DIAGNOSIS — R928 Other abnormal and inconclusive findings on diagnostic imaging of breast: Secondary | ICD-10-CM

## 2018-06-23 DIAGNOSIS — M5441 Lumbago with sciatica, right side: Secondary | ICD-10-CM | POA: Diagnosis not present

## 2018-06-23 DIAGNOSIS — N6311 Unspecified lump in the right breast, upper outer quadrant: Secondary | ICD-10-CM | POA: Diagnosis not present
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
## 2018-06-26 ENCOUNTER — Encounter: Payer: Self-pay | Admitting: Physical Therapy

## 2018-06-26 ENCOUNTER — Ambulatory Visit: Payer: Medicare Other | Admitting: Physical Therapy

## 2018-06-26 DIAGNOSIS — M5441 Lumbago with sciatica, right side: Secondary | ICD-10-CM | POA: Diagnosis not present

## 2018-06-26 DIAGNOSIS — M5442 Lumbago with sciatica, left side: Principal | ICD-10-CM

## 2018-06-26 DIAGNOSIS — G8929 Other chronic pain: Secondary | ICD-10-CM | POA: Diagnosis not present
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
## 2018-06-29 ENCOUNTER — Encounter: Payer: Self-pay | Admitting: Family Medicine

## 2018-06-29 ENCOUNTER — Ambulatory Visit (INDEPENDENT_AMBULATORY_CARE_PROVIDER_SITE_OTHER): Payer: Medicare Other | Admitting: Family Medicine

## 2018-06-29 VITALS — BP 114/64 | HR 90 | Temp 98.0°F | Resp 12 | Ht 67.0 in | Wt 222.4 lb

## 2018-06-29 DIAGNOSIS — Z5181 Encounter for therapeutic drug level monitoring: Secondary | ICD-10-CM | POA: Diagnosis not present

## 2018-06-29 DIAGNOSIS — M05741 Rheumatoid arthritis with rheumatoid factor of right hand without organ or systems involvement: Secondary | ICD-10-CM | POA: Diagnosis not present

## 2018-06-29 DIAGNOSIS — E785 Hyperlipidemia, unspecified: Secondary | ICD-10-CM | POA: Diagnosis not present

## 2018-06-29 DIAGNOSIS — M05742 Rheumatoid arthritis with rheumatoid factor of left hand without organ or systems involvement: Secondary | ICD-10-CM | POA: Diagnosis not present

## 2018-06-29 DIAGNOSIS — E119 Type 2 diabetes mellitus without complications: Secondary | ICD-10-CM

## 2018-06-29 DIAGNOSIS — I10 Essential (primary) hypertension: Secondary | ICD-10-CM

## 2018-06-29 MED ORDER — GLUCOSE BLOOD VI STRP: ORAL_STRIP | 1 refills | Status: DC

## 2018-06-29 NOTE — Patient Instructions (Addendum)
If it comes from a cow or a pig, it probably has saturated fat in it Try to limit saturated fats in your diet (bologna, hot dogs, barbeque, cheeseburgers, hamburgers, steak, bacon, sausage, cheese, etc.) and get more fresh fruits, vegetables, and whole grains Check out the information at familydoctor.org entitled "Nutrition for Weight Loss: What You Need to Know about Fad Diets" Try to lose between 1-2 pounds per week by taking in fewer calories and burning off more calories You can succeed by limiting portions, limiting foods dense in calories and fat, becoming more active, and drinking 8 glasses of water a day (64 ounces) Don't skip meals, especially breakfast, as skipping meals may alter your metabolism Do not use over-the-counter weight loss pills or gimmicks that claim rapid weight loss A healthy BMI (or body mass index) is between 18.5 and 24.9 You can calculate your ideal BMI at the [employer] website ClubMonetize.fr  Obesity, Adult Obesity is the condition of having too much total body fat. Being overweight or obese means that your weight is greater than what is considered healthy for your body size. Obesity is determined by a measurement called BMI. BMI is an estimate of body fat and is calculated from height and weight. For adults, a BMI of 30 or higher is considered obese. Obesity can eventually lead to other health concerns and major illnesses, including:  Stroke.  Coronary artery disease (CAD).  Type 2 diabetes.  Some types of cancer, including cancers of the colon, breast, uterus, and gallbladder.  Osteoarthritis.  High blood pressure (hypertension).  High cholesterol.  Sleep apnea.  Gallbladder stones.  Infertility problems.  What are the causes? The main cause of obesity is taking in (consuming) more calories than your body uses for energy. Other factors that contribute to this condition may include:  Being born with  genes that make you more likely to become obese.  Having a medical condition that causes obesity. These conditions include: ? Hypothyroidism. ? Polycystic ovarian syndrome (PCOS). ? Binge-eating disorder. ? Cushing syndrome.  Taking certain medicines, such as steroids, antidepressants, and seizure medicines.  Not being physically active (sedentary lifestyle).  Living where there are limited places to exercise safely or buy healthy foods.  Not getting enough sleep.  What increases the risk? The following factors may increase your risk of this condition:  Having a family history of obesity.  Being a woman of African-American descent.  Being a man of Hispanic descent.  What are the signs or symptoms? Having excessive body fat is the main symptom of this condition. How is this diagnosed? This condition may be diagnosed based on:  Your symptoms.  Your medical history.  A physical exam. Your health care provider may measure: ? Your BMI. If you are an adult with a BMI between 25 and less than 30, you are considered overweight. If you are an adult with a BMI of 30 or higher, you are considered obese. ? The distances around your hips and your waist (circumferences). These may be compared to each other to help diagnose your condition. ? Your skinfold thickness. Your health care provider may gently pinch a fold of your skin and measure it.  How is this treated? Treatment for this condition often includes changing your lifestyle. Treatment may include some or all of the following:  Dietary changes. Work with your health care provider and a dietitian to set a weight-loss goal that is healthy and reasonable for you. Dietary changes may include eating: ? Smaller portions. A  portion size is the amount of a particular food that is healthy for you to eat at one time. This varies from person to person. ? Low-calorie or low-fat options. ? More whole grains, fruits, and vegetables.  Regular  physical activity. This may include aerobic activity (cardio) and strength training.  Medicine to help you lose weight. Your health care provider may prescribe medicine if you are unable to lose 1 pound a week after 6 weeks of eating more healthily and doing more physical activity.  Surgery. Surgical options may include gastric banding and gastric bypass. Surgery may be done if: ? Other treatments have not helped to improve your condition. ? You have a BMI of 40 or higher. ? You have life-threatening health problems related to obesity.  Follow these instructions at home:  Eating and drinking   Follow recommendations from your health care provider about what you eat and drink. Your health care provider may advise you to: ? Limit fast foods, sweets, and processed snack foods. ? Choose low-fat options, such as low-fat milk instead of whole milk. ? Eat 5 or more servings of fruits or vegetables every day. ? Eat at home more often. This gives you more control over what you eat. ? Choose healthy foods when you eat out. ? Learn what a healthy portion size is. ? Keep low-fat snacks on hand. ? Avoid sugary drinks, such as soda, fruit juice, iced tea sweetened with sugar, and flavored milk. ? Eat a healthy breakfast.  Drink enough water to keep your urine clear or pale yellow.  Do not go without eating for long periods of time (do not fast) or follow a fad diet. Fasting and fad diets can be unhealthy and even dangerous. Physical Activity  Exercise regularly, as told by your health care provider. Ask your health care provider what types of exercise are safe for you and how often you should exercise.  Warm up and stretch before being active.  Cool down and stretch after being active.  Rest between periods of activity. Lifestyle  Limit the time that you spend in front of your TV, computer, or video game system.  Find ways to reward yourself that do not involve food.  Limit alcohol  intake to no more than 1 drink a day for nonpregnant women and 2 drinks a day for men. One drink equals 12 oz of beer, 5 oz of wine, or 1 oz of hard liquor. General instructions  Keep a weight loss journal to keep track of the food you eat and how much you exercise you get.  Take over-the-counter and prescription medicines only as told by your health care provider.  Take vitamins and supplements only as told by your health care provider.  Consider joining a support group. Your health care provider may be able to recommend a support group.  Keep all follow-up visits as told by your health care provider. This is important. Contact a health care provider if:  You are unable to meet your weight loss goal after 6 weeks of dietary and lifestyle changes. This information is not intended to replace advice given to you by your health care provider. Make sure you discuss any questions you have with your health care provider. Document Released: 01/02/2005 Document Revised: 04/29/2016 Document Reviewed: 09/13/2015 Elsevier Interactive Patient Education  2018 [place].  Preventing Unhealthy [employer], Adult Staying at a healthy weight is important. When fat builds up in your body, you may become overweight or obese. These conditions  put you at greater risk for developing certain health problems, such as heart disease, diabetes, sleeping problems, joint problems, and some cancers. Unhealthy weight gain is often the result of making unhealthy choices in what you eat. It is also a result of not getting enough exercise. You can make changes to your lifestyle to prevent obesity and stay as healthy as possible. What nutrition changes can be made? To maintain a healthy weight and prevent obesity:  Eat only as much as your body needs. To do this: ? Pay attention to signs that you are hungry or full. Stop eating as soon as you feel full. ? If you feel hungry, try drinking water first. Drink enough water  so your urine is clear or pale yellow. ? Eat smaller portions. ? Look at serving sizes on food labels. Most foods contain more than one serving per container. ? Eat the recommended amount of calories for your gender and activity level. While most active people should eat around 2,000 calories per day, if you are trying to lose weight or are not very active, you main need to eat less calories. Talk to your health care provider or dietitian about how many calories you should eat each day.  Choose healthy foods, such as: ? Fruits and vegetables. Try to fill at least half of your plate at each meal with fruits and vegetables. ? Whole grains, such as whole wheat bread, brown rice, and quinoa. ? Lean meats, such as chicken or fish. ? Other healthy proteins, such as beans, eggs, or tofu. ? Healthy fats, such as nuts, seeds, fatty fish, and olive oil. ? Low-fat or fat-free dairy.  Check food labels and avoid food and drinks that: ? Are high in calories. ? Have added sugar. ? Are high in sodium. ? Have saturated fats or trans fats.  Limit how much you eat of the following foods: ? Prepackaged meals. ? Fast food. ? Fried foods. ? Processed meat, such as bacon, sausage, and deli meats. ? Fatty cuts of red meat and poultry with skin.  Cook foods in healthier ways, such as by baking, broiling, or grilling.  When grocery shopping, try to shop around the outside of the store. This helps you buy mostly fresh foods and avoid canned and prepackaged foods.  What lifestyle changes can be made?  Exercise at least 30 minutes 5 or more days each week. Exercising includes brisk walking, yard work, biking, running, swimming, and team sports like basketball and soccer. Ask your health care provider which exercises are safe for you.  Do not use any products that contain nicotine or tobacco, such as cigarettes and e-cigarettes. If you need help quitting, ask your health care provider.  Limit alcohol intake  to no more than 1 drink a day for nonpregnant women and 2 drinks a day for men. One drink equals 12 oz of beer, 5 oz of wine, or 1 oz of hard liquor.  Try to get 7-9 hours of sleep each night. What other changes can be made?  Keep a food and activity journal to keep track of: ? What you ate and how many calories you had. Remember to count sauces, dressings, and side dishes. ? Whether you were active, and what exercises you did. ? Your calorie, weight, and activity goals.  Check your weight regularly. Track any changes. If you notice you have gained weight, make changes to your diet or activity routine.  Avoid taking weight-loss medicines or supplements. Talk to your health  care provider before starting any new medicine or supplement.  Talk to your health care provider before trying any new diet or exercise plan. Why are these changes important? Eating healthy, staying active, and having healthy habits not only help prevent obesity, they also:  Help you to manage stress and emotions.  Help you to connect with friends and family.  Improve your self-esteem.  Improve your sleep.  Prevent long-term health problems.  What can happen if changes are not made? Being obese or overweight can cause you to develop joint or bone problems, which can make it hard for you to stay active or do activities you enjoy. Being obese or overweight also puts stress on your heart and lungs and can lead to health problems like diabetes, heart disease, and some cancers. Where to find more information: Talk with your health care provider or a dietitian about healthy eating and healthy lifestyle choices. You may also find other information through these resources:  U.S. Department of Agriculture MyPlate: FormerBoss.no  American Heart Association: www.heart.org  Centers for Disease Control and Prevention: http://www.wolf.info/  Summary  Staying at a healthy weight is important. It helps prevent certain  diseases and health problems, such as heart disease, diabetes, joint problems, sleep disorders, and some cancers.  Being obese or overweight can cause you to develop joint or bone problems, which can make it hard for you to stay active or do activities you enjoy.  You can prevent unhealthy weight gain by eating a healthy diet, exercising regularly, not smoking, limiting alcohol, and getting enough sleep.  Talk with your health care provider or a dietitian for guidance about healthy eating and healthy lifestyle choices. This information is not intended to replace advice given to you by your health care provider. Make sure you discuss any questions you have with your health care provider. Document Released: 11/26/2016 Document Revised: 01/01/2017 Document Reviewed: 01/01/2017 Elsevier Interactive Patient Education  [employer].

## 2018-06-29 NOTE — Assessment & Plan Note (Signed)
Foot exam by MD; eye exam UTD; check labs today; avoid sugary drinks

## 2018-06-29 NOTE — Assessment & Plan Note (Signed)
Followed at [facility]

## 2018-06-29 NOTE — Assessment & Plan Note (Signed)
Check liver and kidneys 

## 2018-06-29 NOTE — Assessment & Plan Note (Signed)
Controlled; try DASH guidelines

## 2018-06-29 NOTE — Assessment & Plan Note (Signed)
Check lipids today; cut back on saturated fats

## 2018-06-29 NOTE — Progress Notes (Signed)
BP 114/64   Pulse 90   Temp 98 F (36.7 C) (Oral)   Resp 12   Ht 5\' 7"  (1.702 m)   Wt 222 lb 6.4 oz (100.9 kg)   LMP 07/09/2014   SpO2 96%   BMI 34.83 kg/m    Subjective:    Patient ID: Elizabeth [name], female    DOB: [birth year], 52 y.o.   MRN: [id number]  HPI: Elizabeth [name] is a 52 y.o. female  Chief Complaint  Patient presents with  . Follow-up    HPI Patient is here for f/u Type 2 diabetes; chekcing FSBS once a day, not twice a day; last month, the highest FSBS was 126; lowest over the last month, 104; does have dry mouth sometimes; Jan 2019 last eye exam; eyes were okay; no problems with feet; offered offered diabetic educator but she declined Lab Results  Component Value Date   HGBA1C 6.7 (H) 01/05/2018    High cholesterol; add some bologna and cheese this morning; anniversary dinner yesterday Lab Results  Component Value Date   CHOL 126 01/05/2018   HDL 51 01/05/2018   LDLCALC 55 01/05/2018   TRIG 110 01/05/2018   CHOLHDL 2.5 01/05/2018   HTN; well-controlled; not adding much salt  Right breast checked and it's the same; cyst and runs in the family; breast exam monthly  Obesity; limited activity with her back  RA; sees rheum at [place]; still having issues with hands  Depression screen [facility] 2/9 06/29/2018 05/26/2018 04/16/2018 03/27/2018 01/05/2018  Decreased Interest 0 0 0 0 0  Down, Depressed, Hopeless 0 0 0 0 0  PHQ - 2 Score 0 0 0 0 0  Altered sleeping - - - 0 -  Tired, decreased energy - - - 0 -  Change in appetite - - - 0 -  Feeling bad or failure about yourself  - - - 0 -  Trouble concentrating - - - 0 -  Moving slowly or fidgety/restless - - - 0 -  Suicidal thoughts - - - 0 -  PHQ-9 Score - - - 0 -  Difficult doing work/chores - - - Not difficult at all -    Relevant past medical, surgical, family and social history reviewed Past Medical History:  Diagnosis Date  . Bipolar 1 disorder ([place])   . Depression   . Diabetes mellitus without  complication ([place])   . Diverticulosis   . GERD (gastroesophageal reflux disease)   . Heart murmur    followed by PCP  . Hepatitis    B - "not active"  . Hypertension goal BP (blood pressure) < 130/80 01/17/2016  . Insomnia   . Irritable bowel syndrome   . Motion sickness    boats  . Obesity (BMI 30-39.9) 07/03/2015  . RA (rheumatoid arthritis) ([place])   . Sciatica    Past Surgical History:  Procedure Laterality Date  . BOWEL RESECTION    . CHOLECYSTECTOMY  1997   [place], [place]  . COLONOSCOPY WITH PROPOFOL N/A 09/07/2015   Procedure: COLONOSCOPY WITH PROPOFOL;  Surgeon: Lucilla Lame, MD;  Location: [facility];  Service: Endoscopy;  Laterality: N/A;  . ESOPHAGOGASTRODUODENOSCOPY (EGD) WITH ESOPHAGEAL DILATION    . ESOPHAGOGASTRODUODENOSCOPY (EGD) WITH PROPOFOL N/A 09/07/2015   Procedure: ESOPHAGOGASTRODUODENOSCOPY (EGD) WITH PROPOFOL;  Surgeon: Lucilla Lame, MD;  Location: [facility];  Service: Endoscopy;  Laterality: N/A;  . PARTIAL COLECTOMY N/A 01/24/2016   Procedure: SIGMOID COLECTOMY;  Surgeon: Florene Glen, MD;  Location: [facility]  [facility];  Service: General;  Laterality: N/A;  . POLYPECTOMY N/A 09/07/2015   Procedure: POLYPECTOMY;  Surgeon: Lucilla Lame, MD;  Location: [place];  Service: Endoscopy;  Laterality: N/A;  . TUBAL LIGATION     Family History  Problem Relation Age of Onset  . Hypertension Mother   . Diabetes Mother   . Arthritis/Rheumatoid Mother   . Hyperlipidemia Mother   . Alcohol abuse Father   . Esophageal varices Father   . Diverticulosis Maternal Aunt   . Diverticulosis Maternal Grandmother   . Diabetes Maternal Grandmother   . Heart disease Maternal Grandmother   . Dementia Maternal Grandmother   . Diabetes Brother   . Kidney disease Brother   . Aneurysm Maternal Grandfather   . Diabetes Paternal Grandmother   . Healthy Brother   . Depression Daughter        bipolar disorder  . Depression Daughter        bipolar disorder  .  Breast cancer Neg Hx    Social History   Tobacco Use  . Smoking status: Former Smoker    Packs/day: 1.00    Years: 1.00    Pack years: 1.00    Types: Cigarettes    Last attempt to quit: 05/06/2015    Years since quitting: 3.1  . Smokeless tobacco: Never Used  . Tobacco comment: smoking cessation materials not required  Substance Use Topics  . Alcohol use: No    Alcohol/week: 0.0 oz  . Drug use: No    Interim medical history since last visit reviewed. Allergies and medications reviewed  Review of Systems Per HPI unless specifically indicated above     Objective:    BP 114/64   Pulse 90   Temp 98 F (36.7 C) (Oral)   Resp 12   Ht 5\' 7"  (1.702 m)   Wt 222 lb 6.4 oz (100.9 kg)   LMP 07/09/2014   SpO2 96%   BMI 34.83 kg/m   Wt Readings from Last 3 Encounters:  06/29/18 222 lb 6.4 oz (100.9 kg)  05/26/18 222 lb 1.6 oz (100.7 kg)  04/16/18 219 lb 12.8 oz (99.7 kg)    Physical Exam  Constitutional: She appears well-developed and well-nourished. No distress.  HENT:  Head: Normocephalic and atraumatic.  Eyes: EOM are normal. No scleral icterus.  Neck: No thyromegaly present.  Cardiovascular: Normal rate, regular rhythm and normal heart sounds.  No murmur heard. Pulmonary/Chest: Effort normal and breath sounds normal. No respiratory distress. She has no wheezes.  Abdominal: Soft. Bowel sounds are normal. She exhibits no distension.  Musculoskeletal: Normal range of motion. She exhibits no edema.  Neurological: She is alert. She exhibits normal muscle tone.  Skin: Skin is warm and dry. She is not diaphoretic. No pallor.  Psychiatric: She has a normal mood and affect. Her behavior is normal. Judgment and thought content normal.   Diabetic Foot Form - Detailed   Diabetic Foot Exam - detailed Diabetic Foot exam was performed with the following findings:  Yes 06/29/2018 10:04 AM  Visual Foot Exam completed.:  Yes  Pulse Foot Exam completed.:  Yes  Right Dorsalis Pedis:   Present Left Dorsalis Pedis:  Present  Sensory Foot Exam Completed.:  Yes Semmes-Weinstein Monofilament Test R Site 1-Great Toe:  Pos L Site 1-Great Toe:  Pos          Assessment & Plan:   Problem List Items Addressed This Visit      Cardiovascular and Mediastinum   Hypertension  goal BP (blood pressure) < 130/80 (Chronic)    Controlled; try DASH guidelines        Endocrine   Diabetes mellitus type 2, controlled, without complications ([place]) - Primary (Chronic)    Foot exam by MD; eye exam UTD; check labs today; avoid sugary drinks        Musculoskeletal and Integument   Rheumatoid arthritis involving both hands ([facility]) (Chronic)    Followed at [facility]        Other   Medication monitoring encounter    Check liver and kidneys      Hyperlipidemia LDL goal <100    Check lipids today; cut back on saturated fats          Follow up plan: No follow-ups on file.  An after-visit summary was printed and given to the patient at [facility].  Please see the patient instructions which may contain other information and recommendations beyond what is mentioned above in the assessment and plan.  Meds ordered this encounter  Medications  . glucose blood (ACCU-CHEK SMARTVIEW) test strip    Sig: Check fingerstick blood sugars once a day; E11.65, LON 99 months    Dispense:  100 each    Refill:  1    No orders of the defined types were placed in this encounter.   Checking labs today, released from order tab

## 2018-06-30 ENCOUNTER — Encounter: Payer: Self-pay | Admitting: Physical Therapy

## 2018-06-30 ENCOUNTER — Ambulatory Visit: Payer: Medicare Other | Admitting: Physical Therapy

## 2018-06-30 DIAGNOSIS — M5441 Lumbago with sciatica, right side: Secondary | ICD-10-CM | POA: Diagnosis not present

## 2018-06-30 DIAGNOSIS — G8929 Other chronic pain: Secondary | ICD-10-CM | POA: Diagnosis not present

## 2018-06-30 DIAGNOSIS — M5442 Lumbago with sciatica, left side: Secondary | ICD-10-CM | POA: Diagnosis not present

## 2018-06-30 LAB — COMPLETE METABOLIC PANEL WITH GFR
AG RATIO: 1.5 (calc) (ref 1.0–2.5)
ALT: 22 U/L (ref 6–29)
AST: 18 U/L (ref 10–35)
Albumin: 4 g/dL (ref 3.6–5.1)
Alkaline phosphatase (APISO): 97 U/L (ref 33–130)
BILIRUBIN TOTAL: 0.4 mg/dL (ref 0.2–1.2)
BUN: 11 mg/dL (ref 7–25)
CALCIUM: 9.8 mg/dL (ref 8.6–10.4)
CHLORIDE: 104 mmol/L (ref 98–110)
CO2: 28 mmol/L (ref 20–32)
Creat: 0.78 mg/dL (ref 0.50–1.05)
GFR, EST AFRICAN AMERICAN: 102 mL/min/{1.73_m2} (ref >=60)
GFR, EST NON AFRICAN AMERICAN: 88 mL/min/{1.73_m2} (ref >=60)
GLOBULIN: 2.7 g/dL (ref 1.9–3.7)
Glucose, Bld: 169 mg/dL — ABNORMAL HIGH (ref 65–99)
POTASSIUM: 4.4 mmol/L (ref 3.5–5.3)
SODIUM: 137 mmol/L (ref 135–146)
TOTAL PROTEIN: 6.7 g/dL (ref 6.1–8.1)

## 2018-06-30 LAB — HEMOGLOBIN A1C
Hgb A1c MFr Bld: 6.9 % of total Hgb — ABNORMAL HIGH (ref <5.7)
Mean Plasma Glucose: 151 (calc)
eAG (mmol/L): 8.4 (calc)

## 2018-06-30 LAB — LIPID PANEL
Cholesterol: 143 mg/dL (ref <200)
HDL: 44 mg/dL — ABNORMAL LOW (ref >50)
LDL Cholesterol (Calc): 76 mg/dL (calc)
NON-HDL CHOLESTEROL (CALC): 99 mg/dL (ref <130)
Total CHOL/HDL Ratio: 3.3 (calc) (ref <5.0)
Triglycerides: 156 mg/dL — ABNORMAL HIGH (ref <150)

## 2018-06-30 LAB — MICROALBUMIN / CREATININE URINE RATIO
Creatinine, Urine: 80 mg/dL (ref 20–275)
Microalb Creat Ratio: 6 mcg/mg creat (ref <30)
Microalb, Ur: 0.5 mg/dL

## 2018-06-30 NOTE — Therapy (Signed)
[facility] [address], [place], [address] Phone: [phone]   Fax:  [phone]  Patient Details  Name: SHALIAH WANN MRN: [id number] Date of Birth: Apr 30, 1966 Referring Provider:  Arnetha Courser, MD  Encounter Date: 06/30/2018   Shelton Silvas 06/30/2018, 2:01 PM  [facility] [address] [place], [address] Phone: [phone]   Fax:  [phone]
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
## 2018-07-02 ENCOUNTER — Ambulatory Visit: Payer: Medicare Other | Admitting: Physical Therapy

## 2018-07-03 ENCOUNTER — Encounter: Payer: Self-pay | Admitting: Physical Therapy

## 2018-07-03 ENCOUNTER — Ambulatory Visit: Payer: Medicare Other | Admitting: Physical Therapy

## 2018-07-03 DIAGNOSIS — M5442 Lumbago with sciatica, left side: Principal | ICD-10-CM

## 2018-07-03 DIAGNOSIS — G8929 Other chronic pain: Secondary | ICD-10-CM | POA: Diagnosis not present

## 2018-07-03 DIAGNOSIS — M5441 Lumbago with sciatica, right side: Secondary | ICD-10-CM | POA: Diagnosis not present
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
## 2018-07-07 ENCOUNTER — Ambulatory Visit: Payer: Medicare Other | Admitting: Physical Therapy

## 2018-07-09 ENCOUNTER — Encounter: Payer: Self-pay | Admitting: Physical Therapy

## 2018-07-09 ENCOUNTER — Ambulatory Visit: Payer: Medicare Other | Attending: Family Medicine | Admitting: Physical Therapy

## 2018-07-09 DIAGNOSIS — G8929 Other chronic pain: Secondary | ICD-10-CM | POA: Diagnosis not present

## 2018-07-09 DIAGNOSIS — M5441 Lumbago with sciatica, right side: Secondary | ICD-10-CM | POA: Insufficient documentation

## 2018-07-09 DIAGNOSIS — M5442 Lumbago with sciatica, left side: Secondary | ICD-10-CM | POA: Insufficient documentation
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
## 2018-07-14 ENCOUNTER — Ambulatory Visit: Payer: Medicare Other | Admitting: Physical Therapy

## 2018-07-14 ENCOUNTER — Encounter: Payer: Self-pay | Admitting: Physical Therapy

## 2018-07-14 DIAGNOSIS — G8929 Other chronic pain: Secondary | ICD-10-CM

## 2018-07-14 DIAGNOSIS — M5442 Lumbago with sciatica, left side: Secondary | ICD-10-CM | POA: Diagnosis not present

## 2018-07-14 DIAGNOSIS — M5441 Lumbago with sciatica, right side: Principal | ICD-10-CM
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
## 2018-07-21 ENCOUNTER — Ambulatory Visit: Payer: Medicare Other | Admitting: Physical Therapy

## 2018-07-28 ENCOUNTER — Encounter: Payer: Self-pay | Admitting: Physical Therapy

## 2018-07-28 ENCOUNTER — Ambulatory Visit: Payer: Medicare Other | Admitting: Physical Therapy

## 2018-07-28 DIAGNOSIS — M5442 Lumbago with sciatica, left side: Principal | ICD-10-CM

## 2018-07-28 DIAGNOSIS — M5441 Lumbago with sciatica, right side: Secondary | ICD-10-CM | POA: Diagnosis not present

## 2018-07-28 DIAGNOSIS — G8929 Other chronic pain: Secondary | ICD-10-CM
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
## 2018-08-03 ENCOUNTER — Ambulatory Visit (INDEPENDENT_AMBULATORY_CARE_PROVIDER_SITE_OTHER): Payer: Medicare Other | Admitting: Nurse Practitioner

## 2018-08-03 ENCOUNTER — Encounter: Payer: Self-pay | Admitting: Nurse Practitioner

## 2018-08-03 VITALS — BP 120/70 | HR 90 | Temp 97.7°F | Resp 16 | Ht 67.0 in | Wt 215.1 lb

## 2018-08-03 DIAGNOSIS — R0689 Other abnormalities of breathing: Secondary | ICD-10-CM

## 2018-08-03 DIAGNOSIS — R059 Cough, unspecified: Secondary | ICD-10-CM

## 2018-08-03 DIAGNOSIS — R0981 Nasal congestion: Secondary | ICD-10-CM

## 2018-08-03 DIAGNOSIS — R05 Cough: Secondary | ICD-10-CM | POA: Diagnosis not present

## 2018-08-03 DIAGNOSIS — J029 Acute pharyngitis, unspecified: Secondary | ICD-10-CM | POA: Diagnosis not present

## 2018-08-03 DIAGNOSIS — R0602 Shortness of breath: Secondary | ICD-10-CM

## 2018-08-03 DIAGNOSIS — J3489 Other specified disorders of nose and nasal sinuses: Secondary | ICD-10-CM | POA: Diagnosis not present

## 2018-08-03 MED ORDER — DOXYCYCLINE HYCLATE 100 MG PO TABS: 100.0000 mg | ORAL_TABLET | Freq: Two times a day (BID) | ORAL | 0 refills | Status: DC

## 2018-08-03 MED ORDER — ALBUTEROL SULFATE (2.5 MG/3ML) 0.083% IN NEBU
2.5000 mg | INHALATION_SOLUTION | RESPIRATORY_TRACT | Status: AC
  Administered 2018-08-03: 2.5 mg via RESPIRATORY_TRACT

## 2018-08-03 MED ORDER — BENZONATATE 200 MG PO CAPS: 200.0000 mg | ORAL_CAPSULE | Freq: Two times a day (BID) | ORAL | 0 refills | Status: DC | PRN

## 2018-08-03 MED ORDER — FLUTICASONE PROPIONATE 50 MCG/ACT NA SUSP: 2.0000 | Freq: Every day | NASAL | 6 refills | Status: DC

## 2018-08-03 NOTE — Patient Instructions (Signed)
   It is important that you drink plenty of fluids, rest. Cover your nose/mouth when you cough or sneeze and wash your hands well and often. Here are some helpful things you can use or pick up over the counter from the pharmacy to help with your symptoms:   For Fever/Pain: Acetaminophen every 6 hours as needed (maximum of 3000mg  a day). If you are still uncomfortable you can add ibuprofen OR naproxen  For coughing: try dextromethorphan for a cough suppressant, and/or a cool mist humidifier, lozenges  For sore throat: saline gargles, honey herbal tea, lozenges, throat spray  To dry out your nose: try an antihistamine like loratadine (non-sedating) or diphenhydramine (sedating) or others like xyzal  To relieve a stuffy nose: try an oral decongestant- like flonase,  neti pot To make blowing your nose easier: guaifenesin

## 2018-08-03 NOTE — Progress Notes (Signed)
Name: Elizabeth [name]   MRN: [id number]    DOB: Dec 11, 1965   Date:08/03/2018       Progress Note  Subjective  Chief Complaint  Chief Complaint  Patient presents with  . Cough    HPI  States last Sunday started with sore throat then had runny nose and then started getting cough. Headache, chest pain when coughing. Shortness of breath- dry cough.  States has had walking pneumonia earlier this year. Takes methotrexate. Symptoms have been ongoing for 8 days now and worsening.  grandkids have been sick; grandson has pneumonia.  Tried  alka-seltzer, equate cough and few.   Patient Active Problem List   Diagnosis Date Noted  . Screening for cervical cancer 05/26/2018  . Preventative health care 05/26/2018  . Vaginal atrophy 01/01/2017  . Hyperlipidemia LDL goal <100 10/04/2016  . Medication monitoring encounter 03/16/2016  . Diverticula of colon 01/24/2016  . Hypertension goal BP (blood pressure) < 130/80 01/17/2016  . Impingement syndrome of right shoulder 01/03/2016  . Bursitis, trochanteric 11/22/2015  . Inversion, nipple 11/08/2015  . Diabetes mellitus type 2, controlled, without complications ([place]) 56/43/3295  . Rheumatoid arthritis involving both hands ([place]) 10/23/2015  . Benign neoplasm of descending colon   . Benign neoplasm of sigmoid colon   . Bipolar disorder, current episode depressed, moderate ([place]) 07/03/2015  . IBS (irritable bowel syndrome) 07/03/2015  . Insomnia, uncontrolled 07/03/2015  . GERD without esophagitis 07/03/2015  . Obesity (BMI 30-39.9) 07/03/2015  . Bilateral hip bursitis 07/03/2015    Past Medical History:  Diagnosis Date  . Bipolar 1 disorder ([place])   . Depression   . Diabetes mellitus without complication ([place])   . Diverticulosis   . GERD (gastroesophageal reflux disease)   . Heart murmur    followed by PCP  . Hepatitis    B - "not active"  . Hypertension goal BP (blood pressure) < 130/80 01/17/2016  . Insomnia   . Irritable bowel syndrome    . Motion sickness    boats  . Obesity (BMI 30-39.9) 07/03/2015  . RA (rheumatoid arthritis) ([place])   . Sciatica     Past Surgical History:  Procedure Laterality Date  . BOWEL RESECTION    . CHOLECYSTECTOMY  1997   [place], [place]  . COLONOSCOPY WITH PROPOFOL N/A 09/07/2015   Procedure: COLONOSCOPY WITH PROPOFOL;  Surgeon: Lucilla Lame, MD;  Location: [facility];  Service: Endoscopy;  Laterality: N/A;  . ESOPHAGOGASTRODUODENOSCOPY (EGD) WITH ESOPHAGEAL DILATION    . ESOPHAGOGASTRODUODENOSCOPY (EGD) WITH PROPOFOL N/A 09/07/2015   Procedure: ESOPHAGOGASTRODUODENOSCOPY (EGD) WITH PROPOFOL;  Surgeon: Lucilla Lame, MD;  Location: [facility];  Service: Endoscopy;  Laterality: N/A;  . PARTIAL COLECTOMY N/A 01/24/2016   Procedure: SIGMOID COLECTOMY;  Surgeon: Florene Glen, MD;  Location: [facility];  Service: General;  Laterality: N/A;  . POLYPECTOMY N/A 09/07/2015   Procedure: POLYPECTOMY;  Surgeon: Lucilla Lame, MD;  Location: [facility];  Service: Endoscopy;  Laterality: N/A;  . TUBAL LIGATION      Social History   Tobacco Use  . Smoking status: Former Smoker    Packs/day: 1.00    Years: 1.00    Pack years: 1.00    Types: Cigarettes    Last attempt to quit: 05/06/2015    Years since quitting: 3.2  . Smokeless tobacco: Never Used  . Tobacco comment: smoking cessation materials not required  Substance Use Topics  . Alcohol use: No    Alcohol/week: 0.0 standard drinks  Current Outpatient Medications:  .  ACCU-CHEK FASTCLIX LANCETS MISC, May check fingerstick blood sugar once a day if desired;  E11.9; bring readings to appt, Disp: 102 each, Rfl: 3 .  aspirin 81 MG tablet, Take 81 mg by mouth daily., Disp: , Rfl:  .  atorvastatin (LIPITOR) 10 MG tablet, take 1 tablet by mouth at bedtime, Disp: 30 tablet, Rfl: 5 .  B-D INS SYR ULTRAFINE 1CC/31G 31G X 5/16" 1 ML MISC, use as directed TO INJECT METHOTREXATE, Disp: , Rfl: 1 .  Blood Glucose Monitoring Suppl  (ACCU-CHEK NANO SMARTVIEW) w/Device KIT, Use as directed once daily. LON 99, E11.9, Disp: 1 kit, Rfl: 0 .  cholecalciferol (VITAMIN D) 1000 units tablet, Take 1,000 Units by mouth daily., Disp: , Rfl:  .  cyclobenzaprine (FLEXERIL) 5 MG tablet, Take 1 tablet (5 mg total) by mouth at bedtime., Disp: 30 tablet, Rfl: 1 .  folic acid (FOLVITE) 1 MG tablet, Take 1 mg by mouth daily., Disp: , Rfl:  .  gabapentin (NEURONTIN) 300 MG capsule, One by mouth in the morning, one in the afternoon, and two at bedtime, Disp: 120 capsule, Rfl: 5 .  glucose blood (ACCU-CHEK SMARTVIEW) test strip, Check fingerstick blood sugars once a day; E11.65, LON 99 months, Disp: 100 each, Rfl: 1 .  LINZESS 290 MCG CAPS capsule, take 1 capsule by mouth once daily, Disp: 30 capsule, Rfl: 11 .  methotrexate 50 MG/2ML injection, inject 0.6 milliliter subcutaneously every week, Disp: , Rfl: 0 .  Multiple Vitamins-Minerals (MULTIVITAMIN/EXTRA VITAMIN D3 PO), Take by mouth., Disp: , Rfl:  .  OLANZapine (ZYPREXA) 15 MG tablet, Take 15 mg by mouth at bedtime., Disp: , Rfl: 0 .  traZODone (DESYREL) 150 MG tablet, Take 150 mg by mouth at bedtime., Disp: , Rfl: 0  Allergies  Allergen Reactions  . Ace Inhibitors Swelling    Angioedema (specifically from enalapril)  . Enalapril Swelling    angioedema  . Penicillins Hives and Swelling    Has patient had a PCN reaction causing immediate rash, facial/tongue/throat swelling, SOB or lightheadedness with hypotension: Yes Has patient had a PCN reaction causing severe rash involving mucus membranes or skin necrosis: No Has patient had a PCN reaction that required hospitalization Yes Has patient had a PCN reaction occurring within the last 10 years: No If all of the above answers are "NO", then may proceed with Cephalosporin use.  . Statins Hives    ROS   No other specific complaints in a complete review of systems (except as listed in HPI above).  Objective  Vitals:   08/03/18 1430   BP: 120/70  Pulse: 90  Resp: 16  Temp: 97.7 F (36.5 C)  TempSrc: Oral  SpO2: 98%  Weight: 215 lb 1.6 oz (97.6 kg)  Height: '5\' 7"'  (1.702 m)     Body mass index is 33.69 kg/m.  Nursing Note and Vital Signs reviewed.  Physical Exam  Constitutional: She is oriented to person, place, and time. She appears well-developed and well-nourished. No distress.  HENT:  Head: Normocephalic and atraumatic.  Right Ear: Hearing, tympanic membrane, external ear and ear canal normal.  Left Ear: Hearing, tympanic membrane, external ear and ear canal normal.  Nose: Mucosal edema present. Right sinus exhibits no maxillary sinus tenderness and no frontal sinus tenderness. Left sinus exhibits no maxillary sinus tenderness and no frontal sinus tenderness.  Mouth/Throat: Uvula is midline, oropharynx is clear and moist and mucous membranes are normal. No oropharyngeal exudate.  Eyes: Pupils  are equal, round, and reactive to light. Conjunctivae and EOM are normal. Right eye exhibits no discharge. Left eye exhibits no discharge.  Cardiovascular: Normal rate, regular rhythm, normal heart sounds and intact distal pulses.  Pulmonary/Chest: Effort normal. No stridor. No respiratory distress. She has wheezes. She has rales. She exhibits no tenderness.  Abdominal: Soft. Bowel sounds are normal.  Lymphadenopathy:    She has no cervical adenopathy.  Neurological: She is alert and oriented to person, place, and time.  Skin: Skin is warm and dry. Capillary refill takes less than 2 seconds. She is not diaphoretic. No erythema.  Psychiatric: She has a normal mood and affect. Her behavior is normal. Judgment and thought content normal.     No results found for this or any previous visit (from the past 48 hour(s)).  Assessment & Plan  1. Adventitious breath sounds Post-breathing treatment lung still sounds adventitious bilaterally throughout. Discussed ER precautions, will hold off on x-ray as we will be treating  for pneumonia due to history and physical exam. Will follow-up on Friday or sooner if worsening or not improving.  - albuterol (PROVENTIL) (2.5 MG/3ML) 0.083% nebulizer solution 2.5 mg - doxycycline (VIBRA-TABS) 100 MG tablet; Take 1 tablet (100 mg total) by mouth 2 (two) times daily.  Dispense: 20 tablet; Refill: 0  2. Cough - benzonatate (TESSALON) 200 MG capsule; Take 1 capsule (200 mg total) by mouth 2 (two) times daily as needed for cough.  Dispense: 30 capsule; Refill: 0 - doxycycline (VIBRA-TABS) 100 MG tablet; Take 1 tablet (100 mg total) by mouth 2 (two) times daily.  Dispense: 20 tablet; Refill: 0  3. Rhinorrhea -discussed OTC management   4. Shortness of breath - albuterol (PROVENTIL) (2.5 MG/3ML) 0.083% nebulizer solution 2.5 mg - doxycycline (VIBRA-TABS) 100 MG tablet; Take 1 tablet (100 mg total) by mouth 2 (two) times daily.  Dispense: 20 tablet; Refill: 0  5. Sore throat -discussed OTC management  6. Nasal congestion  - fluticasone (FLONASE) 50 MCG/ACT nasal spray; Place 2 sprays into both nostrils daily.  Dispense: 16 g; Refill: 6    -Red flags and when to present for emergency care or RTC including fever >101.30F, chest pain, shortness of breath, new/worsening/un-resolving symptoms, reviewed with patient at time of visit. Follow up and care instructions discussed and provided in AVS.

## 2018-08-04 ENCOUNTER — Ambulatory Visit: Payer: Medicare Other | Admitting: Physical Therapy

## 2018-08-12 ENCOUNTER — Encounter: Payer: Self-pay | Admitting: Physical Therapy

## 2018-08-12 ENCOUNTER — Ambulatory Visit: Payer: Medicare Other | Attending: Family Medicine | Admitting: Physical Therapy

## 2018-08-12 DIAGNOSIS — M5441 Lumbago with sciatica, right side: Secondary | ICD-10-CM | POA: Insufficient documentation

## 2018-08-12 DIAGNOSIS — G8929 Other chronic pain: Secondary | ICD-10-CM | POA: Diagnosis not present

## 2018-08-12 DIAGNOSIS — M5442 Lumbago with sciatica, left side: Secondary | ICD-10-CM | POA: Insufficient documentation
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
## 2018-08-20 ENCOUNTER — Encounter: Payer: Self-pay | Admitting: Physical Therapy

## 2018-08-20 ENCOUNTER — Ambulatory Visit: Payer: Medicare Other | Admitting: Physical Therapy

## 2018-08-20 DIAGNOSIS — M5442 Lumbago with sciatica, left side: Secondary | ICD-10-CM | POA: Diagnosis not present

## 2018-08-20 DIAGNOSIS — M5441 Lumbago with sciatica, right side: Secondary | ICD-10-CM | POA: Diagnosis not present

## 2018-08-20 DIAGNOSIS — G8929 Other chronic pain: Secondary | ICD-10-CM | POA: Diagnosis not present
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
## 2018-08-25 ENCOUNTER — Other Ambulatory Visit: Payer: Self-pay | Admitting: Family Medicine

## 2018-08-25 DIAGNOSIS — M5440 Lumbago with sciatica, unspecified side: Principal | ICD-10-CM

## 2018-08-25 DIAGNOSIS — G8929 Other chronic pain: Secondary | ICD-10-CM

## 2018-08-27 ENCOUNTER — Ambulatory Visit: Payer: Medicare Other | Admitting: Physical Therapy

## 2018-09-05 ENCOUNTER — Other Ambulatory Visit: Payer: Self-pay | Admitting: Family Medicine

## 2018-09-26 LAB — HEMOGLOBIN A1C: Hgb A1c MFr Bld: 6.9 — AB (ref 4.0–6.0)

## 2018-09-29 ENCOUNTER — Other Ambulatory Visit: Payer: Self-pay | Admitting: Family Medicine

## 2018-09-29 ENCOUNTER — Ambulatory Visit (INDEPENDENT_AMBULATORY_CARE_PROVIDER_SITE_OTHER): Payer: Medicare Other | Admitting: Family Medicine

## 2018-09-29 ENCOUNTER — Encounter: Payer: Self-pay | Admitting: Family Medicine

## 2018-09-29 VITALS — BP 106/72 | HR 85 | Temp 98.1°F | Ht 66.0 in | Wt 227.8 lb

## 2018-09-29 DIAGNOSIS — E119 Type 2 diabetes mellitus without complications: Secondary | ICD-10-CM | POA: Diagnosis not present

## 2018-09-29 DIAGNOSIS — I10 Essential (primary) hypertension: Secondary | ICD-10-CM | POA: Diagnosis not present

## 2018-09-29 DIAGNOSIS — E669 Obesity, unspecified: Secondary | ICD-10-CM | POA: Insufficient documentation

## 2018-09-29 DIAGNOSIS — E785 Hyperlipidemia, unspecified: Secondary | ICD-10-CM | POA: Diagnosis not present

## 2018-09-29 DIAGNOSIS — R42 Dizziness and giddiness: Secondary | ICD-10-CM | POA: Diagnosis not present

## 2018-09-29 DIAGNOSIS — F3132 Bipolar disorder, current episode depressed, moderate: Secondary | ICD-10-CM

## 2018-09-29 MED ORDER — METFORMIN HCL ER 500 MG PO TB24: ORAL_TABLET | ORAL | 0 refills | Status: DC

## 2018-09-29 NOTE — Assessment & Plan Note (Signed)
On no medicines; encouraged healthy eating and weight loss

## 2018-09-29 NOTE — Assessment & Plan Note (Signed)
Followed by [facility]; encouraged patient to talk to her counselor at [facility] about her stress and other coping skills besides eating to deal with her situation

## 2018-09-29 NOTE — Assessment & Plan Note (Signed)
Foot exam by MD; start metformin; recheck A1c in 3 months; weight loss encouraged

## 2018-09-29 NOTE — Progress Notes (Signed)
BP 106/72   Pulse 85   Temp 98.1 F (36.7 C) (Oral)   Ht 5\' 6"  (1.676 m)   Wt 227 lb 12.8 oz (103.3 kg)   LMP 07/09/2014   SpO2 99%   BMI 36.77 kg/m    Subjective:    Patient ID: Elizabeth [name], female    DOB: [birth year], 52 y.o.   MRN: [id number]  HPI: Elizabeth [name] is a 52 y.o. female  Chief Complaint  Patient presents with  . Follow-up    HPI Here for follow-up  Type 2 diabetes; home health nurse came down; female nurse came out to the house; just had A1c of 6.9 and had anther A1c on Saturday October 19th that was also 6.9; home health nurse did a urine microalbumin as well; her last one with me was totally normal in July; eats lots of carbs and sweets; no sodas; she does not take any medicine right now, wanted to see what she could  Lab Results  Component Value Date   HGBA1C 6.9 (A) 09/26/2018   High cholesterol; no medicines changed after that last check; she has been eating junk; son and daughter staying with her, "it's just too much"; she is stressed out with them being there; can't say no; for stress, can listen to music  Lab Results  Component Value Date   CHOL 143 06/29/2018   HDL 44 (L) 06/29/2018   LDLCALC 76 06/29/2018   TRIG 156 (H) 06/29/2018   CHOLHDL 3.3 06/29/2018   Obesity; under stress; eating for stress  She has three episodes of vertigo in the last 3 weeks; episodes last about 1 to 2 minutes; she was washing dishes once, and just watching TV once, and outside walking when it happened; did not say anything to family No trouble with speech or swallowing or headaches; no one in the family with strokes Taking aspirin  Bipolar d/o 1; managed by [facility]; under stress; on medicines; no SI/HI; has counselor  Depression screen [facility] 2/9 09/29/2018 06/29/2018 05/26/2018 04/16/2018 03/27/2018  Decreased Interest 0 0 0 0 0  Down, Depressed, Hopeless 0 0 0 0 0  PHQ - 2 Score 0 0 0 0 0  Altered sleeping - - - - 0  Tired, decreased energy - - - - 0  Change in  appetite - - - - 0  Feeling bad or failure about yourself  - - - - 0  Trouble concentrating - - - - 0  Moving slowly or fidgety/restless - - - - 0  Suicidal thoughts - - - - 0  PHQ-9 Score - - - - 0  Difficult doing work/chores - - - - Not difficult at all   Fall Risk  09/29/2018 08/03/2018 06/29/2018 05/26/2018 04/16/2018  Falls in the past year? No No No No No  Comment - - - - -  Number falls in past yr: - - - - -  Injury with Fall? - - - - -  Comment - - - - -  Risk for fall due to : - - - - -  Risk for fall due to: Comment - - - - -  Follow up - - - - -    Relevant past medical, surgical, family and social history reviewed Past Medical History:  Diagnosis Date  . Bipolar 1 disorder ([place])   . Depression   . Diabetes mellitus without complication ([place])   . Diverticulosis   . GERD (gastroesophageal reflux disease)   .  Heart murmur    followed by PCP  . Hepatitis    B - "not active"  . Hypertension goal BP (blood pressure) < 130/80 01/17/2016  . Insomnia   . Irritable bowel syndrome   . Motion sickness    boats  . Obesity (BMI 30-39.9) 07/03/2015  . RA (rheumatoid arthritis) ([place])   . Sciatica    Past Surgical History:  Procedure Laterality Date  . BOWEL RESECTION    . CHOLECYSTECTOMY  1997   [place], [place]  . COLONOSCOPY WITH PROPOFOL N/A 09/07/2015   Procedure: COLONOSCOPY WITH PROPOFOL;  Surgeon: Lucilla Lame, MD;  Location: [place];  Service: Endoscopy;  Laterality: N/A;  . ESOPHAGOGASTRODUODENOSCOPY (EGD) WITH ESOPHAGEAL DILATION    . ESOPHAGOGASTRODUODENOSCOPY (EGD) WITH PROPOFOL N/A 09/07/2015   Procedure: ESOPHAGOGASTRODUODENOSCOPY (EGD) WITH PROPOFOL;  Surgeon: Lucilla Lame, MD;  Location: [place];  Service: Endoscopy;  Laterality: N/A;  . PARTIAL COLECTOMY N/A 01/24/2016   Procedure: SIGMOID COLECTOMY;  Surgeon: Florene Glen, MD;  Location: [facility];  Service: General;  Laterality: N/A;  . POLYPECTOMY N/A 09/07/2015   Procedure: POLYPECTOMY;   Surgeon: Lucilla Lame, MD;  Location: [place];  Service: Endoscopy;  Laterality: N/A;  . TUBAL LIGATION     Family History  Problem Relation Age of Onset  . Hypertension Mother   . Diabetes Mother   . Arthritis/Rheumatoid Mother   . Hyperlipidemia Mother   . Alcohol abuse Father   . Esophageal varices Father   . Diverticulosis Maternal Aunt   . Diverticulosis Maternal Grandmother   . Diabetes Maternal Grandmother   . Heart disease Maternal Grandmother   . Dementia Maternal Grandmother   . Diabetes Brother   . Kidney disease Brother   . Aneurysm Maternal Grandfather   . Diabetes Paternal Grandmother   . Healthy Brother   . Depression Daughter        bipolar disorder  . Depression Daughter        bipolar disorder  . Breast cancer Neg Hx    Social History   Tobacco Use  . Smoking status: Former Smoker    Packs/day: 1.00    Years: 1.00    Pack years: 1.00    Types: Cigarettes    Last attempt to quit: 05/06/2015    Years since quitting: 3.4  . Smokeless tobacco: Never Used  . Tobacco comment: smoking cessation materials not required  Substance Use Topics  . Alcohol use: No    Alcohol/week: 0.0 standard drinks  . Drug use: No     Office Visit from 09/29/2018 in [facility]  AUDIT-C Score  0      Interim medical history since last visit reviewed. Allergies and medications reviewed  Review of Systems Per HPI unless specifically indicated above     Objective:    BP 106/72   Pulse 85   Temp 98.1 F (36.7 C) (Oral)   Ht 5\' 6"  (1.676 m)   Wt 227 lb 12.8 oz (103.3 kg)   LMP 07/09/2014   SpO2 99%   BMI 36.77 kg/m   Wt Readings from Last 3 Encounters:  09/29/18 227 lb 12.8 oz (103.3 kg)  08/03/18 215 lb 1.6 oz (97.6 kg)  06/29/18 222 lb 6.4 oz (100.9 kg)    Physical Exam  Constitutional: She appears well-developed and well-nourished. No distress.  HENT:  Head: Normocephalic and atraumatic.  Eyes: EOM are normal. No scleral  icterus.  Neck: No thyromegaly present.  Cardiovascular: Normal  rate, regular rhythm and normal heart sounds.  No murmur heard. Pulmonary/Chest: Effort normal and breath sounds normal. No respiratory distress. She has no wheezes.  Abdominal: Soft. Bowel sounds are normal. She exhibits no distension.  Musculoskeletal: She exhibits no edema.  Neurological: She is alert. She has normal strength. She displays no tremor. No cranial nerve deficit or sensory deficit. Gait normal.  Swayed just a little during romberg testing, patient felt she was swaying some subjectively; normal gait, but difficulty with tandem walk because of her back she reported  Skin: Skin is warm and dry. She is not diaphoretic. No pallor.  Psychiatric: She has a normal mood and affect. Her mood appears not anxious. She does not exhibit a depressed mood.   Diabetic Foot Form - Detailed   Diabetic Foot Exam - detailed Diabetic Foot exam was performed with the following findings:  Yes 09/29/2018 10:07 AM  Pulse Foot Exam completed.:  Yes  Right Dorsalis Pedis:  Present Left Dorsalis Pedis:  Present  Sensory Foot Exam Completed.:  Yes Semmes-Weinstein Monofilament Test R Site 1-Great Toe:  Pos L Site 1-Great Toe:  Pos       Results for orders placed or performed in visit on 06/29/18  COMPLETE METABOLIC PANEL WITH GFR  Result Value Ref Range   Glucose, Bld 169 (H) 65 - 99 mg/dL   BUN 11 7 - 25 mg/dL   Creat 0.78 0.50 - 1.05 mg/dL   GFR, Est Non African American 88 > OR = 60 mL/min/1.29m2   GFR, Est African American 102 > OR = 60 mL/min/1.37m2   BUN/Creatinine Ratio NOT APPLICABLE 6 - 22 (calc)   Sodium 137 135 - 146 mmol/L   Potassium 4.4 3.5 - 5.3 mmol/L   Chloride 104 98 - 110 mmol/L   CO2 28 20 - 32 mmol/L   Calcium 9.8 8.6 - 10.4 mg/dL   Total Protein 6.7 6.1 - 8.1 g/dL   Albumin 4.0 3.6 - 5.1 g/dL   Globulin 2.7 1.9 - 3.7 g/dL (calc)   AG Ratio 1.5 1.0 - 2.5 (calc)   Total Bilirubin 0.4 0.2 - 1.2 mg/dL    Alkaline phosphatase (APISO) 97 33 - 130 U/L   AST 18 10 - 35 U/L   ALT 22 6 - 29 U/L  Hemoglobin A1c  Result Value Ref Range   Hgb A1c MFr Bld 6.9 (H) <5.7 % of total Hgb   Mean Plasma Glucose 151 (calc)   eAG (mmol/L) 8.4 (calc)  Lipid panel  Result Value Ref Range   Cholesterol 143 <200 mg/dL   HDL 44 (L) >50 mg/dL   Triglycerides 156 (H) <150 mg/dL   LDL Cholesterol (Calc) 76 mg/dL (calc)   Total CHOL/HDL Ratio 3.3 <5.0 (calc)   Non-HDL Cholesterol (Calc) 99 <130 mg/dL (calc)  Microalbumin / creatinine urine ratio  Result Value Ref Range   Creatinine, Urine 80 20 - 275 mg/dL   Microalb, Ur 0.5 mg/dL   Microalb Creat Ratio 6 <30 mcg/mg creat      Assessment & Plan:   Problem List Items Addressed This Visit      Cardiovascular and Mediastinum   Hypertension goal BP (blood pressure) < 130/80 (Chronic)    On no medicines; encouraged healthy eating and weight loss        Endocrine   Diabetes mellitus type 2, controlled, without complications (HCC) - Primary (Chronic)    Foot exam by MD; start metformin; recheck A1c in 3 months; weight loss encouraged  Relevant Medications   metFORMIN (GLUCOPHAGE XR) 500 MG 24 hr tablet   Other Relevant Orders   Amb ref to Medical Nutrition Therapy-MNT     Other   Morbid obesity ([place])    BMI over 35 plus comorbidities (DM, high chol); refer to nutritionist; aim to lose 1 pound a week to get to 199 in 28 weeks just over half a year      Relevant Medications   metFORMIN (GLUCOPHAGE XR) 500 MG 24 hr tablet   Other Relevant Orders   Amb ref to Medical Nutrition Therapy-MNT   Hyperlipidemia LDL goal <100    Encouraged weight loss and healthier eating; weight loss encouraged      Bipolar disorder, current episode depressed, moderate (HCC) (Chronic)    Followed by [facility]; encouraged patient to talk to her counselor at [facility] about her stress and other coping skills besides eating to deal with her situation       Other Visit Diagnoses     Vertigo       refer to neurologist; call 911 if thinking she is having a stroke; continue aspirin and statin   Relevant Orders   Ambulatory referral to Neurology       Follow up plan: Return in about 3 months (around 12/30/2018) for twenty minute follow-up with fasting labs.  An after-visit summary was printed and given to the patient at [facility].  Please see the patient instructions which may contain other information and recommendations beyond what is mentioned above in the assessment and plan.  Meds ordered this encounter  Medications  . metFORMIN (GLUCOPHAGE XR) 500 MG 24 hr tablet    Sig: One by mouth daily for one week, then two a day (for diabetes)    Dispense:  53 tablet    Refill:  0    Orders Placed This Encounter  Procedures  . Ambulatory referral to Neurology  . Amb ref to Medical Nutrition Therapy-MNT

## 2018-09-29 NOTE — Assessment & Plan Note (Signed)
Encouraged weight loss and healthier eating; weight loss encouraged

## 2018-09-29 NOTE — Assessment & Plan Note (Addendum)
BMI over 35 plus comorbidities (DM, high chol); refer to nutritionist; aim to lose 1 pound a week to get to 199 in 28 weeks just over half a year

## 2018-09-29 NOTE — Patient Instructions (Addendum)
Really try to eat better and feed you body and soul healthy food Stress reduction is key Talk to your counselor about your eating because of stress Return in 3 months for next visit Start the metformin  Check out the information at familydoctor.org entitled "Nutrition for Weight Loss: What You Need to Know about Fad Diets" Try to lose between 1-2 pounds per week by taking in fewer calories and burning off more calories You can succeed by limiting portions, limiting foods dense in calories and fat, becoming more active, and drinking 8 glasses of water a day (64 ounces) Don't skip meals, especially breakfast, as skipping meals may alter your metabolism Do not use over-the-counter weight loss pills or gimmicks that claim rapid weight loss A healthy BMI (or body mass index) is between 18.5 and 24.9 You can calculate your ideal BMI at the [place] website ClubMonetize.fr   12 Ways to Curb Anxiety  ?Anxiety is normal human sensation. It is what helped our ancestors survive the pitfalls of the wilderness. Anxiety is defined as experiencing worry or nervousness about an imminent event or something with an uncertain outcome. It is a feeling experienced by most people at some point in their lives. Anxiety can be triggered by a very personal issue, such as the illness of a loved one, or an event of global proportions, such as a refugee crisis. Some of the symptoms of anxiety are:  Feeling restless.  Having a feeling of impending danger.  Increased heart rate.  Rapid breathing. Sweating.  Shaking.  Weakness or feeling tired.  Difficulty concentrating on anything except the current worry.  Insomnia.  Stomach or bowel problems. What can we do about anxiety we may be feeling? There are many techniques to help manage stress and relax. Here are 12 ways you can reduce your anxiety almost immediately: 1. Turn off the constant feed of information. Take a social  media sabbatical. Studies have shown that social media directly contributes to social anxiety.  2. Monitor your television viewing habits. Are you watching shows that are also contributing to your anxiety, such as 24-hour news stations? Try watching something else, or better yet, nothing at all. Instead, listen to music, read an inspirational book or practice a hobby. 3. Eat nutritious meals. Also, don't skip meals and keep healthful snacks on hand. Hunger and poor diet contributes to feeling anxious. 4. Sleep. Sleeping on a regular schedule for at least seven to eight hours a night will do wonders for your outlook when you are awake. 5. Exercise. Regular exercise will help rid your body of that anxious energy and help you get more restful sleep. 6. Try deep (diaphragmatic) breathing. Inhale slowly through your nose for five seconds and exhale through your mouth. 7. Practice acceptance and gratitude. When anxiety hits, accept that there are things out of your control that shouldn't be of immediate concern.  8. Seek out humor. When anxiety strikes, watch a funny video, read jokes or call a friend who makes you laugh. Laughter is healing for our bodies and releases endorphins that are calming. 9. Stay positive. Take the effort to replace negative thoughts with positive ones. Try to see a stressful situation in a positive light. Try to come up with solutions rather than dwelling on the problem. 10. Figure out what triggers your anxiety. Keep a journal and make note of anxious moments and the events surrounding them. This will help you identify triggers you can avoid or even eliminate. 11. Talk to someone. Let  a trusted friend, family member or even trained professional know that you are feeling overwhelmed and anxious. Verbalize what you are feeling and why.  12. Volunteer. If your anxiety is triggered by a crisis on a large scale, become an advocate and work to resolve the problem that is causing you  unease. Anxiety is often unwelcome and can become overwhelming. If not kept in check, it can become a disorder that could require medical treatment. However, if you take the time to care for yourself and avoid the triggers that make you anxious, you will be able to find moments of relaxation and clarity that make your life much more enjoyable.   Obesity, Adult Obesity is the condition of having too much total body fat. Being overweight or obese means that your weight is greater than what is considered healthy for your body size. Obesity is determined by a measurement called BMI. BMI is an estimate of body fat and is calculated from height and weight. For adults, a BMI of 30 or higher is considered obese. Obesity can eventually lead to other health concerns and major illnesses, including:  Stroke.  Coronary artery disease (CAD).  Type 2 diabetes.  Some types of cancer, including cancers of the colon, breast, uterus, and gallbladder.  Osteoarthritis.  High blood pressure (hypertension).  High cholesterol.  Sleep apnea.  Gallbladder stones.  Infertility problems.  What are the causes? The main cause of obesity is taking in (consuming) more calories than your body uses for energy. Other factors that contribute to this condition may include:  Being born with genes that make you more likely to become obese.  Having a medical condition that causes obesity. These conditions include: ? Hypothyroidism. ? Polycystic ovarian syndrome (PCOS). ? Binge-eating disorder. ? Cushing syndrome.  Taking certain medicines, such as steroids, antidepressants, and seizure medicines.  Not being physically active (sedentary lifestyle).  Living where there are limited places to exercise safely or buy healthy foods.  Not getting enough sleep.  What increases the risk? The following factors may increase your risk of this condition:  Having a family history of obesity.  Being a woman of  African-American descent.  Being a man of Hispanic descent.  What are the signs or symptoms? Having excessive body fat is the main symptom of this condition. How is this diagnosed? This condition may be diagnosed based on:  Your symptoms.  Your medical history.  A physical exam. Your health care provider may measure: ? Your BMI. If you are an adult with a BMI between 25 and less than 30, you are considered overweight. If you are an adult with a BMI of 30 or higher, you are considered obese. ? The distances around your hips and your waist (circumferences). These may be compared to each other to help diagnose your condition. ? Your skinfold thickness. Your health care provider may gently pinch a fold of your skin and measure it.  How is this treated? Treatment for this condition often includes changing your lifestyle. Treatment may include some or all of the following:  Dietary changes. Work with your health care provider and a dietitian to set a weight-loss goal that is healthy and reasonable for you. Dietary changes may include eating: ? Smaller portions. A portion size is the amount of a particular food that is healthy for you to eat at one time. This varies from person to person. ? Low-calorie or low-fat options. ? More whole grains, fruits, and vegetables.  Regular physical activity. This  may include aerobic activity (cardio) and strength training.  Medicine to help you lose weight. Your health care provider may prescribe medicine if you are unable to lose 1 pound a week after 6 weeks of eating more healthily and doing more physical activity.  Surgery. Surgical options may include gastric banding and gastric bypass. Surgery may be done if: ? Other treatments have not helped to improve your condition. ? You have a BMI of 40 or higher. ? You have life-threatening health problems related to obesity.  Follow these instructions at home:  Eating and drinking   Follow  recommendations from your health care provider about what you eat and drink. Your health care provider may advise you to: ? Limit fast foods, sweets, and processed snack foods. ? Choose low-fat options, such as low-fat milk instead of whole milk. ? Eat 5 or more servings of fruits or vegetables every day. ? Eat at home more often. This gives you more control over what you eat. ? Choose healthy foods when you eat out. ? Learn what a healthy portion size is. ? Keep low-fat snacks on hand. ? Avoid sugary drinks, such as soda, fruit juice, iced tea sweetened with sugar, and flavored milk. ? Eat a healthy breakfast.  Drink enough water to keep your urine clear or pale yellow.  Do not go without eating for long periods of time (do not fast) or follow a fad diet. Fasting and fad diets can be unhealthy and even dangerous. Physical Activity  Exercise regularly, as told by your health care provider. Ask your health care provider what types of exercise are safe for you and how often you should exercise.  Warm up and stretch before being active.  Cool down and stretch after being active.  Rest between periods of activity. Lifestyle  Limit the time that you spend in front of your TV, computer, or video game system.  Find ways to reward yourself that do not involve food.  Limit alcohol intake to no more than 1 drink a day for nonpregnant women and 2 drinks a day for men. One drink equals 12 oz of beer, 5 oz of wine, or 1 oz of hard liquor. General instructions  Keep a weight loss journal to keep track of the food you eat and how much you exercise you get.  Take over-the-counter and prescription medicines only as told by your health care provider.  Take vitamins and supplements only as told by your health care provider.  Consider joining a support group. Your health care provider may be able to recommend a support group.  Keep all follow-up visits as told by your health care provider. This  is important. Contact a health care provider if:  You are unable to meet your weight loss goal after 6 weeks of dietary and lifestyle changes. This information is not intended to replace advice given to you by your health care provider. Make sure you discuss any questions you have with your health care provider. Document Released: 01/02/2005 Document Revised: 04/29/2016 Document Reviewed: 09/13/2015 Elsevier Interactive Patient Education  2018 [place].  Preventing Unhealthy [employer], Adult Staying at a healthy weight is important. When fat builds up in your body, you may become overweight or obese. These conditions put you at greater risk for developing certain health problems, such as heart disease, diabetes, sleeping problems, joint problems, and some cancers. Unhealthy weight gain is often the result of making unhealthy choices in what you eat. It is also a result  of not getting enough exercise. You can make changes to your lifestyle to prevent obesity and stay as healthy as possible. What nutrition changes can be made? To maintain a healthy weight and prevent obesity:  Eat only as much as your body needs. To do this: ? Pay attention to signs that you are hungry or full. Stop eating as soon as you feel full. ? If you feel hungry, try drinking water first. Drink enough water so your urine is clear or pale yellow. ? Eat smaller portions. ? Look at serving sizes on food labels. Most foods contain more than one serving per container. ? Eat the recommended amount of calories for your gender and activity level. While most active people should eat around 2,000 calories per day, if you are trying to lose weight or are not very active, you main need to eat less calories. Talk to your health care provider or dietitian about how many calories you should eat each day.  Choose healthy foods, such as: ? Fruits and vegetables. Try to fill at least half of your plate at each meal with fruits and  vegetables. ? Whole grains, such as whole wheat bread, brown rice, and quinoa. ? Lean meats, such as chicken or fish. ? Other healthy proteins, such as beans, eggs, or tofu. ? Healthy fats, such as nuts, seeds, fatty fish, and olive oil. ? Low-fat or fat-free dairy.  Check food labels and avoid food and drinks that: ? Are high in calories. ? Have added sugar. ? Are high in sodium. ? Have saturated fats or trans fats.  Limit how much you eat of the following foods: ? Prepackaged meals. ? Fast food. ? Fried foods. ? Processed meat, such as bacon, sausage, and deli meats. ? Fatty cuts of red meat and poultry with skin.  Cook foods in healthier ways, such as by baking, broiling, or grilling.  When grocery shopping, try to shop around the outside of the store. This helps you buy mostly fresh foods and avoid canned and prepackaged foods.  What lifestyle changes can be made?  Exercise at least 30 minutes 5 or more days each week. Exercising includes brisk walking, yard work, biking, running, swimming, and team sports like basketball and soccer. Ask your health care provider which exercises are safe for you.  Do not use any products that contain nicotine or tobacco, such as cigarettes and e-cigarettes. If you need help quitting, ask your health care provider.  Limit alcohol intake to no more than 1 drink a day for nonpregnant women and 2 drinks a day for men. One drink equals 12 oz of beer, 5 oz of wine, or 1 oz of hard liquor.  Try to get 7-9 hours of sleep each night. What other changes can be made?  Keep a food and activity journal to keep track of: ? What you ate and how many calories you had. Remember to count sauces, dressings, and side dishes. ? Whether you were active, and what exercises you did. ? Your calorie, weight, and activity goals.  Check your weight regularly. Track any changes. If you notice you have gained weight, make changes to your diet or activity  routine.  Avoid taking weight-loss medicines or supplements. Talk to your health care provider before starting any new medicine or supplement.  Talk to your health care provider before trying any new diet or exercise plan. Why are these changes important? Eating healthy, staying active, and having healthy habits not only help prevent obesity,  they also:  Help you to manage stress and emotions.  Help you to connect with friends and family.  Improve your self-esteem.  Improve your sleep.  Prevent long-term health problems.  What can happen if changes are not made? Being obese or overweight can cause you to develop joint or bone problems, which can make it hard for you to stay active or do activities you enjoy. Being obese or overweight also puts stress on your heart and lungs and can lead to health problems like diabetes, heart disease, and some cancers. Where to find more information: Talk with your health care provider or a dietitian about healthy eating and healthy lifestyle choices. You may also find other information through these resources:  U.S. Department of Agriculture MyPlate: FormerBoss.no  American Heart Association: www.heart.org  Centers for Disease Control and Prevention: http://www.wolf.info/  Summary  Staying at a healthy weight is important. It helps prevent certain diseases and health problems, such as heart disease, diabetes, joint problems, sleep disorders, and some cancers.  Being obese or overweight can cause you to develop joint or bone problems, which can make it hard for you to stay active or do activities you enjoy.  You can prevent unhealthy weight gain by eating a healthy diet, exercising regularly, not smoking, limiting alcohol, and getting enough sleep.  Talk with your health care provider or a dietitian for guidance about healthy eating and healthy lifestyle choices. This information is not intended to replace advice given to you by your health care  provider. Make sure you discuss any questions you have with your health care provider. Document Released: 11/26/2016 Document Revised: 01/01/2017 Document Reviewed: 01/01/2017 Elsevier Interactive Patient Education  [employer].  Call 911 if any signs or symptoms of stroke

## 2018-10-05 ENCOUNTER — Other Ambulatory Visit: Payer: Self-pay | Admitting: Family Medicine

## 2018-10-05 DIAGNOSIS — M0609 Rheumatoid arthritis without rheumatoid factor, multiple sites: Secondary | ICD-10-CM | POA: Diagnosis not present

## 2018-10-05 DIAGNOSIS — Z79899 Other long term (current) drug therapy: Secondary | ICD-10-CM | POA: Diagnosis not present

## 2018-10-05 DIAGNOSIS — G5603 Carpal tunnel syndrome, bilateral upper limbs: Secondary | ICD-10-CM | POA: Diagnosis not present

## 2018-10-22 ENCOUNTER — Encounter: Payer: Self-pay | Admitting: Dietician

## 2018-10-22 ENCOUNTER — Telehealth: Payer: Self-pay | Admitting: Dietician

## 2018-10-22 ENCOUNTER — Encounter: Payer: Medicare Other | Attending: Family Medicine | Admitting: Dietician

## 2018-10-22 VITALS — Ht 66.0 in | Wt 224.6 lb

## 2018-10-22 DIAGNOSIS — Z713 Dietary counseling and surveillance: Secondary | ICD-10-CM | POA: Diagnosis not present

## 2018-10-22 DIAGNOSIS — E119 Type 2 diabetes mellitus without complications: Secondary | ICD-10-CM | POA: Diagnosis not present

## 2018-10-22 DIAGNOSIS — Z6835 Body mass index (BMI) 35.0-35.9, adult: Secondary | ICD-10-CM

## 2018-10-22 NOTE — Telephone Encounter (Signed)
Called patient to schedule follow-up visit; left voicemail message requesting a call back, and offered available dates and times.

## 2018-10-22 NOTE — Progress Notes (Signed)
Medical Nutrition Therapy: Visit start time: 0900  end time: 1000  Assessment:  Diagnosis: Diabetes, obesity Past medical history: IBS, history of colon resection, rheumatoid arthritis Psychosocial issues/ stress concerns: patient reports high stress level and feels she is not dealing well with stress; reports binge eating late at night  Preferred learning method:  . Auditory . Visual . Hands-on   Current weight: 224.6lbs Height: 5'6" Medications, supplements: reconciled list in medical record  Progress and evaluation: Patient reports recent HbA1C of 6.9%. She is checking BGs at home and reports improving results. She states her biggest issue at this time is stress related to recent home purchase and other family members now staying in the home as well. She is not sleeping well and snacks on candy and other sweets when she wakes up during the night. She is unable to eat anything in the mornings, will feel nauseated if she does eat. She has been experiencing weight gain and wants to lose weight to improve RA as well as BGs.   Physical activity: walking 20 minutes, 4 times a week; limits outdoor activity due to arthritis pain in cold weather  Dietary Intake:  Usual eating pattern includes 3 meals and 3+ snacks per day. Dining out frequency: 0 meals per week.  Breakfast: usually none; occasionally 2 eggs with bacon or sausage Snack: sometimes cookies while working on [employer]: crackers and tuna + 2 boiled eggs, applesauce Snack: same as am Supper: 6pm-- small portions starches ie rice, potatoes + meat + veg Supper #2: when husband arrives home from work-- same as 6pm, or PBJ sandwich, or cereal (Cheerios) Snack: cheerios, candy, cookies during the night  Beverages: water only  Nutrition Care Education: Topics covered: weight control, diabetes Basic nutrition: basic food groups, appropriate nutrient balance, appropriate meal and snack schedule, general nutrition guidelines     Weight control: benefits of weight control, behavioral changes for weight loss including portion control strategies, importance of stress management, adequate sleep; importance of low fat and low sugar foods Diabetes: appropriate meal and snack schedule, appropriate carb intake and balance Other:  Limiting high fat foods, large portions, and foods high in insoluble fiber, raw veg and fruits with skins, seeds due to colon resection; advised eating slowly and chewing foods thoroughly.  Nutritional Diagnosis:  [place]-2.2 Altered nutrition-related laboratory As related to diabetes.  As evidenced by recent HbA1C of 6.9%. [place]-3.3 Overweight/obesity As related to excess calories.  As evidenced by patient with current BMI of 36.25, and patient report of dietary intake.  Intervention: Instruction as noted above.   Set goals with input from patient.    Will call patient to schedule follow-up   Patient had to leave appointment early due to GI upset.   Education Materials given:  . General diet guidelines for Diabetes . Plate Planner with food lists . Sample menus . Goals/ instructions   Learner/ who was taught:  . Patient   Level of understanding: Marland Kitchen Verbalizes/ demonstrates competency   Demonstrated degree of understanding via:   Teach back Learning barriers: . None  Willingness to learn/ readiness for change: . Acceptance, ready for change  Monitoring and Evaluation:  Dietary intake, exercise, BG control, and body weight      follow up: TBD

## 2018-10-22 NOTE — Patient Instructions (Signed)
   Consider keeping a food diary, writing down all you eat and portions.   Eat a small balanced meal 3x a day  Limit snacks to 2-3 per day

## 2018-10-28 ENCOUNTER — Other Ambulatory Visit: Payer: Self-pay | Admitting: Nurse Practitioner

## 2018-11-09 ENCOUNTER — Other Ambulatory Visit: Payer: Self-pay | Admitting: Family Medicine

## 2018-11-09 DIAGNOSIS — N6310 Unspecified lump in the right breast, unspecified quadrant: Secondary | ICD-10-CM

## 2018-11-10 ENCOUNTER — Telehealth: Payer: Self-pay | Admitting: Family Medicine

## 2018-11-10 NOTE — Telephone Encounter (Signed)
Patient does not need to check her fingerstick blood sugar more than one time a day I cannot approve more than one strip per day period She does not even need to check her sugar every single day if she does not want to; she has had diabetes for more than 6 months, is not on a sulfonylurea, and is not on insulin so it's not necessary

## 2018-11-11 DIAGNOSIS — R42 Dizziness and giddiness: Secondary | ICD-10-CM | POA: Diagnosis not present

## 2018-11-11 NOTE — Telephone Encounter (Signed)
She does not need to check her sugars every single day and she certainly does not need to check more than one time a day As I mentioned before, there is no need for frequent FSBS monitoring She can check once a day but insurance is not going to justify more than once a day period

## 2018-11-11 NOTE — Telephone Encounter (Signed)
Pt.notified

## 2018-11-11 NOTE — Telephone Encounter (Signed)
Patient states you placed her on metformin and her sugar runs high and if it is high she retest.  Pt wants you to call to discuss

## 2018-11-12 ENCOUNTER — Telehealth: Payer: Self-pay | Admitting: Family Medicine

## 2018-11-12 NOTE — Telephone Encounter (Signed)
Breast imaging was ordered on 11/09/2018 Please inform patient Make sure that gets scheduled (clarify if she calls or scheduling calls her)

## 2018-11-13 NOTE — Telephone Encounter (Signed)
notified

## 2018-11-25 ENCOUNTER — Other Ambulatory Visit: Payer: Self-pay | Admitting: Family Medicine

## 2018-11-25 ENCOUNTER — Ambulatory Visit (INDEPENDENT_AMBULATORY_CARE_PROVIDER_SITE_OTHER): Payer: Medicare Other | Admitting: Nurse Practitioner

## 2018-11-25 ENCOUNTER — Encounter: Payer: Self-pay | Admitting: Nurse Practitioner

## 2018-11-25 VITALS — BP 110/62 | HR 90 | Temp 99.3°F | Resp 16 | Ht 66.5 in | Wt 225.0 lb

## 2018-11-25 DIAGNOSIS — R0981 Nasal congestion: Secondary | ICD-10-CM | POA: Diagnosis not present

## 2018-11-25 DIAGNOSIS — R05 Cough: Secondary | ICD-10-CM | POA: Diagnosis not present

## 2018-11-25 DIAGNOSIS — R059 Cough, unspecified: Secondary | ICD-10-CM

## 2018-11-25 DIAGNOSIS — M5440 Lumbago with sciatica, unspecified side: Principal | ICD-10-CM

## 2018-11-25 DIAGNOSIS — G8929 Other chronic pain: Secondary | ICD-10-CM

## 2018-11-25 MED ORDER — FLUTICASONE PROPIONATE 50 MCG/ACT NA SUSP: 2.0000 | Freq: Every day | NASAL | 6 refills | Status: DC

## 2018-11-25 MED ORDER — BENZONATATE 100 MG PO CAPS: 100.0000 mg | ORAL_CAPSULE | Freq: Three times a day (TID) | ORAL | 0 refills | Status: DC | PRN

## 2018-11-25 MED ORDER — LINACLOTIDE 290 MCG PO CAPS: 290.0000 ug | ORAL_CAPSULE | Freq: Every day | ORAL | 0 refills | Status: DC

## 2018-11-25 NOTE — Telephone Encounter (Signed)
Linzess was just prescribed by Edgardo Roys, DNP earlier today

## 2018-11-25 NOTE — Progress Notes (Signed)
Name: LEE KALT   MRN: [id number]    DOB: [birth year]   Date:11/25/2018       Progress Note  Subjective  Chief Complaint  Chief Complaint  Patient presents with  . URI    Onset Sunday- nasal congestion, cough, chest tightness.    HPI  Patient endorses dry cough on Sunday and nasal congestion states cough has worsened and has had some chest congestion. Denies shortness of breath. States she felt feverish yesterday but never checked you. She is on methotrexate. Patient states symptoms have worsened today- specifically cough   Patient has tried OTC cold medications- nyquil.   Patient Active Problem List   Diagnosis Date Noted  . Morbid obesity ([place]) 09/29/2018  . Screening for cervical cancer 05/26/2018  . Preventative health care 05/26/2018  . Vaginal atrophy 01/01/2017  . Hyperlipidemia LDL goal <100 10/04/2016  . Medication monitoring encounter 03/16/2016  . Diverticula of colon 01/24/2016  . Hypertension goal BP (blood pressure) < 130/80 01/17/2016  . Impingement syndrome of right shoulder 01/03/2016  . Bursitis, trochanteric 11/22/2015  . Inversion, nipple 11/08/2015  . Diabetes mellitus type 2, controlled, without complications ([place]) 29/19/1660  . Rheumatoid arthritis involving both hands ([place]) 10/23/2015  . Benign neoplasm of descending colon   . Benign neoplasm of sigmoid colon   . Bipolar disorder, current episode depressed, moderate ([place]) 07/03/2015  . IBS (irritable bowel syndrome) 07/03/2015  . Insomnia, uncontrolled 07/03/2015  . GERD without esophagitis 07/03/2015  . Bilateral hip bursitis 07/03/2015    Past Medical History:  Diagnosis Date  . Bipolar 1 disorder ([place])   . Depression   . Diabetes mellitus without complication ([place])   . Diverticulosis   . GERD (gastroesophageal reflux disease)   . Heart murmur    followed by PCP  . Hepatitis    B - "not active"  . Hypertension goal BP (blood pressure) < 130/80 01/17/2016  . Insomnia   . Irritable  bowel syndrome   . Motion sickness    boats  . Obesity (BMI 30-39.9) 07/03/2015  . RA (rheumatoid arthritis) ([place])   . Sciatica     Past Surgical History:  Procedure Laterality Date  . BOWEL RESECTION    . CHOLECYSTECTOMY  1997   [place],   . COLONOSCOPY WITH PROPOFOL N/A 09/07/2015   Procedure: COLONOSCOPY WITH PROPOFOL;  Surgeon: Lucilla Lame, MD;  Location: [facility];  Service: Endoscopy;  Laterality: N/A;  . ESOPHAGOGASTRODUODENOSCOPY (EGD) WITH ESOPHAGEAL DILATION    . ESOPHAGOGASTRODUODENOSCOPY (EGD) WITH PROPOFOL N/A 09/07/2015   Procedure: ESOPHAGOGASTRODUODENOSCOPY (EGD) WITH PROPOFOL;  Surgeon: Lucilla Lame, MD;  Location: [facility];  Service: Endoscopy;  Laterality: N/A;  . PARTIAL COLECTOMY N/A 01/24/2016   Procedure: SIGMOID COLECTOMY;  Surgeon: Florene Glen, MD;  Location: [facility];  Service: General;  Laterality: N/A;  . POLYPECTOMY N/A 09/07/2015   Procedure: POLYPECTOMY;  Surgeon: Lucilla Lame, MD;  Location: [facility];  Service: Endoscopy;  Laterality: N/A;  . TUBAL LIGATION      Social History   Tobacco Use  . Smoking status: Former Smoker    Packs/day: 1.00    Years: 1.00    Pack years: 1.00    Types: Cigarettes    Last attempt to quit: 05/06/2015    Years since quitting: 3.5  . Smokeless tobacco: Never Used  . Tobacco comment: smoking cessation materials not required  Substance Use Topics  . Alcohol use: No    Alcohol/week: 0.0 standard drinks  Comment: rarely small glass of wine     Current Outpatient Medications:  .  aspirin 81 MG tablet, Take 81 mg by mouth daily., Disp: , Rfl:  .  cholecalciferol (VITAMIN D) 1000 units tablet, Take 1,000 Units by mouth daily., Disp: , Rfl:  .  fluticasone (FLONASE) 50 MCG/ACT nasal spray, Place 2 sprays into both nostrils daily., Disp: 16 g, Rfl: 6 .  folic acid (FOLVITE) 1 MG tablet, Take 1 mg by mouth daily., Disp: , Rfl:  .  linaclotide (LINZESS) 290 MCG CAPS capsule, Take 1  capsule (290 mcg total) by mouth daily., Disp: 90 capsule, Rfl: 0 .  metFORMIN (GLUCOPHAGE XR) 500 MG 24 hr tablet, One by mouth daily for one week, then two a day (for diabetes), Disp: 53 tablet, Rfl: 0 .  methotrexate 50 MG/2ML injection, Take 0.6 ml SQ once a week x 12 weeks 4 vials of 2 ml for 12 weeks, Disp: , Rfl:  .  Multiple Vitamins-Minerals (MULTIVITAMIN/EXTRA VITAMIN D3 PO), Take by mouth., Disp: , Rfl:  .  OLANZapine (ZYPREXA) 15 MG tablet, Take 15 mg by mouth at bedtime., Disp: , Rfl: 0 .  Omega-3 Fatty Acids (FISH OIL) 500 MG CAPS, Take by mouth., Disp: , Rfl:  .  traZODone (DESYREL) 150 MG tablet, Take 150 mg by mouth at bedtime., Disp: , Rfl: 0 .  ACCU-CHEK FASTCLIX LANCETS MISC, TEST BLOOD SUGAR TWICE DAILY, Disp: 102 each, Rfl: 0 .  ACCU-CHEK SMARTVIEW test strip, TEST ONCE DAILY, Disp: 100 each, Rfl: 0 .  atorvastatin (LIPITOR) 10 MG tablet, TAKE 1 TABLET BY MOUTH AT BEDTIME, Disp: 90 tablet, Rfl: 0 .  B-D INS SYR ULTRAFINE 1CC/31G 31G X 5/16" 1 ML MISC, use as directed TO INJECT METHOTREXATE, Disp: , Rfl: 1 .  benzonatate (TESSALON) 100 MG capsule, Take 1 capsule (100 mg total) by mouth 3 (three) times daily as needed for cough., Disp: 30 capsule, Rfl: 0 .  Blood Glucose Monitoring Suppl (ACCU-CHEK NANO SMARTVIEW) w/Device KIT, Use as directed once daily. LON 99, E11.9, Disp: 1 kit, Rfl: 0 .  gabapentin (NEURONTIN) 300 MG capsule, TAKE 1 CAPSULE BY MOUTH EVERY MORNING AND TAKE 1 CAPSULE AT NOON AND TAKE 2 CAPSULES AT BEDTIME, Disp: 360 capsule, Rfl: 0  Allergies  Allergen Reactions  . Ace Inhibitors Swelling    Angioedema (specifically from enalapril)  . Enalapril Swelling    angioedema  . Penicillins Hives and Swelling    Has patient had a PCN reaction causing immediate rash, facial/tongue/throat swelling, SOB or lightheadedness with hypotension: Yes Has patient had a PCN reaction causing severe rash involving mucus membranes or skin necrosis: No Has patient had a PCN  reaction that required hospitalization Yes Has patient had a PCN reaction occurring within the last 10 years: No If all of the above answers are "NO", then may proceed with Cephalosporin use.  . Statins Hives    ROS   No other specific complaints in a complete review of systems (except as listed in HPI above).  Objective  Vitals:   11/25/18 1400  BP: 110/62  Pulse: 90  Resp: 16  Temp: 99.3 F (37.4 C)  SpO2: 97%  Weight: 225 lb (102.1 kg)  Height: 5' 6.5" (1.689 m)    Body mass index is 35.77 kg/m.  Nursing Note and Vital Signs reviewed.  Physical Exam Vitals signs reviewed.  Constitutional:      Appearance: She is well-developed.  HENT:     Head: Normocephalic and atraumatic.  Right Ear: Ear canal and external ear normal. A middle ear effusion is present. There is no impacted cerumen.     Left Ear: Ear canal and external ear normal. A middle ear effusion is present. There is no impacted cerumen.     Nose: Congestion present.     Mouth/Throat:     Mouth: Mucous membranes are dry.     Pharynx: Posterior oropharyngeal erythema present. No oropharyngeal exudate.  Eyes:     Conjunctiva/sclera: Conjunctivae normal.     Pupils: Pupils are equal, round, and reactive to light.  Neck:     Musculoskeletal: Normal range of motion and neck supple.     Vascular: No carotid bruit.  Cardiovascular:     Rate and Rhythm: Normal rate and regular rhythm.     Heart sounds: Normal heart sounds.  Pulmonary:     Effort: Pulmonary effort is normal.     Breath sounds: Normal breath sounds.  Musculoskeletal: Normal range of motion.  Skin:    General: Skin is warm and dry.     Capillary Refill: Capillary refill takes less than 2 seconds.  Neurological:     Mental Status: She is alert and oriented to person, place, and time.     GCS: GCS eye subscore is 4. GCS verbal subscore is 5. GCS motor subscore is 6.     Sensory: No sensory deficit.  Psychiatric:        Mood and Affect:  Mood normal.        Speech: Speech normal.        Behavior: Behavior normal.        Thought Content: Thought content normal.        Judgment: Judgment normal.      No results found for this or any previous visit (from the past 48 hour(s)).  Assessment & Plan  1. Nasal congestion - fluticasone (FLONASE) 50 MCG/ACT nasal spray; Place 2 sprays into both nostrils daily.  Dispense: 16 g; Refill: 6  2. Cough - benzonatate (TESSALON) 100 MG capsule; Take 1 capsule (100 mg total) by mouth 3 (three) times daily as needed for cough.  Dispense: 30 capsule; Refill: 0  - Please try a neti pot - Use your humidifier - Take cough medicine as prescribed and take guaifenesin 650m every 12 hours - take flonase daily and daily antihistamine like Claritin if you want non-drowsy or benadryl if you want drowsy - Plenty of water and vitamin C and honey lemon tea. - discussed talking to provider about holding methotrexate.   -Red flags and when to present for emergency care or RTC including fever >101.13F, chest pain, shortness of breath, new/worsening/un-resolving symptoms, eviewed with patient at time of visit. Follow up and care instructions discussed and provided in AVS.

## 2018-11-25 NOTE — Patient Instructions (Signed)
-   Please try a neti pot - Use your humidifier - Take cough medicine as prescribed and take guaifenesin 600mg  every 12 hours - take flonase daily and daily antihistamine like Claritin if you want non-drowsy or benadryl if you want drowsy - Plenty of water and vitamin C and honey lemon tea.   You likely have a viral upper respiratory infection (URI). Antibiotics will not reduce the number of days you are ill or prevent you from getting bacterial rhinosinusitis. A URI can take up to 14 days to resolve, but typically last between 7-11 days. Your body is so smart and strong that it will be fighting this illness off for you but it is important that you drink plenty of fluids, rest. Cover your nose/mouth when you cough or sneeze and wash your hands well and often. Here are some helpful things you can use or pick up over the counter from the pharmacy to help with your symptoms:   For Fever/Pain: Acetaminophen every 6 hours as needed (maximum of 3000mg  a day). If you are still uncomfortable you can add ibuprofen OR naproxen  For coughing: try dextromethorphan for a cough suppressant, and/or a cool mist humidifier, lozenges  For sore throat: saline gargles, honey herbal tea, lozenges, throat spray  To dry out your nose: try an antihistamine like loratadine (non-sedating) or diphenhydramine (sedating) or others To relieve a stuffy nose: try an oral decongestant  Like pseudoephedrine if you are under the age of 59 and do not have high blood pressure, neti pot To make blowing your nose easier: guaifenesin   - If you develop worsening symptoms, shortness of breath, fatigue, fevers/chills please call us or get urgent medical attention

## 2018-12-08 ENCOUNTER — Ambulatory Visit
Admission: RE | Admit: 2018-12-08 | Discharge: 2018-12-08 | Disposition: A | Discharge status: Home / Self Care | Payer: Medicare Other | Source: Ambulatory Visit | Attending: Family Medicine | Admitting: Family Medicine

## 2018-12-08 DIAGNOSIS — N6310 Unspecified lump in the right breast, unspecified quadrant: Secondary | ICD-10-CM | POA: Insufficient documentation

## 2018-12-08 DIAGNOSIS — R928 Other abnormal and inconclusive findings on diagnostic imaging of breast: Secondary | ICD-10-CM | POA: Diagnosis not present

## 2018-12-08 DIAGNOSIS — N6311 Unspecified lump in the right breast, upper outer quadrant: Secondary | ICD-10-CM | POA: Diagnosis not present

## 2018-12-17 ENCOUNTER — Other Ambulatory Visit: Payer: Self-pay | Admitting: Nurse Practitioner

## 2018-12-24 ENCOUNTER — Encounter: Payer: Self-pay | Admitting: Nurse Practitioner

## 2018-12-24 ENCOUNTER — Ambulatory Visit (INDEPENDENT_AMBULATORY_CARE_PROVIDER_SITE_OTHER): Payer: Medicare Other | Admitting: Nurse Practitioner

## 2018-12-24 VITALS — BP 118/70 | HR 94 | Temp 98.9°F | Resp 16 | Ht 66.5 in | Wt 222.3 lb

## 2018-12-24 DIAGNOSIS — J0100 Acute maxillary sinusitis, unspecified: Secondary | ICD-10-CM

## 2018-12-24 DIAGNOSIS — J029 Acute pharyngitis, unspecified: Secondary | ICD-10-CM

## 2018-12-24 DIAGNOSIS — Z20828 Contact with and (suspected) exposure to other viral communicable diseases: Secondary | ICD-10-CM | POA: Diagnosis not present

## 2018-12-24 LAB — POCT INFLUENZA A/B
INFLUENZA B, POC: NEGATIVE
Influenza A, POC: NEGATIVE

## 2018-12-24 MED ORDER — AZITHROMYCIN 250 MG PO TABS: ORAL_TABLET | ORAL | 0 refills | Status: DC

## 2018-12-24 MED ORDER — MAGIC MOUTHWASH W/LIDOCAINE: 5.0000 mL | Freq: Three times a day (TID) | ORAL | 0 refills | Status: DC | PRN

## 2018-12-24 NOTE — Patient Instructions (Addendum)

## 2018-12-24 NOTE — Progress Notes (Signed)
Name: Elizabeth [name]   MRN: [id number]    DOB: Sep 05, 1966   Date:12/24/2018       Progress Note  Subjective  Chief Complaint  Chief Complaint  Patient presents with  . Sore Throat    lots of post nasal drip  . Nasal Congestion    clear mucus  . Facial Pain    right side  . Ear Fullness    predominantly on the right side    HPI  States has never felt better since last visit 3 weeks ago worsening sore throat and right side nasal congestion- copious amounts of nasal congestion and facial pressure.  Husband has flu and pneumonia- states has him quarantined. Denies fever, chills, shortness of breath, cough.   Patient Active Problem List   Diagnosis Date Noted  . Morbid obesity ([place]) 09/29/2018  . Screening for cervical cancer 05/26/2018  . Preventative health care 05/26/2018  . Vaginal atrophy 01/01/2017  . Hyperlipidemia LDL goal <100 10/04/2016  . Medication monitoring encounter 03/16/2016  . Diverticula of colon 01/24/2016  . Hypertension goal BP (blood pressure) < 130/80 01/17/2016  . Impingement syndrome of right shoulder 01/03/2016  . Bursitis, trochanteric 11/22/2015  . Inversion, nipple 11/08/2015  . Diabetes mellitus type 2, controlled, without complications ([place]) 78/24/2353  . Rheumatoid arthritis involving both hands ([place]) 10/23/2015  . Benign neoplasm of descending colon   . Benign neoplasm of sigmoid colon   . Bipolar disorder, current episode depressed, moderate ([place]) 07/03/2015  . IBS (irritable bowel syndrome) 07/03/2015  . Insomnia, uncontrolled 07/03/2015  . GERD without esophagitis 07/03/2015  . Bilateral hip bursitis 07/03/2015    Past Medical History:  Diagnosis Date  . Bipolar 1 disorder ([place])   . Depression   . Diabetes mellitus without complication ([place])   . Diverticulosis   . GERD (gastroesophageal reflux disease)   . Heart murmur    followed by PCP  . Hepatitis    B - "not active"  . Hypertension goal BP (blood pressure) < 130/80 01/17/2016   . Insomnia   . Irritable bowel syndrome   . Motion sickness    boats  . Obesity (BMI 30-39.9) 07/03/2015  . RA (rheumatoid arthritis) ([place])   . Sciatica     Past Surgical History:  Procedure Laterality Date  . BOWEL RESECTION    . CHOLECYSTECTOMY  1997   [place], [place]  . COLONOSCOPY WITH PROPOFOL N/A 09/07/2015   Procedure: COLONOSCOPY WITH PROPOFOL;  Surgeon: Lucilla Lame, MD;  Location: [facility];  Service: Endoscopy;  Laterality: N/A;  . ESOPHAGOGASTRODUODENOSCOPY (EGD) WITH ESOPHAGEAL DILATION    . ESOPHAGOGASTRODUODENOSCOPY (EGD) WITH PROPOFOL N/A 09/07/2015   Procedure: ESOPHAGOGASTRODUODENOSCOPY (EGD) WITH PROPOFOL;  Surgeon: Lucilla Lame, MD;  Location: [facility];  Service: Endoscopy;  Laterality: N/A;  . PARTIAL COLECTOMY N/A 01/24/2016   Procedure: SIGMOID COLECTOMY;  Surgeon: Florene Glen, MD;  Location: [facility];  Service: General;  Laterality: N/A;  . POLYPECTOMY N/A 09/07/2015   Procedure: POLYPECTOMY;  Surgeon: Lucilla Lame, MD;  Location: [facility];  Service: Endoscopy;  Laterality: N/A;  . TUBAL LIGATION      Social History   Tobacco Use  . Smoking status: Former Smoker    Packs/day: 1.00    Years: 1.00    Pack years: 1.00    Types: Cigarettes    Last attempt to quit: 05/06/2015    Years since quitting: 3.6  . Smokeless tobacco: Never Used  . Tobacco comment: smoking cessation materials  not required  Substance Use Topics  . Alcohol use: No    Alcohol/week: 0.0 standard drinks    Comment: rarely small glass of wine     Current Outpatient Medications:  .  ACCU-CHEK FASTCLIX LANCETS MISC, TEST BLOOD SUGAR TWICE DAILY, Disp: 102 each, Rfl: 0 .  ACCU-CHEK SMARTVIEW test strip, TEST ONCE DAILY, Disp: 100 each, Rfl: 0 .  aspirin 81 MG tablet, Take 81 mg by mouth daily., Disp: , Rfl:  .  atorvastatin (LIPITOR) 10 MG tablet, TAKE 1 TABLET BY MOUTH AT BEDTIME, Disp: 90 tablet, Rfl: 0 .  azithromycin (ZITHROMAX) 250 MG tablet, 2 tabs  today and 1 tab for the next 4 days, Disp: 6 tablet, Rfl: 0 .  B-D INS SYR ULTRAFINE 1CC/31G 31G X 5/16" 1 ML MISC, use as directed TO INJECT METHOTREXATE, Disp: , Rfl: 1 .  benzonatate (TESSALON) 100 MG capsule, Take 1 capsule (100 mg total) by mouth 3 (three) times daily as needed for cough., Disp: 30 capsule, Rfl: 0 .  Blood Glucose Monitoring Suppl (ACCU-CHEK NANO SMARTVIEW) w/Device KIT, Use as directed once daily. LON 99, E11.9, Disp: 1 kit, Rfl: 0 .  cholecalciferol (VITAMIN D) 1000 units tablet, Take 1,000 Units by mouth daily., Disp: , Rfl:  .  fluticasone (FLONASE) 50 MCG/ACT nasal spray, Place 2 sprays into both nostrils daily., Disp: 16 g, Rfl: 6 .  folic acid (FOLVITE) 1 MG tablet, Take 1 mg by mouth daily., Disp: , Rfl:  .  gabapentin (NEURONTIN) 300 MG capsule, TAKE 1 CAPSULE BY MOUTH EVERY MORNING AND TAKE 1 CAPSULE AT NOON AND TAKE 2 CAPSULES AT BEDTIME, Disp: 360 capsule, Rfl: 0 .  linaclotide (LINZESS) 290 MCG CAPS capsule, Take 1 capsule (290 mcg total) by mouth daily., Disp: 90 capsule, Rfl: 0 .  magic mouthwash w/lidocaine SOLN, Take 5 mLs by mouth 3 (three) times daily as needed for mouth pain., Disp: 100 mL, Rfl: 0 .  metFORMIN (GLUCOPHAGE XR) 500 MG 24 hr tablet, One by mouth daily for one week, then two a day (for diabetes), Disp: 53 tablet, Rfl: 0 .  methotrexate 50 MG/2ML injection, Take 0.6 ml SQ once a week x 12 weeks 4 vials of 2 ml for 12 weeks, Disp: , Rfl:  .  Multiple Vitamins-Minerals (MULTIVITAMIN/EXTRA VITAMIN D3 PO), Take by mouth., Disp: , Rfl:  .  OLANZapine (ZYPREXA) 15 MG tablet, Take 15 mg by mouth at bedtime., Disp: , Rfl: 0 .  Omega-3 Fatty Acids (FISH OIL) 500 MG CAPS, Take by mouth., Disp: , Rfl:  .  traZODone (DESYREL) 150 MG tablet, Take 150 mg by mouth at bedtime., Disp: , Rfl: 0  Allergies  Allergen Reactions  . Ace Inhibitors Swelling    Angioedema (specifically from enalapril)  . Enalapril Swelling    angioedema  . Penicillins Hives and  Swelling    Has patient had a PCN reaction causing immediate rash, facial/tongue/throat swelling, SOB or lightheadedness with hypotension: Yes Has patient had a PCN reaction causing severe rash involving mucus membranes or skin necrosis: No Has patient had a PCN reaction that required hospitalization Yes Has patient had a PCN reaction occurring within the last 10 years: No If all of the above answers are "NO", then may proceed with Cephalosporin use.  . Statins Hives    ROS   No other specific complaints in a complete review of systems (except as listed in HPI above).  Objective  Vitals:   12/24/18 1124  BP: 118/70  Pulse: (!) 108  Resp: 16  Temp: 98.9 F (37.2 C)  TempSrc: Oral  SpO2: 95%  Weight: 222 lb 4.8 oz (100.8 kg)  Height: 5' 6.5" (1.689 m)    Body mass index is 35.34 kg/m.  Nursing Note and Vital Signs reviewed.  Physical Exam     Results for orders placed or performed in visit on 12/24/18 (from the past 48 hour(s))  POCT Influenza A/B     Status: Normal   Collection Time: 12/24/18 11:31 AM  Result Value Ref Range   Influenza A, POC Negative Negative   Influenza B, POC Negative Negative    Assessment & Plan  1. Acute non-recurrent maxillary sinusitis Allergic to penicillin  - azithromycin (ZITHROMAX) 250 MG tablet; 2 tabs today and 1 tab for the next 4 days  Dispense: 6 tablet; Refill: 0  2. Exposure to the flu negative - POCT Influenza A/B  3. Sore throat - azithromycin (ZITHROMAX) 250 MG tablet; 2 tabs today and 1 tab for the next 4 days  Dispense: 6 tablet; Refill: 0 - magic mouthwash w/lidocaine SOLN; Take 5 mLs by mouth 3 (three) times daily as needed for mouth pain.  Dispense: 100 mL; Refill: 0    -Red flags and when to present for emergency care or RTC including fever >101.39F, chest pain, shortness of breath, new/worsening/un-resolving symptoms, reviewed with patient at time of visit. Follow up and care instructions discussed and  provided in AVS.

## 2018-12-31 ENCOUNTER — Ambulatory Visit (INDEPENDENT_AMBULATORY_CARE_PROVIDER_SITE_OTHER): Payer: Medicare Other | Admitting: Family Medicine

## 2018-12-31 ENCOUNTER — Encounter: Payer: Self-pay | Admitting: Family Medicine

## 2018-12-31 VITALS — BP 118/84 | HR 62 | Temp 98.0°F | Resp 12 | Ht 67.0 in | Wt 223.4 lb

## 2018-12-31 DIAGNOSIS — Z5181 Encounter for therapeutic drug level monitoring: Secondary | ICD-10-CM | POA: Diagnosis not present

## 2018-12-31 DIAGNOSIS — D125 Benign neoplasm of sigmoid colon: Secondary | ICD-10-CM

## 2018-12-31 DIAGNOSIS — E785 Hyperlipidemia, unspecified: Secondary | ICD-10-CM | POA: Diagnosis not present

## 2018-12-31 DIAGNOSIS — E119 Type 2 diabetes mellitus without complications: Secondary | ICD-10-CM | POA: Diagnosis not present

## 2018-12-31 DIAGNOSIS — E669 Obesity, unspecified: Secondary | ICD-10-CM

## 2018-12-31 DIAGNOSIS — I10 Essential (primary) hypertension: Secondary | ICD-10-CM | POA: Diagnosis not present

## 2018-12-31 NOTE — Progress Notes (Signed)
BP 118/84   Pulse 62   Temp 98 F (36.7 C)   Resp 12   Ht 5\' 7"  (1.702 m)   Wt 223 lb 6.4 oz (101.3 kg)   LMP 07/09/2014   SpO2 99%   BMI 34.99 kg/m    Subjective:    Patient ID: Elizabeth [name], female    DOB: October 03, 1966, 53 y.o.   MRN: [id number]  HPI: Elizabeth [name] is a 53 y.o. female  Chief Complaint  Patient presents with  . Follow-up    HPI Here for f/u  Type 2 diabetes; she thinks her numbers are going to be better; she has been better and watching her intake; checking FSBS once every other day; last two weeks, range has been between 109-124;  Last eye exam was Jan 2019; goes in Feb 2020; no eye damage; no foot problems Last urine was negativ (July 2019)  Lab Results  Component Value Date   HGBA1C 6.9 (A) 09/26/2018   Obesity; she would like to weigh 185 pounds; she weighed 150 pounds when she graduated from high school  HTN; controlled; not checking BP with my blessing; very little salt, very little; does use decongestants at times; no NSAIDs  Colonoscopy not due until 2022; no blood in the stool  Dyslipidemia; HDL was only 44; does walk; TG 156; not much fried food; last LDL 76  RA; usually takes MTX but off right now on ABX; joint pain is doing fine; Dr. Annalee Genta is rheumatologist  Depression screen [facility] 2/9 12/31/2018 12/24/2018 10/22/2018 09/29/2018 06/29/2018  Decreased Interest 0 0 0 0 0  Down, Depressed, Hopeless 0 0 0 0 0  PHQ - 2 Score 0 0 0 0 0  Altered sleeping 0 - - - -  Tired, decreased energy 0 - - - -  Change in appetite 0 - - - -  Feeling bad or failure about yourself  0 - - - -  Trouble concentrating 0 - - - -  Moving slowly or fidgety/restless 0 - - - -  Suicidal thoughts 0 - - - -  PHQ-9 Score 0 - - - -  Difficult doing work/chores Not difficult at all - - - -   Fall Risk  12/31/2018 12/24/2018 10/22/2018 09/29/2018 08/03/2018  Falls in the past year? 0 0 0 No No  Comment - - - - -  Number falls in past yr: 0 0 - - -  Injury  with Fall? 0 0 - - -  Comment - - - - -  Risk for fall due to : - - Impaired balance/gait - -  Risk for fall due to: Comment - - rheumatoid arthritis - -  Follow up - - - - -    Relevant past medical, surgical, family and social history reviewed Past Medical History:  Diagnosis Date  . Bipolar 1 disorder ([place])   . Depression   . Diabetes mellitus without complication ([place])   . Diverticulosis   . GERD (gastroesophageal reflux disease)   . Heart murmur    followed by PCP  . Hepatitis    B - "not active"  . Hypertension goal BP (blood pressure) < 130/80 01/17/2016  . Insomnia   . Irritable bowel syndrome   . Motion sickness    boats  . Obesity (BMI 30-39.9) 07/03/2015  . RA (rheumatoid arthritis) ([place])   . Sciatica    Past Surgical History:  Procedure Laterality Date  . BOWEL RESECTION    .  CHOLECYSTECTOMY  1997   [place], [place]  . COLONOSCOPY WITH PROPOFOL N/A 09/07/2015   Procedure: COLONOSCOPY WITH PROPOFOL;  Surgeon: Lucilla Lame, MD;  Location: [place];  Service: Endoscopy;  Laterality: N/A;  . ESOPHAGOGASTRODUODENOSCOPY (EGD) WITH ESOPHAGEAL DILATION    . ESOPHAGOGASTRODUODENOSCOPY (EGD) WITH PROPOFOL N/A 09/07/2015   Procedure: ESOPHAGOGASTRODUODENOSCOPY (EGD) WITH PROPOFOL;  Surgeon: Lucilla Lame, MD;  Location: [place];  Service: Endoscopy;  Laterality: N/A;  . PARTIAL COLECTOMY N/A 01/24/2016   Procedure: SIGMOID COLECTOMY;  Surgeon: Florene Glen, MD;  Location: [facility];  Service: General;  Laterality: N/A;  . POLYPECTOMY N/A 09/07/2015   Procedure: POLYPECTOMY;  Surgeon: Lucilla Lame, MD;  Location: [place];  Service: Endoscopy;  Laterality: N/A;  . TUBAL LIGATION     Family History  Problem Relation Age of Onset  . Hypertension Mother   . Diabetes Mother   . Arthritis/Rheumatoid Mother   . Hyperlipidemia Mother   . Alcohol abuse Father   . Esophageal varices Father   . Diverticulosis Maternal Aunt   . Diverticulosis Maternal  Grandmother   . Diabetes Maternal Grandmother   . Heart disease Maternal Grandmother   . Dementia Maternal Grandmother   . Diabetes Brother   . Kidney disease Brother   . Aneurysm Maternal Grandfather   . Diabetes Paternal Grandmother   . Healthy Brother   . Depression Daughter        bipolar disorder  . Depression Daughter        bipolar disorder  . Breast cancer Neg Hx    Social History   Tobacco Use  . Smoking status: Former Smoker    Packs/day: 1.00    Years: 1.00    Pack years: 1.00    Types: Cigarettes    Last attempt to quit: 05/06/2015    Years since quitting: 3.6  . Smokeless tobacco: Never Used  . Tobacco comment: smoking cessation materials not required  Substance Use Topics  . Alcohol use: No    Alcohol/week: 0.0 standard drinks    Comment: rarely small glass of wine  . Drug use: No     Office Visit from 12/31/2018 in [facility]  AUDIT-C Score  0      Interim medical history since last visit reviewed. Allergies and medications reviewed  Review of Systems Per HPI unless specifically indicated above     Objective:    BP 118/84   Pulse 62   Temp 98 F (36.7 C)   Resp 12   Ht 5\' 7"  (1.702 m)   Wt 223 lb 6.4 oz (101.3 kg)   LMP 07/09/2014   SpO2 99%   BMI 34.99 kg/m   Wt Readings from Last 3 Encounters:  12/31/18 223 lb 6.4 oz (101.3 kg)  12/24/18 222 lb 4.8 oz (100.8 kg)  11/25/18 225 lb (102.1 kg)    Physical Exam Constitutional:      General: She is not in acute distress.    Appearance: She is well-developed. She is not diaphoretic.  HENT:     Head: Normocephalic and atraumatic.  Eyes:     General: No scleral icterus. Neck:     Thyroid: No thyromegaly.  Cardiovascular:     Rate and Rhythm: Normal rate and regular rhythm.     Heart sounds: Normal heart sounds. No murmur.  Pulmonary:     Effort: Pulmonary effort is normal. No respiratory distress.     Breath sounds: Normal breath sounds. No wheezing.  Abdominal:      General: Bowel sounds are normal. There is no distension.     Palpations: Abdomen is soft.  Skin:    General: Skin is warm and dry.     Coloration: Skin is not pale.  Neurological:     Mental Status: She is alert.  Psychiatric:        Behavior: Behavior normal.        Thought Content: Thought content normal.        Judgment: Judgment normal.    Diabetic Foot Form - Detailed   Diabetic Foot Exam - detailed Diabetic Foot exam was performed with the following findings:  Yes 12/31/2018  9:22 AM  Visual Foot Exam completed.:  Yes  Pulse Foot Exam completed.:  Yes  Right Dorsalis Pedis:  Present Left Dorsalis Pedis:  Present  Sensory Foot Exam Completed.:  Yes Semmes-Weinstein Monofilament Test R Site 1-Great Toe:  Pos L Site 1-Great Toe:  Pos         Results for orders placed or performed in visit on 12/24/18  POCT Influenza A/B  Result Value Ref Range   Influenza A, POC Negative Negative   Influenza B, POC Negative Negative      Assessment & Plan:   Problem List Items Addressed This Visit      Cardiovascular and Mediastinum   Hypertension goal BP (blood pressure) < 130/80 (Chronic)    Controlled; avoid decons and NSAIDs        Digestive   Benign neoplasm of sigmoid colon    Colonoscopy UTD; no bleeding; due for next in 2022        Endocrine   Diabetes mellitus type 2, controlled, without complications ([place]) - Primary (Chronic)    Eye exam soon; foot exam by MD today; check A1c      Relevant Orders   Hemoglobin A1C   Lipid panel     Other   Obesity (BMI 30.0-34.9)    Encouraged weight loss; gradual and steady wins the race; see AVS; offered nutritionist referral, already went      Medication monitoring encounter    Check liver and kidneys      Relevant Orders   COMPLETE METABOLIC PANEL WITH GFR   Hyperlipidemia LDL goal <70    Limit saturated fats; limit egg yolks to no more than 3 per week; limit cheese; check lipids, goal LDL is less than 70        Relevant Orders   Lipid panel       Follow up plan: No follow-ups on file.  An after-visit summary was printed and given to the patient at [place].  Please see the patient instructions which may contain other information and recommendations beyond what is mentioned above in the assessment and plan.  No orders of the defined types were placed in this encounter.   Orders Placed This Encounter  Procedures  . Hemoglobin A1C  . Lipid panel  . COMPLETE METABOLIC PANEL WITH GFR

## 2018-12-31 NOTE — Assessment & Plan Note (Signed)
Controlled; avoid decons and NSAIDs

## 2018-12-31 NOTE — Patient Instructions (Addendum)
You will be due for follow-up breast imaging on or just after December 09, 2019 Copied from report: RECOMMENDATION: Bilateral diagnostic mammogram and right breast ultrasound in 1 year to complete 2 years of follow-up of the probably benign mass on the right.   Try to use PLAIN allergy or cold medicine without the decongestant Avoid: phenylephrine, phenylpropanolamine, and pseudoephredine  If you need something for aches or pains, try to use Tylenol (acetaminophen) instead of non-steroidals (which include Aleve, ibuprofen, Advil, Motrin, and naproxen); non-steroidals can cause long-term kidney damage and raise your blood pressure

## 2018-12-31 NOTE — Assessment & Plan Note (Signed)
Limit saturated fats; limit egg yolks to no more than 3 per week; limit cheese; check lipids, goal LDL is less than 70

## 2018-12-31 NOTE — Assessment & Plan Note (Addendum)
Encouraged weight loss; gradual and steady wins the race; see AVS; offered nutritionist referral, already went

## 2018-12-31 NOTE — Assessment & Plan Note (Signed)
Check liver and kidneys 

## 2018-12-31 NOTE — Assessment & Plan Note (Signed)
Eye exam soon; foot exam by MD today; check A1c

## 2018-12-31 NOTE — Assessment & Plan Note (Signed)
Colonoscopy UTD; no bleeding; due for next in 2022

## 2019-01-01 LAB — COMPLETE METABOLIC PANEL WITH GFR
AG Ratio: 1.4 (calc) (ref 1.0–2.5)
ALKALINE PHOSPHATASE (APISO): 91 U/L (ref 33–130)
ALT: 20 U/L (ref 6–29)
AST: 17 U/L (ref 10–35)
Albumin: 4.3 g/dL (ref 3.6–5.1)
BUN: 12 mg/dL (ref 7–25)
CO2: 27 mmol/L (ref 20–32)
Calcium: 10.1 mg/dL (ref 8.6–10.4)
Chloride: 106 mmol/L (ref 98–110)
Creat: 0.73 mg/dL (ref 0.50–1.05)
GFR, Est African American: 111 mL/min/{1.73_m2} (ref >=60)
GFR, Est Non African American: 95 mL/min/{1.73_m2} (ref >=60)
Globulin: 3 g/dL (calc) (ref 1.9–3.7)
Glucose, Bld: 136 mg/dL — ABNORMAL HIGH (ref 65–99)
Potassium: 4.7 mmol/L (ref 3.5–5.3)
Sodium: 141 mmol/L (ref 135–146)
Total Bilirubin: 0.3 mg/dL (ref 0.2–1.2)
Total Protein: 7.3 g/dL (ref 6.1–8.1)

## 2019-01-01 LAB — HEMOGLOBIN A1C
Hgb A1c MFr Bld: 6.9 % of total Hgb — ABNORMAL HIGH (ref <5.7)
MEAN PLASMA GLUCOSE: 151 (calc)
eAG (mmol/L): 8.4 (calc)

## 2019-01-01 LAB — LIPID PANEL
Cholesterol: 125 mg/dL (ref <200)
HDL: 39 mg/dL — ABNORMAL LOW (ref >50)
LDL Cholesterol (Calc): 66 mg/dL (calc)
NON-HDL CHOLESTEROL (CALC): 86 mg/dL (ref <130)
Total CHOL/HDL Ratio: 3.2 (calc) (ref <5.0)
Triglycerides: 122 mg/dL (ref <150)

## 2019-01-05 DIAGNOSIS — M0609 Rheumatoid arthritis without rheumatoid factor, multiple sites: Secondary | ICD-10-CM | POA: Diagnosis not present

## 2019-01-05 DIAGNOSIS — Z79899 Other long term (current) drug therapy: Secondary | ICD-10-CM | POA: Diagnosis not present

## 2019-02-04 ENCOUNTER — Other Ambulatory Visit: Payer: Self-pay | Admitting: Nurse Practitioner

## 2019-02-05 DIAGNOSIS — M0609 Rheumatoid arthritis without rheumatoid factor, multiple sites: Secondary | ICD-10-CM | POA: Diagnosis not present

## 2019-02-05 DIAGNOSIS — Z79899 Other long term (current) drug therapy: Secondary | ICD-10-CM | POA: Diagnosis not present

## 2019-02-18 ENCOUNTER — Other Ambulatory Visit: Payer: Self-pay | Admitting: Family Medicine

## 2019-02-18 DIAGNOSIS — M5440 Lumbago with sciatica, unspecified side: Principal | ICD-10-CM

## 2019-02-18 DIAGNOSIS — G8929 Other chronic pain: Secondary | ICD-10-CM

## 2019-02-24 ENCOUNTER — Other Ambulatory Visit: Payer: Self-pay | Admitting: Nurse Practitioner

## 2019-03-08 ENCOUNTER — Ambulatory Visit: Payer: Self-pay | Admitting: *Deleted

## 2019-03-08 MED ORDER — AZELASTINE HCL 0.1 % NA SOLN: 2.0000 | Freq: Two times a day (BID) | NASAL | 12 refills | Status: DC

## 2019-03-08 NOTE — Telephone Encounter (Signed)
Stop fluticasone Start new nasal spray Use OTC loratidine daily (withOUT the decongestant) Thank you

## 2019-03-08 NOTE — Telephone Encounter (Signed)
Summary: Allergies   Pt called and stated that she is taking flonase for her allergies and stated that this is not working. Pt would like a call back regarding what else she can take to help with allergies. Please advise      Patient is taking Flonase and zyrtec and she states she is still having cough and itchy eyes. Patient wants to know if PCP has any other suggestions for allergy treatment at this time.  Reason for Disposition . [1] Taking antihistamines > 2 days AND [2] nasal allergy symptoms interfere with sleep, school, or work  Answer Assessment - Initial Assessment Questions 1. SYMPTOM: "What's the main symptom you're concerned about?" (e.g., runny nose, stuffiness, sneezing, itching)     Sinus drainage and eye irritation 2. SEVERITY: "How bad is it?" "What does it keep you from doing?" (e.g., sleeping, working)      Patient is having trouble at night- and in the morning- she is using medication 3. EYES: "Are the eyes also red, watery, and itchy?"      Eyes are stinging and itching 4. TRIGGER: "What pollen or other allergic substance do you think is causing the symptoms?"      pollen 5. TREATMENT: "What medicine are you using?" "What medicine worked best in the past?"     Zyrtec and Flonase 6. OTHER SYMPTOMS: "Do you have any other symptoms?" (e.g., coughing, difficulty breathing, wheezing)     Cough itchy eyes, sinus drainage 7. PREGNANCY: "Is there any chance you are pregnant?" "When was your last menstrual period?"     n/a  Protocols used: NASAL ALLERGIES (HAY FEVER)-A-AH

## 2019-03-08 NOTE — Addendum Note (Signed)
Addended by: Tienna Bienkowski, Satira Anis on: 03/08/2019 10:07 AM   Modules accepted: Orders

## 2019-03-08 NOTE — Telephone Encounter (Signed)
Pt.notified

## 2019-03-28 ENCOUNTER — Other Ambulatory Visit: Payer: Self-pay | Admitting: Family Medicine

## 2019-03-30 ENCOUNTER — Ambulatory Visit (INDEPENDENT_AMBULATORY_CARE_PROVIDER_SITE_OTHER): Payer: Medicare Other | Admitting: Nurse Practitioner

## 2019-03-30 ENCOUNTER — Encounter: Payer: Self-pay | Admitting: Nurse Practitioner

## 2019-03-30 ENCOUNTER — Other Ambulatory Visit: Payer: Self-pay

## 2019-03-30 DIAGNOSIS — L301 Dyshidrosis [pompholyx]: Secondary | ICD-10-CM

## 2019-03-30 MED ORDER — TRIAMCINOLONE ACETONIDE 0.5 % EX OINT: 1.0000 "application " | TOPICAL_OINTMENT | Freq: Two times a day (BID) | CUTANEOUS | 1 refills | Status: DC

## 2019-03-30 NOTE — Progress Notes (Signed)
Virtual Visit via Video Note  I connected with Elizabeth [name] on 03/30/19 at  1:20 PM EDT by a video enabled telemedicine application and verified that I am speaking with the correct person using two identifiers.   Staff discussed the limitations of evaluation and management by telemedicine and the availability of in person appointments. The patient expressed understanding and agreed to proceed.  Patient location: home  My location: work office Other people present: none  HPI  Patient endorses bilateral rash on hand started about 3-4 weeks. States she was planting flower and painting around the house and noticed her hands were cracking and had a rash with small bumps. It is has hard and scaly. Areas are not itchy, states just sore and skin peels- lots of dry skin.  She has been washing hands a lot more. Has never had this before. Uses a lot of bleach to keep things clean  Using antibiotic soap, neosporin and Vaseline. Last night use anti-fungal cream.  No known bug/tick bites, fevers, chills, nausea.  PHQ2/9: Depression screen [facility] 2/9 03/30/2019 12/31/2018 12/24/2018 10/22/2018 09/29/2018  Decreased Interest 0 0 0 0 0  Down, Depressed, Hopeless 0 0 0 0 0  PHQ - 2 Score 0 0 0 0 0  Altered sleeping 0 0 - - -  Tired, decreased energy 0 0 - - -  Change in appetite 0 0 - - -  Feeling bad or failure about yourself  0 0 - - -  Trouble concentrating 0 0 - - -  Moving slowly or fidgety/restless 0 0 - - -  Suicidal thoughts 0 0 - - -  PHQ-9 Score 0 0 - - -  Difficult doing work/chores Not difficult at all Not difficult at all - - -     PHQ reviewed. Negative  Patient Active Problem List   Diagnosis Date Noted  . Obesity (BMI 30.0-34.9) 09/29/2018  . Screening for cervical cancer 05/26/2018  . Preventative health care 05/26/2018  . Vaginal atrophy 01/01/2017  . Hyperlipidemia LDL goal <70 10/04/2016  . Medication monitoring encounter 03/16/2016  . Diverticula of colon 01/24/2016  .  Hypertension goal BP (blood pressure) < 130/80 01/17/2016  . Impingement syndrome of right shoulder 01/03/2016  . Bursitis, trochanteric 11/22/2015  . Inversion, nipple 11/08/2015  . Diabetes mellitus type 2 in obese ([place]) 10/23/2015  . Rheumatoid arthritis involving both hands ([place]) 10/23/2015  . Benign neoplasm of descending colon   . Benign neoplasm of sigmoid colon   . Bipolar disorder, current episode depressed, moderate ([place]) 07/03/2015  . IBS (irritable bowel syndrome) 07/03/2015  . Insomnia, uncontrolled 07/03/2015  . GERD without esophagitis 07/03/2015  . Bilateral hip bursitis 07/03/2015    Past Medical History:  Diagnosis Date  . Bipolar 1 disorder ([place])   . Depression   . Diabetes mellitus without complication ([place])   . Diverticulosis   . GERD (gastroesophageal reflux disease)   . Heart murmur    followed by PCP  . Hepatitis    B - "not active"  . Hypertension goal BP (blood pressure) < 130/80 01/17/2016  . Insomnia   . Irritable bowel syndrome   . Motion sickness    boats  . Obesity (BMI 30-39.9) 07/03/2015  . RA (rheumatoid arthritis) ([place])   . Sciatica     Past Surgical History:  Procedure Laterality Date  . BOWEL RESECTION    . CHOLECYSTECTOMY  1997   [place], [place]  . COLONOSCOPY WITH PROPOFOL N/A 09/07/2015   Procedure: COLONOSCOPY  WITH PROPOFOL;  Surgeon: Lucilla Lame, MD;  Location: [place];  Service: Endoscopy;  Laterality: N/A;  . ESOPHAGOGASTRODUODENOSCOPY (EGD) WITH ESOPHAGEAL DILATION    . ESOPHAGOGASTRODUODENOSCOPY (EGD) WITH PROPOFOL N/A 09/07/2015   Procedure: ESOPHAGOGASTRODUODENOSCOPY (EGD) WITH PROPOFOL;  Surgeon: Lucilla Lame, MD;  Location: [place];  Service: Endoscopy;  Laterality: N/A;  . PARTIAL COLECTOMY N/A 01/24/2016   Procedure: SIGMOID COLECTOMY;  Surgeon: Florene Glen, MD;  Location: [facility];  Service: General;  Laterality: N/A;  . POLYPECTOMY N/A 09/07/2015   Procedure: POLYPECTOMY;  Surgeon: Lucilla Lame,  MD;  Location: [place];  Service: Endoscopy;  Laterality: N/A;  . TUBAL LIGATION      Social History   Tobacco Use  . Smoking status: Former Smoker    Packs/day: 1.00    Years: 1.00    Pack years: 1.00    Types: Cigarettes    Last attempt to quit: 05/06/2015    Years since quitting: 3.9  . Smokeless tobacco: Never Used  . Tobacco comment: smoking cessation materials not required  Substance Use Topics  . Alcohol use: No    Alcohol/week: 0.0 standard drinks    Comment: rarely small glass of wine     Current Outpatient Medications:  .  aspirin 81 MG tablet, Take 81 mg by mouth daily., Disp: , Rfl:  .  atorvastatin (LIPITOR) 10 MG tablet, TAKE 1 TABLET BY MOUTH AT BEDTIME, Disp: 90 tablet, Rfl: 0 .  cholecalciferol (VITAMIN D) 1000 units tablet, Take 1,000 Units by mouth daily., Disp: , Rfl:  .  folic acid (FOLVITE) 1 MG tablet, Take 1 mg by mouth daily., Disp: , Rfl:  .  gabapentin (NEURONTIN) 300 MG capsule, TAKE 1 CAPSULE BY MOUTH EVERY MORNING, TAKE 1 CAPSULE EVERY DAY AT NOON, THEN TAKE 2 CAPSULES EVERY NIGHT AT BEDTIME, Disp: 360 capsule, Rfl: 0 .  LINZESS 290 MCG CAPS capsule, TAKE 1 CAPSULE(290 MCG) BY MOUTH DAILY, Disp: 90 capsule, Rfl: 0 .  metFORMIN (GLUCOPHAGE XR) 500 MG 24 hr tablet, One by mouth daily for one week, then two a day (for diabetes), Disp: 53 tablet, Rfl: 0 .  methotrexate 50 MG/2ML injection, Take 0.6 ml SQ once a week x 12 weeks 4 vials of 2 ml for 12 weeks, Disp: , Rfl:  .  Multiple Vitamins-Minerals (MULTIVITAMIN/EXTRA VITAMIN D3 PO), Take by mouth., Disp: , Rfl:  .  OLANZapine (ZYPREXA) 15 MG tablet, Take 15 mg by mouth at bedtime., Disp: , Rfl: 0 .  traZODone (DESYREL) 150 MG tablet, Take 150 mg by mouth at bedtime., Disp: , Rfl: 0 .  ACCU-CHEK FASTCLIX LANCETS MISC, TEST BLOOD SUGAR TWICE DAILY, Disp: 102 each, Rfl: 0 .  azelastine (ASTELIN) 0.1 % nasal spray, Place 2 sprays into both nostrils 2 (two) times daily. Use in each nostril as  directed (Patient not taking: Reported on 03/30/2019), Disp: 30 mL, Rfl: 12 .  B-D INS SYR ULTRAFINE 1CC/31G 31G X 5/16" 1 ML MISC, use as directed TO INJECT METHOTREXATE, Disp: , Rfl: 1 .  benzonatate (TESSALON) 100 MG capsule, Take 1 capsule (100 mg total) by mouth 3 (three) times daily as needed for cough. (Patient not taking: Reported on 12/31/2018), Disp: 30 capsule, Rfl: 0 .  Blood Glucose Monitoring Suppl (ACCU-CHEK NANO SMARTVIEW) w/Device KIT, Use as directed once daily. LON 99, E11.9, Disp: 1 kit, Rfl: 0 .  glucose blood (ACCU-CHEK SMARTVIEW) test strip, Test blood sugar once a day if desired; LON 99 months, Dx  E11.69, Disp: 100 each, Rfl: 1 .  Omega-3 Fatty Acids (FISH OIL) 500 MG CAPS, Take by mouth., Disp: , Rfl:   Allergies  Allergen Reactions  . Ace Inhibitors Swelling    Angioedema (specifically from enalapril)  . Enalapril Swelling    angioedema  . Penicillins Hives and Swelling    Has patient had a PCN reaction causing immediate rash, facial/tongue/throat swelling, SOB or lightheadedness with hypotension: Yes Has patient had a PCN reaction causing severe rash involving mucus membranes or skin necrosis: No Has patient had a PCN reaction that required hospitalization Yes Has patient had a PCN reaction occurring within the last 10 years: No If all of the above answers are "NO", then may proceed with Cephalosporin use.  . Statins Hives    ROS    No other specific complaints in a complete review of systems (except as listed in HPI above).  Objective  There were no vitals filed for this visit.   There is no height or weight on file to calculate BMI.    Physical Exam   Constitutional: Patient appears well-developed and well-nourished. No distress.  HENT: Head: Normocephalic and atraumatic. Cardiovascular: Normal rate Pulmonary/Chest: Effort normal  Musculoskeletal: Normal range of motion,  Neurological: he is alert and oriented to person, place, and time. speech  and gait are normal.  Skin: red rash on bilateral palms, dry thick flaky skin, small bumps noted to palmar surface. Does not extend or dorsal surface or to wrists.  Psychiatric: Patient has a normal mood and affect. behavior is normal. Judgment and thought content normal.    Assessment & Plan  1. Dyshidrotic eczema Discussed home care, avoid extreme temperatures, wear gloves when appropriate, avoid chemicals, frequent washing, cover in vaseline. If unimproved or worsening refer to derm.  - triamcinolone ointment (KENALOG) 0.5 %; Apply 1 application topically 2 (two) times daily.  Dispense: 60 g; Refill: 1     Follow Up Instructions:   2 weeks if unimproved  I discussed the assessment and treatment plan with the patient. The patient was provided an opportunity to ask questions and all were answered. The patient agreed with the plan and demonstrated an understanding of the instructions.   The patient was advised to call back or seek an in-person evaluation if the symptoms worsen or if the condition fails to improve as anticipated.  I provided 16 minutes of non-face-to-face time during this encounter.   Fredderick Severance, NP

## 2019-04-01 ENCOUNTER — Ambulatory Visit (INDEPENDENT_AMBULATORY_CARE_PROVIDER_SITE_OTHER): Payer: Medicare Other

## 2019-04-01 DIAGNOSIS — Z0000 Encounter for general adult medical examination without abnormal findings: Secondary | ICD-10-CM | POA: Diagnosis not present

## 2019-04-01 NOTE — Patient Instructions (Signed)
Elizabeth [name] , Thank you for taking time to come for your Medicare Wellness Visit. I appreciate your ongoing commitment to your health goals. Please review the following plan we discussed and let me know if I can assist you in the future.   Screening recommendations/referrals: Colonoscopy: done 09/07/15. Repeat in 2021 Mammogram: done 12/08/18 Recommended yearly ophthalmology/optometry visit for glaucoma screening and checkup Recommended yearly dental visit for hygiene and checkup  Vaccinations: Influenza vaccine: done 07/14/18 Pneumococcal vaccine: done 11/04/17 Tdap vaccine: done 11/04/17 Shingles vaccine: done 01/08/18    Advanced directives: Please bring a copy of your health care power of attorney and living will to the office at your convenience.  Conditions/risks identified: Recommend increasing physical activity to 150 minutes per week.   Next appointment: Please follow up in one year for your Medicare Annual Wellness visit.    Preventive Care 40-64 Years, Female Preventive care refers to lifestyle choices and visits with your health care provider that can promote health and wellness. What does preventive care include?  A yearly physical exam. This is also called an annual well check.  Dental exams once or twice a year.  Routine eye exams. Ask your health care provider how often you should have your eyes checked.  Personal lifestyle choices, including:  Daily care of your teeth and gums.  Regular physical activity.  Eating a healthy diet.  Avoiding tobacco and drug use.  Limiting alcohol use.  Practicing safe sex.  Taking low-dose aspirin daily starting at age 50.  Taking vitamin and mineral supplements as recommended by your health care provider. What happens during an annual well check? The services and screenings done by your health care provider during your annual well check will depend on your age, overall health, lifestyle risk factors, and family history of  disease. Counseling  Your health care provider may ask you questions about your:  Alcohol use.  Tobacco use.  Drug use.  Emotional well-being.  Home and relationship well-being.  Sexual activity.  Eating habits.  Work and work Statistician.  Method of birth control.  Menstrual cycle.  Pregnancy history. Screening  You may have the following tests or measurements:  Height, weight, and BMI.  Blood pressure.  Lipid and cholesterol levels. These may be checked every 5 years, or more frequently if you are over 9 years old.  Skin check.  Lung cancer screening. You may have this screening every year starting at age 109 if you have a 30-pack-year history of smoking and currently smoke or have quit within the past 15 years.  Fecal occult blood test (FOBT) of the stool. You may have this test every year starting at age 66.  Flexible sigmoidoscopy or colonoscopy. You may have a sigmoidoscopy every 5 years or a colonoscopy every 10 years starting at age 53.  Hepatitis C blood test.  Hepatitis B blood test.  Sexually transmitted disease (STD) testing.  Diabetes screening. This is done by checking your blood sugar (glucose) after you have not eaten for a while (fasting). You may have this done every 1-3 years.  Mammogram. This may be done every 1-2 years. Talk to your health care provider about when you should start having regular mammograms. This may depend on whether you have a family history of breast cancer.  BRCA-related cancer screening. This may be done if you have a family history of breast, ovarian, tubal, or peritoneal cancers.  Pelvic exam and Pap test. This may be done every 3 years starting at age 38.  Starting at age 77, this may be done every 5 years if you have a Pap test in combination with an HPV test.  Bone density scan. This is done to screen for osteoporosis. You may have this scan if you are at high risk for osteoporosis. Discuss your test results,  treatment options, and if necessary, the need for more tests with your health care provider. Vaccines  Your health care provider may recommend certain vaccines, such as:  Influenza vaccine. This is recommended every year.  Tetanus, diphtheria, and acellular pertussis (Tdap, Td) vaccine. You may need a Td booster every 10 years.  Zoster vaccine. You may need this after age 40.  Pneumococcal 13-valent conjugate (PCV13) vaccine. You may need this if you have certain conditions and were not previously vaccinated.  Pneumococcal polysaccharide (PPSV23) vaccine. You may need one or two doses if you smoke cigarettes or if you have certain conditions. Talk to your health care provider about which screenings and vaccines you need and how often you need them. This information is not intended to replace advice given to you by your health care provider. Make sure you discuss any questions you have with your health care provider. Document Released: 12/22/2015 Document Revised: 08/14/2016 Document Reviewed: 09/26/2015 Elsevier Interactive Patient Education  2017 [employer] Prevention in the Home Falls can cause injuries. They can happen to people of all ages. There are many things you can do to make your home safe and to help prevent falls. What can I do on the outside of my home?  Regularly fix the edges of walkways and driveways and fix any cracks.  Remove anything that might make you trip as you walk through a door, such as a raised step or threshold.  Trim any bushes or trees on the path to your home.  Use bright outdoor lighting.  Clear any walking paths of anything that might make someone trip, such as rocks or tools.  Regularly check to see if handrails are loose or broken. Make sure that both sides of any steps have handrails.  Any raised decks and porches should have guardrails on the edges.  Have any leaves, snow, or ice cleared regularly.  Use sand or salt on walking  paths during winter.  Clean up any spills in your garage right away. This includes oil or grease spills. What can I do in the bathroom?  Use night lights.  Install grab bars by the toilet and in the tub and shower. Do not use towel bars as grab bars.  Use non-skid mats or decals in the tub or shower.  If you need to sit down in the shower, use a plastic, non-slip stool.  Keep the floor dry. Clean up any water that spills on the floor as soon as it happens.  Remove soap buildup in the tub or shower regularly.  Attach bath mats securely with double-sided non-slip rug tape.  Do not have throw rugs and other things on the floor that can make you trip. What can I do in the bedroom?  Use night lights.  Make sure that you have a light by your bed that is easy to reach.  Do not use any sheets or blankets that are too big for your bed. They should not hang down onto the floor.  Have a firm chair that has side arms. You can use this for support while you get dressed.  Do not have throw rugs and other things on the  floor that can make you trip. What can I do in the kitchen?  Clean up any spills right away.  Avoid walking on wet floors.  Keep items that you use a lot in easy-to-reach places.  If you need to reach something above you, use a strong step stool that has a grab bar.  Keep electrical cords out of the way.  Do not use floor polish or wax that makes floors slippery. If you must use wax, use non-skid floor wax.  Do not have throw rugs and other things on the floor that can make you trip. What can I do with my stairs?  Do not leave any items on the stairs.  Make sure that there are handrails on both sides of the stairs and use them. Fix handrails that are broken or loose. Make sure that handrails are as long as the stairways.  Check any carpeting to make sure that it is firmly attached to the stairs. Fix any carpet that is loose or worn.  Avoid having throw rugs at  the top or bottom of the stairs. If you do have throw rugs, attach them to the floor with carpet tape.  Make sure that you have a light switch at the top of the stairs and the bottom of the stairs. If you do not have them, ask someone to add them for you. What else can I do to help prevent falls?  Wear shoes that:  Do not have high heels.  Have rubber bottoms.  Are comfortable and fit you well.  Are closed at the toe. Do not wear sandals.  If you use a stepladder:  Make sure that it is fully opened. Do not climb a closed stepladder.  Make sure that both sides of the stepladder are locked into place.  Ask someone to hold it for you, if possible.  Clearly mark and make sure that you can see:  Any grab bars or handrails.  First and last steps.  Where the edge of each step is.  Use tools that help you move around (mobility aids) if they are needed. These include:  Canes.  Walkers.  Scooters.  Crutches.  Turn on the lights when you go into a dark area. Replace any light bulbs as soon as they burn out.  Set up your furniture so you have a clear path. Avoid moving your furniture around.  If any of your floors are uneven, fix them.  If there are any pets around you, be aware of where they are.  Review your medicines with your doctor. Some medicines can make you feel dizzy. This can increase your chance of falling. Ask your doctor what other things that you can do to help prevent falls. This information is not intended to replace advice given to you by your health care provider. Make sure you discuss any questions you have with your health care provider. Document Released: 09/21/2009 Document Revised: 05/02/2016 Document Reviewed: 12/30/2014 Elsevier Interactive Patient Education  2017 [employer].

## 2019-04-01 NOTE — Progress Notes (Signed)
Subjective:   Elizabeth [name] is a 53 y.o. female who presents for Medicare Annual (Subsequent) preventive examination.  This visit is being conducted via virtual visit due to the COVID-19 pandemic. This patient has given me verbal consent via video chat to conduct this visit. Some vital signs may be absent or patient reported.     Review of Systems:   Cardiac Risk Factors include: diabetes mellitus;dyslipidemia;hypertension;obesity (BMI >30kg/m2)     Objective:     Vitals: LMP 07/09/2014   There is no height or weight on file to calculate BMI.  Advanced Directives 04/01/2019 05/07/2018 03/27/2018 12/05/2017 08/26/2017 07/11/2017 07/02/2017  Does Patient Have a Medical Advance Directive? Yes _0  No  Type of Advance Directive Living will;Healthcare Power of Attorney - - - - - -  Does patient want to make changes to medical advance directive? - - Yes (MAU/Ambulatory/Procedural Areas - Information given) - - - -  Copy of [place] in Chart? No - copy requested - - - - - -  Would patient like information on creating a medical advance directive? - No - Patient declined - No - Patient declined - - -    Tobacco Social History   Tobacco Use  Smoking Status Former Smoker  . Packs/day: 1.00  . Years: 1.00  . Pack years: 1.00  . Types: Cigarettes  . Last attempt to quit: 05/06/2015  . Years since quitting: 3.9  Smokeless Tobacco Never Used  Tobacco Comment   smoking cessation materials not required     Counseling given: Not Answered Comment: smoking cessation materials not required   Clinical Intake:  Pre-visit preparation completed: Yes  Pain : No/denies pain     Nutritional Risks: None Diabetes: Yes CBG done?: No Did pt. bring in CBG monitor from home?: No   Nutrition Risk Assessment:  Has the patient had any N/V/D within the last 2 months?  No  Does the patient have any non-healing wounds?  No  Has the patient had any unintentional weight  loss or weight gain?  No   Diabetes:  Is the patient diabetic?  Yes  If diabetic, was a CBG obtained today?  No  Did the patient bring in their glucometer from home?  No  How often do you monitor your CBG's? Every other day last fasting blood sugar 120.   Financial Strains and Diabetes Management:  Are you having any financial strains with the device, your supplies or your medication? No .  Does the patient want to be seen by Chronic Care Management for management of their diabetes?  No  Would the patient like to be referred to a Nutritionist or for Diabetic Management?  No   Diabetic Exams:  Diabetic Eye Exam: Completed 12/22/17. Overdue for diabetic eye exam. Pt has been advised about the importance in completing this exam. Per pt her appt earlier this year was canceled. Awaiting end of Covid 19 to reschedule.  Diabetic Foot Exam: Completed 12/31/18.   How often do you need to have someone help you when you read instructions, pamphlets, or other written materials from your doctor or pharmacy?: 1 - Never  Interpreter Needed?: No  Information entered by :: Clemetine Marker LPN  Past Medical History:  Diagnosis Date  . Bipolar 1 disorder ([place])   . Depression   . Diabetes mellitus without complication ([place])   . Diverticulosis   . GERD (gastroesophageal reflux disease)   . Heart murmur  followed by PCP  . Hepatitis    B - "not active"  . Hypertension goal BP (blood pressure) < 130/80 01/17/2016  . Insomnia   . Irritable bowel syndrome   . Motion sickness    boats  . Obesity (BMI 30-39.9) 07/03/2015  . RA (rheumatoid arthritis) ([place])   . Sciatica    Past Surgical History:  Procedure Laterality Date  . BOWEL RESECTION    . CHOLECYSTECTOMY  1997   [place], [place]  . COLONOSCOPY WITH PROPOFOL N/A 09/07/2015   Procedure: COLONOSCOPY WITH PROPOFOL;  Surgeon: Lucilla Lame, MD;  Location: [facility];  Service: Endoscopy;  Laterality: N/A;  . ESOPHAGOGASTRODUODENOSCOPY (EGD)  WITH ESOPHAGEAL DILATION    . ESOPHAGOGASTRODUODENOSCOPY (EGD) WITH PROPOFOL N/A 09/07/2015   Procedure: ESOPHAGOGASTRODUODENOSCOPY (EGD) WITH PROPOFOL;  Surgeon: Lucilla Lame, MD;  Location: [facility];  Service: Endoscopy;  Laterality: N/A;  . PARTIAL COLECTOMY N/A 01/24/2016   Procedure: SIGMOID COLECTOMY;  Surgeon: Florene Glen, MD;  Location: [facility];  Service: General;  Laterality: N/A;  . POLYPECTOMY N/A 09/07/2015   Procedure: POLYPECTOMY;  Surgeon: Lucilla Lame, MD;  Location: [facility];  Service: Endoscopy;  Laterality: N/A;  . TUBAL LIGATION     Family History  Problem Relation Age of Onset  . Hypertension Mother   . Diabetes Mother   . Arthritis/Rheumatoid Mother   . Hyperlipidemia Mother   . Alcohol abuse Father   . Esophageal varices Father   . Diverticulosis Maternal Aunt   . Diverticulosis Maternal Grandmother   . Diabetes Maternal Grandmother   . Heart disease Maternal Grandmother   . Dementia Maternal Grandmother   . Diabetes Brother   . Kidney disease Brother   . Aneurysm Maternal Grandfather   . Diabetes Paternal Grandmother   . Healthy Brother   . Depression Daughter        bipolar disorder  . Depression Daughter        bipolar disorder  . Breast cancer Neg Hx    Social History   Socioeconomic History  . Marital status: Married    Spouse name: Mortimer Fries  . Number of children: 3  . Years of education: some college  . Highest education level: 12th grade  Occupational History  . Occupation: Disabled  Social Needs  . Financial resource strain: Not hard at all  . Food insecurity:    Worry: Never true    Inability: Never true  . Transportation needs:    Medical: No    Non-medical: No  Tobacco Use  . Smoking status: Former Smoker    Packs/day: 1.00    Years: 1.00    Pack years: 1.00    Types: Cigarettes    Last attempt to quit: 05/06/2015    Years since quitting: 3.9  . Smokeless tobacco: Never Used  . Tobacco comment: smoking  cessation materials not required  Substance and Sexual Activity  . Alcohol use: No    Alcohol/week: 0.0 standard drinks    Comment: rarely small glass of wine  . Drug use: No  . Sexual activity: Yes    Birth control/protection: None  Lifestyle  . Physical activity:    Days per week: 0 days    Minutes per session: 0 min  . Stress: Only a little  Relationships  . Social connections:    Talks on phone: More than three times a week    Gets together: Three times a week    Attends religious service: More than 4 times  per year    Active member of club or organization: No    Attends meetings of clubs or organizations: Never    Relationship status: Married  Other Topics Concern  . Not on file  Social History Narrative  . Not on file    Outpatient Encounter Medications as of 04/01/2019  Medication Sig  . ACCU-CHEK FASTCLIX LANCETS MISC TEST BLOOD SUGAR TWICE DAILY  . aspirin 81 MG tablet Take 81 mg by mouth daily.  Marland Kitchen atorvastatin (LIPITOR) 10 MG tablet TAKE 1 TABLET BY MOUTH AT BEDTIME  . B-D INS SYR ULTRAFINE 1CC/31G 31G X 5/16" 1 ML MISC use as directed TO INJECT METHOTREXATE  . Blood Glucose Monitoring Suppl (ACCU-CHEK NANO SMARTVIEW) w/Device KIT Use as directed once daily. LON 99, E11.9  . cholecalciferol (VITAMIN D) 1000 units tablet Take 1,000 Units by mouth daily.  . folic acid (FOLVITE) 1 MG tablet Take 1 mg by mouth daily.  Marland Kitchen gabapentin (NEURONTIN) 300 MG capsule TAKE 1 CAPSULE BY MOUTH EVERY MORNING, TAKE 1 CAPSULE EVERY DAY AT NOON, THEN TAKE 2 CAPSULES EVERY NIGHT AT BEDTIME  . glucose blood (ACCU-CHEK SMARTVIEW) test strip Test blood sugar once a day if desired; LON 99 months, Dx E11.69  . LINZESS 290 MCG CAPS capsule TAKE 1 CAPSULE(290 MCG) BY MOUTH DAILY  . metFORMIN (GLUCOPHAGE XR) 500 MG 24 hr tablet One by mouth daily for one week, then two a day (for diabetes)  . methotrexate 50 MG/2ML injection Take 0.6 ml SQ once a week x 12 weeks 4 vials of 2 ml for 12 weeks  .  Multiple Vitamins-Minerals (MULTIVITAMIN/EXTRA VITAMIN D3 PO) Take by mouth.  . OLANZapine (ZYPREXA) 15 MG tablet Take 15 mg by mouth at bedtime.  . traZODone (DESYREL) 150 MG tablet Take 150 mg by mouth at bedtime.  . triamcinolone ointment (KENALOG) 0.5 % Apply 1 application topically 2 (two) times daily.  Marland Kitchen azelastine (ASTELIN) 0.1 % nasal spray Place 2 sprays into both nostrils 2 (two) times daily. Use in each nostril as directed (Patient not taking: Reported on 04/01/2019)  . [DISCONTINUED] benzonatate (TESSALON) 100 MG capsule Take 1 capsule (100 mg total) by mouth 3 (three) times daily as needed for cough. (Patient not taking: Reported on 12/31/2018)  . [DISCONTINUED] Omega-3 Fatty Acids (FISH OIL) 500 MG CAPS Take by mouth.   No facility-administered encounter medications on file as of 04/01/2019.     Activities of Daily Living In your present state of health, do you have any difficulty performing the following activities: 04/01/2019 03/30/2019  Hearing? N N  Comment declines hearing aids -  Vision? N N  Comment wears glasses -  Difficulty concentrating or making decisions? N N  Walking or climbing stairs? N N  Dressing or bathing? N N  Doing errands, shopping? N N  Preparing Food and eating ? N -  Using the Toilet? N -  In the past six months, have you accidently leaked urine? N -  Do you have problems with loss of bowel control? N -  Managing your Medications? N -  Managing your Finances? N -  Housekeeping or managing your Housekeeping? N -  Some recent data might be hidden    Patient Care Team: Lada, Satira Anis, MD as PCP - General (Family Medicine) Marlowe Sax, MD as Consulting Physician (Internal Medicine)    Assessment:   This is a routine wellness examination for Elizabeth [name].  Exercise Activities and Dietary recommendations Current Exercise Habits: The patient does not  participate in regular exercise at present, Exercise limited by: None identified  Goals    .  DIET - reduce carb intake     Recommend to decrease portion sizes by eating 3 small healthy meals and at least 2 healthy snacks per day.       Fall Risk Fall Risk  04/01/2019 03/30/2019 12/31/2018 12/24/2018 10/22/2018  Falls in the past year? 0 0 0 0 0  Comment - - - - -  Number falls in past yr: 0 - 0 0 -  Injury with Fall? 0 - 0 0 -  Comment - - - - -  Risk for fall due to : - - - - Impaired balance/gait  Risk for fall due to: Comment - - - - rheumatoid arthritis  Follow up Falls prevention discussed - - - -    FALL RISK PREVENTION PERTAINING TO THE HOME:  Any stairs in or around the home? No  If so, do they handrails? No   Home free of loose throw rugs in walkways, pet beds, electrical cords, etc? Yes  Adequate lighting in your home to reduce risk of falls? Yes   ASSISTIVE DEVICES UTILIZED TO PREVENT FALLS:  Life alert? No  Use of a cane, walker or w/c? No  Grab bars in the bathroom? Yes  Shower chair or bench in shower? No  Elevated toilet seat or a handicapped toilet? No   DME ORDERS:  DME order needed?  No   TIMED UP AND GO:  Was the test performed? Yes .  Length of time to ambulate 10 feet: 5 sec.   GAIT:  Appearance of gait: Gait stead-fast and without the use of an assistive device.   Education: Fall risk prevention has been discussed.  Intervention(s) required? No    Depression Screen PHQ 2/9 Scores 04/01/2019 03/30/2019 12/31/2018 12/24/2018  PHQ - 2 Score 2 0 0 0  PHQ- 9 Score 8 0 0 -     Cognitive Function     6CIT Screen 04/01/2019 03/27/2018  What Year? 0 points 0 points  What month? 0 points 0 points  What time? 0 points 0 points  Count back from 20 0 points 0 points  Months in reverse 0 points 0 points  Repeat phrase 0 points 0 points  Total Score 0 0    Immunization History  Administered Date(s) Administered  . Hepatitis A, Adult 11/12/2017, 05/12/2018  . Influenza,inj,Quad PF,6+ Mos 08/06/2017, 07/14/2018  . Influenza-Unspecified  08/06/2017  . Pneumococcal Polysaccharide-23 11/04/2017  . Td 11/04/2017  . Tdap 11/29/2015, 11/04/2017  . Zoster Recombinat (Shingrix) 01/08/2018, 04/01/2018    Qualifies for Shingles Vaccine? Yes  Shingrix series completed.  Tdap: Up to date  Flu Vaccine: Up to date   Pneumococcal Vaccine: Up to date   Screening Tests Health Maintenance  Topic Date Due  . OPHTHALMOLOGY EXAM  12/22/2018  . URINE MICROALBUMIN  06/30/2019  . HEMOGLOBIN A1C  07/01/2019  . INFLUENZA VACCINE  07/10/2019  . MAMMOGRAM  12/09/2019  . FOOT EXAM  01/01/2020  . COLONOSCOPY  09/06/2020  . PAP SMEAR-Modifier  05/26/2021  . TETANUS/TDAP  11/05/2027  . PNEUMOCOCCAL POLYSACCHARIDE VACCINE AGE 58-64 HIGH RISK  Completed  . HIV Screening  Completed    Cancer Screenings:  Colorectal Screening: Completed 09/07/15. Repeat every 5 years;   Mammogram: Completed 12/08/18. Repeat every year.  Lung Cancer Screening: (Low Dose CT Chest recommended if Age 46-80 years, 30 pack-year currently smoking OR have quit w/in 15years.) does  not qualify.    Additional Screening:  Hepatitis C Screening: does qualify; postponed  Vision Screening: Recommended annual ophthalmology exams for early detection of glaucoma and other disorders of the eye. Is the patient up to date with their annual eye exam?  No  Who is the provider or what is the name of the office in which the pt attends annual eye exams? [facility] Screening: Recommended annual dental exams for proper oral hygiene  Community Resource Referral:  CRR required this visit?  No      Plan:      I have personally reviewed and addressed the Medicare Annual Wellness questionnaire and have noted the following in the patient's chart:  A. Medical and social history B. Use of alcohol, tobacco or illicit drugs  C. Current medications and supplements D. Functional ability and status E.  Nutritional status F.  Physical activity G. Advance  directives H. List of other physicians I.  Hospitalizations, surgeries, and ER visits in previous 12 months J.  [place] such as hearing and vision if needed, cognitive and depression L. Referrals and appointments   In addition, I have reviewed and discussed with patient certain preventive protocols, quality metrics, and best practice recommendations. A written personalized care plan for preventive services as well as general preventive health recommendations were provided to patient.   Signed,  Clemetine Marker, LPN Nurse Health Advisor   Nurse Notes: pt doing well and appreciative of virtual visit today. She has had some emotional stress lately due to husband getting laid off of work at the end of March but she is feeling better.

## 2019-04-03 ENCOUNTER — Other Ambulatory Visit: Payer: Self-pay | Admitting: Nurse Practitioner

## 2019-05-11 DIAGNOSIS — Z79899 Other long term (current) drug therapy: Secondary | ICD-10-CM | POA: Diagnosis not present

## 2019-05-11 DIAGNOSIS — M0609 Rheumatoid arthritis without rheumatoid factor, multiple sites: Secondary | ICD-10-CM | POA: Diagnosis not present

## 2019-05-12 ENCOUNTER — Telehealth: Payer: Medicare Other | Admitting: Nurse Practitioner

## 2019-05-12 DIAGNOSIS — L309 Dermatitis, unspecified: Secondary | ICD-10-CM

## 2019-05-12 NOTE — Progress Notes (Signed)
Based on what you shared with me it looks like you have dermatitis,that should be evaluated in a face to face office visit. According to your chart Dr. Cecil Cranker saw you at the end of [facility] and prescribe steroid cream. According to their not, if this did not help you were to be referred to dermatology. You will need to contact there office to ger dermatology referral.   NOTE: If you entered your credit card information for this eVisit, you will not be charged. You may see a "hold" on your card for the $30 but that hold will drop off and you will not have a charge processed.  If you are having a true medical emergency please call 911.  If you need an urgent face to face visit, [facility] has four urgent care centers for your convenience.    Your e-visit answers were reviewed by a board certified advanced clinical practitioner to complete your personal care plan.

## 2019-05-17 ENCOUNTER — Ambulatory Visit (INDEPENDENT_AMBULATORY_CARE_PROVIDER_SITE_OTHER): Payer: Medicare Other | Admitting: Nurse Practitioner

## 2019-05-17 ENCOUNTER — Other Ambulatory Visit: Payer: Self-pay | Admitting: Family Medicine

## 2019-05-17 ENCOUNTER — Other Ambulatory Visit: Payer: Self-pay

## 2019-05-17 ENCOUNTER — Other Ambulatory Visit: Payer: Self-pay | Admitting: Nurse Practitioner

## 2019-05-17 ENCOUNTER — Encounter: Payer: Self-pay | Admitting: Nurse Practitioner

## 2019-05-17 VITALS — BP 118/68 | HR 90 | Temp 98.2°F | Resp 14 | Ht 67.0 in | Wt 225.9 lb

## 2019-05-17 DIAGNOSIS — M05741 Rheumatoid arthritis with rheumatoid factor of right hand without organ or systems involvement: Secondary | ICD-10-CM

## 2019-05-17 DIAGNOSIS — Z9109 Other allergy status, other than to drugs and biological substances: Secondary | ICD-10-CM

## 2019-05-17 DIAGNOSIS — M05742 Rheumatoid arthritis with rheumatoid factor of left hand without organ or systems involvement: Secondary | ICD-10-CM

## 2019-05-17 DIAGNOSIS — J0101 Acute recurrent maxillary sinusitis: Secondary | ICD-10-CM | POA: Diagnosis not present

## 2019-05-17 DIAGNOSIS — G5603 Carpal tunnel syndrome, bilateral upper limbs: Secondary | ICD-10-CM

## 2019-05-17 DIAGNOSIS — I10 Essential (primary) hypertension: Secondary | ICD-10-CM

## 2019-05-17 DIAGNOSIS — G8929 Other chronic pain: Secondary | ICD-10-CM

## 2019-05-17 DIAGNOSIS — R21 Rash and other nonspecific skin eruption: Secondary | ICD-10-CM | POA: Diagnosis not present

## 2019-05-17 MED ORDER — DOXYCYCLINE HYCLATE 100 MG PO TABS: 100.0000 mg | ORAL_TABLET | Freq: Two times a day (BID) | ORAL | 0 refills | Status: DC

## 2019-05-17 MED ORDER — MONTELUKAST SODIUM 10 MG PO TABS: 10.0000 mg | ORAL_TABLET | Freq: Every day | ORAL | 0 refills | Status: DC

## 2019-05-17 NOTE — Patient Instructions (Addendum)
- Cera ve unscented thick ointment  Dyshidrotic Eczema Dyshidrotic eczema (pompholyx) is a type of eczema that causes very itchy (pruritic), fluid-filled blisters (vesicles) to form on the hands and feet. It can affect people of any age, but is more common before the age of 53. There is no cure, but treatment and certain lifestyle changes can help relieve symptoms. What are the causes? The cause of this condition is not known. What increases the risk? You are more likely to develop this condition if:  You wash your hands frequently.  You have a personal history or family history of eczema, allergies, asthma, or hay fever.  You are allergic to metals such as nickel or cobalt.  You work with cement.  You smoke. What are the signs or symptoms? Symptoms of this condition may affect the hands, feet, or both. Symptoms may come and go (recur), and may include:  Severe itching, which may happen before blisters appear.  Blisters. These may form suddenly. ? In the early stages, blisters may form near the fingertips. ? In severe cases, blisters may grow to large blister masses (bullae). ? Blisters resolve in 2-3 weeks without bursting. This is followed by a dry phase in which itching eases.  Pain and swelling.  Cracks or long, narrow openings (fissures) in the skin.  Severe dryness.  Ridges on the nails. How is this diagnosed? This condition may be diagnosed based on:  A physical exam.  Your symptoms.  Your medical history.  Skin scrapings to rule out a fungal infection.  Testing a swab of fluid for bacteria (culture).  Removing and checking a small piece of skin (biopsy) in order to test for infection or to rule out other conditions.  Skin patch tests. These tests involve taking patches that contain possible allergens and placing them on your back. Your health care provider will wait a few days and then check to see if an allergic reaction occurred. These tests may be done if  your health care provider suspects allergic reactions, or to rule out other types of eczema. You may be referred to a health care provider who specializes in the skin (dermatologist) to help diagnose and treat this condition. How is this treated? There is no cure for this condition, but treatment can help relieve symptoms. Depending on how many blisters you have and how severe they are, your health care provider may suggest:  Avoiding allergens, irritants, or triggers that worsen symptoms. This may involve lifestyle changes such as: ? Using different lotions or soaps. ? Avoiding hot weather or places that will cause you to sweat a lot. ? Managing stress with coping techniques such as relaxation and exercise, and asking for help when you need it. ? Diet changes as recommended by your health care provider.  Using a clean, damp towel (cool compress) to relieve symptoms.  Soaking in a bath that contains a type of salt that relieves irritation (aluminum acetate soaks).  Medicine taken by mouth to reduce itching (oral antihistamines).  Medicine applied to the skin to reduce swelling and irritation (topical corticosteroids).  Medicine that reduces the activity of the body's disease-fighting system (immunosuppressants) to treat inflammation. This may be given in severe cases.  Antibiotic medicines to treat bacterial infection.  Light therapy (phototherapy). This involves shining ultraviolet (UV) light on affected skin in order to reduce itchiness and inflammation. Follow these instructions at home: Bathing and skin care   Wash skin gently. After bathing or washing your hands, pat your skin  dry. Avoid rubbing your skin.  Remove all jewelry before bathing. If the skin under the jewelry stays wet, blisters may form or get worse.  Apply cool compresses as told by your health care provider: ? Soak a clean towel in cool water. ? Wring out excess water until towel is damp. ? Place the towel over  affected skin. Leave the towel on for 20 minutes at a time, 2-3 times a day.  Use mild soaps, cleansers, and lotions that do not contain dyes, perfumes, or other irritants.  Keep your skin hydrated. To do this: ? Avoid very hot water. Take lukewarm baths or showers. ? Apply moisturizer within three minutes of bathing. This locks in moisture. Medicines  Take and apply over-the-counter and prescription medicines only as told by your health care provider.  If you were prescribed antibiotic medicine, take or apply it as told by your health care provider. Do not stop using the antibiotic even if you start to feel better. General instructions  Identify and avoid triggers and allergens.  Keep fingernails short to avoid breaking open the skin while scratching.  Use waterproof gloves to protect your hands when doing work that keeps your hands wet for a long time.  Wear socks to keep your feet dry.  Do not use any products that contain nicotine or tobacco, such as cigarettes and e-cigarettes. If you need help quitting, ask your health care provider.  Keep all follow-up visits as told by your health care provider. This is important. Contact a health care provider if:  You have symptoms that do not go away.  You have signs of infection, such as: ? Crusting, pus, or a bad smell. ? More redness, swelling, or pain. ? Increased warmth in the affected area. Summary  Dyshidrotic eczema (pompholyx) is a type of eczema that causes very itchy (pruritic), fluid-filled blisters (vesicles) to form on the hands and feet.  The cause of this condition is not known.  There is no cure for this condition, but treatment can help relieve symptoms. Treatment depends on how many blisters you have and how severe they are.  Use mild soaps, cleansers, and lotions that do not contain dyes, perfumes, or other irritants. Keep your skin hydrated. This information is not intended to replace advice given to you by  your health care provider. Make sure you discuss any questions you have with your health care provider. Document Released: 04/10/2017 Document Revised: 04/10/2017 Document Reviewed: 04/10/2017 Elsevier Interactive Patient Education  2019 [employer]  Sinusitis, Adult Sinusitis is inflammation of your sinuses. Sinuses are hollow spaces in the bones around your face. Your sinuses are located:  Around your eyes.  In the middle of your forehead.  Behind your nose.  In your cheekbones. Mucus normally drains out of your sinuses. When your nasal tissues become inflamed or swollen, mucus can become trapped or blocked. This allows bacteria, viruses, and fungi to grow, which leads to infection. Most infections of the sinuses are caused by a virus. Sinusitis can develop quickly. It can last for up to 4 weeks (acute) or for more than 12 weeks (chronic). Sinusitis often develops after a cold. What are the causes? This condition is caused by anything that creates swelling in the sinuses or stops mucus from draining. This includes:  Allergies.  Asthma.  Infection from bacteria or viruses.  Deformities or blockages in your nose or sinuses.  Abnormal growths in the nose (nasal polyps).  Pollutants, such as chemicals or irritants in the  air.  Infection from fungi (rare). What increases the risk? You are more likely to develop this condition if you:  Have a weak body defense system (immune system).  Do a lot of swimming or diving.  Overuse nasal sprays.  Smoke. What are the signs or symptoms? The main symptoms of this condition are pain and a feeling of pressure around the affected sinuses. Other symptoms include:  Stuffy nose or congestion.  Thick drainage from your nose.  Swelling and warmth over the affected sinuses.  Headache.  Upper toothache.  A cough that may get worse at night.  Extra mucus that collects in the throat or the back of the nose (postnasal drip).   Decreased sense of smell and taste.  Fatigue.  A fever.  Sore throat.  Bad breath. How is this diagnosed? This condition is diagnosed based on:  Your symptoms.  Your medical history.  A physical exam.  Tests to find out if your condition is acute or chronic. This may include: ? Checking your nose for nasal polyps. ? Viewing your sinuses using a device that has a light (endoscope). ? Testing for allergies or bacteria. ? Imaging tests, such as an MRI or CT scan. In rare cases, a bone biopsy may be done to rule out more serious types of fungal sinus disease. How is this treated? Treatment for sinusitis depends on the cause and whether your condition is chronic or acute.  If caused by a virus, your symptoms should go away on their own within 10 days. You may be given medicines to relieve symptoms. They include: ? Medicines that shrink swollen nasal passages (topical intranasal decongestants). ? Medicines that treat allergies (antihistamines). ? A spray that eases inflammation of the nostrils (topical intranasal corticosteroids). ? Rinses that help get rid of thick mucus in your nose (nasal saline washes).  If caused by bacteria, your health care provider may recommend waiting to see if your symptoms improve. Most bacterial infections will get better without antibiotic medicine. You may be given antibiotics if you have: ? A severe infection. ? A weak immune system.  If caused by narrow nasal passages or nasal polyps, you may need to have surgery. Follow these instructions at home: Medicines  Take, use, or apply over-the-counter and prescription medicines only as told by your health care provider. These may include nasal sprays.  If you were prescribed an antibiotic medicine, take it as told by your health care provider. Do not stop taking the antibiotic even if you start to feel better. Hydrate and humidify   Drink enough fluid to keep your urine pale yellow. Staying  hydrated will help to thin your mucus.  Use a cool mist humidifier to keep the humidity level in your home above 50%.  Inhale steam for 10-15 minutes, 3-4 times a day, or as told by your health care provider. You can do this in the bathroom while a hot shower is running.  Limit your exposure to cool or dry air. Rest  Rest as much as possible.  Sleep with your head raised (elevated).  Make sure you get enough sleep each night. General instructions   Apply a warm, moist washcloth to your face 3-4 times a day or as told by your health care provider. This will help with discomfort.  Wash your hands often with soap and water to reduce your exposure to germs. If soap and water are not available, use hand sanitizer.  Do not smoke. Avoid being around people who are  smoking (secondhand smoke).  Keep all follow-up visits as told by your health care provider. This is important. Contact a health care provider if:  You have a fever.  Your symptoms get worse.  Your symptoms do not improve within 10 days. Get help right away if:  You have a severe headache.  You have persistent vomiting.  You have severe pain or swelling around your face or eyes.  You have vision problems.  You develop confusion.  Your neck is stiff.  You have trouble breathing. Summary  Sinusitis is soreness and inflammation of your sinuses. Sinuses are hollow spaces in the bones around your face.  This condition is caused by nasal tissues that become inflamed or swollen. The swelling traps or blocks the flow of mucus. This allows bacteria, viruses, and fungi to grow, which leads to infection.  If you were prescribed an antibiotic medicine, take it as told by your health care provider. Do not stop taking the antibiotic even if you start to feel better.  Keep all follow-up visits as told by your health care provider. This is important. This information is not intended to replace advice given to you by your  health care provider. Make sure you discuss any questions you have with your health care provider. Document Released: 11/25/2005 Document Revised: 04/27/2018 Document Reviewed: 04/27/2018 Elsevier Interactive Patient Education  2019 [employer].

## 2019-05-17 NOTE — Progress Notes (Signed)
Name: Elizabeth [name]   MRN: [id number]    DOB: October 18, 1966   Date:05/17/2019       Progress Note  Subjective  Chief Complaint  Chief Complaint  Patient presents with  . Rash    Bilateral hands and feet  . Sinusitis    Headache, pressure, drainage.     HPI   Patient states rash on bilateral hands and feet have not improved. No blisters noted today but states they recently opened up, hands and feet still sore. She has been using topical steroid twice a day in addition to Vaseline and A&D creams. She does wash her hands and sanitize a lot but has been using a natural sanitizer with aloe. Using gloves when gardening.  Patient has bilateral hand rheumatoid arthritis and carpal tunnel syndrome- She sees Dr. Meda Coffee- rheum for RA- is taking methotrexate and folic acid. She does not want surgery for her carpal tunnel- had EMG in 2019 for confirmation.  Sinus pressure and pain worse on the right maxillary- intermittently for the past month. States stopped flonase a few months ago because was it wasn't helping, and was instructed to take azelastine, and is taking zyrtec and benadryl for the past 3 months with minimal relief. States she has environmental allergies and nasal congestion with PND that has not improved with anti-histamines.   Hypertension Patient is on no medications at this time and is diet controlled.  Moderately compliant with low-salt diet.  Denies chest pain, headaches, blurry vision.   PHQ2/9: Depression screen [facility] 2/9 05/17/2019 04/01/2019 03/30/2019 12/31/2018 12/24/2018  Decreased Interest 0 1 0 0 0  Down, Depressed, Hopeless 0 1 0 0 0  PHQ - 2 Score 0 2 0 0 0  Altered sleeping 0 3 0 0 -  Tired, decreased energy 0 0 0 0 -  Change in appetite 0 3 0 0 -  Feeling bad or failure about yourself  0 0 0 0 -  Trouble concentrating 0 0 0 0 -  Moving slowly or fidgety/restless 0 0 0 0 -  Suicidal thoughts 0 0 0 0 -  PHQ-9 Score 0 8 0 0 -  Difficult doing work/chores Not difficult at all  Not difficult at all Not difficult at all Not difficult at all -  Some recent data might be hidden     PHQ reviewed. Negative  Patient Active Problem List   Diagnosis Date Noted  . Obesity (BMI 30.0-34.9) 09/29/2018  . Screening for cervical cancer 05/26/2018  . Preventative health care 05/26/2018  . Vaginal atrophy 01/01/2017  . Hyperlipidemia LDL goal <70 10/04/2016  . Medication monitoring encounter 03/16/2016  . Diverticula of colon 01/24/2016  . Hypertension goal BP (blood pressure) < 130/80 01/17/2016  . Bilateral carpal tunnel syndrome 01/03/2016  . Bursitis, trochanteric 11/22/2015  . Inversion, nipple 11/08/2015  . Diabetes mellitus type 2 in obese ([place]) 10/23/2015  . Rheumatoid arthritis involving both hands ([place]) 10/23/2015  . Benign neoplasm of descending colon   . Benign neoplasm of sigmoid colon   . Bipolar disorder, current episode depressed, moderate ([place]) 07/03/2015  . IBS (irritable bowel syndrome) 07/03/2015  . Insomnia, uncontrolled 07/03/2015  . GERD without esophagitis 07/03/2015  . Bilateral hip bursitis 07/03/2015    Past Medical History:  Diagnosis Date  . Bipolar 1 disorder ([place])   . Depression   . Diabetes mellitus without complication ([place])   . Diverticulosis   . GERD (gastroesophageal reflux disease)   . Heart murmur    followed  by PCP  . Hepatitis    B - "not active"  . Hypertension goal BP (blood pressure) < 130/80 01/17/2016  . Insomnia   . Irritable bowel syndrome   . Motion sickness    boats  . Obesity (BMI 30-39.9) 07/03/2015  . RA (rheumatoid arthritis) ([place])   . Sciatica     Past Surgical History:  Procedure Laterality Date  . BOWEL RESECTION    . CHOLECYSTECTOMY  1997   [place], [place]  . COLONOSCOPY WITH PROPOFOL N/A 09/07/2015   Procedure: COLONOSCOPY WITH PROPOFOL;  Surgeon: Lucilla Lame, MD;  Location: [facility];  Service: Endoscopy;  Laterality: N/A;  . ESOPHAGOGASTRODUODENOSCOPY (EGD) WITH ESOPHAGEAL DILATION     . ESOPHAGOGASTRODUODENOSCOPY (EGD) WITH PROPOFOL N/A 09/07/2015   Procedure: ESOPHAGOGASTRODUODENOSCOPY (EGD) WITH PROPOFOL;  Surgeon: Lucilla Lame, MD;  Location: [facility];  Service: Endoscopy;  Laterality: N/A;  . PARTIAL COLECTOMY N/A 01/24/2016   Procedure: SIGMOID COLECTOMY;  Surgeon: Florene Glen, MD;  Location: [facility];  Service: General;  Laterality: N/A;  . POLYPECTOMY N/A 09/07/2015   Procedure: POLYPECTOMY;  Surgeon: Lucilla Lame, MD;  Location: [facility];  Service: Endoscopy;  Laterality: N/A;  . TUBAL LIGATION      Social History   Tobacco Use  . Smoking status: Former Smoker    Packs/day: 1.00    Years: 1.00    Pack years: 1.00    Types: Cigarettes    Last attempt to quit: 05/06/2015    Years since quitting: 4.0  . Smokeless tobacco: Never Used  . Tobacco comment: smoking cessation materials not required  Substance Use Topics  . Alcohol use: No    Alcohol/week: 0.0 standard drinks    Comment: rarely small glass of wine     Current Outpatient Medications:  .  aspirin 81 MG tablet, Take 81 mg by mouth daily., Disp: , Rfl:  .  atorvastatin (LIPITOR) 10 MG tablet, TAKE 1 TABLET BY MOUTH AT BEDTIME, Disp: 90 tablet, Rfl: 0 .  cetirizine (ZYRTEC) 10 MG tablet, Take 10 mg by mouth as needed for allergies., Disp: , Rfl:  .  cholecalciferol (VITAMIN D) 1000 units tablet, Take 1,000 Units by mouth daily., Disp: , Rfl:  .  diphenhydrAMINE (BENADRYL) 25 mg capsule, Take 25 mg by mouth every 6 (six) hours as needed., Disp: , Rfl:  .  folic acid (FOLVITE) 1 MG tablet, Take 1 mg by mouth daily., Disp: , Rfl:  .  gabapentin (NEURONTIN) 300 MG capsule, TAKE 1 CAPSULE BY MOUTH EVERY MORNING, TAKE 1 CAPSULE EVERY DAY AT NOON, THEN TAKE 2 CAPSULES EVERY NIGHT AT BEDTIME, Disp: 360 capsule, Rfl: 0 .  LINZESS 290 MCG CAPS capsule, TAKE 1 CAPSULE(290 MCG) BY MOUTH DAILY, Disp: 90 capsule, Rfl: 0 .  metFORMIN (GLUCOPHAGE XR) 500 MG 24 hr tablet, One by mouth daily for  one week, then two a day (for diabetes), Disp: 53 tablet, Rfl: 0 .  methotrexate 50 MG/2ML injection, Take 0.6 ml SQ once a week x 12 weeks 4 vials of 2 ml for 12 weeks, Disp: , Rfl:  .  Multiple Vitamins-Minerals (MULTIVITAMIN/EXTRA VITAMIN D3 PO), Take by mouth., Disp: , Rfl:  .  OLANZapine (ZYPREXA) 15 MG tablet, Take 15 mg by mouth at bedtime., Disp: , Rfl: 0 .  traZODone (DESYREL) 150 MG tablet, Take 150 mg by mouth at bedtime., Disp: , Rfl: 0 .  triamcinolone ointment (KENALOG) 0.5 %, Apply 1 application topically 2 (two) times daily., Disp: 60  g, Rfl: 1 .  Accu-Chek FastClix Lancets MISC, USE TO CHECK BLOOD SUGAR TWICE DAILY, Disp: 102 each, Rfl: 0 .  azelastine (ASTELIN) 0.1 % nasal spray, Place 2 sprays into both nostrils 2 (two) times daily. Use in each nostril as directed, Disp: 30 mL, Rfl: 12 .  B-D INS SYR ULTRAFINE 1CC/31G 31G X 5/16" 1 ML MISC, use as directed TO INJECT METHOTREXATE, Disp: , Rfl: 1 .  Blood Glucose Monitoring Suppl (ACCU-CHEK NANO SMARTVIEW) w/Device KIT, Use as directed once daily. LON 99, E11.9, Disp: 1 kit, Rfl: 0 .  glucose blood (ACCU-CHEK SMARTVIEW) test strip, Test blood sugar once a day if desired; LON 99 months, Dx E11.69, Disp: 100 each, Rfl: 1  Allergies  Allergen Reactions  . Ace Inhibitors Swelling    Angioedema (specifically from enalapril)  . Enalapril Swelling    angioedema  . Penicillins Hives and Swelling    Has patient had a PCN reaction causing immediate rash, facial/tongue/throat swelling, SOB or lightheadedness with hypotension: Yes Has patient had a PCN reaction causing severe rash involving mucus membranes or skin necrosis: No Has patient had a PCN reaction that required hospitalization Yes Has patient had a PCN reaction occurring within the last 10 years: No If all of the above answers are "NO", then may proceed with Cephalosporin use.  . Statins Hives    Review of Systems  Constitutional: Negative for chills, fever and  malaise/fatigue.  HENT: Positive for congestion and sinus pain. Negative for ear pain and sore throat.   Respiratory: Negative for cough and shortness of breath.   Cardiovascular: Negative for chest pain, palpitations and leg swelling.  Gastrointestinal: Negative for abdominal pain.  Musculoskeletal: Negative for joint pain and myalgias.  Skin: Negative for rash.  Neurological: Negative for dizziness and headaches.  Psychiatric/Behavioral: The patient is not nervous/anxious and does not have insomnia.      No other specific complaints in a complete review of systems (except as listed in HPI above).  Objective  Vitals:   05/17/19 0924  BP: 118/68  Pulse: 90  Resp: 14  Temp: 98.2 F (36.8 C)  TempSrc: Oral  SpO2: 99%  Weight: 225 lb 14.4 oz (102.5 kg)  Height: '5\' 7"'  (1.702 m)    Body mass index is 35.38 kg/m.  Nursing Note and Vital Signs reviewed.  Physical Exam Constitutional:      Appearance: Normal appearance.  HENT:     Head: Normocephalic and atraumatic.     Nose: Congestion present.     Right Sinus: Maxillary sinus tenderness and frontal sinus tenderness present.     Left Sinus: No maxillary sinus tenderness or frontal sinus tenderness.  Neck:     Musculoskeletal: Normal range of motion.  Cardiovascular:     Rate and Rhythm: Normal rate.  Pulmonary:     Effort: Pulmonary effort is normal.  Musculoskeletal: Normal range of motion.  Skin:    General: Skin is warm and dry.     Findings: Rash (in central sole of foot has peeled skin and small pin point red areas. similar dryness and peeling noted in hands- pt states had blister there previously) present.  Neurological:     General: No focal deficit present.     Mental Status: She is alert and oriented to person, place, and time.  Psychiatric:        Mood and Affect: Mood normal.        Behavior: Behavior normal.        Thought  Content: Thought content normal.       No results found for this or any  previous visit (from the past 48 hour(s)).  Assessment & Plan 1. Rash of hands Continue steriods, discussed prevention of drying skin  - Ambulatory referral to Dermatology  2. Acute recurrent maxillary sinusitis If unimproved in one week let us know and can refer to ENT - doxycycline (VIBRA-TABS) 100 MG tablet; Take 1 tablet (100 mg total) by mouth 2 (two) times daily.  Dispense: 20 tablet; Refill: 0  3. Environmental allergies Discussed risks and benefits.  - montelukast (SINGULAIR) 10 MG tablet; Take 1 tablet (10 mg total) by mouth at bedtime.  Dispense: 30 tablet; Refill: 0  4. Hypertension goal BP (blood pressure) < 130/80 Well controlled today   5. Rheumatoid arthritis involving both hands with positive rheumatoid factor ([place]) Follow-up with specialist   6. Bilateral carpal tunnel syndrome Declines surgery

## 2019-05-24 ENCOUNTER — Other Ambulatory Visit: Payer: Self-pay | Admitting: Nurse Practitioner

## 2019-06-02 DIAGNOSIS — R21 Rash and other nonspecific skin eruption: Secondary | ICD-10-CM | POA: Diagnosis not present

## 2019-06-09 ENCOUNTER — Other Ambulatory Visit: Payer: Self-pay | Admitting: Nurse Practitioner

## 2019-06-09 DIAGNOSIS — R0981 Nasal congestion: Secondary | ICD-10-CM

## 2019-06-15 ENCOUNTER — Other Ambulatory Visit: Payer: Self-pay | Admitting: Nurse Practitioner

## 2019-06-15 DIAGNOSIS — Z9109 Other allergy status, other than to drugs and biological substances: Secondary | ICD-10-CM

## 2019-06-24 DIAGNOSIS — E119 Type 2 diabetes mellitus without complications: Secondary | ICD-10-CM | POA: Diagnosis not present

## 2019-06-24 LAB — HM DIABETES EYE EXAM

## 2019-06-28 DIAGNOSIS — H5213 Myopia, bilateral: Secondary | ICD-10-CM | POA: Diagnosis not present

## 2019-07-01 ENCOUNTER — Ambulatory Visit: Payer: Medicare Other | Admitting: Family Medicine

## 2019-07-01 ENCOUNTER — Encounter: Payer: Self-pay | Admitting: Family Medicine

## 2019-07-01 ENCOUNTER — Ambulatory Visit (INDEPENDENT_AMBULATORY_CARE_PROVIDER_SITE_OTHER): Payer: Medicare Other | Admitting: Family Medicine

## 2019-07-01 ENCOUNTER — Other Ambulatory Visit: Payer: Self-pay

## 2019-07-01 VITALS — BP 130/72 | HR 90 | Temp 95.9°F | Resp 14 | Ht 67.0 in | Wt 228.5 lb

## 2019-07-01 DIAGNOSIS — M05741 Rheumatoid arthritis with rheumatoid factor of right hand without organ or systems involvement: Secondary | ICD-10-CM

## 2019-07-01 DIAGNOSIS — D849 Immunodeficiency, unspecified: Secondary | ICD-10-CM | POA: Insufficient documentation

## 2019-07-01 DIAGNOSIS — F3132 Bipolar disorder, current episode depressed, moderate: Secondary | ICD-10-CM

## 2019-07-01 DIAGNOSIS — I10 Essential (primary) hypertension: Secondary | ICD-10-CM | POA: Diagnosis not present

## 2019-07-01 DIAGNOSIS — J3089 Other allergic rhinitis: Secondary | ICD-10-CM | POA: Diagnosis not present

## 2019-07-01 DIAGNOSIS — L301 Dyshidrosis [pompholyx]: Secondary | ICD-10-CM

## 2019-07-01 DIAGNOSIS — M05742 Rheumatoid arthritis with rheumatoid factor of left hand without organ or systems involvement: Secondary | ICD-10-CM

## 2019-07-01 DIAGNOSIS — E1169 Type 2 diabetes mellitus with other specified complication: Secondary | ICD-10-CM | POA: Diagnosis not present

## 2019-07-01 DIAGNOSIS — E785 Hyperlipidemia, unspecified: Secondary | ICD-10-CM

## 2019-07-01 DIAGNOSIS — D899 Disorder involving the immune mechanism, unspecified: Secondary | ICD-10-CM

## 2019-07-01 DIAGNOSIS — E669 Obesity, unspecified: Secondary | ICD-10-CM | POA: Insufficient documentation

## 2019-07-01 MED ORDER — LORATADINE 10 MG PO TABS: 10.0000 mg | ORAL_TABLET | Freq: Every day | ORAL | 11 refills | Status: DC

## 2019-07-01 MED ORDER — AZELASTINE HCL 0.1 % NA SOLN: 2.0000 | Freq: Two times a day (BID) | NASAL | 12 refills | Status: AC

## 2019-07-01 NOTE — Patient Instructions (Signed)
Take Zyrtec and Benadryl at night.  Take Claritin and Azelastine Nasal Spray in the mornings.

## 2019-07-01 NOTE — Progress Notes (Signed)
Name: Elizabeth [name]   MRN: [id number]    DOB: 19-Jun-1966   Date:07/01/2019       Progress Note  Subjective  Chief Complaint  Chief Complaint  Patient presents with  . Follow-up  . Nasal Congestion    on going no other symptoms    HPI  Hypertension: Patient is on no medications at this time and is diet controlled.  Moderately compliant with low-salt diet.  Denies chest pain, headaches, blurry vision, or BLE edema.   Rash on Hands: Improving - seeing dermatology and this is going well.  Hands are still a bit itchy, no pain.  AR: She has recurrent sinusitis, feels like she is having a flare right now.  She is using singulair, taking benadryl and zyrtec in the morning.  This is not very effective on its own, we will add claritin and azelastine.   RA: She is taking methotrexate.  HAd a recent fall 2 weeks ago that caused a right knee bruise and swelling (she fell pulling roots up in her yard) that has since improved.  She is taking gabapentin for chronic pain and it seems to be helping some - pain level is about a 4 at this time. Due for an appointment with Dr. Meda Coffee.  She is tolerating methotrexate.  Diabetes mellitus type 2 Checking sugars?  yes How often? daily Range (low to high) over last two weeks: 104-138 Trying to limit white bread, white rice, white potatoes, sweets?  yes, but does struggle with junk food sometimes. Trying to limit sweetened drinks like iced tea, soft drinks, sports drinks, fruit juices?  yes Checking feet every day/night?  yes Last eye exam: UTD Denies: Polyuria, polydipsia, polyphagia, vision changes.  Has neuropathy (hands) - taking gabapentin for this and her back. Most recent A1C:  Lab Results  Component Value Date   HGBA1C 6.9 (H) 12/31/2018    We will recheck today. Last CMP Results : is due for repeat todayUrine Micro UTD? No Current Medication Management: Diabetic Medications: Metformin ACEI/ARB: Not Indicated Statin: Yes Aspirin therapy: Yes   Bipolar: Lost her father to COVID-19 this spring and was not able to say goodbye.  She was seeing Dr. Ernie Hew, and he passed away a few months ago.  She needs new psychiatrist.  She is off of her Zyprexa due to  It causing her to feel tired.   HLD: Taking statin medication; due for labs. No chest pain, shortness of breath, or myalgias.   Patient Active Problem List   Diagnosis Date Noted  . Immunosuppressed status ([place]) 07/01/2019  . Morbid obesity ([place]) 07/01/2019  . Obesity (BMI 30.0-34.9) 09/29/2018  . Vaginal atrophy 01/01/2017  . Hyperlipidemia LDL goal <70 10/04/2016  . Diverticula of colon 01/24/2016  . Hypertension goal BP (blood pressure) < 130/80 01/17/2016  . Bilateral carpal tunnel syndrome 01/03/2016  . Bursitis, trochanteric 11/22/2015  . Inversion, nipple 11/08/2015  . Diabetes mellitus type 2 in obese ([place]) 10/23/2015  . Rheumatoid arthritis involving both hands ([place]) 10/23/2015  . Benign neoplasm of descending colon   . Benign neoplasm of sigmoid colon   . Bipolar disorder, current episode depressed, moderate ([place]) 07/03/2015  . IBS (irritable bowel syndrome) 07/03/2015  . Insomnia, uncontrolled 07/03/2015  . GERD without esophagitis 07/03/2015  . Bilateral hip bursitis 07/03/2015    Past Surgical History:  Procedure Laterality Date  . BOWEL RESECTION    . CHOLECYSTECTOMY  1997   [place], [place]  . COLONOSCOPY WITH PROPOFOL N/A 09/07/2015  Procedure: COLONOSCOPY WITH PROPOFOL;  Surgeon: Lucilla Lame, MD;  Location: [facility];  Service: Endoscopy;  Laterality: N/A;  . ESOPHAGOGASTRODUODENOSCOPY (EGD) WITH ESOPHAGEAL DILATION    . ESOPHAGOGASTRODUODENOSCOPY (EGD) WITH PROPOFOL N/A 09/07/2015   Procedure: ESOPHAGOGASTRODUODENOSCOPY (EGD) WITH PROPOFOL;  Surgeon: Lucilla Lame, MD;  Location: [facility];  Service: Endoscopy;  Laterality: N/A;  . PARTIAL COLECTOMY N/A 01/24/2016   Procedure: SIGMOID COLECTOMY;  Surgeon: Florene Glen, MD;  Location:  [facility];  Service: General;  Laterality: N/A;  . POLYPECTOMY N/A 09/07/2015   Procedure: POLYPECTOMY;  Surgeon: Lucilla Lame, MD;  Location: [facility];  Service: Endoscopy;  Laterality: N/A;  . TUBAL LIGATION      Family History  Problem Relation Age of Onset  . Hypertension Mother   . Diabetes Mother   . Arthritis/Rheumatoid Mother   . Hyperlipidemia Mother   . Alcohol abuse Father   . Esophageal varices Father   . Diverticulosis Maternal Aunt   . Diverticulosis Maternal Grandmother   . Diabetes Maternal Grandmother   . Heart disease Maternal Grandmother   . Dementia Maternal Grandmother   . Diabetes Brother   . Kidney disease Brother   . Aneurysm Maternal Grandfather   . Diabetes Paternal Grandmother   . Healthy Brother   . Depression Daughter        bipolar disorder  . Depression Daughter        bipolar disorder  . Breast cancer Neg Hx     Social History   Socioeconomic History  . Marital status: Married    Spouse name: Mortimer Fries  . Number of children: 3  . Years of education: some college  . Highest education level: 12th grade  Occupational History  . Occupation: Disabled  Social Needs  . Financial resource strain: Not hard at all  . Food insecurity    Worry: Never true    Inability: Never true  . Transportation needs    Medical: No    Non-medical: No  Tobacco Use  . Smoking status: Former Smoker    Packs/day: 1.00    Years: 1.00    Pack years: 1.00    Types: Cigarettes    Quit date: 05/06/2015    Years since quitting: 4.1  . Smokeless tobacco: Never Used  . Tobacco comment: smoking cessation materials not required  Substance and Sexual Activity  . Alcohol use: No    Alcohol/week: 0.0 standard drinks    Comment: rarely small glass of wine  . Drug use: No  . Sexual activity: Yes    Birth control/protection: None  Lifestyle  . Physical activity    Days per week: 0 days    Minutes per session: 0 min  . Stress: Only a little  Relationships   . Social connections    Talks on phone: More than three times a week    Gets together: Three times a week    Attends religious service: More than 4 times per year    Active member of club or organization: No    Attends meetings of clubs or organizations: Never    Relationship status: Married  . Intimate partner violence    Fear of current or ex partner: No    Emotionally abused: No    Physically abused: No    Forced sexual activity: No  Other Topics Concern  . Not on file  Social History Narrative  . Not on file     Current Outpatient Medications:  .  Accu-Chek  FastClix Lancets MISC, USE TO CHECK BLOOD SUGAR TWICE DAILY, Disp: 102 each, Rfl: 0 .  aspirin 81 MG tablet, Take 81 mg by mouth daily., Disp: , Rfl:  .  atorvastatin (LIPITOR) 10 MG tablet, TAKE 1 TABLET BY MOUTH AT BEDTIME, Disp: 90 tablet, Rfl: 0 .  azelastine (ASTELIN) 0.1 % nasal spray, Place 2 sprays into both nostrils 2 (two) times daily. Use in each nostril as directed, Disp: 30 mL, Rfl: 12 .  B-D INS SYR ULTRAFINE 1CC/31G 31G X 5/16" 1 ML MISC, use as directed TO INJECT METHOTREXATE, Disp: , Rfl: 1 .  Blood Glucose Monitoring Suppl (ACCU-CHEK NANO SMARTVIEW) w/Device KIT, Use as directed once daily. LON 99, E11.9, Disp: 1 kit, Rfl: 0 .  cetirizine (ZYRTEC) 10 MG tablet, Take 10 mg by mouth as needed for allergies., Disp: , Rfl:  .  cholecalciferol (VITAMIN D) 1000 units tablet, Take 1,000 Units by mouth daily., Disp: , Rfl:  .  diphenhydrAMINE (BENADRYL) 25 mg capsule, Take 25 mg by mouth every 6 (six) hours as needed., Disp: , Rfl:  .  folic acid (FOLVITE) 1 MG tablet, Take 1 mg by mouth daily., Disp: , Rfl:  .  gabapentin (NEURONTIN) 300 MG capsule, TAKE 1 CAPSULE BY MOUTH EVERY MORNING TAKE 1 CAPSULE AT NOON AND TAKE 2 CAPSULAS AT BEDTIME, Disp: 360 capsule, Rfl: 0 .  glucose blood (ACCU-CHEK SMARTVIEW) test strip, Test blood sugar once a day if desired; LON 99 months, Dx E11.69, Disp: 100 each, Rfl: 1 .  LINZESS  290 MCG CAPS capsule, TAKE 1 CAPSULE(290 MCG) BY MOUTH DAILY, Disp: 90 capsule, Rfl: 0 .  metFORMIN (GLUCOPHAGE XR) 500 MG 24 hr tablet, One by mouth daily for one week, then two a day (for diabetes), Disp: 53 tablet, Rfl: 0 .  methotrexate 50 MG/2ML injection, Take 0.6 ml SQ once a week x 12 weeks 4 vials of 2 ml for 12 weeks, Disp: , Rfl:  .  montelukast (SINGULAIR) 10 MG tablet, TAKE 1 TABLET(10 MG) BY MOUTH AT BEDTIME, Disp: 90 tablet, Rfl: 0 .  Multiple Vitamins-Minerals (MULTIVITAMIN/EXTRA VITAMIN D3 PO), Take by mouth., Disp: , Rfl:  .  traZODone (DESYREL) 150 MG tablet, Take 150 mg by mouth at bedtime., Disp: , Rfl: 0 .  triamcinolone ointment (KENALOG) 0.5 %, Apply 1 application topically 2 (two) times daily., Disp: 60 g, Rfl: 1  Allergies  Allergen Reactions  . Ace Inhibitors Swelling    Angioedema (specifically from enalapril)  . Enalapril Swelling    angioedema  . Penicillins Hives and Swelling    Has patient had a PCN reaction causing immediate rash, facial/tongue/throat swelling, SOB or lightheadedness with hypotension: Yes Has patient had a PCN reaction causing severe rash involving mucus membranes or skin necrosis: No Has patient had a PCN reaction that required hospitalization Yes Has patient had a PCN reaction occurring within the last 10 years: No If all of the above answers are "NO", then may proceed with Cephalosporin use.  . Statins Hives   I personally reviewed active problem list, medication list, allergies, notes from last encounter, lab results with the patient/caregiver today.  ROS  Constitutional: Negative for fever or weight change.  Respiratory: Negative for cough and shortness of breath.   Cardiovascular: Negative for chest pain or palpitations.  Gastrointestinal: Negative for abdominal pain, no bowel changes.  Musculoskeletal: Negative for gait problem or joint swelling.  Skin: Negative for rash.  Neurological: Negative for dizziness or headache.  No  other  specific complaints in a complete review of systems (except as listed in HPI above).  Objective  Vitals:   07/01/19 1022  BP: 130/72  Pulse: 90  Resp: 14  Temp: (!) 95.9 F (35.5 C)  SpO2: 97%  Weight: 228 lb 8 oz (103.6 kg)  Height: _0  (1.702 m)   Body mass index is 35.79 kg/m.  Physical Exam  Constitutional: Patient appears well-developed and well-nourished. No distress.  HENT: Head: Normocephalic and atraumatic.  Nose: Turbinates inflamed, rhinorrhea present. Mouth/Throat: Oropharynx is clear and moist, some cobblestoning present. No oropharyngeal exudate or tonsillar swelling.  Eyes: Conjunctivae and EOM are normal. No scleral icterus.  Neck: Normal range of motion. Neck supple. No JVD present. No thyromegaly present.  Cardiovascular: Normal rate, regular rhythm and normal heart sounds.  No murmur heard. No BLE edema. Pulmonary/Chest: Effort normal and breath sounds normal. No respiratory distress. Musculoskeletal: Normal range of motion, no joint effusions. No gross deformities. There is some tenderness on the proximal anterior RIGHT Knee, very minimal appreciable edema, normal popliteal fossa bilaterally. Neurological: Pt is alert and oriented to person, place, and time. No cranial nerve deficit. Coordination, balance, strength, speech and gait are normal.  Skin: Skin is warm and dry. No rash noted. No erythema.  Psychiatric: Patient has a normal mood and affect. behavior is normal. Judgment and thought content normal.  No results found for this or any previous visit (from the past 72 hour(s)).   PHQ2/9: Depression screen [facility] 2/9 07/01/2019 05/17/2019 04/01/2019 03/30/2019 12/31/2018  Decreased Interest 0 0 1 0 0  Down, Depressed, Hopeless 1 0 1 0 0  PHQ - 2 Score 1 0 2 0 0  Altered sleeping 0 0 3 0 0  Tired, decreased energy 0 0 0 0 0  Change in appetite 0 0 3 0 0  Feeling bad or failure about yourself  0 0 0 0 0  Trouble concentrating 0 0 0 0 0  Moving slowly or  fidgety/restless 0 0 0 0 0  Suicidal thoughts 0 0 0 0 0  PHQ-9 Score 1 0 8 0 0  Difficult doing work/chores Not difficult at all Not difficult at all Not difficult at all Not difficult at all Not difficult at all  Some recent data might be hidden   PHQ-2/9 Result is negative.    Fall Risk: Fall Risk  07/01/2019 05/17/2019 04/01/2019 03/30/2019 12/31/2018  Falls in the past year? 1 1 0 0 0  Comment - - - - -  Number falls in past yr: 0 - 0 - 0  Injury with Fall? 1 - 0 - 0  Comment - - - - -  Risk for fall due to : - - - - -  Risk for fall due to: Comment - - - - -  Follow up - - Falls prevention discussed - -    Functional Status Survey: Is the patient deaf or have difficulty hearing?: No Does the patient have difficulty seeing, even when wearing glasses/contacts?: No Does the patient have difficulty concentrating, remembering, or making decisions?: No Does the patient have difficulty walking or climbing stairs?: No Does the patient have difficulty dressing or bathing?: No Does the patient have difficulty doing errands alone such as visiting a doctor's office or shopping?: No  Assessment & Plan  1. Hypertension goal BP (blood pressure) < 130/80 - STable today - COMPLETE METABOLIC PANEL WITH GFR  2. Dyshidrotic eczema - Seeing dermatology  3. Non-seasonal allergic rhinitis, unspecified trigger -  azelastine (ASTELIN) 0.1 % nasal spray; Place 2 sprays into both nostrils 2 (two) times daily. Use in each nostril as directed  Dispense: 30 mL; Refill: 12 - loratadine (CLARITIN) 10 MG tablet; Take 1 tablet (10 mg total) by mouth daily.  Dispense: 30 tablet; Refill: 11 - Ambulatory referral to Allergy  4. Rheumatoid arthritis involving both hands with positive rheumatoid factor ([facility]CC) - Seeing Dr. Meda Coffee  5. Diabetes mellitus type 2 in obese ([facility]) - Continue Metformin - Hemoglobin A1c - Urine Microalbumin w/creat. ratio  6. Immunosuppressed status ([place]) - Seeing Dr. Meda Coffee, doing well  on Methotrexate  7. Bipolar disorder, current episode depressed, moderate ([place]) - Ambulatory referral to Psychiatry  8. Morbid obesity ([place]) - Discussed importance of 150 minutes of physical activity weekly, eat two servings of fish weekly, eat one serving of tree nuts ( cashews, pistachios, pecans, almonds.Marland Kitchen) every other day, eat 6 servings of fruit/vegetables daily and drink plenty of water and avoid sweet beverages.   9. Hyperlipidemia LDL goal <70 - Lipid panel

## 2019-07-02 LAB — COMPLETE METABOLIC PANEL WITH GFR
AG Ratio: 1.6 (calc) (ref 1.0–2.5)
ALT: 32 U/L — ABNORMAL HIGH (ref 6–29)
AST: 20 U/L (ref 10–35)
Albumin: 4.2 g/dL (ref 3.6–5.1)
Alkaline phosphatase (APISO): 78 U/L (ref 37–153)
BUN: 12 mg/dL (ref 7–25)
CO2: 26 mmol/L (ref 20–32)
Calcium: 9.4 mg/dL (ref 8.6–10.4)
Chloride: 107 mmol/L (ref 98–110)
Creat: 0.69 mg/dL (ref 0.50–1.05)
GFR, Est African American: 116 mL/min/{1.73_m2} (ref >=60)
GFR, Est Non African American: 100 mL/min/{1.73_m2} (ref >=60)
Globulin: 2.7 g/dL (calc) (ref 1.9–3.7)
Glucose, Bld: 147 mg/dL — ABNORMAL HIGH (ref 65–99)
Potassium: 4.3 mmol/L (ref 3.5–5.3)
Sodium: 141 mmol/L (ref 135–146)
Total Bilirubin: 0.5 mg/dL (ref 0.2–1.2)
Total Protein: 6.9 g/dL (ref 6.1–8.1)

## 2019-07-02 LAB — MICROALBUMIN / CREATININE URINE RATIO
Creatinine, Urine: 201 mg/dL (ref 20–275)
Microalb Creat Ratio: 4 mcg/mg creat (ref <30)
Microalb, Ur: 0.8 mg/dL

## 2019-07-02 LAB — LIPID PANEL
Cholesterol: 138 mg/dL (ref <200)
HDL: 39 mg/dL — ABNORMAL LOW (ref >=50)
LDL Cholesterol (Calc): 73 mg/dL (calc)
Non-HDL Cholesterol (Calc): 99 mg/dL (calc) (ref <130)
Total CHOL/HDL Ratio: 3.5 (calc) (ref <5.0)
Triglycerides: 193 mg/dL — ABNORMAL HIGH (ref <150)

## 2019-07-02 LAB — HEMOGLOBIN A1C
Hgb A1c MFr Bld: 6.6 % of total Hgb — ABNORMAL HIGH (ref <5.7)
Mean Plasma Glucose: 143 (calc)
eAG (mmol/L): 7.9 (calc)

## 2019-07-08 DIAGNOSIS — H169 Unspecified keratitis: Secondary | ICD-10-CM | POA: Diagnosis not present

## 2019-07-10 ENCOUNTER — Other Ambulatory Visit: Payer: Self-pay | Admitting: Nurse Practitioner

## 2019-07-19 ENCOUNTER — Other Ambulatory Visit: Payer: Self-pay | Admitting: Nurse Practitioner

## 2019-07-19 DIAGNOSIS — R059 Cough, unspecified: Secondary | ICD-10-CM

## 2019-07-19 DIAGNOSIS — J0100 Acute maxillary sinusitis, unspecified: Secondary | ICD-10-CM

## 2019-07-19 DIAGNOSIS — J029 Acute pharyngitis, unspecified: Secondary | ICD-10-CM

## 2019-07-19 DIAGNOSIS — R05 Cough: Secondary | ICD-10-CM

## 2019-07-20 DIAGNOSIS — L308 Other specified dermatitis: Secondary | ICD-10-CM | POA: Diagnosis not present

## 2019-07-20 DIAGNOSIS — L301 Dyshidrosis [pompholyx]: Secondary | ICD-10-CM | POA: Diagnosis not present

## 2019-07-20 DIAGNOSIS — L400 Psoriasis vulgaris: Secondary | ICD-10-CM | POA: Diagnosis not present

## 2019-08-06 DIAGNOSIS — M0609 Rheumatoid arthritis without rheumatoid factor, multiple sites: Secondary | ICD-10-CM | POA: Diagnosis not present

## 2019-08-06 DIAGNOSIS — L409 Psoriasis, unspecified: Secondary | ICD-10-CM | POA: Diagnosis not present

## 2019-08-06 DIAGNOSIS — Z79899 Other long term (current) drug therapy: Secondary | ICD-10-CM | POA: Diagnosis not present

## 2019-08-09 ENCOUNTER — Other Ambulatory Visit: Payer: Self-pay | Admitting: Nurse Practitioner

## 2019-08-10 NOTE — Telephone Encounter (Signed)
Please set up routine appointment in 4 months  

## 2019-08-11 DIAGNOSIS — H524 Presbyopia: Secondary | ICD-10-CM | POA: Diagnosis not present

## 2019-08-15 ENCOUNTER — Other Ambulatory Visit: Payer: Self-pay | Admitting: Nurse Practitioner

## 2019-08-15 DIAGNOSIS — G8929 Other chronic pain: Secondary | ICD-10-CM

## 2019-08-15 DIAGNOSIS — M5440 Lumbago with sciatica, unspecified side: Secondary | ICD-10-CM

## 2019-08-23 ENCOUNTER — Other Ambulatory Visit: Payer: Self-pay

## 2019-08-23 MED ORDER — LINACLOTIDE 290 MCG PO CAPS: ORAL_CAPSULE | ORAL | 0 refills | Status: DC

## 2019-08-31 ENCOUNTER — Other Ambulatory Visit: Payer: Self-pay

## 2019-09-01 MED ORDER — ACCU-CHEK FASTCLIX LANCETS MISC: 5 refills | Status: DC

## 2019-09-10 ENCOUNTER — Other Ambulatory Visit: Payer: Self-pay

## 2019-09-10 DIAGNOSIS — Z9109 Other allergy status, other than to drugs and biological substances: Secondary | ICD-10-CM

## 2019-09-10 MED ORDER — MONTELUKAST SODIUM 10 MG PO TABS: ORAL_TABLET | ORAL | 0 refills | Status: DC

## 2019-09-16 ENCOUNTER — Other Ambulatory Visit: Payer: Self-pay

## 2019-09-16 DIAGNOSIS — Z20822 Contact with and (suspected) exposure to covid-19: Secondary | ICD-10-CM

## 2019-09-17 LAB — NOVEL CORONAVIRUS, NAA: SARS-CoV-2, NAA: NOT DETECTED

## 2019-10-13 ENCOUNTER — Other Ambulatory Visit: Payer: Self-pay

## 2019-10-15 ENCOUNTER — Other Ambulatory Visit: Payer: Self-pay | Admitting: Family Medicine

## 2019-10-15 MED ORDER — METFORMIN HCL ER 500 MG PO TB24: 500.0000 mg | ORAL_TABLET | Freq: Two times a day (BID) | ORAL | 0 refills | Status: DC

## 2019-10-15 NOTE — Addendum Note (Signed)
Addended by: Docia Furl on: 10/15/2019 02:03 PM   Modules accepted: Orders

## 2019-10-15 NOTE — Telephone Encounter (Signed)
So pt states has been on.  I called pharmacy to get details they state had refills for a year on the script.  So I told them it must of been a mis type on their end because we had none?  So long story short she has been on it

## 2019-10-15 NOTE — Telephone Encounter (Signed)
At what dose?  The one we can see is a titration. 500 mg BID?  Or other dose?  If you call the pharmacy back to discuss, please give VO from me. Or you could put the Rx in the chart and I'll cosign the order if you're previously discussed dosing.  Thank you!  Kristeen Miss

## 2019-10-18 ENCOUNTER — Encounter: Payer: Self-pay | Admitting: Family Medicine

## 2019-10-18 ENCOUNTER — Other Ambulatory Visit: Payer: Self-pay

## 2019-10-18 ENCOUNTER — Ambulatory Visit (INDEPENDENT_AMBULATORY_CARE_PROVIDER_SITE_OTHER): Payer: Medicare Other | Admitting: Family Medicine

## 2019-10-18 ENCOUNTER — Telehealth: Payer: Self-pay | Admitting: Family Medicine

## 2019-10-18 VITALS — BP 134/86 | HR 90 | Temp 97.9°F | Resp 14 | Ht 66.0 in | Wt 228.5 lb

## 2019-10-18 DIAGNOSIS — D849 Immunodeficiency, unspecified: Secondary | ICD-10-CM

## 2019-10-18 DIAGNOSIS — N6310 Unspecified lump in the right breast, unspecified quadrant: Secondary | ICD-10-CM

## 2019-10-18 DIAGNOSIS — F3132 Bipolar disorder, current episode depressed, moderate: Secondary | ICD-10-CM

## 2019-10-18 DIAGNOSIS — E1165 Type 2 diabetes mellitus with hyperglycemia: Secondary | ICD-10-CM | POA: Diagnosis not present

## 2019-10-18 DIAGNOSIS — J310 Chronic rhinitis: Secondary | ICD-10-CM | POA: Diagnosis not present

## 2019-10-18 DIAGNOSIS — G8929 Other chronic pain: Secondary | ICD-10-CM

## 2019-10-18 DIAGNOSIS — J329 Chronic sinusitis, unspecified: Secondary | ICD-10-CM

## 2019-10-18 DIAGNOSIS — M5440 Lumbago with sciatica, unspecified side: Secondary | ICD-10-CM

## 2019-10-18 MED ORDER — DOXYCYCLINE HYCLATE 100 MG PO CAPS: 100.0000 mg | ORAL_CAPSULE | Freq: Two times a day (BID) | ORAL | 0 refills | Status: DC

## 2019-10-18 MED ORDER — GABAPENTIN 300 MG PO CAPS: ORAL_CAPSULE | ORAL | 0 refills | Status: DC

## 2019-10-18 NOTE — Telephone Encounter (Signed)
Spoke with patient on today and she informed me that she will transferring her care to [facility].  She is wanting more consistency when it comes to seeing a provider.

## 2019-10-18 NOTE — Progress Notes (Signed)
Name: Elizabeth [name]   MRN: [id number]    DOB: January 06, 1966   Date:10/18/2019       Progress Note  Chief Complaint  Patient presents with   Follow-up   Diabetes   Hyperlipidemia   Insomnia     Subjective:   Elizabeth [name] is a 53 y.o. female, presents to clinic for follow-up on sinusitis she also was told that she has not had a recent A1c done so she went to follow-up on her diabetes.  We recently called her with questions about her medications because refill request were coming off of a prescription from about 1 year ago which was a dose titration and patient is new to me, I was not sure of her current Metformin dosing.  She has had labs recently and we briefly reviewed her chronic conditions today but she was very concerned about her chronic sinus pressure and congestion  Diabetes Mellitus Type II: Currently managing with metformin BID Pt notes good med compliance Pt has no SE from meds. Fasting CBGs typically run 105-130's, She is checking a few times a week.  No hypoglycemic episodes Denies: Polyuria, polydipsia, polyphagia, vision changes, or neuropathy - has unchanged neuropathy - managed with gabapentin Recent pertinent labs: Lab Results  Component Value Date   HGBA1C 6.6 (H) 07/01/2019   HGBA1C 6.9 (H) 12/31/2018   HGBA1C 6.9 (A) 09/26/2018  UTD on DM foot exam and eye exam ACEI/ARB: no Statin: Yes  Patient is not on any blood pressure medications at this time, still fairly well controlled without meds BP Readings from Last 3 Encounters:  10/18/19 134/86  07/01/19 130/72  05/17/19 118/68  Will continue to monitor atorvastatin 10 mg taking daily no concerns with any side effects she denies chest pain, shortness of breath, myalgias  Patient has history of chronic allergic rhinitis that has frequent flares, only a few months ago about 4 months ago she was seen for the same and medications were changed at that time.  She is taking Claritin and Benadryl at night also  doing Nettie pot and saline sprays, she is doing Zyrtec and Benadryl in the morning also has tried some nasal sprays but in the past was told to stop her Flonase.  Last was given Astelin nasal spray which she is not doing right now.  She has trouble breathing through her nose all the time, has to breathe through her mouth at night and frequently get sore throats in the morning upon waking.  She has pressure in her face bilaterally and to her forehead it changes sides which side is more congested with more pressure currently feels much worse and very tender to the touch also having pain behind her eyes.  Does feel like it is progressed into a sinus infection, last time she had antibiotics was earlier this year.  Is not had any fever sweats or chills but she does have acutely worsening pain with congestion, sinus pain and pressure, drainage.  Patient Active Problem List   Diagnosis Date Noted   Immunosuppressed status ([place]) 07/01/2019   Morbid obesity ([place]) 07/01/2019   Obesity (BMI 30.0-34.9) 09/29/2018   Vaginal atrophy 01/01/2017   Hyperlipidemia LDL goal <70 10/04/2016   Diverticula of colon 01/24/2016   Hypertension goal BP (blood pressure) < 130/80 01/17/2016   Bilateral carpal tunnel syndrome 01/03/2016   Bursitis, trochanteric 11/22/2015   Inversion, nipple 11/08/2015   Diabetes mellitus type 2 in obese ([place]) 10/23/2015   Rheumatoid arthritis involving both hands ([place])  10/23/2015   Benign neoplasm of descending colon    Benign neoplasm of sigmoid colon    Bipolar disorder, current episode depressed, moderate ([place]) 07/03/2015   IBS (irritable bowel syndrome) 07/03/2015   Insomnia, uncontrolled 07/03/2015   GERD without esophagitis 07/03/2015   Bilateral hip bursitis 07/03/2015    Past Surgical History:  Procedure Laterality Date   BOWEL RESECTION     CHOLECYSTECTOMY  1997   [place]   COLONOSCOPY WITH PROPOFOL N/A 09/07/2015   Procedure: COLONOSCOPY WITH  PROPOFOL;  Surgeon: Lucilla Lame, MD;  Location: [facility];  Service: Endoscopy;  Laterality: N/A;   ESOPHAGOGASTRODUODENOSCOPY (EGD) WITH ESOPHAGEAL DILATION     ESOPHAGOGASTRODUODENOSCOPY (EGD) WITH PROPOFOL N/A 09/07/2015   Procedure: ESOPHAGOGASTRODUODENOSCOPY (EGD) WITH PROPOFOL;  Surgeon: Lucilla Lame, MD;  Location: [facility];  Service: Endoscopy;  Laterality: N/A;   PARTIAL COLECTOMY N/A 01/24/2016   Procedure: SIGMOID COLECTOMY;  Surgeon: Florene Glen, MD;  Location: [facility];  Service: General;  Laterality: N/A;   POLYPECTOMY N/A 09/07/2015   Procedure: POLYPECTOMY;  Surgeon: Lucilla Lame, MD;  Location: [facility];  Service: Endoscopy;  Laterality: N/A;   TUBAL LIGATION      Family History  Problem Relation Age of Onset   Hypertension Mother    Diabetes Mother    Arthritis/Rheumatoid Mother    Hyperlipidemia Mother    Alcohol abuse Father    Esophageal varices Father    Diverticulosis Maternal Aunt    Diverticulosis Maternal Grandmother    Diabetes Maternal Grandmother    Heart disease Maternal Grandmother    Dementia Maternal Grandmother    Diabetes Brother    Kidney disease Brother    Aneurysm Maternal Grandfather    Diabetes Paternal Grandmother    Healthy Brother    Depression Daughter        bipolar disorder   Depression Daughter        bipolar disorder   Breast cancer Neg Hx     Social History   Socioeconomic History   Marital status: Married    Spouse name: Mortimer Fries   Number of children: 3   Years of education: some college   Highest education level: 12th grade  Occupational History   Occupation: Disabled  Scientist, product/process development strain: Not hard at all   Food insecurity    Worry: Never true    Inability: Never true   Transportation needs    Medical: No    Non-medical: No  Tobacco Use   Smoking status: Former Smoker    Packs/day: 1.00    Years: 1.00    Pack years: 1.00     Types: Cigarettes    Quit date: 05/06/2015    Years since quitting: 4.4   Smokeless tobacco: Never Used   Tobacco comment: smoking cessation materials not required  Substance and Sexual Activity   Alcohol use: No    Alcohol/week: 0.0 standard drinks    Comment: rarely small glass of wine   Drug use: No   Sexual activity: Yes    Birth control/protection: None  Lifestyle   Physical activity    Days per week: 0 days    Minutes per session: 0 min   Stress: Only a little  Relationships   Social connections    Talks on phone: More than three times a week    Gets together: Three times a week    Attends religious service: More than 4 times per year    Active  member of club or organization: No    Attends meetings of clubs or organizations: Never    Relationship status: Married   Intimate partner violence    Fear of current or ex partner: No    Emotionally abused: No    Physically abused: No    Forced sexual activity: No  Other Topics Concern   Not on file  Social History Narrative   Not on file     Current Outpatient Medications:    aspirin 81 MG tablet, Take 81 mg by mouth daily., Disp: , Rfl:    atorvastatin (LIPITOR) 10 MG tablet, TAKE 1 TABLET BY MOUTH AT BEDTIME, Disp: 90 tablet, Rfl: 1   azelastine (ASTELIN) 0.1 % nasal spray, Place 2 sprays into both nostrils 2 (two) times daily. Use in each nostril as directed, Disp: 30 mL, Rfl: 12   B-D INS SYR ULTRAFINE 1CC/31G 31G X 5/16" 1 ML MISC, use as directed TO INJECT METHOTREXATE, Disp: , Rfl: 1   cetirizine (ZYRTEC) 10 MG tablet, Take 10 mg by mouth as needed for allergies., Disp: , Rfl:    cholecalciferol (VITAMIN D) 1000 units tablet, Take 1,000 Units by mouth daily., Disp: , Rfl:    diphenhydrAMINE (BENADRYL) 25 mg capsule, Take 25 mg by mouth every 6 (six) hours as needed., Disp: , Rfl:    folic acid (FOLVITE) 1 MG tablet, Take 1 mg by mouth daily., Disp: , Rfl:    gabapentin (NEURONTIN) 300 MG capsule,  TAKE 1 CAPSULE BY MOUTH EVERY MORNING TAKE 1 CAPSULE AT NOON AND TAKE 2 CAPSULAS AT BEDTIME, Disp: 360 capsule, Rfl: 0   Lifitegrast (XIIDRA) 5 % SOLN, 2 (two) times daily, Disp: , Rfl:    linaclotide (LINZESS) 290 MCG CAPS capsule, TAKE 1 CAPSULE(290 MCG) BY MOUTH DAILY, Disp: 90 capsule, Rfl: 0   loratadine (CLARITIN) 10 MG tablet, Take 1 tablet (10 mg total) by mouth daily., Disp: 30 tablet, Rfl: 11   metFORMIN (GLUCOPHAGE XR) 500 MG 24 hr tablet, Take 1 tablet (500 mg total) by mouth 2 (two) times daily., Disp: 90 tablet, Rfl: 0   methotrexate 50 MG/2ML injection, Take 0.6 ml SQ once a week x 12 weeks 4 vials of 2 ml for 12 weeks, Disp: , Rfl:    montelukast (SINGULAIR) 10 MG tablet, TAKE 1 TABLET(10 MG) BY MOUTH AT BEDTIME, Disp: 90 tablet, Rfl: 0   Multiple Vitamins-Minerals (MULTIVITAMIN/EXTRA VITAMIN D3 PO), Take by mouth., Disp: , Rfl:    OTEZLA 30 MG TABS, , Disp: , Rfl:    traZODone (DESYREL) 150 MG tablet, Take 150 mg by mouth at bedtime., Disp: , Rfl: 0   triamcinolone ointment (KENALOG) 0.5 %, Apply 1 application topically 2 (two) times daily., Disp: 60 g, Rfl: 1   Accu-Chek FastClix Lancets MISC, USE TO CHECK BLOOD SUGAR TWICE DAILY, Disp: 100 each, Rfl: 5   ACCU-CHEK SMARTVIEW test strip, USE TO TEST ONCE DAILY, Disp: 100 strip, Rfl: 0   Blood Glucose Monitoring Suppl (ACCU-CHEK NANO SMARTVIEW) w/Device KIT, Use as directed once daily. LON 99, E11.9, Disp: 1 kit, Rfl: 0  Allergies  Allergen Reactions   Ace Inhibitors Swelling    Angioedema (specifically from enalapril)   Enalapril Swelling    angioedema   Penicillins Hives and Swelling    Has patient had a PCN reaction causing immediate rash, facial/tongue/throat swelling, SOB or lightheadedness with hypotension: Yes Has patient had a PCN reaction causing severe rash involving mucus membranes or skin necrosis: No Has patient  had a PCN reaction that required hospitalization Yes Has patient had a PCN reaction  occurring within the last 10 years: No If all of the above answers are "NO", then may proceed with Cephalosporin use.   Statins Hives    I personally reviewed active problem list, medication list, allergies, family history, social history, health maintenance, notes from last encounter, lab results, imaging with the patient/caregiver today.  Review of Systems  Constitutional: Negative.   HENT: Negative.   Eyes: Negative.   Respiratory: Negative.   Cardiovascular: Negative.   Gastrointestinal: Negative.   Endocrine: Negative.   Genitourinary: Negative.   Musculoskeletal: Negative.   Skin: Negative.   Allergic/Immunologic: Negative.   Neurological: Negative.   Hematological: Negative.   Psychiatric/Behavioral: Negative.   All other systems reviewed and are negative.    Objective:    Vitals:   10/18/19 0944  BP: 134/86  Pulse: 90  Resp: 14  Temp: 97.9 F (36.6 C)  SpO2: 98%  Weight: 228 lb 8 oz (103.6 kg)  Height: '5\' 6"'  (1.676 m)    Body mass index is 36.88 kg/m.  Physical Exam Vitals signs and nursing note reviewed.  Constitutional:      General: She is not in acute distress.    Appearance: She is well-developed. She is not ill-appearing, toxic-appearing or diaphoretic.  HENT:     Head: Normocephalic and atraumatic.     Right Ear: Hearing, tympanic membrane, ear canal and external ear normal.     Left Ear: Hearing, tympanic membrane, ear canal and external ear normal.     Nose: Mucosal edema, congestion and rhinorrhea present. No septal deviation.     Right Nostril: Occlusion present. No epistaxis or septal hematoma.     Left Nostril: No epistaxis or septal hematoma.     Right Turbinates: Enlarged and swollen.     Left Turbinates: Enlarged and swollen.     Right Sinus: Maxillary sinus tenderness and frontal sinus tenderness present.     Left Sinus: Maxillary sinus tenderness and frontal sinus tenderness present.     Comments: Severely enlarged and swollen  nasal turbinates right nare appears completely obstructed left nare has small amount of space between nasal septum and nasal turbinate diffusely injected    Mouth/Throat:     Mouth: Mucous membranes are not pale.     Pharynx: Uvula midline. No oropharyngeal exudate or uvula swelling.     Tonsils: No tonsillar abscesses.  Eyes:     General:        Right eye: No discharge.        Left eye: No discharge.     Conjunctiva/sclera: Conjunctivae normal.     Pupils: Pupils are equal, round, and reactive to light.  Neck:     Musculoskeletal: Normal range of motion and neck supple.     Trachea: No tracheal deviation.  Cardiovascular:     Rate and Rhythm: Normal rate and regular rhythm.     Heart sounds: Normal heart sounds.  Pulmonary:     Effort: Pulmonary effort is normal. No respiratory distress.     Breath sounds: No stridor. No wheezing, rhonchi or rales.  Abdominal:     General: Bowel sounds are normal. There is no distension.     Palpations: Abdomen is soft.  Musculoskeletal: Normal range of motion.  Skin:    General: Skin is warm and dry.     Coloration: Skin is not pale.     Findings: No rash.  Neurological:  Mental Status: She is alert.     Motor: No abnormal muscle tone.     Coordination: Coordination normal.  Psychiatric:        Mood and Affect: Mood normal.        Behavior: Behavior normal.      Recent Results (from the past 2160 hour(s))  Novel Coronavirus, NAA ([facility])     Status: None   Collection Time: 09/16/19 12:00 AM   Specimen: Nasopharyngeal(NP) swabs in vial transport medium   NASOPHARYNGE  TESTING  Result Value Ref Range   SARS-CoV-2, NAA Not Detected Not Detected    Comment: Testing was performed using the cobas(R) SARS-CoV-2 test. This nucleic acid amplification test was developed and its performance characteristics determined by [employer]. Nucleic acid amplification tests include PCR and TMA. This test has not been FDA cleared or  approved. This test has been authorized by FDA under an Emergency Use Authorization (EUA). This test is only authorized for the duration of time the declaration that circumstances exist justifying the authorization of the emergency use of in vitro diagnostic tests for detection of SARS-CoV-2 virus and/or diagnosis of COVID-19 infection under section 564(b)(1) of the Act, 21 U.S.C. 939QZE-0(P) (1), unless the authorization is terminated or revoked sooner. When diagnostic testing is negative, the possibility of a false negative result should be considered in the context of a patient's recent exposures and the presence of clinical signs and symptoms consistent with COVID-19. An individual without symptoms  of COVID-19 and who is not shedding SARS-CoV-2 virus would expect to have a negative (not detected) result in this assay.      PHQ2/9: Depression screen [facility] 2/9 10/18/2019 07/01/2019 05/17/2019 04/01/2019 03/30/2019  Decreased Interest 0 0 0 1 0  Down, Depressed, Hopeless 0 1 0 1 0  PHQ - 2 Score 0 1 0 2 0  Altered sleeping 0 0 0 3 0  Tired, decreased energy 0 0 0 0 0  Change in appetite 0 0 0 3 0  Feeling bad or failure about yourself  0 0 0 0 0  Trouble concentrating 0 0 0 0 0  Moving slowly or fidgety/restless 0 0 0 0 0  Suicidal thoughts 0 0 0 0 0  PHQ-9 Score 0 1 0 8 0  Difficult doing work/chores Not difficult at all Not difficult at all Not difficult at all Not difficult at all Not difficult at all  Some recent data might be hidden    phq 9 is negative - reviewed today  Fall Risk: Fall Risk  10/18/2019 07/01/2019 05/17/2019 04/01/2019 03/30/2019  Falls in the past year? 0 1 1 0 0  Comment - - - - -  Number falls in past yr: 0 0 - 0 -  Injury with Fall? 0 1 - 0 -  Comment - - - - -  Risk for fall due to : - - - - -  Risk for fall due to: Comment - - - - -  Follow up - - - Falls prevention discussed -      Functional Status Survey: Is the patient deaf or have difficulty  hearing?: No Does the patient have difficulty seeing, even when wearing glasses/contacts?: No Does the patient have difficulty concentrating, remembering, or making decisions?: No Does the patient have difficulty walking or climbing stairs?: No Does the patient have difficulty dressing or bathing?: No Does the patient have difficulty doing errands alone such as visiting a doctor's office or shopping?: No  Assessment & Plan:     ICD-10-CM   1. Rhinosinusitis  J31.0 Ambulatory referral to ENT   J32.9 doxycycline (VIBRAMYCIN) 100 MG capsule   chronic AR and recurrent sinusitis, sinuses tender to palpation, symptoms acute on chronic, will treat for bacterial sinusitis but feels she needs ENT eval  2. Immunosuppressed status ([place]) Chronic D84.9    from RA/methotrexate, lower threshold to tx rhinosinusitis with abx  3. Bipolar disorder, current episode depressed, moderate ([place]) Chronic F31.32    moods still good, getting management through her psychiatrist office by "fill-in" staff   4. Type 2 diabetes mellitus with hyperglycemia, without long-term current use of insulin ([facility]) Chronic E11.65    well controlled, labs done recently, at goal <7.0, will repeat labs in a few months, continue taking metformin 500 mg BID  5. Chronic midline low back pain with sciatica, sciatica laterality unspecified  M54.40 gabapentin (NEURONTIN) 300 MG capsule   G89.29    refill on gabapentin which pt states was for back and DM  6. Breast mass, right  N63.10 MM DIAG BREAST TOMO BILATERAL    US BREAST LTD UNI RIGHT INC AXILLA   pt is due for f/up imaging in Dec to complete 2 year monitoring of right breast mass which has been benign in appearance, images reviewed, orders entered     Return in about 3 months (around 01/18/2020) for Routine follow-up DM fup and labs.   Delsa Grana, PA-C 10/18/19 9:50 AM

## 2019-11-02 ENCOUNTER — Ambulatory Visit: Payer: Medicare Other | Admitting: Family Medicine

## 2019-11-15 ENCOUNTER — Other Ambulatory Visit: Payer: Self-pay | Admitting: Family Medicine

## 2019-11-22 ENCOUNTER — Encounter: Payer: Self-pay | Admitting: Family Medicine

## 2019-11-23 ENCOUNTER — Other Ambulatory Visit: Payer: Self-pay | Admitting: Family Medicine

## 2019-11-24 ENCOUNTER — Other Ambulatory Visit: Payer: Self-pay | Admitting: Family Medicine

## 2019-12-01 ENCOUNTER — Other Ambulatory Visit: Payer: Self-pay | Admitting: Family Medicine

## 2019-12-01 ENCOUNTER — Telehealth: Payer: Self-pay

## 2019-12-01 DIAGNOSIS — G8929 Other chronic pain: Secondary | ICD-10-CM

## 2019-12-01 DIAGNOSIS — M5440 Lumbago with sciatica, unspecified side: Secondary | ICD-10-CM

## 2019-12-01 NOTE — Progress Notes (Signed)
Pt requesting to go back to PT - since I have not evaluated her for this, she should schedule OV if she needs other treatment/meds/imaging etc.   Last visit I did with pt it was briefly mentioned as chronic LBP, she stated gabapentin was prescribed by specialist, so she would in in office appt/eval for [place] to take over tx/management.  Delsa Grana, PA-C
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
## 2019-12-13 ENCOUNTER — Ambulatory Visit: Payer: Medicare Other | Admitting: Physical Therapy

## 2019-12-14 ENCOUNTER — Ambulatory Visit
Admission: RE | Admit: 2019-12-14 | Discharge: 2019-12-14 | Disposition: A | Discharge status: Home / Self Care | Payer: Medicare Other | Source: Ambulatory Visit | Attending: Family Medicine | Admitting: Family Medicine

## 2019-12-14 ENCOUNTER — Other Ambulatory Visit: Payer: Self-pay | Admitting: Family Medicine

## 2019-12-14 DIAGNOSIS — R921 Mammographic calcification found on diagnostic imaging of breast: Secondary | ICD-10-CM | POA: Diagnosis not present

## 2019-12-14 DIAGNOSIS — N6311 Unspecified lump in the right breast, upper outer quadrant: Secondary | ICD-10-CM | POA: Diagnosis present

## 2019-12-14 DIAGNOSIS — R928 Other abnormal and inconclusive findings on diagnostic imaging of breast: Secondary | ICD-10-CM

## 2019-12-15 ENCOUNTER — Other Ambulatory Visit: Payer: Self-pay

## 2019-12-15 ENCOUNTER — Encounter: Payer: Self-pay | Admitting: Physical Therapy

## 2019-12-15 ENCOUNTER — Ambulatory Visit: Payer: Medicare Other | Attending: Family Medicine | Admitting: Physical Therapy

## 2019-12-15 DIAGNOSIS — M5442 Lumbago with sciatica, left side: Secondary | ICD-10-CM | POA: Insufficient documentation

## 2019-12-15 DIAGNOSIS — M5441 Lumbago with sciatica, right side: Secondary | ICD-10-CM | POA: Insufficient documentation

## 2019-12-15 DIAGNOSIS — M62830 Muscle spasm of back: Secondary | ICD-10-CM | POA: Diagnosis present

## 2019-12-15 DIAGNOSIS — G8929 Other chronic pain: Secondary | ICD-10-CM | POA: Insufficient documentation
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
## 2019-12-16 ENCOUNTER — Ambulatory Visit: Payer: Medicare Other | Admitting: Physical Therapy

## 2019-12-16 ENCOUNTER — Encounter: Payer: Self-pay | Admitting: Physical Therapy

## 2019-12-16 ENCOUNTER — Encounter: Payer: Medicare Other | Admitting: Physical Therapy

## 2019-12-16 DIAGNOSIS — M62830 Muscle spasm of back: Secondary | ICD-10-CM

## 2019-12-16 DIAGNOSIS — M5442 Lumbago with sciatica, left side: Secondary | ICD-10-CM | POA: Diagnosis not present

## 2019-12-16 DIAGNOSIS — G8929 Other chronic pain: Secondary | ICD-10-CM

## 2019-12-16 NOTE — Addendum Note (Signed)
Addended by: Shelton Silvas on: 12/16/2019 01:59 PM   Modules accepted: Orders
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
## 2019-12-17 ENCOUNTER — Ambulatory Visit: Payer: Medicare Other | Admitting: Physical Therapy

## 2019-12-20 ENCOUNTER — Other Ambulatory Visit: Payer: Self-pay

## 2019-12-20 ENCOUNTER — Ambulatory Visit: Payer: Medicare Other | Admitting: Physical Therapy

## 2019-12-20 ENCOUNTER — Encounter: Payer: Self-pay | Admitting: Physical Therapy

## 2019-12-20 DIAGNOSIS — M5442 Lumbago with sciatica, left side: Secondary | ICD-10-CM | POA: Diagnosis not present

## 2019-12-20 DIAGNOSIS — G8929 Other chronic pain: Secondary | ICD-10-CM

## 2019-12-20 DIAGNOSIS — M62830 Muscle spasm of back: Secondary | ICD-10-CM
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
## 2019-12-21 ENCOUNTER — Encounter: Payer: Medicare Other | Admitting: Physical Therapy

## 2019-12-22 ENCOUNTER — Ambulatory Visit: Payer: Medicare Other | Admitting: Physical Therapy

## 2019-12-22 ENCOUNTER — Ambulatory Visit
Admission: RE | Admit: 2019-12-22 | Discharge: 2019-12-22 | Disposition: A | Discharge status: Home / Self Care | Payer: Medicare Other | Source: Ambulatory Visit | Attending: Family Medicine | Admitting: Family Medicine

## 2019-12-22 ENCOUNTER — Encounter: Payer: Medicare Other | Admitting: Physical Therapy

## 2019-12-22 DIAGNOSIS — R928 Other abnormal and inconclusive findings on diagnostic imaging of breast: Secondary | ICD-10-CM

## 2019-12-22 DIAGNOSIS — R921 Mammographic calcification found on diagnostic imaging of breast: Secondary | ICD-10-CM

## 2019-12-22 HISTORY — PX: BREAST BIOPSY: SHX20

## 2019-12-23 LAB — SURGICAL PATHOLOGY

## 2019-12-27 ENCOUNTER — Other Ambulatory Visit: Payer: Self-pay

## 2019-12-27 ENCOUNTER — Ambulatory Visit: Payer: Medicare Other | Admitting: Physical Therapy

## 2019-12-27 ENCOUNTER — Encounter: Payer: Self-pay | Admitting: Physical Therapy

## 2019-12-27 DIAGNOSIS — M5442 Lumbago with sciatica, left side: Secondary | ICD-10-CM | POA: Diagnosis not present

## 2019-12-27 DIAGNOSIS — M62830 Muscle spasm of back: Secondary | ICD-10-CM

## 2019-12-27 DIAGNOSIS — G8929 Other chronic pain: Secondary | ICD-10-CM
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
## 2019-12-28 ENCOUNTER — Ambulatory Visit (INDEPENDENT_AMBULATORY_CARE_PROVIDER_SITE_OTHER): Payer: Medicare Other | Admitting: General Surgery

## 2019-12-28 ENCOUNTER — Encounter: Payer: Self-pay | Admitting: General Surgery

## 2019-12-28 ENCOUNTER — Other Ambulatory Visit: Payer: Self-pay

## 2019-12-28 VITALS — BP 123/84 | HR 92 | Temp 97.7°F | Resp 14 | Ht 66.0 in | Wt 231.0 lb

## 2019-12-28 DIAGNOSIS — D241 Benign neoplasm of right breast: Secondary | ICD-10-CM | POA: Diagnosis not present

## 2019-12-28 NOTE — Patient Instructions (Signed)
We will notified you around June 2021 to make your 6 month mammogram and office visit for July 2021.  Please call the office if you have any questions or concerns.

## 2019-12-28 NOTE — Progress Notes (Signed)
Patient ID: Elizabeth [name], female   DOB: 07/27/1966, 54 y.o.   MRN: [id number]  Chief Complaint  Patient presents with  . New Patient (Initial Visit)    interductal papiloma dilated ducts right breast    HPI Elizabeth [name] is a 54 y.o. female.   She is here today for further evaluation of a intraductal papilloma.  She reports that on her annual mammography, they have been monitoring an area in the right retroareolar zone.  Previous imaging has been most suggestive of a cluster of benign cysts.  Her most recent mammogram, however, demonstrated a 3 mm cluster of microcalcifications.  She underwent stereotactic core needle biopsy and the final pathology was as follows:  DIAGNOSIS:  A. RIGHT BREAST, RETROAREOLAR; STEREOTACTIC NEEDLE CORE BIOPSY:  - CYSTICALLY DILATED DUCTS WITH INSPISSATED SECRETIONS.  - 2 MM FRAGMENT OF INTRADUCTAL PAPILLOMA.  - FOCAL APOCRINE METAPLASIA WITH LUMINAL CALCIFICATIONS.  - USUAL DUCTAL HYPERPLASIA AND APOCRINE CYSTS.  - NEGATIVE FOR ATYPIA AND MALIGNANCY.   She is here today to discuss further evaluation and management.  Age of menarche was 89, age of menopause was in her early 25s.  She had 3 pregnancies and breast-fed all 3 children.  She used oral contraceptive pills for about 3 months but subsequently reverted to an intrauterine device.  She never took any hormone replacement therapy.  She is never had a prior breast biopsy.  She states that she performs monthly self exams and has never palpated a lump.  She denies any nipple discharge or skin changes.  No breast pain.  There is no family history of breast, uterine, or ovarian cancer.   Past Medical History:  Diagnosis Date  . Bipolar 1 disorder ([place])   . Depression   . Diabetes mellitus without complication ([place])   . Diverticulosis   . GERD (gastroesophageal reflux disease)   . Heart murmur    followed by PCP  . Hepatitis    B - "not active"  . Hypertension goal BP (blood pressure) < 130/80 01/17/2016    . Insomnia   . Irritable bowel syndrome   . Motion sickness    boats  . Obesity (BMI 30-39.9) 07/03/2015  . RA (rheumatoid arthritis) ([place])   . Sciatica     Past Surgical History:  Procedure Laterality Date  . BOWEL RESECTION    . BREAST BIOPSY Right 12/22/2019   affirm bx x marker, path pending  . CHOLECYSTECTOMY  1997   [place], [place]  . COLONOSCOPY WITH PROPOFOL N/A 09/07/2015   Procedure: COLONOSCOPY WITH PROPOFOL;  Surgeon: Lucilla Lame, MD;  Location: [facility];  Service: Endoscopy;  Laterality: N/A;  . ESOPHAGOGASTRODUODENOSCOPY (EGD) WITH ESOPHAGEAL DILATION    . ESOPHAGOGASTRODUODENOSCOPY (EGD) WITH PROPOFOL N/A 09/07/2015   Procedure: ESOPHAGOGASTRODUODENOSCOPY (EGD) WITH PROPOFOL;  Surgeon: Lucilla Lame, MD;  Location: [facility];  Service: Endoscopy;  Laterality: N/A;  . PARTIAL COLECTOMY N/A 01/24/2016   Procedure: SIGMOID COLECTOMY;  Surgeon: Florene Glen, MD;  Location: [facility];  Service: General;  Laterality: N/A;  . POLYPECTOMY N/A 09/07/2015   Procedure: POLYPECTOMY;  Surgeon: Lucilla Lame, MD;  Location: [facility];  Service: Endoscopy;  Laterality: N/A;  . TUBAL LIGATION      Family History  Problem Relation Age of Onset  . Hypertension Mother   . Diabetes Mother   . Arthritis/Rheumatoid Mother   . Hyperlipidemia Mother   . Alcohol abuse Father   . Esophageal varices Father   . Diverticulosis Maternal  Aunt   . Diverticulosis Maternal Grandmother   . Diabetes Maternal Grandmother   . Heart disease Maternal Grandmother   . Dementia Maternal Grandmother   . Diabetes Brother   . Kidney disease Brother   . Aneurysm Maternal Grandfather   . Diabetes Paternal Grandmother   . Healthy Brother   . Depression Daughter        bipolar disorder  . Depression Daughter        bipolar disorder  . Breast cancer Neg Hx     Social History Social History   Tobacco Use  . Smoking status: Former Smoker    Packs/day: 1.00    Years: 1.00     Pack years: 1.00    Types: Cigarettes    Quit date: 05/06/2015    Years since quitting: 4.6  . Smokeless tobacco: Never Used  . Tobacco comment: smoking cessation materials not required  Substance Use Topics  . Alcohol use: No    Alcohol/week: 0.0 standard drinks    Comment: rarely small glass of wine  . Drug use: No    Allergies  Allergen Reactions  . Ace Inhibitors Swelling    Angioedema (specifically from enalapril)  . Enalapril Swelling    angioedema  . Penicillins Hives and Swelling    Has patient had a PCN reaction causing immediate rash, facial/tongue/throat swelling, SOB or lightheadedness with hypotension: Yes Has patient had a PCN reaction causing severe rash involving mucus membranes or skin necrosis: No Has patient had a PCN reaction that required hospitalization Yes Has patient had a PCN reaction occurring within the last 10 years: No If all of the above answers are "NO", then may proceed with Cephalosporin use.  . Statins Hives    Current Outpatient Medications  Medication Sig Dispense Refill  . Accu-Chek FastClix Lancets MISC USE TO CHECK BLOOD SUGAR TWICE DAILY 100 each 5  . ACCU-CHEK SMARTVIEW test strip USE TO TEST ONCE DAILY 100 strip 0  . aspirin 81 MG tablet Take 81 mg by mouth daily.    Marland Kitchen atorvastatin (LIPITOR) 10 MG tablet TAKE 1 TABLET BY MOUTH AT BEDTIME 90 tablet 1  . azelastine (ASTELIN) 0.1 % nasal spray Place 2 sprays into both nostrils 2 (two) times daily. Use in each nostril as directed 30 mL 12  . B-D INS SYR ULTRAFINE 1CC/31G 31G X 5/16" 1 ML MISC use as directed TO INJECT METHOTREXATE  1  . Blood Glucose Monitoring Suppl (ACCU-CHEK NANO SMARTVIEW) w/Device KIT Use as directed once daily. LON 99, E11.9 1 kit 0  . cetirizine (ZYRTEC) 10 MG tablet Take 10 mg by mouth as needed for allergies.    . cholecalciferol (VITAMIN D) 1000 units tablet Take 1,000 Units by mouth daily.    . fluticasone (FLONASE) 50 MCG/ACT nasal spray     . folic acid  (FOLVITE) 1 MG tablet Take 1 mg by mouth daily.    Marland Kitchen gabapentin (NEURONTIN) 300 MG capsule TAKE 1 CAPSULE BY MOUTH EVERY MORNING TAKE 1 CAPSULE AT NOON AND TAKE 2 CAPSULAS AT BEDTIME 360 capsule 0  . Lifitegrast (XIIDRA) 5 % SOLN 2 (two) times daily    . LINZESS 290 MCG CAPS capsule TAKE 1 CAPSULE(290 MCG) BY MOUTH DAILY 90 capsule 0  . metFORMIN (GLUCOPHAGE XR) 500 MG 24 hr tablet Take 1 tablet (500 mg total) by mouth 2 (two) times daily. 90 tablet 0  . methotrexate 50 MG/2ML injection Take 0.6 ml SQ once a week x 12 weeks 4 vials  of 2 ml for 12 weeks    . Multiple Vitamins-Minerals (MULTIVITAMIN/EXTRA VITAMIN D3 PO) Take by mouth.    . OTEZLA 30 MG TABS     . traZODone (DESYREL) 150 MG tablet Take 150 mg by mouth at bedtime.  0  . triamcinolone ointment (KENALOG) 0.5 % Apply 1 application topically 2 (two) times daily. 60 g 1   No current facility-administered medications for this visit.    Review of Systems Review of Systems  All other systems reviewed and are negative. Are as per the history of present illness  Blood pressure 123/84, pulse 92, temperature 97.7 F (36.5 C), resp. rate 14, height _0  (1.676 m), weight 231 lb (104.8 kg), last menstrual period 07/09/2014, SpO2 97 %. Body mass index is 37.28 kg/m.  Physical Exam Physical Exam Vitals reviewed. Exam conducted with a chaperone present.  Constitutional:      General: She is not in acute distress.    Appearance: She is obese.  HENT:     Head: Normocephalic and atraumatic.     Nose:     Comments: Covered with a mask secondary to COVID-19 precautions    Mouth/Throat:     Comments: Covered with a mask secondary to COVID-19 precautions Eyes:     General: No scleral icterus.       Right eye: No discharge.        Left eye: No discharge.     Comments: Wearing glasses.  Neck:     Comments: No palpable thyromegaly or dominant thyroid masses identified. Cardiovascular:     Rate and Rhythm: Normal rate and regular  rhythm.  Pulmonary:     Effort: Pulmonary effort is normal. No respiratory distress.  Chest:     Breasts:        Right: Normal.        Left: Normal.       Comments: Dressing in place from prior stereotactic core biopsy.  Dense fibroglandular breast tissue bilaterally. Genitourinary:    Comments: Deferred Musculoskeletal:        General: Normal range of motion.     Cervical back: Normal range of motion and neck supple.  Lymphadenopathy:     Cervical: No cervical adenopathy.     Upper Body:     Right upper body: No supraclavicular, axillary or pectoral adenopathy.     Left upper body: No supraclavicular, axillary or pectoral adenopathy.  Skin:    General: Skin is dry.  Neurological:     General: No focal deficit present.     Mental Status: She is alert and oriented to person, place, and time.  Psychiatric:        Mood and Affect: Mood normal.        Behavior: Behavior normal.     Data Reviewed I reviewed both the reports and the images of multiple mammographic studies dating back to 2016.  The most recent is copied here: CLINICAL DATA:  54 year old female for 2 year follow-up of RIGHT breast mass and for annual bilateral mammograms.  EXAM: DIGITAL DIAGNOSTIC BILATERAL MAMMOGRAM WITH CAD AND TOMO  ULTRASOUND RIGHT BREAST  COMPARISON:  Previous exam(s).  ACR Breast Density Category b: There are scattered areas of fibroglandular density.  FINDINGS: 2D/3D full field views of both breasts and magnification views of the RIGHT breast demonstrate an unchanged small circumscribed oval mass within the UPPER-OUTER RIGHT breast.  A new 0.3 cm group of slightly heterogeneous calcifications are noted in the RETROAREOLAR RIGHT breast.  No  new mass or distortion identified within either breast.  Mammographic images were processed with CAD.  Targeted ultrasound is performed, showing a 0.6 x 0.4 x 0.6 cm circumscribed hypoechoic mass at the 11:30 position of the  RIGHT breast 5 cm from the nipple, decreased in size from 12/16/2017 and compatible with a benign process.  IMPRESSION: 1. New 0.3 cm group of indeterminate calcifications within the RETROAREOLAR RIGHT breast. Tissue sampling is recommended. 2. UPPER OUTER RIGHT breast mass, decreased in size from 2 years ago and compatible with a benign process. No further imaging follow-up of this mass recommended. 3. No other significant abnormalities.  RECOMMENDATION: 3D/stereotactic guided RIGHT breast biopsy, which will be arranged.  I also reviewed the post biopsy images and agree with the radiologist interpretation which is copied here: CLINICAL DATA:  Stereotactic biopsy was performed of a 3 mm group of calcifications in the immediate retroareolar lower inner quadrant right breast.  EXAM: DIAGNOSTIC RIGHT MAMMOGRAM POST STEREOTACTIC BIOPSY  COMPARISON:  Previous exam(s).  FINDINGS: Mammographic images were obtained following stereotactic guided biopsy of calcifications in the medial retroareolar right breast. The biopsy marking clip is in expected position at the site of biopsy.  IMPRESSION: Appropriate positioning of the X shaped biopsy marking clip at the site of biopsy in the immediate retroareolar right breast, lower inner quadrant.  The pathology was discussed above under the history of present illness.  Assessment This is a 54 year old postmenopausal woman who has no significant risk factors for breast cancer.  A single fragment of intraductal papilloma without any atypia was identified on a core biopsy of a 3 mm area of calcifications.  We discussed the range of options and potential risk of upstaging the lesion on excisional biopsy.  Based upon the data available, I think her risk of upstaging is extremely low, but I told her that the only way to know for sure would be to perform an excision of the area.  She is concerned about the cosmetic effect on her breast; I  reassured her that I did not think it would be significant.  For now, she would prefer to engage in close surveillance.  She understands that if changes are identified, we would need to proceed with either a repeat core biopsy or wire localized lumpectomy.  Plan We will schedule her for repeat imaging in 6 months time followed by a clinic visit with me.    Fredirick Maudlin 12/28/2019, 10:15 AM

## 2019-12-29 ENCOUNTER — Encounter: Payer: Medicare Other | Admitting: Physical Therapy

## 2019-12-29 ENCOUNTER — Ambulatory Visit: Payer: Medicare Other | Admitting: Physical Therapy

## 2019-12-30 ENCOUNTER — Other Ambulatory Visit: Payer: Self-pay | Admitting: Family Medicine

## 2019-12-31 ENCOUNTER — Encounter: Payer: Medicare Other | Admitting: Physical Therapy

## 2020-01-03 ENCOUNTER — Encounter: Payer: Self-pay | Admitting: Physical Therapy

## 2020-01-03 ENCOUNTER — Ambulatory Visit: Payer: Medicare Other | Admitting: Physical Therapy

## 2020-01-03 ENCOUNTER — Other Ambulatory Visit: Payer: Self-pay

## 2020-01-03 DIAGNOSIS — G8929 Other chronic pain: Secondary | ICD-10-CM

## 2020-01-03 DIAGNOSIS — M5442 Lumbago with sciatica, left side: Secondary | ICD-10-CM

## 2020-01-03 DIAGNOSIS — M62830 Muscle spasm of back: Secondary | ICD-10-CM
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
## 2020-01-05 ENCOUNTER — Ambulatory Visit: Payer: Medicare Other | Admitting: Physical Therapy

## 2020-01-05 ENCOUNTER — Encounter: Payer: Self-pay | Admitting: Physical Therapy

## 2020-01-05 ENCOUNTER — Other Ambulatory Visit: Payer: Self-pay

## 2020-01-05 ENCOUNTER — Encounter: Payer: Medicare Other | Admitting: Physical Therapy

## 2020-01-05 DIAGNOSIS — M5442 Lumbago with sciatica, left side: Secondary | ICD-10-CM | POA: Diagnosis not present

## 2020-01-05 DIAGNOSIS — G8929 Other chronic pain: Secondary | ICD-10-CM

## 2020-01-05 DIAGNOSIS — M62830 Muscle spasm of back: Secondary | ICD-10-CM
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
## 2020-01-06 ENCOUNTER — Telehealth: Payer: Medicare Other | Admitting: Physician Assistant

## 2020-01-06 ENCOUNTER — Ambulatory Visit: Payer: Medicaid Other | Admitting: Family Medicine

## 2020-01-06 ENCOUNTER — Telehealth: Payer: Self-pay | Admitting: Internal Medicine

## 2020-01-06 DIAGNOSIS — R21 Rash and other nonspecific skin eruption: Secondary | ICD-10-CM

## 2020-01-06 NOTE — Telephone Encounter (Signed)
Pt is needing a referral to a dermatology office due to a rash. She saw Benjamine Mola this past summer for this and Benjamine Mola had put a referral in. Pt declined the referral at the time because Dr Meda Coffee at [facility] had sent in the referral. He sent her to [facility] and she does not like it there. Is it possible to send her someplace else. Pt has chosen Dr BorgWarner as her new pcp here but is not able to see him just yet due to he currently not credentialed with her insurance

## 2020-01-06 NOTE — Progress Notes (Signed)
Based on what you shared with me, I feel your condition warrants further evaluation and I recommend that you be seen for a face to face visit.  Please contact your primary care physician practice to be seen. Many offices offer virtual options to be seen via video if you are not comfortable going in person to a medical facility at this time.  Unfortunately, [facility] does not provide referrals to other specialties. You will need to call your PCP's office or send your PCP a direct message for a referral to a dermatologist for the rash.   If you do not have a PCP,  offers a free physician referral service available at [phone]. Our trained staff has the experience, knowledge and resources to put you in touch with a physician who is right for you.   You also have the option of a video visit through [email or web address]  If you are having a true medical emergency please call 911.  NOTE: If you entered your credit card information for this eVisit, you will not be charged. You may see a "hold" on your card for the $35 but that hold will drop off and you will not have a charge processed.  Your e-visit answers were reviewed by a board certified advanced clinical practitioner to complete your personal care plan.  Thank you for using e-Visits.  Greater than 5 minutes, yet less than 10 minutes of time have been spent researching, coordinating, and implementing care for this patient today.

## 2020-01-07 ENCOUNTER — Ambulatory Visit (INDEPENDENT_AMBULATORY_CARE_PROVIDER_SITE_OTHER): Payer: Medicare Other | Admitting: Family Medicine

## 2020-01-07 ENCOUNTER — Encounter: Payer: Self-pay | Admitting: Family Medicine

## 2020-01-07 ENCOUNTER — Encounter: Payer: Medicare Other | Admitting: Physical Therapy

## 2020-01-07 ENCOUNTER — Other Ambulatory Visit: Payer: Self-pay

## 2020-01-07 VITALS — BP 124/78 | HR 84 | Temp 97.9°F | Resp 16 | Ht 66.0 in | Wt 229.1 lb

## 2020-01-07 DIAGNOSIS — D849 Immunodeficiency, unspecified: Secondary | ICD-10-CM | POA: Diagnosis not present

## 2020-01-07 DIAGNOSIS — L403 Pustulosis palmaris et plantaris: Secondary | ICD-10-CM | POA: Insufficient documentation

## 2020-01-07 DIAGNOSIS — L301 Dyshidrosis [pompholyx]: Secondary | ICD-10-CM | POA: Insufficient documentation

## 2020-01-07 MED ORDER — TRIAMCINOLONE ACETONIDE 0.5 % EX OINT: 1.0000 "application " | TOPICAL_OINTMENT | Freq: Two times a day (BID) | CUTANEOUS | 1 refills | Status: DC

## 2020-01-07 NOTE — Progress Notes (Signed)
Name: Elizabeth [name]   MRN: [id number]    DOB: Oct 20, 1966   Date:01/07/2020       Progress Note  Subjective  Chief Complaint  Chief Complaint  Patient presents with  . Referral    dermatology, rash no better    HPI  Pt presents with concern for rash of the hands that is pruritic and does crack at times - has been ongoing for over least 6 months.  She was seen by Benjamine Mola NP-C in our clinic April 2020 and again June 2020 - given triamcinolone ointment initially, then referred to dermatology when it did not improve.  She went to see [facility] with Dr. Nehemiah Massed but things did not improve despite Rx for [facility] and she would like a second opinion.   She notes that she recently bought some antifungal cream on both hands and feet and she felt that she could see some improvement, however she also notes that her triamcinolone ointment also helps, and needs refill today. - Of note she does have RA and is taking methotrexate, doing well on this.  Dr. Meda Coffee noted in August 2020 that patient does in fact have psoriasis diagnosis (though patient reports unaware of this diagnosis). - No bleeding, drainage, fevers/chills, no redness.  Patient Active Problem List   Diagnosis Date Noted  . Intraductal papilloma of breast, right 12/28/2019  . Psoriasis 08/06/2019  . Immunosuppressed status ([place]) 07/01/2019  . Morbid obesity ([place]) 07/01/2019  . Obesity (BMI 30.0-34.9) 09/29/2018  . Vaginal atrophy 01/01/2017  . Hyperlipidemia LDL goal <70 10/04/2016  . Diverticula of colon 01/24/2016  . Hypertension goal BP (blood pressure) < 130/80 01/17/2016  . Bilateral carpal tunnel syndrome 01/03/2016  . Bursitis, trochanteric 11/22/2015  . Inversion, nipple 11/08/2015  . Type 2 diabetes mellitus with hyperglycemia, without long-term current use of insulin ([place]) 10/23/2015  . Rheumatoid arthritis involving both hands ([place]) 10/23/2015  . Benign neoplasm of descending colon   . Benign neoplasm of sigmoid  colon   . Bipolar disorder, current episode depressed, moderate ([place]) 07/03/2015  . IBS (irritable bowel syndrome) 07/03/2015  . Insomnia, uncontrolled 07/03/2015  . GERD without esophagitis 07/03/2015  . Bilateral hip bursitis 07/03/2015    Social History   Tobacco Use  . Smoking status: Former Smoker    Packs/day: 1.00    Years: 1.00    Pack years: 1.00    Types: Cigarettes    Quit date: 05/06/2015    Years since quitting: 4.6  . Smokeless tobacco: Never Used  . Tobacco comment: smoking cessation materials not required  Substance Use Topics  . Alcohol use: No    Alcohol/week: 0.0 standard drinks    Comment: rarely small glass of wine    Current Outpatient Medications:  .  metFORMIN (GLUCOPHAGE-XR) 500 MG 24 hr tablet, TAKE 1 TABLET(500 MG) BY MOUTH TWICE DAILY, Disp: 90 tablet, Rfl: 0 .  Accu-Chek FastClix Lancets MISC, USE TO CHECK BLOOD SUGAR TWICE DAILY, Disp: 100 each, Rfl: 5 .  ACCU-CHEK SMARTVIEW test strip, USE TO TEST ONCE DAILY, Disp: 100 strip, Rfl: 0 .  aspirin 81 MG tablet, Take 81 mg by mouth daily., Disp: , Rfl:  .  atorvastatin (LIPITOR) 10 MG tablet, TAKE 1 TABLET BY MOUTH AT BEDTIME, Disp: 90 tablet, Rfl: 1 .  azelastine (ASTELIN) 0.1 % nasal spray, Place 2 sprays into both nostrils 2 (two) times daily. Use in each nostril as directed, Disp: 30 mL, Rfl: 12 .  B-D INS SYR ULTRAFINE  1CC/31G 31G X 5/16" 1 ML MISC, use as directed TO INJECT METHOTREXATE, Disp: , Rfl: 1 .  Blood Glucose Monitoring Suppl (ACCU-CHEK NANO SMARTVIEW) w/Device KIT, Use as directed once daily. LON 99, E11.9, Disp: 1 kit, Rfl: 0 .  cetirizine (ZYRTEC) 10 MG tablet, Take 10 mg by mouth as needed for allergies., Disp: , Rfl:  .  cholecalciferol (VITAMIN D) 1000 units tablet, Take 1,000 Units by mouth daily., Disp: , Rfl:  .  fluticasone (FLONASE) 50 MCG/ACT nasal spray, , Disp: , Rfl:  .  folic acid (FOLVITE) 1 MG tablet, Take 1 mg by mouth daily., Disp: , Rfl:  .  gabapentin (NEURONTIN)  300 MG capsule, TAKE 1 CAPSULE BY MOUTH EVERY MORNING TAKE 1 CAPSULE AT NOON AND TAKE 2 CAPSULAS AT BEDTIME, Disp: 360 capsule, Rfl: 0 .  Lifitegrast (XIIDRA) 5 % SOLN, 2 (two) times daily, Disp: , Rfl:  .  LINZESS 290 MCG CAPS capsule, TAKE 1 CAPSULE(290 MCG) BY MOUTH DAILY, Disp: 90 capsule, Rfl: 0 .  methotrexate 50 MG/2ML injection, Take 0.6 ml SQ once a week x 12 weeks 4 vials of 2 ml for 12 weeks, Disp: , Rfl:  .  Multiple Vitamins-Minerals (MULTIVITAMIN/EXTRA VITAMIN D3 PO), Take by mouth., Disp: , Rfl:  .  OTEZLA 30 MG TABS, , Disp: , Rfl:  .  traZODone (DESYREL) 150 MG tablet, Take 150 mg by mouth at bedtime., Disp: , Rfl: 0 .  triamcinolone ointment (KENALOG) 0.5 %, Apply 1 application topically 2 (two) times daily., Disp: 60 g, Rfl: 1  Allergies  Allergen Reactions  . Ace Inhibitors Swelling    Angioedema (specifically from enalapril)  . Enalapril Swelling    angioedema  . Penicillins Hives and Swelling    Has patient had a PCN reaction causing immediate rash, facial/tongue/throat swelling, SOB or lightheadedness with hypotension: Yes Has patient had a PCN reaction causing severe rash involving mucus membranes or skin necrosis: No Has patient had a PCN reaction that required hospitalization Yes Has patient had a PCN reaction occurring within the last 10 years: No If all of the above answers are "NO", then may proceed with Cephalosporin use.  . Statins Hives    I personally reviewed active problem list, medication list, allergies, notes from last encounter, lab results with the patient/caregiver today.  ROS  Ten systems reviewed and is negative except as mentioned in HPI  Objective  Vitals:   01/07/20 1101  BP: 124/78  Pulse: 84  Resp: 16  Temp: 97.9 F (36.6 C)  TempSrc: Temporal  SpO2: 98%  Weight: 229 lb 1.6 oz (103.9 kg)  Height: '5\' 6"'  (1.676 m)    Body mass index is 36.98 kg/m.  Nursing Note and Vital Signs reviewed.  Physical Exam  Constitutional:  Patient appears well-developed and well-nourished. No distress.  HENT: Head: Normocephalic and atraumatic.  Eyes: Conjunctivae and EOM are normal. No scleral icterus.  Neck: Normal range of motion. Neck supple. No JVD present.  Cardiovascular: Normal rate, regular rhythm and normal heart sounds.  No murmur heard. No BLE edema. Pulmonary/Chest: Effort normal and breath sounds normal. No respiratory distress. Musculoskeletal: Normal range of motion, no joint effusions. No gross deformities Neurological: Pt is alert and oriented to person, place, and time. No cranial nerve deficit. Coordination, balance, strength, speech and gait are normal.  Skin: Skin is warm and dry.  There is thin scaling to bilateral hands and fingers, no plaques present, no erythema or drainage, no fissures.  The bilateral  feet have multiple scaling plaques without fissures, erythema, or drainage. Psychiatric: Patient has a normal mood and affect. behavior is normal. Judgment and thought content normal.  No results found for this or any previous visit (from the past 72 hour(s)).  Assessment & Plan  1. Dyshidrotic eczema - Ambulatory referral to Dermatology - triamcinolone ointment (KENALOG) 0.5 %; Apply 1 application topically 2 (two) times daily.  Dispense: 120 g; Refill: 1 - Suspect combination of psoriasis and dyshidrotic eczema as patient confirms she washes her hands and washes dishes quite frequently - advised to avoid submerging hands in water as much as possible, keep hands clean and dry, use lukewarm water, avoid very hot water.  Referral as above.  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  Dispense: 120 g; Refill: 1  3. Immunosuppressed status ([place]) - On Methotrexate.

## 2020-01-07 NOTE — Patient Instructions (Signed)
Please check with Dr. Meda Coffee about obtaining the COVID-19 vaccine.  If Dr. Meda Coffee is ok with this, you are cleared from our perspective to obtain the vaccine.

## 2020-01-10 ENCOUNTER — Other Ambulatory Visit: Payer: Self-pay

## 2020-01-10 ENCOUNTER — Encounter: Payer: Self-pay | Admitting: Family Medicine

## 2020-01-10 ENCOUNTER — Telehealth: Payer: Self-pay

## 2020-01-10 ENCOUNTER — Encounter: Payer: Self-pay | Admitting: Physical Therapy

## 2020-01-10 ENCOUNTER — Ambulatory Visit: Payer: Medicare Other | Attending: Family Medicine | Admitting: Physical Therapy

## 2020-01-10 DIAGNOSIS — G8929 Other chronic pain: Secondary | ICD-10-CM | POA: Diagnosis present

## 2020-01-10 DIAGNOSIS — Z780 Asymptomatic menopausal state: Secondary | ICD-10-CM

## 2020-01-10 DIAGNOSIS — M5442 Lumbago with sciatica, left side: Secondary | ICD-10-CM | POA: Diagnosis present

## 2020-01-10 DIAGNOSIS — M5441 Lumbago with sciatica, right side: Secondary | ICD-10-CM | POA: Diagnosis present

## 2020-01-10 DIAGNOSIS — M62830 Muscle spasm of back: Secondary | ICD-10-CM | POA: Diagnosis present

## 2020-01-10 DIAGNOSIS — N898 Other specified noninflammatory disorders of vagina: Secondary | ICD-10-CM
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
## 2020-01-10 NOTE — Telephone Encounter (Signed)
Referral provided 

## 2020-01-12 ENCOUNTER — Other Ambulatory Visit: Payer: Self-pay

## 2020-01-12 ENCOUNTER — Encounter: Payer: Self-pay | Admitting: Physical Therapy

## 2020-01-12 ENCOUNTER — Ambulatory Visit: Payer: Medicare Other | Admitting: Physical Therapy

## 2020-01-12 DIAGNOSIS — M5442 Lumbago with sciatica, left side: Secondary | ICD-10-CM | POA: Diagnosis not present

## 2020-01-12 DIAGNOSIS — M62830 Muscle spasm of back: Secondary | ICD-10-CM

## 2020-01-12 DIAGNOSIS — G8929 Other chronic pain: Secondary | ICD-10-CM
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
## 2020-01-17 ENCOUNTER — Ambulatory Visit: Payer: Medicare Other | Admitting: Physical Therapy

## 2020-01-17 ENCOUNTER — Other Ambulatory Visit: Payer: Self-pay

## 2020-01-17 ENCOUNTER — Encounter: Payer: Self-pay | Admitting: Physical Therapy

## 2020-01-17 DIAGNOSIS — M62830 Muscle spasm of back: Secondary | ICD-10-CM

## 2020-01-17 DIAGNOSIS — G8929 Other chronic pain: Secondary | ICD-10-CM

## 2020-01-17 DIAGNOSIS — M5442 Lumbago with sciatica, left side: Secondary | ICD-10-CM
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
## 2020-01-19 ENCOUNTER — Ambulatory Visit: Payer: Medicare Other | Admitting: Physical Therapy

## 2020-01-19 ENCOUNTER — Other Ambulatory Visit: Payer: Self-pay

## 2020-01-19 ENCOUNTER — Encounter: Payer: Self-pay | Admitting: Physical Therapy

## 2020-01-19 DIAGNOSIS — M62830 Muscle spasm of back: Secondary | ICD-10-CM

## 2020-01-19 DIAGNOSIS — M5442 Lumbago with sciatica, left side: Secondary | ICD-10-CM

## 2020-01-19 DIAGNOSIS — G8929 Other chronic pain: Secondary | ICD-10-CM
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
## 2020-01-20 ENCOUNTER — Ambulatory Visit (INDEPENDENT_AMBULATORY_CARE_PROVIDER_SITE_OTHER): Payer: Medicare Other | Admitting: Internal Medicine

## 2020-01-20 ENCOUNTER — Encounter: Payer: Self-pay | Admitting: Internal Medicine

## 2020-01-20 ENCOUNTER — Ambulatory Visit: Payer: Medicare Other | Admitting: Internal Medicine

## 2020-01-20 VITALS — BP 128/74 | HR 81 | Temp 97.1°F | Resp 16 | Ht 66.0 in | Wt 234.5 lb

## 2020-01-20 DIAGNOSIS — D241 Benign neoplasm of right breast: Secondary | ICD-10-CM

## 2020-01-20 DIAGNOSIS — I10 Essential (primary) hypertension: Secondary | ICD-10-CM

## 2020-01-20 DIAGNOSIS — M05742 Rheumatoid arthritis with rheumatoid factor of left hand without organ or systems involvement: Secondary | ICD-10-CM

## 2020-01-20 DIAGNOSIS — E785 Hyperlipidemia, unspecified: Secondary | ICD-10-CM | POA: Diagnosis not present

## 2020-01-20 DIAGNOSIS — F3132 Bipolar disorder, current episode depressed, moderate: Secondary | ICD-10-CM

## 2020-01-20 DIAGNOSIS — M549 Dorsalgia, unspecified: Secondary | ICD-10-CM

## 2020-01-20 DIAGNOSIS — M5441 Lumbago with sciatica, right side: Secondary | ICD-10-CM

## 2020-01-20 DIAGNOSIS — E1165 Type 2 diabetes mellitus with hyperglycemia: Secondary | ICD-10-CM | POA: Diagnosis not present

## 2020-01-20 DIAGNOSIS — M5442 Lumbago with sciatica, left side: Secondary | ICD-10-CM

## 2020-01-20 DIAGNOSIS — M05741 Rheumatoid arthritis with rheumatoid factor of right hand without organ or systems involvement: Secondary | ICD-10-CM

## 2020-01-20 DIAGNOSIS — T733XXA Exhaustion due to excessive exertion, initial encounter: Secondary | ICD-10-CM

## 2020-01-20 DIAGNOSIS — G8929 Other chronic pain: Secondary | ICD-10-CM

## 2020-01-20 DIAGNOSIS — R1033 Periumbilical pain: Secondary | ICD-10-CM

## 2020-01-20 MED ORDER — CYCLOBENZAPRINE HCL 5 MG PO TABS: 5.0000 mg | ORAL_TABLET | Freq: Every day | ORAL | 2 refills | Status: DC
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
## 2020-01-20 NOTE — Patient Instructions (Signed)

## 2020-01-21 LAB — CBC WITH DIFFERENTIAL/PLATELET
Absolute Monocytes: 359 cells/uL (ref 200–950)
Basophils Absolute: 31 cells/uL (ref 0–200)
Basophils Relative: 0.6 %
Eosinophils Absolute: 109 cells/uL (ref 15–500)
Eosinophils Relative: 2.1 %
HCT: 37.3 % (ref 35.0–45.0)
Hemoglobin: 12.5 g/dL (ref 11.7–15.5)
Lymphs Abs: 2002 cells/uL (ref 850–3900)
MCH: 31.6 pg (ref 27.0–33.0)
MCHC: 33.5 g/dL (ref 32.0–36.0)
MCV: 94.2 fL (ref 80.0–100.0)
MPV: 10.2 fL (ref 7.5–12.5)
Monocytes Relative: 6.9 %
Neutro Abs: 2699 cells/uL (ref 1500–7800)
Neutrophils Relative %: 51.9 %
Platelets: 247 10*3/uL (ref 140–400)
RBC: 3.96 10*6/uL (ref 3.80–5.10)
RDW: 13.1 % (ref 11.0–15.0)
Total Lymphocyte: 38.5 %
WBC: 5.2 10*3/uL (ref 3.8–10.8)

## 2020-01-21 LAB — LIPID PANEL
Cholesterol: 139 mg/dL (ref <200)
HDL: 47 mg/dL — ABNORMAL LOW (ref >=50)
LDL Cholesterol (Calc): 62 mg/dL (calc)
Non-HDL Cholesterol (Calc): 92 mg/dL (calc) (ref <130)
Total CHOL/HDL Ratio: 3 (calc) (ref <5.0)
Triglycerides: 252 mg/dL — ABNORMAL HIGH (ref <150)

## 2020-01-21 LAB — COMPLETE METABOLIC PANEL WITH GFR
AG Ratio: 1.6 (calc) (ref 1.0–2.5)
ALT: 36 U/L — ABNORMAL HIGH (ref 6–29)
AST: 17 U/L (ref 10–35)
Albumin: 4.2 g/dL (ref 3.6–5.1)
Alkaline phosphatase (APISO): 81 U/L (ref 37–153)
BUN: 12 mg/dL (ref 7–25)
CO2: 28 mmol/L (ref 20–32)
Calcium: 9.1 mg/dL (ref 8.6–10.4)
Chloride: 107 mmol/L (ref 98–110)
Creat: 0.8 mg/dL (ref 0.50–1.05)
GFR, Est African American: 98 mL/min/{1.73_m2} (ref >=60)
GFR, Est Non African American: 85 mL/min/{1.73_m2} (ref >=60)
Globulin: 2.6 g/dL (calc) (ref 1.9–3.7)
Glucose, Bld: 158 mg/dL — ABNORMAL HIGH (ref 65–99)
Potassium: 4.3 mmol/L (ref 3.5–5.3)
Sodium: 139 mmol/L (ref 135–146)
Total Bilirubin: 0.3 mg/dL (ref 0.2–1.2)
Total Protein: 6.8 g/dL (ref 6.1–8.1)

## 2020-01-21 LAB — HEMOGLOBIN A1C
Hgb A1c MFr Bld: 7.3 % of total Hgb — ABNORMAL HIGH (ref <5.7)
Mean Plasma Glucose: 163 (calc)
eAG (mmol/L): 9 (calc)

## 2020-01-21 LAB — TSH: TSH: 2.12 mIU/L

## 2020-01-21 NOTE — Progress Notes (Signed)
Elizabeth [name], It was nice meeting you and I want to follow-up on the lab results;  The labs in general were just a little worse compared to the most recent ones prior.  The A1c was just a little bit higher, at 7.3.  The glucose was also just a little higher at 158.  The lipid panel showed that your triglycerides were slightly higher at 252, with the good news being your LDL-cholesterol (bad type) was lower at 62, and the HDL cholesterol (good type) was higher at 47.  More good news is that the complete blood count and the thyroid screening test (TSH) were all good, as was the remainder of your complete metabolic panel which included your kidney function tests, electrolytes, and liver function tests. I would strongly encourage really trying to work on diet modifications and trying to increase physical activity some to help at this point, and if we do not see improvements or perhaps a little worsening on follow-up blood tests, we may consider adding a medicine like Vascepa which helps with higher triglycerides and lowering the potential of heart disease in the future.  Dr. Roxan Hockey

## 2020-01-24 ENCOUNTER — Ambulatory Visit: Payer: Medicare Other | Admitting: Physical Therapy

## 2020-01-26 ENCOUNTER — Ambulatory Visit: Payer: Medicare Other | Admitting: Physical Therapy

## 2020-01-31 ENCOUNTER — Other Ambulatory Visit: Payer: Self-pay

## 2020-01-31 ENCOUNTER — Encounter: Payer: Self-pay | Admitting: Physical Therapy

## 2020-01-31 ENCOUNTER — Ambulatory Visit: Payer: Medicare Other | Admitting: Physical Therapy

## 2020-01-31 DIAGNOSIS — G8929 Other chronic pain: Secondary | ICD-10-CM

## 2020-01-31 DIAGNOSIS — M5442 Lumbago with sciatica, left side: Secondary | ICD-10-CM | POA: Diagnosis not present

## 2020-01-31 DIAGNOSIS — M62830 Muscle spasm of back: Secondary | ICD-10-CM
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
## 2020-02-02 ENCOUNTER — Ambulatory Visit: Payer: Medicare Other | Admitting: Physical Therapy

## 2020-02-09 ENCOUNTER — Encounter: Payer: Self-pay | Admitting: Physical Therapy

## 2020-02-09 ENCOUNTER — Other Ambulatory Visit: Payer: Self-pay | Admitting: Internal Medicine

## 2020-02-09 ENCOUNTER — Ambulatory Visit: Payer: Medicare Other | Attending: Family Medicine | Admitting: Physical Therapy

## 2020-02-09 ENCOUNTER — Other Ambulatory Visit: Payer: Self-pay

## 2020-02-09 DIAGNOSIS — M62830 Muscle spasm of back: Secondary | ICD-10-CM | POA: Insufficient documentation

## 2020-02-09 DIAGNOSIS — G8929 Other chronic pain: Secondary | ICD-10-CM | POA: Diagnosis present

## 2020-02-09 DIAGNOSIS — M5442 Lumbago with sciatica, left side: Secondary | ICD-10-CM | POA: Diagnosis present

## 2020-02-09 DIAGNOSIS — M5441 Lumbago with sciatica, right side: Secondary | ICD-10-CM

## 2020-02-09 MED ORDER — METFORMIN HCL ER 500 MG PO TB24: 500.0000 mg | ORAL_TABLET | Freq: Two times a day (BID) | ORAL | 1 refills | Status: DC

## 2020-02-09 NOTE — Progress Notes (Signed)
Metformin refilled
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
## 2020-02-11 ENCOUNTER — Other Ambulatory Visit: Payer: Self-pay

## 2020-02-11 MED ORDER — ATORVASTATIN CALCIUM 10 MG PO TABS: 10.0000 mg | ORAL_TABLET | Freq: Every day | ORAL | 3 refills | Status: DC

## 2020-02-15 ENCOUNTER — Ambulatory Visit: Payer: Medicare Other | Admitting: Physical Therapy

## 2020-02-15 ENCOUNTER — Other Ambulatory Visit: Payer: Self-pay

## 2020-02-15 MED ORDER — LINACLOTIDE 290 MCG PO CAPS: ORAL_CAPSULE | ORAL | 1 refills | Status: DC

## 2020-02-17 ENCOUNTER — Encounter: Payer: Medicare Other | Admitting: Physical Therapy

## 2020-02-22 ENCOUNTER — Encounter: Payer: Medicare Other | Admitting: Physical Therapy

## 2020-02-24 ENCOUNTER — Encounter: Payer: Medicare Other | Admitting: Physical Therapy

## 2020-02-25 NOTE — Progress Notes (Signed)
Pt present today as a referral from [facility] for vaginal dryness and menopause symptoms. Pt stated that she has been noticing vaginal dryness for several months now and is causing sex to be painful.

## 2020-02-28 ENCOUNTER — Encounter: Payer: Self-pay | Admitting: Certified Nurse Midwife

## 2020-02-28 ENCOUNTER — Ambulatory Visit (INDEPENDENT_AMBULATORY_CARE_PROVIDER_SITE_OTHER): Payer: Medicare Other | Admitting: Certified Nurse Midwife

## 2020-02-28 ENCOUNTER — Other Ambulatory Visit: Payer: Self-pay

## 2020-02-28 VITALS — BP 130/78 | HR 93 | Ht 66.0 in | Wt 238.3 lb

## 2020-02-28 DIAGNOSIS — N941 Unspecified dyspareunia: Secondary | ICD-10-CM | POA: Diagnosis not present

## 2020-02-28 DIAGNOSIS — N951 Menopausal and female climacteric states: Secondary | ICD-10-CM | POA: Diagnosis not present

## 2020-02-28 NOTE — Progress Notes (Signed)
GYN ENCOUNTER NOTE  Subjective:       Elizabeth [name] is a 54 y.o. No obstetric history on file. female is here for gynecologic evaluation of the following issues:  1. Vaginal dryness; feels like needles and pins 2. Dyspareunia  Has used lubricants and Vaseline in the past with minimal relief.    Denies difficulty breathing or respiratory distress, chest pain, abdominal pain, vaginal discharge or bleeding, dysuria, and leg pain or swelling.   Breast mass found on last mammogram, awaiting biopsy results.    Gynecologic History  Patient's last menstrual period was 07/09/2014.  Contraception: tubal ligation  Last Pap: 05/2018. Results were: Negative/Negative  Last mammogram: 12/2019. Results were: abnormal, BI-RADS 4  Last colonoscopy: 2016/2017. Results were: abnormal, repeat in five (5) years  Obstetric History  Has three (3) children  Past Medical History:  Diagnosis Date  . Bipolar 1 disorder ([place])   . Depression   . Diabetes mellitus without complication ([place])   . Diverticulosis   . GERD (gastroesophageal reflux disease)   . Heart murmur    followed by PCP  . Hepatitis    B - "not active"  . Hypertension goal BP (blood pressure) < 130/80 01/17/2016  . Insomnia   . Irritable bowel syndrome   . Motion sickness    boats  . Obesity (BMI 30-39.9) 07/03/2015  . RA (rheumatoid arthritis) ([place])   . Sciatica     Past Surgical History:  Procedure Laterality Date  . BOWEL RESECTION    . BREAST BIOPSY Right 12/22/2019   affirm bx x marker, path pending  . BREAST BIOPSY    . CHOLECYSTECTOMY  1997   [place], [place]  . COLONOSCOPY WITH PROPOFOL N/A 09/07/2015   Procedure: COLONOSCOPY WITH PROPOFOL;  Surgeon: Lucilla Lame, MD;  Location: [place] [facility];  Service: Endoscopy;  Laterality: N/A;  . ESOPHAGOGASTRODUODENOSCOPY (EGD) WITH ESOPHAGEAL DILATION    . ESOPHAGOGASTRODUODENOSCOPY (EGD) WITH PROPOFOL N/A 09/07/2015   Procedure: ESOPHAGOGASTRODUODENOSCOPY (EGD) WITH  PROPOFOL;  Surgeon: Lucilla Lame, MD;  Location: [facility];  Service: Endoscopy;  Laterality: N/A;  . PARTIAL COLECTOMY N/A 01/24/2016   Procedure: SIGMOID COLECTOMY;  Surgeon: Florene Glen, MD;  Location: [facility];  Service: General;  Laterality: N/A;  . POLYPECTOMY N/A 09/07/2015   Procedure: POLYPECTOMY;  Surgeon: Lucilla Lame, MD;  Location: [place];  Service: Endoscopy;  Laterality: N/A;  . TUBAL LIGATION      Current Outpatient Medications on File Prior to Visit  Medication Sig Dispense Refill  . Accu-Chek FastClix Lancets MISC USE TO CHECK BLOOD SUGAR TWICE DAILY 100 each 5  . ACCU-CHEK SMARTVIEW test strip USE TO TEST ONCE DAILY 100 strip 0  . aspirin 81 MG tablet Take 81 mg by mouth daily.    Marland Kitchen atorvastatin (LIPITOR) 10 MG tablet Take 1 tablet (10 mg total) by mouth at bedtime. 90 tablet 3  . azelastine (ASTELIN) 0.1 % nasal spray Place 2 sprays into both nostrils 2 (two) times daily. Use in each nostril as directed 30 mL 12  . B-D INS SYR ULTRAFINE 1CC/31G 31G X 5/16" 1 ML MISC use as directed TO INJECT METHOTREXATE  1  . Blood Glucose Monitoring Suppl (ACCU-CHEK NANO SMARTVIEW) w/Device KIT Use as directed once daily. LON 99, E11.9 1 kit 0  . cholecalciferol (VITAMIN D) 1000 units tablet Take 1,000 Units by mouth daily.    . folic acid (FOLVITE) 1 MG tablet Take 1 mg by mouth daily.    Marland Kitchen  gabapentin (NEURONTIN) 300 MG capsule TAKE 1 CAPSULE BY MOUTH EVERY MORNING TAKE 1 CAPSULE AT NOON AND TAKE 2 CAPSULAS AT BEDTIME 360 capsule 0  . Lifitegrast (XIIDRA) 5 % SOLN 2 (two) times daily    . linaclotide (LINZESS) 290 MCG CAPS capsule TAKE 1 CAPSULE(290 MCG) BY MOUTH DAILY 90 capsule 1  . metFORMIN (GLUCOPHAGE-XR) 500 MG 24 hr tablet Take 1 tablet (500 mg total) by mouth 2 (two) times daily. 180 tablet 1  . methotrexate 50 MG/2ML injection Take 0.6 ml SQ once a week x 12 weeks 4 vials of 2 ml for 12 weeks    . Multiple Vitamins-Minerals (MULTIVITAMIN/EXTRA VITAMIN D3  PO) Take by mouth.    . triamcinolone ointment (KENALOG) 0.5 % Apply 1 application topically 2 (two) times daily. 120 g 1   No current facility-administered medications on file prior to visit.    Allergies  Allergen Reactions  . Ace Inhibitors Swelling    Angioedema (specifically from enalapril)  . Enalapril Swelling    angioedema  . Penicillins Hives and Swelling    Has patient had a PCN reaction causing immediate rash, facial/tongue/throat swelling, SOB or lightheadedness with hypotension: Yes Has patient had a PCN reaction causing severe rash involving mucus membranes or skin necrosis: No Has patient had a PCN reaction that required hospitalization Yes Has patient had a PCN reaction occurring within the last 10 years: No If all of the above answers are "NO", then may proceed with Cephalosporin use.  . Statins Hives    Social History   Socioeconomic History  . Marital status: Married    Spouse name: Mortimer Fries  . Number of children: 3  . Years of education: some college  . Highest education level: 12th grade  Occupational History  . Occupation: Disabled  Tobacco Use  . Smoking status: Former Smoker    Packs/day: 1.00    Years: 1.00    Pack years: 1.00    Types: Cigarettes    Quit date: 05/06/2015    Years since quitting: 4.8  . Smokeless tobacco: Never Used  . Tobacco comment: smoking cessation materials not required  Substance and Sexual Activity  . Alcohol use: No    Alcohol/week: 0.0 standard drinks    Comment: rarely small glass of wine  . Drug use: No  . Sexual activity: Yes    Birth control/protection: None  Other Topics Concern  . Not on file  Social History Narrative  . Not on file   Social Determinants of Health   Financial Resource Strain:   . Difficulty of Paying Living Expenses:   Food Insecurity:   . Worried About Charity fundraiser in the Last Year:   . Arboriculturist in the Last Year:   Transportation Needs:   . Film/video editor  (Medical):   Marland Kitchen Lack of Transportation (Non-Medical):   Physical Activity:   . Days of Exercise per Week:   . Minutes of Exercise per Session:   Stress: No Stress Concern Present  . Feeling of Stress : Only a little  Social Connections: Unknown  . Frequency of Communication with Friends and Family: More than three times a week  . Frequency of Social Gatherings with Friends and Family: Three times a week  . Attends Religious Services: More than 4 times per year  . Active Member of Clubs or Organizations: No  . Attends Archivist Meetings: Never  . Marital Status: Not on file  Intimate Partner Violence:   .  Fear of Current or Ex-Partner:   . Emotionally Abused:   Marland Kitchen Physically Abused:   . Sexually Abused:     Family History  Problem Relation Age of Onset  . Hypertension Mother   . Diabetes Mother   . Arthritis/Rheumatoid Mother   . Hyperlipidemia Mother   . Alcohol abuse Father   . Esophageal varices Father   . Diverticulosis Maternal Aunt   . Diverticulosis Maternal Grandmother   . Diabetes Maternal Grandmother   . Heart disease Maternal Grandmother   . Dementia Maternal Grandmother   . Diabetes Brother   . Kidney disease Brother   . Aneurysm Maternal Grandfather   . Diabetes Paternal Grandmother   . Healthy Brother   . Depression Daughter        bipolar disorder  . Depression Daughter        bipolar disorder  . Breast cancer Neg Hx     The following portions of the patient's history were reviewed and updated as appropriate: allergies, current medications, past family history, past medical history, past social history, past surgical history and problem list.  Review of Systems  ROS negative except as noted above. Information obtained from patient.   Objective:   BP 130/78   Pulse 93   Ht '5\' 6"'  (1.676 m)   Wt 238 lb 4.8 oz (108.1 kg)   LMP 07/09/2014   BMI 38.46 kg/m   CONSTITUTIONAL: Well-developed, well-nourished female in no acute distress.    SKIN: Skin is warm and dry. No rash noted. Not diaphoretic. No erythema. No pallor. Professional tattoos present.   [place]: Alert and oriented to person, place, and time.   PSYCHIATRIC: Normal mood and affect. Normal behavior. Normal judgment and thought content.  ABDOMEN: Soft, non distended; Non tender.  No Organomegaly.  PELVIC:  External Genitalia: Normal  Vagina: Atrophy present  Cervix: Normal  Uterus: Normal size, shape,consistency, mobile  Adnexa: Normal   MUSCULOSKELETAL: Normal range of motion. No tenderness.  No cyanosis, clubbing, or edema.  Assessment:   1. Vaginal dryness, menopausal  2. Dyspareunia, female  Plan:   Discussed treatment options in setting on undiagnosed breast mass.   Samples of uberlube provided.   Reviewed red flag symptoms and when to call.    Diona Fanti, CNM [facility], [facility]

## 2020-02-28 NOTE — Patient Instructions (Addendum)
Dyspareunia, Female Dyspareunia is pain that is associated with sexual activity. This can affect any part of the genitals or lower abdomen. There are many possible causes of this condition. In some cases, diagnosing the cause of dyspareunia can be difficult. This condition can be mild, moderate, or severe. Depending on the cause, dyspareunia may get better with treatment, but may return (recur) over time. What are the causes?  The cause of this condition is not always known. However, problems that affect the vulva, vagina, uterus, and other organs may cause dyspareunia. Common causes of this condition include:  Vaginal dryness.  Giving birth.  Infection.  Skin changes or conditions.  Side effects of medicines.  Endometriosis. This is when tissue that is like the lining of the uterus grows on the outside of the uterus.  Psychological conditions. These include depression, anxiety, or traumatic experiences.  Allergic reaction. What increases the risk? The following factors may make you more likely to develop this condition:  History of physical or sexual trauma.  Some medicines.  No longer having a monthly period (menopause).  Having recently given birth.  Taking baths using soaps that have perfumes. These can cause irritation.  Douching. What are the signs or symptoms? The main symptom of this condition is pain in any part of your genitals or lower abdomen during or after sex. This may include:  Irritation, burning, or stinging sensations in your vulva.  Discomfort when your vulva or surrounding area is touched.  Aching and throbbing pain that may be constant.  Pain that gets worse when something is inserted into your vagina. How is this diagnosed? This condition may be diagnosed based on:  Your symptoms, including where and when your pain occurs.  Your medical history.  A physical exam. A pelvic exam will most likely be done.  Tests that include ultrasound,  blood tests, and tests that check the body for infection.  Imaging tests, such as X-ray, MRI, and CT scan. You may be referred to a health care provider who specializes in women's health (gynecologist). How is this treated? Treatment depends on the cause of your condition and your symptoms. In most cases, you may need to stop sexual activity until your symptoms go away or get better. Treatment may include:  Lubricants, ointments, and creams.  Physical therapy.  Massage therapy.  Hormonal therapy.  Medicines to: ? Prevent or fight infection. ? Relieve pain. ? Help numb the area. ? Treat depression (antidepressants).  Counseling, which may include sex therapy.  Surgery. Follow these instructions at home: Lifestyle  Wear cotton underwear.  Use water-based lubricants as needed during sex. Avoid oil-based lubricants.  Do not use any products that can cause irritation. This may include certain condoms, spermicides, lubricants, soaps, tampons, vaginal sprays, or douches.  Always practice safe sex. Use a condom to prevent sexually transmitted infections (STIs).  Talk freely with your partner about your condition. General instructions  Take or apply over-the-counter and prescription medicines only as told by your health care provider.  Urinate before you have sex.  Consider joining a support group.  Get the results of any tests you have done. Ask your health care provider, or the department that is doing the procedure, when your results will be ready.  Keep all follow-up visits as told by your health care provider. This is important. Contact a health care provider if:  You have vaginal bleeding after having sex.  You develop a lump at the opening of your vagina even if the   lump is painless.  You have: ? Abnormal discharge from your vagina. ? Vaginal dryness. ? Itchiness or irritation of your vulva or vagina. ? A new rash. ? Symptoms that get worse or do not improve  with treatment. ? A fever. ? Pain when you urinate. ? Blood in your urine. Get help right away if:  You have severe pain in your abdomen during or shortly after sex.  You pass out after sex. Summary  Dyspareunia is pain that is associated with sexual activity. This can affect any part of the genitals or lower abdomen.  There are many causes of this condition. Treatment depends on the cause and your symptoms. In most cases, you may need to stop sexual activity until your symptoms improve.  Take or apply over-the-counter and prescription medicines only as told by your health care provider.  Contact a health care provider if your symptoms get worse or do not improve with treatment.  Keep all follow-up visits as told by your health care provider. This is important. This information is not intended to replace advice given to you by your health care provider. Make sure you discuss any questions you have with your health care provider. Document Revised: 02/01/2019 Document Reviewed: 02/01/2019 Elsevier Patient Education  [place].   Atrophic Vaginitis Atrophic vaginitis is a condition in which the tissues that line the vagina become dry and thin. This condition occurs in women who have stopped having their period. It is caused by a drop in a female hormone (estrogen). This hormone helps:  To keep the vagina moist.  To make a clear fluid. This clear fluid helps: ? To make the vagina ready for sex. ? To protect the vagina from infection. If the lining of the vagina is dry and thin, it may cause irritation, burning, or itchiness. It may also:  Make sex painful.  Make an exam of your vagina painful.  Cause bleeding.  Make you lose interest in sex.  Cause a burning feeling when you pee (urinate).  Cause a brown or yellow fluid to come from your vagina. Some women do not have symptoms. Follow these instructions at home: Medicines  Take over-the-counter and prescription  medicines only as told by your doctor.  Do not use herbs or other medicines unless your doctor says it is okay.  Use medicines for for dryness. These include: ? Oils to make the vagina soft. ? Creams. ? Moisturizers. General instructions  Do not douche.  Do not use products that can make your vagina dry. These include: ? Scented sprays. ? Scented tampons. ? Scented soaps.  Sex can help increase blood flow and soften the tissue in the vagina. If it hurts to have sex: ? Tell your partner. ? Use products to make sex more comfortable. Use these only as told by your doctor. Contact a doctor if you:  Have discharge from the vagina that is different than usual.  Have a bad smell coming from your vagina.  Have new symptoms.  Do not get better.  Get worse. Summary  Atrophic vaginitis is a condition in which the lining of the vagina becomes dry and thin.  This condition affects women who have stopped having their periods.  Treatment may include using products that help make the vagina soft.  Call a doctor if do not get better with treatment. This information is not intended to replace advice given to you by your health care provider. Make sure you discuss any questions you have with  your health care provider. Document Revised: 12/08/2017 Document Reviewed: 12/08/2017 Elsevier Patient Education  2020 [place] vaginal insert What is this medicine? PRASTERONE (PRAS ter one), also known as DEHYDROEPIANDROSTERONE (DHEA) is used to treat females who experience painful sexual intercourse, a symptom of menopause that occurs due to changes in and around the vagina. This medicine may be used for other purposes; ask your health care provider or pharmacist if you have questions. COMMON BRAND NAME(S): INTRAROSA What should I tell my health care provider before I take this medicine? They need to know if you have any of these conditions:  cancer, such as breast, uterine, or  other cancer  history of vaginal bleeding  an unusual or allergic reaction to prasterone, DHEA, other hormones, medicines, foods, dyes, or preservatives  pregnant or trying to get pregnant  breast-feeding How should I use this medicine? This medicine is for vaginal use only. Do not take by mouth. Follow the directions on the prescription label. Read package directions carefully before using. Wash hands before and after use. Use this medicine at bedtime. Do not use it more often than directed. Do not stop using except on your doctor's advice. Talk to your pediatrician regarding the use of this medicine in children. This medicine is not approved for use in children. Overdosage: If you think you have taken too much of this medicine contact a poison control center or emergency room at once. NOTE: This medicine is only for you. Do not share this medicine with others. What if I miss a dose? If you miss a dose, use it as soon as you can. If it is almost time for your next dose, use only that dose. Do not use double or extra doses. What may interact with this medicine? Interactions are not expected. Do not use any other vaginal products without telling your doctor or health care professional. This list may not describe all possible interactions. Give your health care provider a list of all the medicines, herbs, non-prescription drugs, or dietary supplements you use. Also tell them if you smoke, drink alcohol, or use illegal drugs. Some items may interact with your medicine. What should I watch for while using this medicine? Visit your doctor or health care professional for a regular check on your progress. This medicine may cause changes on a cervical Pap smear. You will receive regular pelvic exams. What side effects may I notice from receiving this medicine? Side effects that you should report to your doctor or health care professional as soon as possible:  allergic reactions like skin rash,  itching or hives Side effects that usually do not require medical attention (report to your doctor or health care professional if they continue or are bothersome):  vaginal discharge This list may not describe all possible side effects. Call your doctor for medical advice about side effects. You may report side effects to FDA at 1-800-FDA-1088. Where should I keep my medicine? Keep out of the reach of children. Store at room temperature or in a refrigerator between 5 and 30 degrees C (41 and 86 degrees F). Throw away any unused medicine after the expiration date. NOTE: This sheet is a summary. It may not cover all possible information. If you have questions about this medicine, talk to your doctor, pharmacist, or health care provider.  2020 Elsevier/Gold Standard (2016-05-21 12:30:18)

## 2020-03-01 ENCOUNTER — Encounter: Payer: Medicare Other | Admitting: Physical Therapy

## 2020-03-03 ENCOUNTER — Encounter: Payer: Medicare Other | Admitting: Physical Therapy

## 2020-03-08 ENCOUNTER — Encounter: Payer: Medicare Other | Admitting: Physical Therapy

## 2020-03-10 ENCOUNTER — Encounter: Payer: Medicare Other | Admitting: Physical Therapy

## 2020-03-10 ENCOUNTER — Other Ambulatory Visit: Payer: Self-pay

## 2020-03-10 MED ORDER — ACCU-CHEK SMARTVIEW VI STRP: ORAL_STRIP | 6 refills | Status: DC

## 2020-03-15 ENCOUNTER — Encounter: Payer: Medicare Other | Admitting: Physical Therapy

## 2020-03-17 ENCOUNTER — Encounter: Payer: Medicare Other | Admitting: Physical Therapy

## 2020-03-20 ENCOUNTER — Other Ambulatory Visit: Payer: Self-pay

## 2020-03-20 DIAGNOSIS — G8929 Other chronic pain: Secondary | ICD-10-CM

## 2020-03-20 MED ORDER — GABAPENTIN 300 MG PO CAPS: ORAL_CAPSULE | ORAL | 1 refills | Status: DC

## 2020-03-22 ENCOUNTER — Encounter: Payer: Medicare Other | Admitting: Physical Therapy

## 2020-03-24 ENCOUNTER — Encounter: Payer: Medicare Other | Admitting: Physical Therapy

## 2020-03-29 ENCOUNTER — Encounter: Payer: Medicare Other | Admitting: Physical Therapy

## 2020-03-31 ENCOUNTER — Ambulatory Visit (INDEPENDENT_AMBULATORY_CARE_PROVIDER_SITE_OTHER): Payer: Medicare Other

## 2020-03-31 ENCOUNTER — Encounter: Payer: Medicare Other | Admitting: Physical Therapy

## 2020-03-31 VITALS — Ht 66.0 in | Wt 238.0 lb

## 2020-03-31 DIAGNOSIS — Z1211 Encounter for screening for malignant neoplasm of colon: Secondary | ICD-10-CM | POA: Diagnosis not present

## 2020-03-31 DIAGNOSIS — Z0000 Encounter for general adult medical examination without abnormal findings: Secondary | ICD-10-CM

## 2020-03-31 NOTE — Patient Instructions (Signed)
Elizabeth [name] , Thank you for taking time to come for your Medicare Wellness Visit. I appreciate your ongoing commitment to your health goals. Please review the following plan we discussed and let me know if I can assist you in the future.   Screening recommendations/referrals: Colonoscopy: done 09/07/15. Referral sent to gastroenterology today. They will contact you with appt.  Mammogram: done 12/14/19 Bone Density: due age 54 Recommended yearly ophthalmology/optometry visit for glaucoma screening and checkup Recommended yearly dental visit for hygiene and checkup  Vaccinations: Influenza vaccine: done 08/25/19 Pneumococcal vaccine: done 11/04/17 Tdap vaccine: done 11/04/17 Shingles vaccine: done 01/08/18 & 04/01/18   Covid-19: done 02/10/20 & 03/04/20  Advanced directives: Please bring a copy of your health care power of attorney and living will to the office at your convenience.  Conditions/risks identified: Recommend healthy eating and physical activity for desired weight loss.   Next appointment: Please follow up in one year for your Medicare Annual Wellness visit.     Preventive Care 78 Years and Older, Female Preventive care refers to lifestyle choices and visits with your health care provider that can promote health and wellness. What does preventive care include?  A yearly physical exam. This is also called an annual well check.  Dental exams once or twice a year.  Routine eye exams. Ask your health care provider how often you should have your eyes checked.  Personal lifestyle choices, including:  Daily care of your teeth and gums.  Regular physical activity.  Eating a healthy diet.  Avoiding tobacco and drug use.  Limiting alcohol use.  Practicing safe sex.  Taking low-dose aspirin every day.  Taking vitamin and mineral supplements as recommended by your health care provider. What happens during an annual well check? The services and screenings done by your health  care provider during your annual well check will depend on your age, overall health, lifestyle risk factors, and family history of disease. Counseling  Your health care provider may ask you questions about your:  Alcohol use.  Tobacco use.  Drug use.  Emotional well-being.  Home and relationship well-being.  Sexual activity.  Eating habits.  History of falls.  Memory and ability to understand (cognition).  Work and work Statistician.  Reproductive health. Screening  You may have the following tests or measurements:  Height, weight, and BMI.  Blood pressure.  Lipid and cholesterol levels. These may be checked every 5 years, or more frequently if you are over 29 years old.  Skin check.  Lung cancer screening. You may have this screening every year starting at age 53 if you have a 30-pack-year history of smoking and currently smoke or have quit within the past 15 years.  Fecal occult blood test (FOBT) of the stool. You may have this test every year starting at age 93.  Flexible sigmoidoscopy or colonoscopy. You may have a sigmoidoscopy every 5 years or a colonoscopy every 10 years starting at age 25.  Hepatitis C blood test.  Hepatitis B blood test.  Sexually transmitted disease (STD) testing.  Diabetes screening. This is done by checking your blood sugar (glucose) after you have not eaten for a while (fasting). You may have this done every 1-3 years.  Bone density scan. This is done to screen for osteoporosis. You may have this done starting at age 33.  Mammogram. This may be done every 1-2 years. Talk to your health care provider about how often you should have regular mammograms. Talk with your health care provider  about your test results, treatment options, and if necessary, the need for more tests. Vaccines  Your health care provider may recommend certain vaccines, such as:  Influenza vaccine. This is recommended every year.  Tetanus, diphtheria, and  acellular pertussis (Tdap, Td) vaccine. You may need a Td booster every 10 years.  Zoster vaccine. You may need this after age 33.  Pneumococcal 13-valent conjugate (PCV13) vaccine. One dose is recommended after age 45.  Pneumococcal polysaccharide (PPSV23) vaccine. One dose is recommended after age 27. Talk to your health care provider about which screenings and vaccines you need and how often you need them. This information is not intended to replace advice given to you by your health care provider. Make sure you discuss any questions you have with your health care provider. Document Released: 12/22/2015 Document Revised: 08/14/2016 Document Reviewed: 09/26/2015 Elsevier Interactive Patient Education  2017 [place] Prevention in the Home Falls can cause injuries. They can happen to people of all ages. There are many things you can do to make your home safe and to help prevent falls. What can I do on the outside of my home?  Regularly fix the edges of walkways and driveways and fix any cracks.  Remove anything that might make you trip as you walk through a door, such as a raised step or threshold.  Trim any bushes or trees on the path to your home.  Use bright outdoor lighting.  Clear any walking paths of anything that might make someone trip, such as rocks or tools.  Regularly check to see if handrails are loose or broken. Make sure that both sides of any steps have handrails.  Any raised decks and porches should have guardrails on the edges.  Have any leaves, snow, or ice cleared regularly.  Use sand or salt on walking paths during winter.  Clean up any spills in your garage right away. This includes oil or grease spills. What can I do in the bathroom?  Use night lights.  Install grab bars by the toilet and in the tub and shower. Do not use towel bars as grab bars.  Use non-skid mats or decals in the tub or shower.  If you need to sit down in the shower, use  a plastic, non-slip stool.  Keep the floor dry. Clean up any water that spills on the floor as soon as it happens.  Remove soap buildup in the tub or shower regularly.  Attach bath mats securely with double-sided non-slip rug tape.  Do not have throw rugs and other things on the floor that can make you trip. What can I do in the bedroom?  Use night lights.  Make sure that you have a light by your bed that is easy to reach.  Do not use any sheets or blankets that are too big for your bed. They should not hang down onto the floor.  Have a firm chair that has side arms. You can use this for support while you get dressed.  Do not have throw rugs and other things on the floor that can make you trip. What can I do in the kitchen?  Clean up any spills right away.  Avoid walking on wet floors.  Keep items that you use a lot in easy-to-reach places.  If you need to reach something above you, use a strong step stool that has a grab bar.  Keep electrical cords out of the way.  Do not use floor polish or  wax that makes floors slippery. If you must use wax, use non-skid floor wax.  Do not have throw rugs and other things on the floor that can make you trip. What can I do with my stairs?  Do not leave any items on the stairs.  Make sure that there are handrails on both sides of the stairs and use them. Fix handrails that are broken or loose. Make sure that handrails are as long as the stairways.  Check any carpeting to make sure that it is firmly attached to the stairs. Fix any carpet that is loose or worn.  Avoid having throw rugs at the top or bottom of the stairs. If you do have throw rugs, attach them to the floor with carpet tape.  Make sure that you have a light switch at the top of the stairs and the bottom of the stairs. If you do not have them, ask someone to add them for you. What else can I do to help prevent falls?  Wear shoes that:  Do not have high heels.  Have  rubber bottoms.  Are comfortable and fit you well.  Are closed at the toe. Do not wear sandals.  If you use a stepladder:  Make sure that it is fully opened. Do not climb a closed stepladder.  Make sure that both sides of the stepladder are locked into place.  Ask someone to hold it for you, if possible.  Clearly mark and make sure that you can see:  Any grab bars or handrails.  First and last steps.  Where the edge of each step is.  Use tools that help you move around (mobility aids) if they are needed. These include:  Canes.  Walkers.  Scooters.  Crutches.  Turn on the lights when you go into a dark area. Replace any light bulbs as soon as they burn out.  Set up your furniture so you have a clear path. Avoid moving your furniture around.  If any of your floors are uneven, fix them.  If there are any pets around you, be aware of where they are.  Review your medicines with your doctor. Some medicines can make you feel dizzy. This can increase your chance of falling. Ask your doctor what other things that you can do to help prevent falls. This information is not intended to replace advice given to you by your health care provider. Make sure you discuss any questions you have with your health care provider. Document Released: 09/21/2009 Document Revised: 05/02/2016 Document Reviewed: 12/30/2014 Elsevier Interactive Patient Education  2017 [employer].

## 2020-03-31 NOTE — Progress Notes (Signed)
Subjective:   Elizabeth [name] is a 54 y.o. female who presents for an Initial Medicare Annual Wellness Visit.  Virtual Visit via Telephone Note  I connected with Elizabeth [name] on 03/31/20 at  9:20 AM EDT by telephone and verified that I am speaking with the correct person using two identifiers.  Medicare Annual Wellness visit completed telephonically due to Covid-19 pandemic.   Location: Patient: home Provider: office   I discussed the limitations, risks, security and privacy concerns of performing an evaluation and management service by telephone and the availability of in person appointments. The patient expressed understanding and agreed to proceed.  Some vital signs may be absent or patient reported.   Clemetine Marker, LPN    Review of Systems     Cardiac Risk Factors include: advanced age (>60mn, >>74women);diabetes mellitus;dyslipidemia;hypertension;obesity (BMI >30kg/m2)     Objective:    Today's Vitals   03/31/20 0936  Weight: 238 lb (108 kg)  Height: '5\' 6"'  (1.676 m)   Body mass index is 38.41 kg/m.  Advanced Directives 03/31/2020 04/01/2019 05/07/2018 03/27/2018 12/05/2017 08/26/2017 07/11/2017  Does Patient Have a Medical Advance Directive? Yes Yes No No No No No  Type of [place] Living will;Healthcare Power of Attorney - - - - -  Does patient want to make changes to medical advance directive? - - - Yes (MAU/Ambulatory/Procedural Areas - Information given) - - -  Copy of [place]? No - copy requested No - copy requested - - - - -  Would patient like information on creating a medical advance directive? - - No - Patient declined - No - Patient declined - -    Current Medications (verified) Outpatient Encounter Medications as of 03/31/2020  Medication Sig  . Accu-Chek FastClix Lancets MISC USE TO CHECK BLOOD SUGAR TWICE DAILY  . aspirin 81 MG tablet Take 81 mg by mouth daily.  .Marland Kitchen atorvastatin (LIPITOR) 10 MG tablet Take 1 tablet (10 mg total) by mouth at bedtime.  .Marland Kitchenazelastine (ASTELIN) 0.1 % nasal spray Place 2 sprays into both nostrils 2 (two) times daily. Use in each nostril as directed  . B-D INS SYR ULTRAFINE 1CC/31G 31G X 5/16" 1 ML MISC use as directed TO INJECT METHOTREXATE  . Blood Glucose Monitoring Suppl (ACCU-CHEK NANO SMARTVIEW) w/Device KIT Use as directed once daily. LON 99, E11.9  . cholecalciferol (VITAMIN D) 1000 units tablet Take 2,000 Units by mouth daily.   . clobetasol ointment (TEMOVATE) 0.05 % APPLY EXTERNALLY TO THE AFFECTED AREA TWICE DAILY  . folic acid (FOLVITE) 1 MG tablet Take 1 mg by mouth daily.  .Marland Kitchengabapentin (NEURONTIN) 300 MG capsule TAKE 1 CAPSULE BY MOUTH EVERY MORNING TAKE 1 CAPSULE AT NOON AND TAKE 2 CAPSULAS AT BEDTIME  . glucose blood (ACCU-CHEK SMARTVIEW) test strip USE TO TEST ONCE DAILY  . linaclotide (LINZESS) 290 MCG CAPS capsule TAKE 1 CAPSULE(290 MCG) BY MOUTH DAILY  . metFORMIN (GLUCOPHAGE-XR) 500 MG 24 hr tablet Take 1 tablet (500 mg total) by mouth 2 (two) times daily.  . Methotrexate Sodium (METHOTREXATE, PF,) 50 MG/2ML injection 15 mg once a week.  . Multiple Vitamins-Minerals (MULTIVITAMIN/EXTRA VITAMIN D3 PO) Take by mouth.  . OLANZapine (ZYPREXA) 15 MG tablet Take 15 mg by mouth at bedtime.  .Marland KitchenOVER THE COUNTER MEDICATION once daily Herbal Name: Goli  . triamcinolone ointment (KENALOG) 0.5 % Apply 1 application topically 2 (two) times daily.  . Turmeric  500 MG CAPS Take by mouth.  . [DISCONTINUED] Lifitegrast (XIIDRA) 5 % SOLN 2 (two) times daily  . [DISCONTINUED] methotrexate 50 MG/2ML injection Take 0.6 ml SQ once a week x 12 weeks 4 vials of 2 ml for 12 weeks   No facility-administered encounter medications on file as of 03/31/2020.    Allergies (verified) Ace inhibitors, Enalapril, Penicillins, and Statins   History: Past Medical History:  Diagnosis Date  . Bipolar 1 disorder ([place])   . Depression   .  Diabetes mellitus without complication ([place])   . Diverticulosis   . GERD (gastroesophageal reflux disease)   . Heart murmur    followed by PCP  . Hepatitis    B - "not active"  . Hypertension goal BP (blood pressure) < 130/80 01/17/2016  . Insomnia   . Irritable bowel syndrome   . Motion sickness    boats  . Obesity (BMI 30-39.9) 07/03/2015  . RA (rheumatoid arthritis) ([place])   . Sciatica    Past Surgical History:  Procedure Laterality Date  . BOWEL RESECTION    . BREAST BIOPSY Right 12/22/2019   affirm bx x marker, path pending  . BREAST BIOPSY    . CHOLECYSTECTOMY  1997   [place]  . COLONOSCOPY WITH PROPOFOL N/A 09/07/2015   Procedure: COLONOSCOPY WITH PROPOFOL;  Surgeon: Lucilla Lame, MD;  Location: [place];  Service: Endoscopy;  Laterality: N/A;  . ESOPHAGOGASTRODUODENOSCOPY (EGD) WITH ESOPHAGEAL DILATION    . ESOPHAGOGASTRODUODENOSCOPY (EGD) WITH PROPOFOL N/A 09/07/2015   Procedure: ESOPHAGOGASTRODUODENOSCOPY (EGD) WITH PROPOFOL;  Surgeon: Lucilla Lame, MD;  Location: [place];  Service: Endoscopy;  Laterality: N/A;  . PARTIAL COLECTOMY N/A 01/24/2016   Procedure: SIGMOID COLECTOMY;  Surgeon: Florene Glen, MD;  Location: [facility];  Service: General;  Laterality: N/A;  . POLYPECTOMY N/A 09/07/2015   Procedure: POLYPECTOMY;  Surgeon: Lucilla Lame, MD;  Location: [place];  Service: Endoscopy;  Laterality: N/A;  . TUBAL LIGATION     Family History  Problem Relation Age of Onset  . Hypertension Mother   . Diabetes Mother   . Arthritis/Rheumatoid Mother   . Hyperlipidemia Mother   . Alcohol abuse Father   . Esophageal varices Father   . Diverticulosis Maternal Aunt   . Diverticulosis Maternal Grandmother   . Diabetes Maternal Grandmother   . Heart disease Maternal Grandmother   . Dementia Maternal Grandmother   . Diabetes Brother   . Kidney disease Brother   . Aneurysm Maternal Grandfather   . Diabetes Paternal Grandmother   .  Healthy Brother   . Depression Daughter        bipolar disorder  . Depression Daughter        bipolar disorder  . Breast cancer Neg Hx    Social History   Socioeconomic History  . Marital status: Married    Spouse name: Mortimer Fries  . Number of children: 3  . Years of education: some college  . Highest education level: 12th grade  Occupational History  . Occupation: Disabled  Tobacco Use  . Smoking status: Former Smoker    Packs/day: 1.00    Years: 1.00    Pack years: 1.00    Types: Cigarettes    Quit date: 05/06/2015    Years since quitting: 4.9  . Smokeless tobacco: Never Used  . Tobacco comment: smoking cessation materials not required  Substance and Sexual Activity  . Alcohol use: No    Alcohol/week: 0.0 standard drinks  Comment: rarely small glass of wine  . Drug use: No  . Sexual activity: Yes    Birth control/protection: None  Other Topics Concern  . Not on file  Social History Narrative  . Not on file   Social Determinants of Health   Financial Resource Strain: Low Risk   . Difficulty of Paying Living Expenses: Not hard at all  Food Insecurity: No Food Insecurity  . Worried About Charity fundraiser in the Last Year: Never true  . Ran Out of Food in the Last Year: Never true  Transportation Needs: No Transportation Needs  . Lack of Transportation (Medical): No  . Lack of Transportation (Non-Medical): No  Physical Activity: Sufficiently Active  . Days of Exercise per Week: 7 days  . Minutes of Exercise per Session: 50 min  Stress: No Stress Concern Present  . Feeling of Stress : Not at all  Social Connections: Slightly Isolated  . Frequency of Communication with Friends and Family: More than three times a week  . Frequency of Social Gatherings with Friends and Family: Three times a week  . Attends Religious Services: More than 4 times per year  . Active Member of Clubs or Organizations: No  . Attends Archivist Meetings: Never  . Marital Status:  Married    Tobacco Counseling Counseling given: Not Answered Comment: smoking cessation materials not required   Clinical Intake:  Pre-visit preparation completed: Yes  Pain : No/denies pain     BMI - recorded: 38.41 Nutritional Status: BMI > 30  Obese Nutritional Risks: None Diabetes: Yes CBG done?: No Did pt. bring in CBG monitor from home?: No   Nutrition Risk Assessment:  Has the patient had any N/V/D within the last 2 months?  No  Does the patient have any non-healing wounds?  No  Has the patient had any unintentional weight loss or weight gain?  No   Diabetes:  Is the patient diabetic?  Yes  If diabetic, was a CBG obtained today?  No  Did the patient bring in their glucometer from home?  No  How often do you monitor your CBG's? Every other day.   Financial Strains and Diabetes Management:  Are you having any financial strains with the device, your supplies or your medication? No .  Does the patient want to be seen by Chronic Care Management for management of their diabetes?  No  Would the patient like to be referred to a Nutritionist or for Diabetic Management?  No   Diabetic Exams:  Diabetic Eye Exam: Completed 06/24/19 negative retinopathy.   Diabetic Foot Exam: Completed 12/31/18. Pt has been advised about the importance in completing this exam. Pt is scheduled for diabetic foot exam on 04/18/20.   How often do you need to have someone help you when you read instructions, pamphlets, or other written materials from your doctor or pharmacy?: 1 - Never  Interpreter Needed?: No  Information entered by :: Clemetine Marker LPN   Activities of Daily Living In your present state of health, do you have any difficulty performing the following activities: 03/31/2020 01/20/2020  Hearing? N N  Comment declines hearing aids -  Vision? N N  Comment - -  Difficulty concentrating or making decisions? N Y  Walking or climbing stairs? N N  Dressing or bathing? N N  Doing  errands, shopping? N N  Preparing Food and eating ? N -  Using the Toilet? N -  In the past six months, have  you accidently leaked urine? N -  Do you have problems with loss of bowel control? N -  Managing your Medications? N -  Managing your Finances? N -  Housekeeping or managing your Housekeeping? N -  Some recent data might be hidden     Immunizations and Health Maintenance Immunization History  Administered Date(s) Administered  . Hepatitis A, Adult 11/12/2017, 05/12/2018  . Influenza,inj,Quad PF,6+ Mos 08/06/2017, 07/14/2018, 08/10/2019  . Influenza-Unspecified 08/06/2017  . Moderna SARS-COVID-2 Vaccination 02/10/2020, 03/04/2020  . Pneumococcal Polysaccharide-23 11/04/2017  . Td 11/04/2017  . Tdap 11/29/2015, 11/04/2017  . Zoster Recombinat (Shingrix) 01/08/2018, 04/01/2018   Health Maintenance Due  Topic Date Due  . FOOT EXAM  01/01/2020    Patient Care Team: Towanda Malkin, MD as PCP - General (Internal Medicine) Marlowe Sax, MD as Consulting Physician (Internal Medicine)  Indicate any recent Medical Services you may have received from other than [facility] providers in the past year (date may be approximate).     Assessment:   This is a routine wellness examination for Elizabeth [name].  Hearing/Vision screen  Hearing Screening   '125Hz'  '250Hz'  '500Hz'  '1000Hz'  '2000Hz'  '3000Hz'  '4000Hz'  '6000Hz'  '8000Hz'   Right ear:           Left ear:           Comments: Pt denies hearing difficulty  Vision Screening Comments:  [facility] screen[facility]  Dietary issues and exercise activities discussed: Current Exercise Habits: Home exercise routine, Type of exercise: strength training/weights;calisthenics;Other - see comments(chair exercise), Time (Minutes): 45, Frequency (Times/Week): 7, Weekly Exercise (Minutes/Week): 315, Intensity: Moderate, Exercise limited by: orthopedic condition(s)  Goals    . DIET - reduce carb intake     Recommend to decrease  portion sizes by eating 3 small healthy meals and at least 2 healthy snacks per day.    . Weight (lb) < 200 lb (90.7 kg)     Pt states she would like to lose weight and get health back in order over the next year. Has started a plant based diet and daily exercise.       Depression Screen PHQ 2/9 Scores 03/31/2020 01/20/2020 01/07/2020 10/18/2019 07/01/2019 05/17/2019 04/01/2019  PHQ - 2 Score 0 0 0 0 1 0 2  PHQ- 9 Score - 8 0 0 1 0 8    Fall Risk Fall Risk  03/31/2020 01/20/2020 01/07/2020 12/28/2019 10/18/2019  Falls in the past year? 0 0 0 0 0  Comment - - - - -  Number falls in past yr: 0 0 0 - 0  Injury with Fall? 0 0 0 - 0  Comment - - - - -  Risk for fall due to : No Fall Risks - - - -  Risk for fall due to: Comment - - - - -  Follow up Falls prevention discussed - Falls evaluation completed - -    FALL RISK PREVENTION PERTAINING TO THE HOME:  Any stairs in or around the home? No  If so, do they handrails? No   Home free of loose throw rugs in walkways, pet beds, electrical cords, etc? Yes  Adequate lighting in your home to reduce risk of falls? Yes   ASSISTIVE DEVICES UTILIZED TO PREVENT FALLS:  Life alert? No  Use of a cane, walker or w/c? No  Grab bars in the bathroom? Yes Shower chair or bench in shower? No  Elevated toilet seat or a handicapped toilet? No   DME ORDERS:  DME  order needed?  No   TIMED UP AND GO:  Was the test performed? No . Telephonic visit.   Education: Fall risk prevention has been discussed.  Intervention(s) required? No   Cognitive Function: 6CIT deferred for 2021 AWV. Pt has no memory issues.      6CIT Screen 04/01/2019 03/27/2018  What Year? 0 points 0 points  What month? 0 points 0 points  What time? 0 points 0 points  Count back from 20 0 points 0 points  Months in reverse 0 points 0 points  Repeat phrase 0 points 0 points  Total Score 0 0    Screening Tests Health Maintenance  Topic Date Due  . FOOT EXAM  01/01/2020  .  OPHTHALMOLOGY EXAM  06/23/2020  . URINE MICROALBUMIN  06/30/2020  . INFLUENZA VACCINE  07/09/2020  . HEMOGLOBIN A1C  07/19/2020  . COLONOSCOPY  09/06/2020  . MAMMOGRAM  12/13/2020  . PAP SMEAR-Modifier  05/26/2021  . TETANUS/TDAP  11/05/2027  . PNEUMOCOCCAL POLYSACCHARIDE VACCINE AGE 41-64 HIGH RISK  Completed  . COVID-19 Vaccine  Completed  . HIV Screening  Completed    Qualifies for Shingles Vaccine? Shingrix series completed.   Tdap: Up to date  Flu Vaccine: Up to date  Pneumococcal Vaccine: Up to date   Cancer Screenings:  Colorectal Screening: Completed 09/07/15. Repeat every 5 years. Referral to GI placed today. Pt aware the office will call re: appt.  Mammogram: Completed 12/14/19. Repeat every year;   Bone Density: due age 58  Lung Cancer Screening: (Low Dose CT Chest recommended if Age 18-80 years, 30 pack-year currently smoking OR have quit w/in 15years.) does not qualify.   Additional Screening:  Hepatitis C Screening: does not qualify  Vision Screening: Recommended annual ophthalmology exams for early detection of glaucoma and other disorders of the eye. Is the patient up to date with their annual eye exam?  Yes  Who is the provider or what is the name of the office in which the pt attends annual eye exams? [place] Screening: Recommended annual dental exams for proper oral hygiene  Community Resource Referral:  CRR required this visit?  No      Plan:    I have personally reviewed and addressed the Medicare Annual Wellness questionnaire and have noted the following in the patient's chart:  A. Medical and social history B. Use of alcohol, tobacco or illicit drugs  C. Current medications and supplements D. Functional ability and status E.  Nutritional status F.  Physical activity G. Advance directives H. List of other physicians I.  Hospitalizations, surgeries, and ER visits in previous 12 months J.  [place] such as  hearing and vision if needed, cognitive and depression L. Referrals and appointments   In addition, I have reviewed and discussed with patient certain preventive protocols, quality metrics, and best practice recommendations. A written personalized care plan for preventive services as well as general preventive health recommendations were provided to patient.   Signed,  Clemetine Marker, LPN Nurse Health Advisor   Nurse Notes: none

## 2020-04-05 ENCOUNTER — Encounter: Payer: Medicare Other | Admitting: Physical Therapy

## 2020-04-05 ENCOUNTER — Telehealth (INDEPENDENT_AMBULATORY_CARE_PROVIDER_SITE_OTHER): Payer: Self-pay | Admitting: Gastroenterology

## 2020-04-05 ENCOUNTER — Other Ambulatory Visit: Payer: Self-pay

## 2020-04-05 DIAGNOSIS — Z86010 Personal history of colonic polyps: Secondary | ICD-10-CM

## 2020-04-05 NOTE — Progress Notes (Signed)
Gastroenterology Pre-Procedure Review  Request Date: 05/16/20 Requesting Physician: Dr. Allen Norris  PATIENT REVIEW QUESTIONS: The patient responded to the following health history questions as indicated:    1. Are you having any GI issues? no 2. Do you have a personal history of Polyps? yes (2016 with Dr. Allen Norris) 3. Do you have a family history of Colon Cancer or Polyps? no 4. Diabetes Mellitus? yes (yes oral meds and insulin) 5. Joint replacements in the past 12 months?no 6. Major health problems in the past 3 months?no 7. Any artificial heart valves, MVP, or defibrillator?no    MEDICATIONS & ALLERGIES:    Patient reports the following regarding taking any anticoagulation/antiplatelet therapy:   Plavix, Coumadin, Eliquis, Xarelto, Lovenox, Pradaxa, Brilinta, or Effient? no Aspirin? yes (81 mg daily)  Patient confirms/reports the following medications:  Current Outpatient Medications  Medication Sig Dispense Refill  . Accu-Chek FastClix Lancets MISC USE TO CHECK BLOOD SUGAR TWICE DAILY 100 each 5  . aspirin 81 MG tablet Take 81 mg by mouth daily.    Marland Kitchen atorvastatin (LIPITOR) 10 MG tablet Take 1 tablet (10 mg total) by mouth at bedtime. 90 tablet 3  . azelastine (ASTELIN) 0.1 % nasal spray Place 2 sprays into both nostrils 2 (two) times daily. Use in each nostril as directed 30 mL 12  . B-D INS SYR ULTRAFINE 1CC/31G 31G X 5/16" 1 ML MISC use as directed TO INJECT METHOTREXATE  1  . Blood Glucose Monitoring Suppl (ACCU-CHEK NANO SMARTVIEW) w/Device KIT Use as directed once daily. LON 99, E11.9 1 kit 0  . cholecalciferol (VITAMIN D) 1000 units tablet Take 2,000 Units by mouth daily.     . clobetasol ointment (TEMOVATE) 0.05 % APPLY EXTERNALLY TO THE AFFECTED AREA TWICE DAILY    . folic acid (FOLVITE) 1 MG tablet Take 1 mg by mouth daily.    Marland Kitchen gabapentin (NEURONTIN) 300 MG capsule TAKE 1 CAPSULE BY MOUTH EVERY MORNING TAKE 1 CAPSULE AT NOON AND TAKE 2 CAPSULAS AT BEDTIME 360 capsule 1  . glucose  blood (ACCU-CHEK SMARTVIEW) test strip USE TO TEST ONCE DAILY 100 strip 6  . linaclotide (LINZESS) 290 MCG CAPS capsule TAKE 1 CAPSULE(290 MCG) BY MOUTH DAILY 90 capsule 1  . metFORMIN (GLUCOPHAGE-XR) 500 MG 24 hr tablet Take 1 tablet (500 mg total) by mouth 2 (two) times daily. 180 tablet 1  . Methotrexate Sodium (METHOTREXATE, PF,) 50 MG/2ML injection 15 mg once a week.    . Multiple Vitamins-Minerals (MULTIVITAMIN/EXTRA VITAMIN D3 PO) Take by mouth.    . OLANZapine (ZYPREXA) 15 MG tablet Take 15 mg by mouth at bedtime.    . Omega-3 Fatty Acids (FISH OIL PO) Take by mouth.    Marland Kitchen OVER THE COUNTER MEDICATION once daily Herbal Name: Goli    . triamcinolone ointment (KENALOG) 0.5 % Apply 1 application topically 2 (two) times daily. 120 g 1  . Turmeric 500 MG CAPS Take by mouth.     No current facility-administered medications for this visit.    Patient confirms/reports the following allergies:  Allergies  Allergen Reactions  . Ace Inhibitors Swelling    Angioedema (specifically from enalapril)  . Enalapril Swelling    angioedema  . Penicillins Hives and Swelling    Has patient had a PCN reaction causing immediate rash, facial/tongue/throat swelling, SOB or lightheadedness with hypotension: Yes Has patient had a PCN reaction causing severe rash involving mucus membranes or skin necrosis: No Has patient had a PCN reaction that required hospitalization Yes Has  patient had a PCN reaction occurring within the last 10 years: No If all of the above answers are "NO", then may proceed with Cephalosporin use.  . Statins Hives    Orders Placed This Encounter  Procedures  . Procedural/ Surgical Case Request: COLONOSCOPY WITH PROPOFOL    Standing Status:   Standing    Number of Occurrences:   1    Order Specific Question:   Pre-op diagnosis    Answer:   personal history of colon polyps    Order Specific Question:   CPT Code    Answer:   70230    AUTHORIZATION INFORMATION Primary  Insurance: 1D#: Group #:  Secondary Insurance: 1D#: Group #:  SCHEDULE INFORMATION: Date: Tuesday 05/16/20 Time: Location:[facility]

## 2020-04-07 ENCOUNTER — Encounter: Payer: Medicare Other | Admitting: Physical Therapy
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
## 2020-04-18 ENCOUNTER — Ambulatory Visit (INDEPENDENT_AMBULATORY_CARE_PROVIDER_SITE_OTHER): Payer: Medicare Other | Admitting: Internal Medicine

## 2020-04-18 ENCOUNTER — Encounter: Payer: Self-pay | Admitting: Internal Medicine

## 2020-04-18 ENCOUNTER — Other Ambulatory Visit: Payer: Self-pay

## 2020-04-18 VITALS — BP 128/84 | HR 88 | Temp 97.8°F | Resp 16 | Ht 66.0 in | Wt 236.8 lb

## 2020-04-18 DIAGNOSIS — M05742 Rheumatoid arthritis with rheumatoid factor of left hand without organ or systems involvement: Secondary | ICD-10-CM

## 2020-04-18 DIAGNOSIS — I10 Essential (primary) hypertension: Secondary | ICD-10-CM | POA: Diagnosis not present

## 2020-04-18 DIAGNOSIS — M5441 Lumbago with sciatica, right side: Secondary | ICD-10-CM

## 2020-04-18 DIAGNOSIS — M05741 Rheumatoid arthritis with rheumatoid factor of right hand without organ or systems involvement: Secondary | ICD-10-CM

## 2020-04-18 DIAGNOSIS — E785 Hyperlipidemia, unspecified: Secondary | ICD-10-CM | POA: Diagnosis not present

## 2020-04-18 DIAGNOSIS — G8929 Other chronic pain: Secondary | ICD-10-CM

## 2020-04-18 DIAGNOSIS — F3132 Bipolar disorder, current episode depressed, moderate: Secondary | ICD-10-CM

## 2020-04-18 DIAGNOSIS — E1165 Type 2 diabetes mellitus with hyperglycemia: Secondary | ICD-10-CM | POA: Diagnosis not present

## 2020-04-18 DIAGNOSIS — D241 Benign neoplasm of right breast: Secondary | ICD-10-CM

## 2020-04-18 DIAGNOSIS — M5442 Lumbago with sciatica, left side: Secondary | ICD-10-CM

## 2020-04-18 DIAGNOSIS — J310 Chronic rhinitis: Secondary | ICD-10-CM

## 2020-04-18 LAB — POCT GLYCOSYLATED HEMOGLOBIN (HGB A1C): Hemoglobin A1C: 7 % — AB (ref 4.0–5.6)

## 2020-04-18 NOTE — Addendum Note (Signed)
Addended by: Lennie Muckle on: 04/18/2020 08:37 AM   Modules accepted: Orders

## 2020-05-11 ENCOUNTER — Other Ambulatory Visit: Payer: Self-pay

## 2020-05-11 DIAGNOSIS — D241 Benign neoplasm of right breast: Secondary | ICD-10-CM

## 2020-05-12 ENCOUNTER — Other Ambulatory Visit: Payer: Self-pay

## 2020-05-12 ENCOUNTER — Other Ambulatory Visit
Admission: RE | Admit: 2020-05-12 | Discharge: 2020-05-12 | Disposition: A | Discharge status: Home / Self Care | Payer: Medicare Other | Source: Ambulatory Visit | Attending: Gastroenterology | Admitting: Gastroenterology

## 2020-05-12 DIAGNOSIS — Z20822 Contact with and (suspected) exposure to covid-19: Secondary | ICD-10-CM | POA: Insufficient documentation

## 2020-05-12 DIAGNOSIS — Z01812 Encounter for preprocedural laboratory examination: Secondary | ICD-10-CM | POA: Diagnosis present

## 2020-05-12 LAB — SARS CORONAVIRUS 2 (TAT 6-24 HRS): SARS Coronavirus 2: NEGATIVE

## 2020-05-16 ENCOUNTER — Ambulatory Visit
Admission: RE | Admit: 2020-05-16 | Discharge: 2020-05-16 | Disposition: A | Discharge status: Home / Self Care | Payer: Medicare Other | Attending: Gastroenterology | Admitting: Gastroenterology

## 2020-05-16 ENCOUNTER — Encounter
Admission: RE | Disposition: A | Discharge status: Home / Self Care | Payer: Self-pay | Source: Home / Self Care | Attending: Gastroenterology

## 2020-05-16 ENCOUNTER — Ambulatory Visit: Payer: Medicare Other | Admitting: Certified Registered Nurse Anesthetist

## 2020-05-16 ENCOUNTER — Other Ambulatory Visit: Payer: Self-pay

## 2020-05-16 ENCOUNTER — Encounter: Payer: Self-pay | Admitting: Gastroenterology

## 2020-05-16 DIAGNOSIS — K219 Gastro-esophageal reflux disease without esophagitis: Secondary | ICD-10-CM | POA: Diagnosis not present

## 2020-05-16 DIAGNOSIS — E119 Type 2 diabetes mellitus without complications: Secondary | ICD-10-CM | POA: Diagnosis not present

## 2020-05-16 DIAGNOSIS — M069 Rheumatoid arthritis, unspecified: Secondary | ICD-10-CM | POA: Diagnosis not present

## 2020-05-16 DIAGNOSIS — K5730 Diverticulosis of large intestine without perforation or abscess without bleeding: Secondary | ICD-10-CM | POA: Insufficient documentation

## 2020-05-16 DIAGNOSIS — F319 Bipolar disorder, unspecified: Secondary | ICD-10-CM | POA: Diagnosis not present

## 2020-05-16 DIAGNOSIS — I10 Essential (primary) hypertension: Secondary | ICD-10-CM | POA: Insufficient documentation

## 2020-05-16 DIAGNOSIS — Z7984 Long term (current) use of oral hypoglycemic drugs: Secondary | ICD-10-CM | POA: Diagnosis not present

## 2020-05-16 DIAGNOSIS — Z79899 Other long term (current) drug therapy: Secondary | ICD-10-CM | POA: Diagnosis not present

## 2020-05-16 DIAGNOSIS — K648 Other hemorrhoids: Secondary | ICD-10-CM | POA: Insufficient documentation

## 2020-05-16 DIAGNOSIS — Z7982 Long term (current) use of aspirin: Secondary | ICD-10-CM | POA: Diagnosis not present

## 2020-05-16 DIAGNOSIS — Z6838 Body mass index (BMI) 38.0-38.9, adult: Secondary | ICD-10-CM | POA: Insufficient documentation

## 2020-05-16 DIAGNOSIS — Z860100 Personal history of colon polyps, unspecified: Secondary | ICD-10-CM

## 2020-05-16 DIAGNOSIS — Z87891 Personal history of nicotine dependence: Secondary | ICD-10-CM | POA: Insufficient documentation

## 2020-05-16 DIAGNOSIS — Z1211 Encounter for screening for malignant neoplasm of colon: Secondary | ICD-10-CM | POA: Insufficient documentation

## 2020-05-16 DIAGNOSIS — K635 Polyp of colon: Secondary | ICD-10-CM

## 2020-05-16 HISTORY — PX: COLONOSCOPY WITH PROPOFOL: SHX5780

## 2020-05-16 LAB — GLUCOSE, CAPILLARY: Glucose-Capillary: 133 mg/dL — ABNORMAL HIGH (ref 70–99)

## 2020-05-16 SURGERY — COLONOSCOPY WITH PROPOFOL
Anesthesia: General

## 2020-05-16 MED ORDER — LIDOCAINE HCL (CARDIAC) PF 100 MG/5ML IV SOSY
PREFILLED_SYRINGE | INTRAVENOUS | Status: DC | PRN
  Administered 2020-05-16: 50 mg via INTRAVENOUS

## 2020-05-16 MED ORDER — PROPOFOL 10 MG/ML IV BOLUS
INTRAVENOUS | Status: DC | PRN
  Administered 2020-05-16: 60 mg via INTRAVENOUS
  Administered 2020-05-16: 40 mg via INTRAVENOUS

## 2020-05-16 MED ORDER — PROPOFOL 500 MG/50ML IV EMUL
INTRAVENOUS | Status: AC
  Filled 2020-05-16: qty 50

## 2020-05-16 MED ORDER — PROPOFOL 500 MG/50ML IV EMUL
INTRAVENOUS | Status: DC | PRN
  Administered 2020-05-16: 150 ug/kg/min via INTRAVENOUS

## 2020-05-16 MED ORDER — SODIUM CHLORIDE 0.9 % IV SOLN: INTRAVENOUS | Status: DC

## 2020-05-16 NOTE — Transfer of Care (Signed)
Immediate Anesthesia Transfer of Care Note  Patient: Elizabeth [name]  Procedure(s) Performed: COLONOSCOPY WITH PROPOFOL (N/A )  Patient Location: PACU  Anesthesia Type:General  Level of Consciousness: awake, alert  and oriented  Airway & Oxygen Therapy: Patient Spontanous Breathing  Post-op Assessment: Report given to RN and Post -op Vital signs reviewed and stable  Post vital signs: Reviewed and stable  Last Vitals:  Vitals Value Taken Time  BP 125/81 05/16/20 0852  Temp    Pulse 68 05/16/20 0852  Resp 14 05/16/20 0852  SpO2 100 % 05/16/20 0852    Last Pain:  Vitals:   05/16/20 0800  TempSrc: Temporal  PainSc: 0-No pain         Complications: No apparent anesthesia complications

## 2020-05-16 NOTE — Anesthesia Postprocedure Evaluation (Signed)
Anesthesia Post Note  Patient: Elizabeth [name]  Procedure(s) Performed: COLONOSCOPY WITH PROPOFOL (N/A )  Patient location during evaluation: Endoscopy Anesthesia Type: General Level of consciousness: awake and alert Pain management: pain level controlled Vital Signs Assessment: post-procedure vital signs reviewed and stable Respiratory status: spontaneous breathing, nonlabored ventilation, respiratory function stable and patient connected to nasal cannula oxygen Cardiovascular status: blood pressure returned to baseline and stable Postop Assessment: no apparent nausea or vomiting Anesthetic complications: no     Last Vitals:  Vitals:   05/16/20 0852 05/16/20 0911  BP: 125/81 134/84  Pulse: 68   Resp: 14   Temp:    SpO2: 100%     Last Pain:  Vitals:   05/16/20 0911  TempSrc:   PainSc: 0-No pain                 Martha Clan

## 2020-05-16 NOTE — Anesthesia Preprocedure Evaluation (Signed)
Anesthesia Evaluation  Patient identified by MRN, date of birth, ID band Patient awake    Reviewed: Allergy & Precautions, H&P , NPO status , Patient's Chart, lab work & pertinent test results, reviewed documented beta blocker date and time   History of Anesthesia Complications Negative for: history of anesthetic complications  Airway Mallampati: II  TM Distance: >3 FB Neck ROM: full    Dental no notable dental hx. (+) Chipped, Missing, Poor Dentition   Pulmonary neg shortness of breath, neg sleep apnea, neg COPD, neg recent URI, former smoker,    Pulmonary exam normal breath sounds clear to auscultation       Cardiovascular Exercise Tolerance: Good hypertension, (-) angina(-) CAD, (-) Past MI, (-) Cardiac Stents and (-) CABG Normal cardiovascular exam(-) dysrhythmias + Valvular Problems/Murmurs  Rhythm:regular Rate:Normal     Neuro/Psych neg Seizures PSYCHIATRIC DISORDERS Depression Bipolar Disorder  Neuromuscular disease    GI/Hepatic GERD  ,(+) Hepatitis - (resolved), B  Endo/Other  diabetesMorbid obesity  Renal/GU negative Renal ROS  negative genitourinary   Musculoskeletal   Abdominal   Peds  Hematology negative hematology ROS (+)   Anesthesia Other Findings Past Medical History:   Irritable bowel syndrome                                     RA (rheumatoid arthritis) (HCC)                              Bipolar 1 disorder (HCC)                                     Insomnia                                                     GERD (gastroesophageal reflux disease)                       Diverticulosis                                               Motion sickness                                                Comment:boats   Hepatitis                                                      Comment:B - "not active"   Heart murmur  Comment:followed by PCP   Diabetes  mellitus without complication ([place])                 Depression                                                   Reproductive/Obstetrics negative OB ROS                             Anesthesia Physical  Anesthesia Plan  ASA: III  Anesthesia Plan: General   Post-op Pain Management:    Induction: Intravenous  PONV Risk Score and Plan: 2 and Propofol infusion and TIVA  Airway Management Planned: Natural Airway and Nasal Cannula  Additional Equipment:   Intra-op Plan:   Post-operative Plan:   Informed Consent: I have reviewed the patients History and Physical, chart, labs and discussed the procedure including the risks, benefits and alternatives for the proposed anesthesia with the patient or authorized representative who has indicated his/her understanding and acceptance.     Dental Advisory Given  Plan Discussed with: Anesthesiologist, CRNA and Surgeon  Anesthesia Plan Comments:         Anesthesia Quick Evaluation

## 2020-05-16 NOTE — Op Note (Signed)
[facility] Gastroenterology Patient Name: Elizabeth [name] Procedure Date: 05/16/2020 8:18 AM MRN: [id number] Account #: [id number] Date of Birth: 04-May-1966 Admit Type: Outpatient Age: 54 Room: [facility] ENDO ROOM 4 Gender: Female Note Status: Finalized Procedure:             Colonoscopy Indications:           High risk colon cancer surveillance: Personal history                         of colonic polyps Providers:             Lucilla Lame MD, MD Medicines:             Propofol per Anesthesia Complications:         No immediate complications. Procedure:             Pre-Anesthesia Assessment:                        - Prior to the procedure, a History and Physical was                         performed, and patient medications and allergies were                         reviewed. The patient's tolerance of previous                         anesthesia was also reviewed. The risks and benefits                         of the procedure and the sedation options and risks                         were discussed with the patient. All questions were                         answered, and informed consent was obtained. Prior                         Anticoagulants: The patient has taken no previous                         anticoagulant or antiplatelet agents. ASA Grade                         Assessment: II - A patient with mild systemic disease.                         After reviewing the risks and benefits, the patient                         was deemed in satisfactory condition to undergo the                         procedure.                        After obtaining informed consent, the colonoscope was  passed under direct vision. Throughout the procedure,                         the patient's blood pressure, pulse, and oxygen                         saturations were monitored continuously. The                         Colonoscope was introduced through the anus  and                         advanced to the the cecum, identified by appendiceal                         orifice and ileocecal valve. The colonoscopy was                         performed without difficulty. The patient tolerated                         the procedure well. The quality of the bowel                         preparation was excellent. Findings:      The perianal and digital rectal examinations were normal.      There was evidence of a prior end-to-end colo-colonic anastomosis in the       sigmoid colon. This was patent. The anastomosis was traversed.      A 4 mm polyp was found in the sigmoid colon. The polyp was sessile. The       polyp was removed with a cold biopsy forceps. Resection and retrieval       were complete.      A few small-mouthed diverticula were found in the entire colon.      Non-bleeding internal hemorrhoids were found during retroflexion. The       hemorrhoids were Grade I (internal hemorrhoids that do not prolapse). Impression:            - Patent end-to-end colo-colonic anastomosis.                        - One 4 mm polyp in the sigmoid colon, removed with a                         cold biopsy forceps. Resected and retrieved.                        - Diverticulosis in the entire examined colon.                        - Non-bleeding internal hemorrhoids. Recommendation:        - Discharge patient to home.                        - Resume previous diet.                        - Continue present medications.                        -  Await pathology results.                        - Repeat colonoscopy in 7 years for surveillance. Procedure Code(s):     --- Professional ---                        [phone], Colonoscopy, flexible; with biopsy, single or                         multiple Diagnosis Code(s):     --- Professional ---                        Z86.010, Personal history of colonic polyps                        K63.5, Polyp of colon CPT copyright 2019  American Medical Association. All rights reserved. The codes documented in this report are preliminary and upon coder review may  be revised to meet current compliance requirements. Lucilla Lame MD, MD 05/16/2020 8:47:38 AM This report has been signed electronically. Number of Addenda: 0 Note Initiated On: 05/16/2020 8:18 AM Scope Withdrawal Time: 0 hours 7 minutes 59 seconds  Total Procedure Duration: 0 hours 11 minutes 5 seconds  Estimated Blood Loss:  Estimated blood loss: none.      [facility]

## 2020-05-16 NOTE — H&P (Signed)
Lucilla Lame, MD [facility] ([place] [address] [place], [place] [address] Phone:[phone] Fax : [phone]  Primary Care Physician:  Towanda Malkin, MD Primary Gastroenterologist:  Dr. Allen Norris  Pre-Procedure History & Physical: HPI:  Elizabeth [name] is a 54 y.o. female is here for an colonoscopy.   Past Medical History:  Diagnosis Date  . Bipolar 1 disorder ([place])   . Depression   . Diabetes mellitus without complication ([place])   . Diverticulosis   . GERD (gastroesophageal reflux disease)   . Heart murmur    followed by PCP  . Hepatitis    B - "not active"  . Hypertension goal BP (blood pressure) < 130/80 01/17/2016  . Insomnia   . Irritable bowel syndrome   . Motion sickness    boats  . Obesity (BMI 30-39.9) 07/03/2015  . RA (rheumatoid arthritis) ([place])   . Sciatica     Past Surgical History:  Procedure Laterality Date  . BOWEL RESECTION    . BREAST BIOPSY Right 12/22/2019   affirm bx x marker, path pending  . BREAST BIOPSY    . CHOLECYSTECTOMY  1997   [place], [place]  . COLONOSCOPY WITH PROPOFOL N/A 09/07/2015   Procedure: COLONOSCOPY WITH PROPOFOL;  Surgeon: Lucilla Lame, MD;  Location: [place];  Service: Endoscopy;  Laterality: N/A;  . ESOPHAGOGASTRODUODENOSCOPY (EGD) WITH ESOPHAGEAL DILATION    . ESOPHAGOGASTRODUODENOSCOPY (EGD) WITH PROPOFOL N/A 09/07/2015   Procedure: ESOPHAGOGASTRODUODENOSCOPY (EGD) WITH PROPOFOL;  Surgeon: Lucilla Lame, MD;  Location: [place];  Service: Endoscopy;  Laterality: N/A;  . PARTIAL COLECTOMY N/A 01/24/2016   Procedure: SIGMOID COLECTOMY;  Surgeon: Florene Glen, MD;  Location: [facility];  Service: General;  Laterality: N/A;  . POLYPECTOMY N/A 09/07/2015   Procedure: POLYPECTOMY;  Surgeon: Lucilla Lame, MD;  Location: [place];  Service: Endoscopy;  Laterality: N/A;  . TUBAL LIGATION      Prior to Admission medications   Medication Sig Start Date End Date Taking? Authorizing Provider  metFORMIN  (GLUCOPHAGE-XR) 500 MG 24 hr tablet Take 1 tablet (500 mg total) by mouth 2 (two) times daily. 02/09/20  Yes Towanda Malkin, MD  Accu-Chek FastClix Lancets MISC USE TO CHECK BLOOD SUGAR TWICE DAILY 09/01/19   Delsa Grana, PA-C  aspirin 81 MG tablet Take 81 mg by mouth daily.    [provider]  atorvastatin (LIPITOR) 10 MG tablet Take 1 tablet (10 mg total) by mouth at bedtime. 02/11/20   Towanda Malkin, MD  azelastine (ASTELIN) 0.1 % nasal spray Place 2 sprays into both nostrils 2 (two) times daily. Use in each nostril as directed Patient not taking: Reported on 05/16/2020 07/01/19   Hubbard Hartshorn, FNP  B-D INS SYR ULTRAFINE 1CC/31G 31G X 5/16" 1 ML MISC use as directed TO INJECT METHOTREXATE 05/02/16   [provider]  Blood Glucose Monitoring Suppl (ACCU-CHEK NANO SMARTVIEW) w/Device KIT Use as directed once daily. LON 99, E11.9 02/12/18   Lada, Satira Anis, MD  cholecalciferol (VITAMIN D) 1000 units tablet Take 2,000 Units by mouth daily.     [provider]  clobetasol ointment (TEMOVATE) 0.05 % APPLY EXTERNALLY TO THE AFFECTED AREA TWICE DAILY 03/13/20   [provider]  folic acid (FOLVITE) 1 MG tablet Take 1 mg by mouth daily.    [provider]  gabapentin (NEURONTIN) 300 MG capsule TAKE 1 CAPSULE BY MOUTH EVERY MORNING TAKE 1 CAPSULE AT NOON AND TAKE 2 CAPSULAS AT BEDTIME 03/20/20  Lebron Conners D, MD  glucose blood (ACCU-CHEK SMARTVIEW) test strip USE TO TEST ONCE DAILY 03/10/20   Towanda Malkin, MD  linaclotide [facility]) 290 MCG CAPS capsule TAKE 1 CAPSULE(290 MCG) BY MOUTH DAILY 02/15/20   Towanda Malkin, MD  Methotrexate Sodium (METHOTREXATE, PF,) 50 MG/2ML injection 15 mg once a week. 03/21/20   [provider]  Multiple Vitamins-Minerals (MULTIVITAMIN/EXTRA VITAMIN D3 PO) Take by mouth.    [provider]  OLANZapine (ZYPREXA) 15 MG tablet Take 15 mg by mouth at bedtime. 03/21/20   [provider]  Omega-3 Fatty Acids (FISH OIL PO) Take by mouth.    [provider]  OVER THE COUNTER MEDICATION once daily Herbal Name: Stebbins    [provider]  triamcinolone ointment (KENALOG) 0.5 % Apply 1 application topically 2 (two) times daily. 01/07/20   Hubbard Hartshorn, FNP  Turmeric 500 MG CAPS Take by mouth.    [provider]    Allergies as of 04/05/2020 - Review Complete 04/05/2020  Allergen Reaction Noted  . Ace inhibitors Swelling 02/11/2016  . Enalapril Swelling 02/11/2016  . Penicillins Hives and Swelling 05/07/2015  . Statins Hives 12/05/2017    Family History  Problem Relation Age of Onset  . Hypertension Mother   . Diabetes Mother   . Arthritis/Rheumatoid Mother   . Hyperlipidemia Mother   . Alcohol abuse Father   . Esophageal varices Father   . Diverticulosis Maternal Aunt   . Diverticulosis Maternal Grandmother   . Diabetes Maternal Grandmother   . Heart disease Maternal Grandmother   . Dementia Maternal Grandmother   . Diabetes Brother   . Kidney disease Brother   . Aneurysm Maternal Grandfather   . Diabetes Paternal Grandmother   . Healthy Brother   . Depression Daughter        bipolar disorder  . Depression Daughter        bipolar disorder  . Breast cancer Neg Hx     Social History   Socioeconomic History  . Marital status: Married    Spouse name: Mortimer Fries  . Number of children: 3  . Years of education: some college  . Highest education level: 12th grade  Occupational History  . Occupation: Disabled  Tobacco Use  . Smoking status: Former Smoker    Packs/day: 1.00    Years: 1.00    Pack years: 1.00    Types: Cigarettes    Quit date: 05/06/2015    Years since quitting: 5.0  . Smokeless tobacco: Never Used  . Tobacco comment: smoking cessation materials not required  Substance and Sexual Activity  . Alcohol use: Yes    Alcohol/week: 0.0 standard drinks    Comment: rarely small glass of wine  . Drug use: No  .  Sexual activity: Yes    Birth control/protection: None  Other Topics Concern  . Not on file  Social History Narrative  . Not on file   Social Determinants of Health   Financial Resource Strain: Low Risk   . Difficulty of Paying Living Expenses: Not hard at all  Food Insecurity: No Food Insecurity  . Worried About Charity fundraiser in the Last Year: Never true  . Ran Out of Food in the Last Year: Never true  Transportation Needs: No Transportation Needs  . Lack of Transportation (Medical): No  . Lack of Transportation (Non-Medical): No  Physical Activity: Sufficiently Active  . Days of Exercise per Week: 7 days  . Minutes of  Exercise per Session: 50 min  Stress: No Stress Concern Present  . Feeling of Stress : Not at all  Social Connections: Slightly Isolated  . Frequency of Communication with Friends and Family: More than three times a week  . Frequency of Social Gatherings with Friends and Family: Three times a week  . Attends Religious Services: More than 4 times per year  . Active Member of Clubs or Organizations: No  . Attends Archivist Meetings: Never  . Marital Status: Married  Human resources officer Violence: Not At Risk  . Fear of Current or Ex-Partner: No  . Emotionally Abused: No  . Physically Abused: No  . Sexually Abused: No    Review of Systems: See HPI, otherwise negative ROS  Physical Exam: BP (!) 150/86   Pulse 72   Temp (!) 97 F (36.1 C) (Temporal)   Resp 18   Ht 5' 6" (1.676 m)   Wt 107.5 kg   LMP 07/09/2014   SpO2 99%   BMI 38.25 kg/m  General:   Alert,  pleasant and cooperative in NAD Head:  Normocephalic and atraumatic. Neck:  Supple; no masses or thyromegaly. Lungs:  Clear throughout to auscultation.    Heart:  Regular rate and rhythm. Abdomen:  Soft, nontender and nondistended. Normal bowel sounds, without guarding, and without rebound.   Neurologic:  Alert and  oriented x4;  grossly normal  neurologically.  Impression/Plan: Elizabeth [name] is here for an colonoscopy to be performed for history of colon polyps in September 2016  Risks, benefits, limitations, and alternatives regarding  colonoscopy have been reviewed with the patient.  Questions have been answered.  All parties agreeable.   Lucilla Lame, MD  05/16/2020, 8:20 AM

## 2020-05-16 NOTE — Anesthesia Procedure Notes (Signed)
Date/Time: 05/16/2020 8:30 AM Performed by: Johnna Acosta, CRNA Pre-anesthesia Checklist: Patient identified, Emergency Drugs available, Suction available, Patient being monitored and Timeout performed Patient Re-evaluated:Patient Re-evaluated prior to induction Oxygen Delivery Method: Nasal cannula Preoxygenation: Pre-oxygenation with 100% oxygen Induction Type: IV induction

## 2020-05-17 ENCOUNTER — Encounter: Payer: Self-pay | Admitting: *Deleted

## 2020-05-17 LAB — SURGICAL PATHOLOGY

## 2020-05-18 ENCOUNTER — Encounter: Payer: Self-pay | Admitting: Gastroenterology

## 2020-06-06 ENCOUNTER — Other Ambulatory Visit: Payer: Self-pay

## 2020-06-06 MED ORDER — ACCU-CHEK FASTCLIX LANCETS MISC: 5 refills | Status: DC

## 2020-06-20 ENCOUNTER — Inpatient Hospital Stay: Admission: RE | Admit: 2020-06-20 | Payer: Medicare Other | Source: Ambulatory Visit

## 2020-06-28 ENCOUNTER — Other Ambulatory Visit: Payer: Medicare Other

## 2020-06-29 ENCOUNTER — Ambulatory Visit: Payer: Medicare Other | Admitting: General Surgery

## 2020-07-04 ENCOUNTER — Telehealth: Payer: Self-pay

## 2020-07-04 ENCOUNTER — Encounter: Payer: Self-pay | Admitting: Internal Medicine

## 2020-07-04 ENCOUNTER — Other Ambulatory Visit: Payer: Self-pay

## 2020-07-04 DIAGNOSIS — E119 Type 2 diabetes mellitus without complications: Secondary | ICD-10-CM

## 2020-07-04 MED ORDER — ACCU-CHEK NANO SMARTVIEW W/DEVICE KIT: PACK | 0 refills | Status: DC

## 2020-07-04 NOTE — Progress Notes (Signed)
Patient's glucose meter has broken and she needs a new one. She has had her old one since 2019.

## 2020-07-04 NOTE — Telephone Encounter (Signed)
Returned called, no answer.   Copied from [place] [phone]. Topic: General - Call Back - No Documentation >> Jul 04, 2020  5:79 AM Doyce Loose D wrote: Reason for CRM: Anderson Malta with [employer] is calling on behalf of the pt to request a Kidney function test to go along with the A1C [place] contact number [phone] ext [phone] or contact pt with decision

## 2020-07-06 ENCOUNTER — Ambulatory Visit
Admission: RE | Admit: 2020-07-06 | Discharge: 2020-07-06 | Disposition: A | Discharge status: Home / Self Care | Payer: Medicare Other | Source: Ambulatory Visit | Attending: General Surgery | Admitting: General Surgery

## 2020-07-06 ENCOUNTER — Other Ambulatory Visit: Payer: Self-pay

## 2020-07-06 ENCOUNTER — Encounter: Payer: Self-pay | Admitting: Internal Medicine

## 2020-07-06 DIAGNOSIS — D241 Benign neoplasm of right breast: Secondary | ICD-10-CM

## 2020-07-06 MED ORDER — ACCU-CHEK GUIDE VI STRP: ORAL_STRIP | 12 refills | Status: DC

## 2020-07-06 MED ORDER — ACCU-CHEK SOFTCLIX LANCETS MISC
12 refills | Status: DC
Start: 2020-07-06 — End: 2021-04-12

## 2020-07-13 ENCOUNTER — Ambulatory Visit (INDEPENDENT_AMBULATORY_CARE_PROVIDER_SITE_OTHER): Payer: Medicare Other | Admitting: General Surgery

## 2020-07-13 ENCOUNTER — Encounter: Payer: Self-pay | Admitting: General Surgery

## 2020-07-13 ENCOUNTER — Other Ambulatory Visit: Payer: Self-pay

## 2020-07-13 VITALS — BP 144/100 | HR 71 | Temp 98.0°F | Ht 66.0 in | Wt 246.0 lb

## 2020-07-13 DIAGNOSIS — D241 Benign neoplasm of right breast: Secondary | ICD-10-CM

## 2020-07-13 NOTE — Patient Instructions (Signed)
Follow up next year with your PCP or GYN provider for annual routine mammograms and exam.  Continue self breast exams. Call office for any new breast issues or concerns. Follow-up with our office as needed.    Breast Self-Awareness Breast self-awareness is knowing how your breasts look and feel. Doing breast self-awareness is important. It allows you to catch a breast problem early while it is still small and can be treated. All women should do breast self-awareness, including women who have had breast implants. Tell your doctor if you notice a change in your breasts. What you need:  A mirror.  A well-lit room. How to do a breast self-exam A breast self-exam is one way to learn what is normal for your breasts and to check for changes. To do a breast self-exam: Look for changes  1. Take off all the clothes above your waist. 2. Stand in front of a mirror in a room with good lighting. 3. Put your hands on your hips. 4. Push your hands down. 5. Look at your breasts and nipples in the mirror to see if one breast or nipple looks different from the other. Check to see if: ? The shape of one breast is different. ? The size of one breast is different. ? There are wrinkles, dips, and bumps in one breast and not the other. 6. Look at each breast for changes in the skin, such as: ? Redness. ? Scaly areas. 7. Look for changes in your nipples, such as: ? Liquid around the nipples. ? Bleeding. ? Dimpling. ? Redness. ? A change in where the nipples are. Feel for changes  1. Lie on your back on the floor. 2. Feel each breast. To do this, follow these steps: ? Pick a breast to feel. ? Put the arm closest to that breast above your head. ? Use your other arm to feel the nipple area of your breast. Feel the area with the pads of your three middle fingers by making small circles with your fingers. For the first circle, press lightly. For the second circle, press harder. For the third circle, press  even harder. ? Keep making circles with your fingers at the different pressures as you move down your breast. Stop when you feel your ribs. ? Move your fingers a little toward the center of your body. ? Start making circles with your fingers again, this time going up until you reach your collarbone. ? Keep making up-and-down circles until you reach your armpit. Remember to keep using the three pressures. ? Feel the other breast in the same way. 3. Sit or stand in the tub or shower. 4. With soapy water on your skin, feel each breast the same way you did in step 2 when you were lying on the floor. Write down what you find Writing down what you find can help you remember what to tell your doctor. Write down:  What is normal for each breast.  Any changes you find in each breast, including: ? The kind of changes you find. ? Whether you have pain. ? Size and location of any lumps.  When you last had your menstrual period. General tips  Check your breasts every month.  If you are breastfeeding, the best time to check your breasts is after you feed your baby or after you use a breast pump.  If you get menstrual periods, the best time to check your breasts is 5-7 days after your menstrual period is over.  With  time, you will become comfortable with the self-exam, and you will begin to know if there are changes in your breasts. Contact a doctor if you:  See a change in the shape or size of your breasts or nipples.  See a change in the skin of your breast or nipples, such as red or scaly skin.  Have fluid coming from your nipples that is not normal.  Find a lump or thick area that was not there before.  Have pain in your breasts.  Have any concerns about your breast health. Summary  Breast self-awareness includes looking for changes in your breasts, as well as feeling for changes within your breasts.  Breast self-awareness should be done in front of a mirror in a well-lit  room.  You should check your breasts every month. If you get menstrual periods, the best time to check your breasts is 5-7 days after your menstrual period is over.  Let your doctor know of any changes you see in your breasts, including changes in size, changes on the skin, pain or tenderness, or fluid from your nipples that is not normal. This information is not intended to replace advice given to you by your health care provider. Make sure you discuss any questions you have with your health care provider. Document Revised: 07/14/2018 Document Reviewed: 07/14/2018 Elsevier Patient Education  [place].

## 2020-07-13 NOTE — Progress Notes (Signed)
Patient ID: Elizabeth [name], female   DOB: Jul 24, 1966, 54 y.o.   MRN: [id number]  Chief Complaint  Patient presents with  . Follow-up    HPI Elizabeth [name] is a 54 y.o. female.   I initially saw her 6 months ago.  My initial history of present illness is copied here:   "She is here today for further evaluation of a intraductal papilloma.  She reports that on her annual mammography, they have been monitoring an area in the right retroareolar zone.  Previous imaging has been most suggestive of a cluster of benign cysts.  Her most recent mammogram, however, demonstrated a 3 mm cluster of microcalcifications.  She underwent stereotactic core needle biopsy and the final pathology was as follows:  DIAGNOSIS:  A. RIGHT BREAST, RETROAREOLAR; STEREOTACTIC NEEDLE CORE BIOPSY:  - CYSTICALLY DILATED DUCTS WITH INSPISSATED SECRETIONS.  - 2 MM FRAGMENT OF INTRADUCTAL PAPILLOMA.  - FOCAL APOCRINE METAPLASIA WITH LUMINAL CALCIFICATIONS.  - USUAL DUCTAL HYPERPLASIA AND APOCRINE CYSTS.  - NEGATIVE FOR ATYPIA AND MALIGNANCY.   She is here today to discuss further evaluation and management.  Age of menarche was 41, age of menopause was in her early 30s.  She had 3 pregnancies and breast-fed all 3 children.  She used oral contraceptive pills for about 3 months but subsequently reverted to an intrauterine device.  She never took any hormone replacement therapy.  She is never had a prior breast biopsy.  She states that she performs monthly self exams and has never palpated a lump.  She denies any nipple discharge or skin changes.  No breast pain.  There is no family history of breast, uterine, or ovarian cancer."  At the time of that visit, we discussed the possibility of upstaging that can be associated with intraductal papillomas.  I felt that Elizabeth [name] was low risk for this.  She was also concerned about the cosmetic effect on her breast if she went through with a lumpectomy.  We agreed upon close interval  imaging and follow-up.  She had a repeat mammogram performed on July 29 with findings copied here:  CLINICAL DATA:  Follow-up for a right breast papilloma, which was found on stereotactic core needle biopsy of calcifications in the right breast performed on 12/22/2019. Excision was not performed, with short-term follow-up recommended. Patient has no current breast complaints.  EXAM: DIGITAL DIAGNOSTIC UNILATERAL RIGHT MAMMOGRAM WITH TOMO AND CAD  COMPARISON:  Previous exam(s).  ACR Breast Density Category b: There are scattered areas of fibroglandular density.  FINDINGS: There are few residual punctate calcifications posterior to the X shaped biopsy clip. These are stable from the post biopsy marker clip mammograms. There are no new or suspicious masses, no areas of architectural distortion and no new calcifications.  Mammographic images were processed with CAD.  IMPRESSION: 1. No evidence of breast malignancy. 2. Few residual benign calcifications posterior to the X shaped biopsy clip in the right breast. Benign papilloma on the prior biopsy was without atypia.  RECOMMENDATION: Screening mammogram in January 2022, last bilateral mammogram dated 12/14/2019.(Code:SM-[id number])  I have discussed the findings and recommendations with the patient. If applicable, a reminder letter will be sent to the patient regarding the next appointment.  BI-RADS CATEGORY  2: Benign.   Electronically Signed   By: Lajean Manes M.D.   On: 07/06/2020 09:58  Today, she denies any breast symptoms.  No mastalgia, no changes in her skin.  No drainage from her nipples.  She reports performing monthly  breast exams and has not noticed any new lumps or masses.     Past Medical History:  Diagnosis Date  . Bipolar 1 disorder ([place])   . Depression   . Diabetes mellitus without complication ([place])   . Diverticulosis   . GERD (gastroesophageal reflux disease)   . Heart murmur    followed  by PCP  . Hepatitis    B - "not active"  . Hypertension goal BP (blood pressure) < 130/80 01/17/2016  . Insomnia   . Irritable bowel syndrome   . Motion sickness    boats  . Obesity (BMI 30-39.9) 07/03/2015  . RA (rheumatoid arthritis) ([place])   . Sciatica     Past Surgical History:  Procedure Laterality Date  . BOWEL RESECTION    . BREAST BIOPSY Right 12/22/2019   affirm bx x marker, path pending  . BREAST BIOPSY    . CHOLECYSTECTOMY  1997   [place], [place]  . COLONOSCOPY WITH PROPOFOL N/A 09/07/2015   Procedure: COLONOSCOPY WITH PROPOFOL;  Surgeon: Lucilla Lame, MD;  Location: [place];  Service: Endoscopy;  Laterality: N/A;  . COLONOSCOPY WITH PROPOFOL N/A 05/16/2020   Procedure: COLONOSCOPY WITH PROPOFOL;  Surgeon: Lucilla Lame, MD;  Location: [facility];  Service: Endoscopy;  Laterality: N/A;  . ESOPHAGOGASTRODUODENOSCOPY (EGD) WITH ESOPHAGEAL DILATION    . ESOPHAGOGASTRODUODENOSCOPY (EGD) WITH PROPOFOL N/A 09/07/2015   Procedure: ESOPHAGOGASTRODUODENOSCOPY (EGD) WITH PROPOFOL;  Surgeon: Lucilla Lame, MD;  Location: [place];  Service: Endoscopy;  Laterality: N/A;  . PARTIAL COLECTOMY N/A 01/24/2016   Procedure: SIGMOID COLECTOMY;  Surgeon: Florene Glen, MD;  Location: [facility];  Service: General;  Laterality: N/A;  . POLYPECTOMY N/A 09/07/2015   Procedure: POLYPECTOMY;  Surgeon: Lucilla Lame, MD;  Location: [place];  Service: Endoscopy;  Laterality: N/A;  . TUBAL LIGATION      Family History  Problem Relation Age of Onset  . Hypertension Mother   . Diabetes Mother   . Arthritis/Rheumatoid Mother   . Hyperlipidemia Mother   . Alcohol abuse Father   . Esophageal varices Father   . Diverticulosis Maternal Aunt   . Diverticulosis Maternal Grandmother   . Diabetes Maternal Grandmother   . Heart disease Maternal Grandmother   . Dementia Maternal Grandmother   . Diabetes Brother   . Kidney disease Brother   . Aneurysm Maternal Grandfather   .  Diabetes Paternal Grandmother   . Healthy Brother   . Depression Daughter        bipolar disorder  . Depression Daughter        bipolar disorder  . Breast cancer Neg Hx     Social History Social History   Tobacco Use  . Smoking status: Former Smoker    Packs/day: 1.00    Years: 1.00    Pack years: 1.00    Types: Cigarettes    Quit date: 05/06/2015    Years since quitting: 5.1  . Smokeless tobacco: Never Used  . Tobacco comment: smoking cessation materials not required  Vaping Use  . Vaping Use: Never used  Substance Use Topics  . Alcohol use: Yes    Alcohol/week: 0.0 standard drinks    Comment: rarely small glass of wine  . Drug use: No    Allergies  Allergen Reactions  . Ace Inhibitors Swelling    Angioedema (specifically from enalapril)  . Enalapril Swelling    angioedema  . Penicillins Hives and Swelling    Has patient had a PCN reaction  causing immediate rash, facial/tongue/throat swelling, SOB or lightheadedness with hypotension: Yes Has patient had a PCN reaction causing severe rash involving mucus membranes or skin necrosis: No Has patient had a PCN reaction that required hospitalization Yes Has patient had a PCN reaction occurring within the last 10 years: No If all of the above answers are "NO", then may proceed with Cephalosporin use.  . Statins Hives    Current Outpatient Medications  Medication Sig Dispense Refill  . Accu-Chek FastClix Lancets MISC USE TO CHECK BLOOD SUGAR TWICE DAILY 100 each 5  . Accu-Chek Softclix Lancets lancets Use as instructed 100 each 12  . aspirin 81 MG tablet Take 81 mg by mouth daily.    Marland Kitchen atorvastatin (LIPITOR) 10 MG tablet Take 1 tablet (10 mg total) by mouth at bedtime. 90 tablet 3  . azelastine (ASTELIN) 0.1 % nasal spray Place 2 sprays into both nostrils 2 (two) times daily. Use in each nostril as directed 30 mL 12  . B-D INS SYR ULTRAFINE 1CC/31G 31G X 5/16" 1 ML MISC use as directed TO INJECT METHOTREXATE  1  . Blood  Glucose Monitoring Suppl (ACCU-CHEK NANO SMARTVIEW) w/Device KIT Use as directed once daily. LON 99, E11.9 1 kit 0  . cholecalciferol (VITAMIN D) 1000 units tablet Take 2,000 Units by mouth daily.     . clobetasol ointment (TEMOVATE) 0.05 % APPLY EXTERNALLY TO THE AFFECTED AREA TWICE DAILY    . folic acid (FOLVITE) 1 MG tablet Take 1 mg by mouth daily.    Marland Kitchen gabapentin (NEURONTIN) 300 MG capsule TAKE 1 CAPSULE BY MOUTH EVERY MORNING TAKE 1 CAPSULE AT NOON AND TAKE 2 CAPSULAS AT BEDTIME 360 capsule 1  . glucose blood (ACCU-CHEK GUIDE) test strip Use as instructed 100 each 12  . glucose blood (ACCU-CHEK SMARTVIEW) test strip USE TO TEST ONCE DAILY 100 strip 6  . linaclotide (LINZESS) 290 MCG CAPS capsule TAKE 1 CAPSULE(290 MCG) BY MOUTH DAILY 90 capsule 1  . metFORMIN (GLUCOPHAGE-XR) 500 MG 24 hr tablet Take 1 tablet (500 mg total) by mouth 2 (two) times daily. 180 tablet 1  . Methotrexate Sodium (METHOTREXATE, PF,) 50 MG/2ML injection 15 mg once a week.    . Multiple Vitamins-Minerals (MULTIVITAMIN/EXTRA VITAMIN D3 PO) Take by mouth.    . OLANZapine (ZYPREXA) 15 MG tablet Take 15 mg by mouth at bedtime.    . Omega-3 Fatty Acids (FISH OIL PO) Take by mouth.    Marland Kitchen OVER THE COUNTER MEDICATION once daily Herbal Name: Goli    . triamcinolone ointment (KENALOG) 0.5 % Apply 1 application topically 2 (two) times daily. 120 g 1  . Turmeric 500 MG CAPS Take by mouth.     No current facility-administered medications for this visit.    Review of Systems Review of Systems  All other systems reviewed and are negative.   Blood pressure (!) 144/100, pulse 71, temperature 98 F (36.7 C), height '5\' 6"'  (1.676 m), weight 246 lb (111.6 kg), last menstrual period 07/09/2014, SpO2 97 %. Body mass index is 39.71 kg/m.  Physical Exam Physical Exam Constitutional:      General: She is not in acute distress.    Appearance: She is obese.  HENT:     Head: Normocephalic and atraumatic.     Nose:     Comments:  Covered with a mask    Mouth/Throat:     Comments: Covered with a mask Eyes:     General: No scleral icterus.  Right eye: No discharge.        Left eye: No discharge.  Neck:     Comments: No palpable cervical or supraclavicular lymphadenopathy. Cardiovascular:     Rate and Rhythm: Normal rate and regular rhythm.     Pulses: Normal pulses.  Pulmonary:     Effort: Pulmonary effort is normal. No respiratory distress.  Chest:     Breasts:        Right: Normal.        Left: Normal.  Genitourinary:    Comments: Deferred Musculoskeletal:        General: No deformity or signs of injury.  Lymphadenopathy:     Upper Body:     Right upper body: No supraclavicular, axillary or pectoral adenopathy.     Left upper body: No supraclavicular, axillary or pectoral adenopathy.  Skin:    General: Skin is warm and dry.  Neurological:     General: No focal deficit present.     Mental Status: She is alert and oriented to person, place, and time.  Psychiatric:        Behavior: Behavior normal.     Data Reviewed I reviewed the recent mammogram as copied above and agree with the radiologist's assessment.  Assessment This is a 54 year old woman who had a breast biopsy in January that showed intraductal papilloma without atypia.  We elected to pursue conservative management with close surveillance.  Her recent mammogram was BI-RADS 2, benign.  The recommendation was for her to return to annual screening mammography.    Plan At this time, she does not require surgical follow-up.  She should continue to have annual mammograms and perform monthly self breast examinations.  All of this can be managed by her primary care provider or OB/GYN.  I will see her on an as-needed basis.    Fredirick Maudlin 07/13/2020, 9:40 AM

## 2020-07-17 ENCOUNTER — Other Ambulatory Visit: Payer: Self-pay

## 2020-07-17 NOTE — Telephone Encounter (Signed)
Singulair is not her med list from our recent visit. Need to know if is a medicine she is taking and if so, who prescribed it for her. (before refilled) Thanks, [facility]
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
## 2020-07-19 ENCOUNTER — Ambulatory Visit (INDEPENDENT_AMBULATORY_CARE_PROVIDER_SITE_OTHER): Payer: Medicare Other | Admitting: Internal Medicine

## 2020-07-19 ENCOUNTER — Encounter: Payer: Self-pay | Admitting: Internal Medicine

## 2020-07-19 ENCOUNTER — Other Ambulatory Visit: Payer: Self-pay

## 2020-07-19 VITALS — BP 130/80 | HR 91 | Temp 97.6°F | Resp 16 | Ht 66.0 in | Wt 248.5 lb

## 2020-07-19 DIAGNOSIS — E785 Hyperlipidemia, unspecified: Secondary | ICD-10-CM

## 2020-07-19 DIAGNOSIS — I10 Essential (primary) hypertension: Secondary | ICD-10-CM

## 2020-07-19 DIAGNOSIS — D241 Benign neoplasm of right breast: Secondary | ICD-10-CM

## 2020-07-19 DIAGNOSIS — M05742 Rheumatoid arthritis with rheumatoid factor of left hand without organ or systems involvement: Secondary | ICD-10-CM

## 2020-07-19 DIAGNOSIS — E1165 Type 2 diabetes mellitus with hyperglycemia: Secondary | ICD-10-CM

## 2020-07-19 DIAGNOSIS — J310 Chronic rhinitis: Secondary | ICD-10-CM

## 2020-07-19 DIAGNOSIS — M05741 Rheumatoid arthritis with rheumatoid factor of right hand without organ or systems involvement: Secondary | ICD-10-CM

## 2020-07-19 DIAGNOSIS — F3132 Bipolar disorder, current episode depressed, moderate: Secondary | ICD-10-CM

## 2020-07-19 NOTE — Patient Instructions (Signed)
Mediterranean Diet A Mediterranean diet refers to food and lifestyle choices that are based on the traditions of countries located on the [place] Sea. This way of eating has been shown to help prevent certain conditions and improve outcomes for people who have chronic diseases, like kidney disease and heart disease. What are tips for following this plan? Lifestyle  Cook and eat meals together with your family, when possible.  Drink enough fluid to keep your urine clear or pale yellow.  Be physically active every day. This includes: ? Aerobic exercise like running or swimming. ? Leisure activities like gardening, walking, or housework.  Get 7-8 hours of sleep each night.  If recommended by your health care provider, drink red wine in moderation. This means 1 glass a day for nonpregnant women and 2 glasses a day for men. A glass of wine equals 5 oz (150 mL). Reading food labels  Check the serving size of packaged foods. For foods such as rice and pasta, the serving size refers to the amount of cooked product, not dry.  Check the total fat in packaged foods. Avoid foods that have saturated fat or trans fats.  Check the ingredients list for added sugars, such as corn syrup.   Shopping  At the grocery store, buy most of your food from the areas near the walls of the store. This includes: ? Fresh fruits and vegetables (produce). ? Grains, beans, nuts, and seeds. Some of these may be available in unpackaged forms or large amounts (in bulk). ? Fresh seafood. ? Poultry and eggs. ? Low-fat dairy products.  Buy whole ingredients instead of prepackaged foods.  Buy fresh fruits and vegetables in-season from local farmers markets.  Buy frozen fruits and vegetables in resealable bags.  If you do not have access to quality fresh seafood, buy precooked frozen shrimp or canned fish, such as tuna, salmon, or sardines.  Buy small amounts of raw or cooked vegetables, salads, or olives from  the deli or salad bar at your store.  Stock your pantry so you always have certain foods on hand, such as olive oil, canned tuna, canned tomatoes, rice, pasta, and beans. Cooking  Cook foods with extra-virgin olive oil instead of using butter or other vegetable oils.  Have meat as a side dish, and have vegetables or grains as your main dish. This means having meat in small portions or adding small amounts of meat to foods like pasta or stew.  Use beans or vegetables instead of meat in common dishes like chili or lasagna.  Experiment with different cooking methods. Try roasting or broiling vegetables instead of steaming or sauteing them.  Add frozen vegetables to soups, stews, pasta, or rice.  Add nuts or seeds for added healthy fat at each meal. You can add these to yogurt, salads, or vegetable dishes.  Marinate fish or vegetables using olive oil, lemon juice, garlic, and fresh herbs. Meal planning  Plan to eat 1 vegetarian meal one day each week. Try to work up to 2 vegetarian meals, if possible.  Eat seafood 2 or more times a week.  Have healthy snacks readily available, such as: ? Vegetable sticks with hummus. ? Greek yogurt. ? Fruit and nut trail mix.  Eat balanced meals throughout the week. This includes: ? Fruit: 2-3 servings a day ? Vegetables: 4-5 servings a day ? Low-fat dairy: 2 servings a day ? Fish, poultry, or lean meat: 1 serving a day ? Beans and legumes: 2 or more servings a week ?   Nuts and seeds: 1-2 servings a day ? Whole grains: 6-8 servings a day ? Extra-virgin olive oil: 3-4 servings a day  Limit red meat and sweets to only a few servings a month   What are my food choices?  Mediterranean diet ? Recommended  Grains: Whole-grain pasta. Brown rice. Bulgar wheat. Polenta. Couscous. Whole-wheat bread. Oatmeal. Quinoa.  Vegetables: Artichokes. Beets. Broccoli. Cabbage. Carrots. Eggplant. Green beans. Chard. Kale. Spinach. Onions. Leeks. Peas. Squash.  Tomatoes. Peppers. Radishes.  Fruits: Apples. Apricots. Avocado. Berries. Bananas. Cherries. Dates. Figs. Grapes. Lemons. Melon. Oranges. Peaches. Plums. Pomegranate.  Meats and other protein foods: Beans. Almonds. Sunflower seeds. Pine nuts. Peanuts. Cod. Salmon. Scallops. Shrimp. Tuna. Tilapia. Clams. Oysters. Eggs.  Dairy: Low-fat milk. Cheese. Greek yogurt.  Beverages: Water. Red wine. Herbal tea.  Fats and oils: Extra virgin olive oil. Avocado oil. Grape seed oil.  Sweets and desserts: Greek yogurt with honey. Baked apples. Poached pears. Trail mix.  Seasoning and other foods: Basil. Cilantro. Coriander. Cumin. Mint. Parsley. Sage. Rosemary. Tarragon. Garlic. Oregano. Thyme. Pepper. Balsalmic vinegar. Tahini. Hummus. Tomato sauce. Olives. Mushrooms. ? Limit these  Grains: Prepackaged pasta or rice dishes. Prepackaged cereal with added sugar.  Vegetables: Deep fried potatoes (french fries).  Fruits: Fruit canned in syrup.  Meats and other protein foods: Beef. Pork. Lamb. Poultry with skin. Hot dogs. Bacon.  Dairy: Ice cream. Sour cream. Whole milk.  Beverages: Juice. Sugar-sweetened soft drinks. Beer. Liquor and spirits.  Fats and oils: Butter. Canola oil. Vegetable oil. Beef fat (tallow). Lard.  Sweets and desserts: Cookies. Cakes. Pies. Candy.  Seasoning and other foods: Mayonnaise. Premade sauces and marinades. The items listed may not be a complete list. Talk with your dietitian about what dietary choices are right for you. Summary  The Mediterranean diet includes both food and lifestyle choices.  Eat a variety of fresh fruits and vegetables, beans, nuts, seeds, and whole grains.  Limit the amount of red meat and sweets that you eat.  Talk with your health care provider about whether it is safe for you to drink red wine in moderation. This means 1 glass a day for nonpregnant women and 2 glasses a day for men. A glass of wine equals 5 oz (150 mL). This information  is not intended to replace advice given to you by your health care provider. Make sure you discuss any questions you have with your health care provider. Document Revised: 07/25/2016 Document Reviewed: 07/18/2016 Elsevier Patient Education  2020 [employer].  

## 2020-07-20 LAB — BASIC METABOLIC PANEL WITH GFR
BUN: 10 mg/dL (ref 7–25)
CO2: 31 mmol/L (ref 20–32)
Calcium: 10.4 mg/dL (ref 8.6–10.4)
Chloride: 102 mmol/L (ref 98–110)
Creat: 0.78 mg/dL (ref 0.50–1.05)
GFR, Est African American: 101 mL/min/{1.73_m2} (ref >=60)
GFR, Est Non African American: 87 mL/min/{1.73_m2} (ref >=60)
Glucose, Bld: 192 mg/dL — ABNORMAL HIGH (ref 65–99)
Potassium: 4.4 mmol/L (ref 3.5–5.3)
Sodium: 139 mmol/L (ref 135–146)

## 2020-07-20 LAB — MICROALBUMIN / CREATININE URINE RATIO
Creatinine, Urine: 162 mg/dL (ref 20–275)
Microalb Creat Ratio: 9 mcg/mg creat (ref <30)
Microalb, Ur: 1.5 mg/dL

## 2020-07-20 LAB — LIPID PANEL
Cholesterol: 134 mg/dL (ref <200)
HDL: 47 mg/dL — ABNORMAL LOW (ref >=50)
LDL Cholesterol (Calc): 66 mg/dL (calc)
Non-HDL Cholesterol (Calc): 87 mg/dL (calc) (ref <130)
Total CHOL/HDL Ratio: 2.9 (calc) (ref <5.0)
Triglycerides: 133 mg/dL (ref <150)

## 2020-07-29 ENCOUNTER — Encounter: Payer: Self-pay | Admitting: Internal Medicine

## 2020-07-29 ENCOUNTER — Other Ambulatory Visit: Payer: Self-pay | Admitting: Internal Medicine

## 2020-07-30 ENCOUNTER — Other Ambulatory Visit: Payer: Self-pay | Admitting: Internal Medicine

## 2020-07-31 ENCOUNTER — Other Ambulatory Visit: Payer: Self-pay

## 2020-07-31 MED ORDER — METFORMIN HCL ER 500 MG PO TB24: 500.0000 mg | ORAL_TABLET | Freq: Two times a day (BID) | ORAL | 1 refills | Status: DC

## 2020-07-31 NOTE — Telephone Encounter (Signed)
Per Mychart message patient sent:  Hello I am so embarrassed  to say that I have been taking metformin wrong since I started back taking it.   I never read  the instructions on it  I thought I was suppose to  take 2 before a meal and that is incorrect.  I was telling my mom that metformin wasn't working for me , she asked me what does the instruction say and that's when I realize I was taking it incorrectly.  So now I am taking it the right way.  I was told my the pharmacist to reach out to my doctor for a refill on the  metformin.   Thank You!

## 2020-08-10 ENCOUNTER — Encounter: Payer: Self-pay | Admitting: Internal Medicine

## 2020-08-15 ENCOUNTER — Encounter: Payer: Self-pay | Admitting: Internal Medicine

## 2020-08-15 ENCOUNTER — Other Ambulatory Visit: Payer: Self-pay | Admitting: Internal Medicine

## 2020-08-30 ENCOUNTER — Encounter: Payer: Self-pay | Admitting: Internal Medicine

## 2020-09-07 ENCOUNTER — Encounter: Payer: Self-pay | Admitting: Internal Medicine

## 2020-09-07 ENCOUNTER — Ambulatory Visit (INDEPENDENT_AMBULATORY_CARE_PROVIDER_SITE_OTHER): Payer: Medicare Other | Admitting: Internal Medicine

## 2020-09-07 ENCOUNTER — Other Ambulatory Visit: Payer: Self-pay

## 2020-09-07 VITALS — BP 124/80 | HR 84 | Temp 97.5°F | Resp 16 | Ht 66.0 in | Wt 251.3 lb

## 2020-09-07 DIAGNOSIS — M05742 Rheumatoid arthritis with rheumatoid factor of left hand without organ or systems involvement: Secondary | ICD-10-CM

## 2020-09-07 DIAGNOSIS — R1033 Periumbilical pain: Secondary | ICD-10-CM | POA: Diagnosis not present

## 2020-09-07 DIAGNOSIS — E1165 Type 2 diabetes mellitus with hyperglycemia: Secondary | ICD-10-CM

## 2020-09-07 DIAGNOSIS — R42 Dizziness and giddiness: Secondary | ICD-10-CM

## 2020-09-07 DIAGNOSIS — R110 Nausea: Secondary | ICD-10-CM

## 2020-09-07 DIAGNOSIS — M05741 Rheumatoid arthritis with rheumatoid factor of right hand without organ or systems involvement: Secondary | ICD-10-CM

## 2020-09-07 DIAGNOSIS — I10 Essential (primary) hypertension: Secondary | ICD-10-CM

## 2020-09-07 DIAGNOSIS — R5383 Other fatigue: Secondary | ICD-10-CM

## 2020-09-07 NOTE — Progress Notes (Signed)
Patient ID: Elizabeth [name], female    DOB: 08/18/1966, 54 y.o.   MRN: [id number]  PCP: Towanda Malkin, MD  Chief Complaint  Patient presents with  . Blood sugar rising    patient states 07/25/20 she got the covid booster and flu shot, ever since then she has felt off and her blood sugars have beenin the 200's which abnormal for her, she has been dieting and excersing but BS will not come out of 200's  . Dizziness    Subjective:   Elizabeth [name] is a 54 y.o. female, presents to clinic with CC of the following:  Chief Complaint  Patient presents with  . Blood sugar rising    patient states 07/25/20 she got the covid booster and flu shot, ever since then she has felt off and her blood sugars have beenin the 200's which abnormal for her, she has been dieting and excersing but BS will not come out of 200's  . Dizziness    HPI:  Patient is a 54 year old female Last visit with me was 07/19/2020 Follows up today with the above complaint  She notes that on 07/25/2020 she got the Covid booster and the flu shot, and for the next 4 days, she stayed in bed as she felt flulike symptoms.  Her sugars were noted to be higher at that time.  She did improve, although since that time, her sugars have remained higher on checks in the morning, noting they have been in the low 200s routinely.  They had been in the 130 range previously.  She also notes that she has not returned to feeling like she had before those 2 vaccines were given, and notes more recently has been battling dizzy spells, notes often feels like things are spinning more than just being lightheaded, feeling more nauseous without vomiting, intermittent headaches, and just feels "off".  Denies any hearing loss or ringing in the ears.  She denied any fevers over this time, at times has some mucus drainage, although denied increased cough or congestion, no chest pains, shortness of breath, increased lower extremity swelling.  Denied any  diarrhea or abdominal pains, no dark or black stools, no bleeding per rectum.  Also denied any dysuria or flank pains.  She states she has been staying hydrated.  Denies constipation issues and her last bowel movement this morning was normal.  Denies increased thirst or frequency.  She states she has been avoiding foods higher in starches and sugars, and her sugars remain elevated.  She also feels more lethargic.  She has been working out more, trying to do 1 hour a day, and is getting through her workouts.  She states she occasionally feels dizzy with exertion, although denied any other symptoms with exertion including no chest pains, heart racing, or shortness of breath.  She has had no unintentional weight loss. She has been still receiving methotrexate shots and followed by rheumatology.  Her next appointment with them is in January 2022.   Diabetes Mellitus Type II: Medication regimen: Glucophage XR 500 mg-2 tabs daily Takes medicines regularly She is checking her blood sugars at home with results as noted above No hypoglycemic episodes      Lab Results  Component Value Date   HGBA1C 7.0 (A) 04/18/2020   HGBA1C 7.3 (H) 01/20/2020   HGBA1C 6.6 (H) 07/01/2019        Lab Results  Component Value Date   MICROALBUR 0.8 07/01/2019   [place] 62 01/20/2020  CREATININE 0.80 01/20/2020   Denies: Polyuria, increased thirst, vision changes, or increased numbness/tingling in her ext's- has unchanged neuropathy -currently managed with gabapentin  ACEI/ARB:no, allergy issuewith ACE in past noted Statin:Yes, atorvastatin    Hypertension: Medication regimen-none,  diet controlled. She does check her blood pressures at home, sometimes  BP Readings from Last 3 Encounters:  09/07/20 124/80  07/19/20 130/80  07/13/20 (!) 144/100    Denies chest pain,palpitations, SOB,increased LE swelling, vision changes  HLD- Medication regimen-atorvastatin 10 mg daily LDL goal  <70      Lab Results  Component Value Date   CHOL 139 01/20/2020   HDL 47 (L) 01/20/2020   LDLCALC 62 01/20/2020   TRIG 252 (H) 01/20/2020   CHOLHDL 3.0 01/20/2020    Bipolar: psychiatrist-Dr. Elige Ko every three months, still seeing presently Not takingZyprexa, Not take trazodone.On no medicines for this presently. Notes has been doing well, and denied any more depressive symptoms of concern in the recent past   Obesity  Has been exercising more in the recent past, despite her symptoms noted above.  Has not had success losing weight. No unintentional weight loss. Wt Readings from Last 3 Encounters:  09/07/20 251 lb 4.8 oz (114 kg)  07/19/20 248 lb 8 oz (112.7 kg)  07/13/20 246 lb (111.6 kg)      Patient Active Problem List   Diagnosis Date Noted  . Personal history of colonic polyps   . Polyp of sigmoid colon   . Chronic rhinitis 04/18/2020  . Chronic midline low back pain with bilateral sciatica 01/20/2020  . 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 Dyshidrotic eczema 01/07/2020  . Intraductal papilloma of breast, right 12/28/2019  . Psoriasis 08/06/2019  . Immunosuppressed status ([place]) 07/01/2019  . Morbid obesity ([place]) 07/01/2019  . Vaginal atrophy 01/01/2017  . Hyperlipidemia LDL goal <70 10/04/2016  . Diverticula of colon 01/24/2016  . Hypertension goal BP (blood pressure) < 130/80 01/17/2016  . Bilateral carpal tunnel syndrome 01/03/2016  . Bursitis, trochanteric 11/22/2015  . Inversion, nipple 11/08/2015  . Type 2 diabetes mellitus with hyperglycemia, without long-term current use of insulin ([place]) 10/23/2015  . Rheumatoid arthritis involving both hands ([place]) 10/23/2015  . Benign neoplasm of descending colon   . Benign neoplasm of sigmoid colon   . Bipolar disorder, current episode depressed, moderate ([place]) 07/03/2015  . IBS (irritable bowel syndrome) 07/03/2015  . Insomnia, uncontrolled 07/03/2015  . GERD without esophagitis  07/03/2015  . Bilateral hip bursitis 07/03/2015      Current Outpatient Medications:  .  Accu-Chek FastClix Lancets MISC, USE TO CHECK BLOOD SUGAR TWICE DAILY, Disp: 100 each, Rfl: 5 .  Accu-Chek Softclix Lancets lancets, Use as instructed, Disp: 100 each, Rfl: 12 .  aspirin 81 MG tablet, Take 81 mg by mouth daily., Disp: , Rfl:  .  atorvastatin (LIPITOR) 10 MG tablet, Take 1 tablet (10 mg total) by mouth at bedtime., Disp: 90 tablet, Rfl: 3 .  azelastine (ASTELIN) 0.1 % nasal spray, Place 2 sprays into both nostrils 2 (two) times daily. Use in each nostril as directed, Disp: 30 mL, Rfl: 12 .  B-D INS SYR ULTRAFINE 1CC/31G 31G X 5/16" 1 ML MISC, use as directed TO INJECT METHOTREXATE, Disp: , Rfl: 1 .  Blood Glucose Monitoring Suppl (ACCU-CHEK NANO SMARTVIEW) w/Device KIT, Use as directed once daily. LON 99, E11.9, Disp: 1 kit, Rfl: 0 .  cholecalciferol (VITAMIN D) 1000 units tablet, Take 2,000 Units by mouth daily. , Disp: ,  Rfl:  .  clobetasol ointment (TEMOVATE) 0.05 %, APPLY EXTERNALLY TO THE AFFECTED AREA TWICE DAILY, Disp: , Rfl:  .  folic acid (FOLVITE) 1 MG tablet, Take 1 mg by mouth daily., Disp: , Rfl:  .  gabapentin (NEURONTIN) 300 MG capsule, TAKE 1 CAPSULE BY MOUTH EVERY MORNING TAKE 1 CAPSULE AT NOON AND TAKE 2 CAPSULAS AT BEDTIME, Disp: 360 capsule, Rfl: 1 .  glucose blood (ACCU-CHEK GUIDE) test strip, AS DIRECTED, Disp: 50 strip, Rfl: 5 .  glucose blood (ACCU-CHEK SMARTVIEW) test strip, USE TO TEST ONCE DAILY, Disp: 100 strip, Rfl: 6 .  LINZESS 290 MCG CAPS capsule, TAKE 1 CAPSULE(290 MCG) BY MOUTH DAILY, Disp: 90 capsule, Rfl: 1 .  metFORMIN (GLUCOPHAGE-XR) 500 MG 24 hr tablet, Take 1 tablet (500 mg total) by mouth 2 (two) times daily., Disp: 180 tablet, Rfl: 1 .  Methotrexate Sodium (METHOTREXATE, PF,) 50 MG/2ML injection, 15 mg once a week., Disp: , Rfl:  .  Multiple Vitamins-Minerals (MULTIVITAMIN/EXTRA VITAMIN D3 PO), Take by mouth., Disp: , Rfl:  .  Omega-3 Fatty Acids  (FISH OIL PO), Take by mouth., Disp: , Rfl:  .  OVER THE COUNTER MEDICATION, once daily Herbal Name: Goli, Disp: , Rfl:  .  Turmeric 500 MG CAPS, Take by mouth., Disp: , Rfl:    Allergies  Allergen Reactions  . Ace Inhibitors Swelling    Angioedema (specifically from enalapril)  . Enalapril Swelling    angioedema  . Penicillins Hives and Swelling    Has patient had a PCN reaction causing immediate rash, facial/tongue/throat swelling, SOB or lightheadedness with hypotension: Yes Has patient had a PCN reaction causing severe rash involving mucus membranes or skin necrosis: No Has patient had a PCN reaction that required hospitalization Yes Has patient had a PCN reaction occurring within the last 10 years: No If all of the above answers are "NO", then may proceed with Cephalosporin use.  . Statins Hives     Past Surgical History:  Procedure Laterality Date  . BOWEL RESECTION    . BREAST BIOPSY Right 12/22/2019   affirm bx x marker, path pending  . BREAST BIOPSY    . CHOLECYSTECTOMY  1997   [place], [place]  . COLONOSCOPY WITH PROPOFOL N/A 09/07/2015   Procedure: COLONOSCOPY WITH PROPOFOL;  Surgeon: Lucilla Lame, MD;  Location: [facility];  Service: Endoscopy;  Laterality: N/A;  . COLONOSCOPY WITH PROPOFOL N/A 05/16/2020   Procedure: COLONOSCOPY WITH PROPOFOL;  Surgeon: Lucilla Lame, MD;  Location: [facility] ENDOSCOPY;  Service: Endoscopy;  Laterality: N/A;  . ESOPHAGOGASTRODUODENOSCOPY (EGD) WITH ESOPHAGEAL DILATION    . ESOPHAGOGASTRODUODENOSCOPY (EGD) WITH PROPOFOL N/A 09/07/2015   Procedure: ESOPHAGOGASTRODUODENOSCOPY (EGD) WITH PROPOFOL;  Surgeon: Lucilla Lame, MD;  Location: [facility];  Service: Endoscopy;  Laterality: N/A;  . PARTIAL COLECTOMY N/A 01/24/2016   Procedure: SIGMOID COLECTOMY;  Surgeon: Florene Glen, MD;  Location: [facility];  Service: General;  Laterality: N/A;  . POLYPECTOMY N/A 09/07/2015   Procedure: POLYPECTOMY;  Surgeon: Lucilla Lame, MD;  Location:  [facility];  Service: Endoscopy;  Laterality: N/A;  . TUBAL LIGATION       Family History  Problem Relation Age of Onset  . Hypertension Mother   . Diabetes Mother   . Arthritis/Rheumatoid Mother   . Hyperlipidemia Mother   . Alcohol abuse Father   . Esophageal varices Father   . Diverticulosis Maternal Aunt   . Diverticulosis Maternal Grandmother   . Diabetes Maternal Grandmother   . Heart  disease Maternal Grandmother   . Dementia Maternal Grandmother   . Diabetes Brother   . Kidney disease Brother   . Aneurysm Maternal Grandfather   . Diabetes Paternal Grandmother   . Healthy Brother   . Depression Daughter        bipolar disorder  . Depression Daughter        bipolar disorder  . Breast cancer Neg Hx      Social History   Tobacco Use  . Smoking status: Former Smoker    Packs/day: 1.00    Years: 1.00    Pack years: 1.00    Types: Cigarettes    Quit date: 05/06/2015    Years since quitting: 5.3  . Smokeless tobacco: Never Used  . Tobacco comment: smoking cessation materials not required  Substance Use Topics  . Alcohol use: Yes    Alcohol/week: 0.0 standard drinks    Comment: rarely small glass of wine    With staff assistance, above reviewed with the patient today.  ROS: As per HPI, otherwise no specific complaints on a limited and focused system review   No results found for this or any previous visit (from the past 72 hour(s)).   PHQ2/9: Depression screen [facility] 2/9 09/07/2020 07/19/2020 04/18/2020 03/31/2020 01/20/2020  Decreased Interest 0 0 0 0 0  Down, Depressed, Hopeless 0 1 0 0 0  PHQ - 2 Score 0 1 0 0 0  Altered sleeping - 1 0 - 3  Tired, decreased energy - 0 0 - 1  Change in appetite - 0 0 - 1  Feeling bad or failure about yourself  - 0 0 - 0  Trouble concentrating - 1 0 - 3  Moving slowly or fidgety/restless - 0 0 - 0  Suicidal thoughts - 0 0 - 0  PHQ-9 Score - 3 0 - 8  Difficult doing work/chores - Not difficult at all Not difficult at  all - Not difficult at all  Some recent data might be hidden   PHQ-2/9 Result is neg  Fall Risk: Fall Risk  09/07/2020 07/19/2020 07/13/2020 04/18/2020 03/31/2020  Falls in the past year? 0 0 0 0 0  Comment - - - - -  Number falls in past yr: 0 0 0 0 0  Injury with Fall? 0 0 0 0 0  Comment - - - - -  Risk for fall due to : - - - - No Fall Risks  Risk for fall due to: Comment - - - - -  Follow up Falls evaluation completed - - - Falls prevention discussed      Objective:   Vitals:   09/07/20 0942  BP: 124/80  Pulse: 84  Resp: 16  Temp: (!) 97.5 F (36.4 C)  TempSrc: Oral  SpO2: 98%  Weight: 251 lb 4.8 oz (114 kg)  Height: '5\' 6"'  (1.676 m)    Body mass index is 40.56 kg/m.  Physical Exam   NAD, masked,  HEENT - [place]/AT, sclera anicteric, PERRL, EOMI, conj - non-inj'ed, pharynx clear Neck - supple, no adenopathy, no TM, carotids 2+ and = without bruits bilat Car - RRR without m/g/r Pulm- RR and effort normal at rest, CTA without wheeze or rales Abd - soft, obese, tender palpating in the periumbilical region and towards the epigastric area, denied marked tenderness palpating in the left and right upper quadrants, also denied lower quadrant tenderness, no rebound or guarding, ND, BS+,  no obvious masses Back - no CVA tenderness Skin- no  rash noted on exposed areas Ext - no LE edema,  Neuro/psychiatric - affect was not flat, appropriate with conversation  Alert and oriented  Grossly non-focal - good strength on testing extremities, sensation intact to LT in distal extremities  Speech normal   After she noted the discomfort when palpating her abdomen, she felt nauseous, sat up and then stood up and felt some dizziness, and sat in her chair, and symptoms then resolved in a couple minutes time.  Results for orders placed or performed in visit on 62/56/38  BASIC METABOLIC PANEL WITH GFR  Result Value Ref Range   Glucose, Bld 192 (H) 65 - 99 mg/dL   BUN 10 7 - 25 mg/dL   Creat  0.78 0.50 - 1.05 mg/dL   GFR, Est Non African American 87 > OR = 60 mL/min/1.33m   GFR, Est African American 101 > OR = 60 mL/min/1.739m  BUN/Creatinine Ratio NOT APPLICABLE 6 - 22 (calc)   Sodium 139 135 - 146 mmol/L   Potassium 4.4 3.5 - 5.3 mmol/L   Chloride 102 98 - 110 mmol/L   CO2 31 20 - 32 mmol/L   Calcium 10.4 8.6 - 10.4 mg/dL  Microalbumin / creatinine urine ratio  Result Value Ref Range   Creatinine, Urine 162 20 - 275 mg/dL   Microalb, Ur 1.5 mg/dL   Microalb Creat Ratio 9 <30 mcg/mg creat  Lipid panel  Result Value Ref Range   Cholesterol 134 <200 mg/dL   HDL 47 (L) > OR = 50 mg/dL   Triglycerides 133 <150 mg/dL   LDL Cholesterol (Calc) 66 mg/dL (calc)   Total CHOL/HDL Ratio 2.9 <5.0 (calc)   Non-HDL Cholesterol (Calc) 87 <130 mg/dL (calc)       Assessment & Plan:    1. Periumbilical abdominal pain She denied any abdominal pains in the recent past, although on exam, was tender in the periumbilical region.  Denied any recent fevers, diarrhea, no change in bowel habits, with a normal bowel movement this morning.  No black or dark stools or bleeding per rectum noted. Exact source unclear, and felt best checking some labs to help further assess.  Doubt a cholecystitis/hepatitis concern with no focal right upper quadrant discomfort.  Possible acid source noted.  We will also check a lipase to help rule out pancreatitis concern - COMPLETE METABOLIC PANEL WITH GFR - CBC with Differential/Platelet - Lipase - Urinalysis, Complete  2. Nausea 3. Dizzy spells Unclear of the exact source, and she notes she has been exercising a lot more, and trying to stay well-hydrated.  Question if a vertigo component more than lightheadedness as the source.  Her sugars have been running higher in the recent past, and unclear if poor control of her diabetes a factor. We will check some labs presently including a thyroid panel. Has been doing more exertional activities and denies any chest  pains, or cardiac concerns, although will have a low yield to get an ECG on her next follow-up and await the results of the test ordered today. Felt best not to add a medicine as needed for potential vertigo presently, as can be sedating and felt best to not have that complicate the picture presently - COMPLETE METABOLIC PANEL WITH GFR - CBC with Differential/Platelet - Thyroid Panel With TSH  4. Type 2 diabetes mellitus with hyperglycemia, without long-term current use of insulin (HCC) Her blood sugars have been a little higher recently, she states after she received the 2 vaccinations.  Unclear  if that is the source as we discussed.  Seems unlikely. We will check an A1c today as well as a metabolic panel. Await those results presently. - Hemoglobin A1c - COMPLETE METABOLIC PANEL WITH GFR  5. Hypertension goal BP (blood pressure) < 130/80 Her blood pressure remains controlled  6. Morbid obesity ([place]) Has not had success losing weight, and has had no unintentional weight loss. She states she has been exercising more to try to help.  7. Rheumatoid arthritis involving both hands with positive rheumatoid factor ([place]) Still followed by rheumatology and has been on methotrexate for some time now.  (Is not new)  - Thyroid Panel With TSH  8. Fatigue, unspecified type As felt more fatigued in the recent past as well, and await lab results to help assess. - COMPLETE METABOLIC PANEL WITH GFR - Thyroid Panel With TSH  Await lab results presently, emphasized staying well-hydrated, can stay active, but not pushing heavier exertional activities presently, and felt best to schedule follow-up early next week to reassess. Discussed if symptoms significantly worsening, the importance of getting to an emergency setting to be assessed and she was understanding of that. Can follow-up sooner as needed.     Towanda Malkin, MD 09/07/20 9:49 AM

## 2020-09-08 ENCOUNTER — Telehealth: Payer: Self-pay

## 2020-09-08 ENCOUNTER — Encounter: Payer: Self-pay | Admitting: Internal Medicine

## 2020-09-08 LAB — CBC WITH DIFFERENTIAL/PLATELET
Absolute Monocytes: 322 cells/uL (ref 200–950)
Basophils Absolute: 29 cells/uL (ref 0–200)
Basophils Relative: 0.6 %
Eosinophils Absolute: 82 cells/uL (ref 15–500)
Eosinophils Relative: 1.7 %
HCT: 37.2 % (ref 35.0–45.0)
Hemoglobin: 12.3 g/dL (ref 11.7–15.5)
Lymphs Abs: 2050 cells/uL (ref 850–3900)
MCH: 30.9 pg (ref 27.0–33.0)
MCHC: 33.1 g/dL (ref 32.0–36.0)
MCV: 93.5 fL (ref 80.0–100.0)
MPV: 10.4 fL (ref 7.5–12.5)
Monocytes Relative: 6.7 %
Neutro Abs: 2318 cells/uL (ref 1500–7800)
Neutrophils Relative %: 48.3 %
Platelets: 268 10*3/uL (ref 140–400)
RBC: 3.98 10*6/uL (ref 3.80–5.10)
RDW: 13.4 % (ref 11.0–15.0)
Total Lymphocyte: 42.7 %
WBC: 4.8 10*3/uL (ref 3.8–10.8)

## 2020-09-08 LAB — URINALYSIS, COMPLETE
Bacteria, UA: NONE SEEN /HPF
Bilirubin Urine: NEGATIVE
Hgb urine dipstick: NEGATIVE
Hyaline Cast: NONE SEEN /LPF
Ketones, ur: NEGATIVE
Nitrite: NEGATIVE
Protein, ur: NEGATIVE
Specific Gravity, Urine: 1.02 (ref 1.001–1.03)
Squamous Epithelial / HPF: NONE SEEN /HPF (ref <=5)
pH: <=5 (ref 5.0–8.0)

## 2020-09-08 LAB — THYROID PANEL WITH TSH
Free Thyroxine Index: 2.4 (ref 1.4–3.8)
T3 Uptake: 28 % (ref 22–35)
T4, Total: 8.6 ug/dL (ref 5.1–11.9)
TSH: 1.11 mIU/L

## 2020-09-08 LAB — COMPLETE METABOLIC PANEL WITH GFR
AG Ratio: 1.5 (calc) (ref 1.0–2.5)
ALT: 40 U/L — ABNORMAL HIGH (ref 6–29)
AST: 22 U/L (ref 10–35)
Albumin: 4.3 g/dL (ref 3.6–5.1)
Alkaline phosphatase (APISO): 90 U/L (ref 37–153)
BUN: 13 mg/dL (ref 7–25)
CO2: 28 mmol/L (ref 20–32)
Calcium: 9.9 mg/dL (ref 8.6–10.4)
Chloride: 105 mmol/L (ref 98–110)
Creat: 0.81 mg/dL (ref 0.50–1.05)
GFR, Est African American: 96 mL/min/{1.73_m2} (ref >=60)
GFR, Est Non African American: 83 mL/min/{1.73_m2} (ref >=60)
Globulin: 2.9 g/dL (calc) (ref 1.9–3.7)
Glucose, Bld: 214 mg/dL — ABNORMAL HIGH (ref 65–99)
Potassium: 4.5 mmol/L (ref 3.5–5.3)
Sodium: 140 mmol/L (ref 135–146)
Total Bilirubin: 0.6 mg/dL (ref 0.2–1.2)
Total Protein: 7.2 g/dL (ref 6.1–8.1)

## 2020-09-08 LAB — HEMOGLOBIN A1C
Hgb A1c MFr Bld: 8.7 % of total Hgb — ABNORMAL HIGH (ref <5.7)
Mean Plasma Glucose: 203 (calc)
eAG (mmol/L): 11.2 (calc)

## 2020-09-08 LAB — LIPASE: Lipase: 13 U/L (ref 7–60)

## 2020-09-08 NOTE — Telephone Encounter (Signed)
Copied from [place] [id number]. Topic: General - Other >> Sep 08, 2020  8:10 AM Celene Kras wrote: Reason for CRM: Pt called and is requesting to have PCP nurse give her a call back. PT would not leave details. Please advise.

## 2020-09-11 ENCOUNTER — Other Ambulatory Visit: Payer: Self-pay | Admitting: Internal Medicine

## 2020-09-11 DIAGNOSIS — M5440 Lumbago with sciatica, unspecified side: Secondary | ICD-10-CM

## 2020-09-11 NOTE — Progress Notes (Signed)
Patient ID: Elizabeth [name], female    DOB: 1966-12-06, 54 y.o.   MRN: [id number]  PCP: Towanda Malkin, MD  Chief Complaint  Patient presents with   Abdominal Pain    5 day follow up for periumbilical pain   Nausea   Dizziness    Subjective:   [name] is a 54 y.o. female, presents to clinic with CC of the following:  Chief Complaint  Patient presents with   Abdominal Pain    5 day follow up for periumbilical pain   Nausea   Dizziness    HPI:  Patient is a 54 y.o. female who follos up today after I saw last week with complaints of dizzy spells, nausea and her sugars being less well controlled in the recent past (after vaccinations obtained). She also had periumbilical pain noted on exam. Communication from labs obtained at that visit included:  The complete metabolic panel showed a slightly elevated glucose at 214, and was otherwise unremarkable. (1 liver test, the ALT was just slightly above the normal range, with all other liver tests normal and not a major concern presently) The hemoglobin A1c was elevated at 8.7. This is consistent with your sugars not being as well controlled, and increasing the Glucophage XR dose to 2 tablets in the morning and 2 tablets in the evening is recommended. The complete blood count was entirely normal. The thyroid panel with a TSH was normal. The lipase was normal. The urinalysis showed trace glucose, otherwise unremarkable. I do think your sugars being less well-controlled may be contributing to your symptoms, and increasing the medicine as above is appropriate. We will keep the planned follow-up to reassess.  Follows up today, and notes she feels much improved.  She has no further nausea, no dizziness, and notes her abdominal pains are better.  She has increased the Glucophage, and her sugars are better controlled now at home.  She has had no further newer symptoms arise including no chest pains, palpitations,  shortness of breath, increased lower extremity swelling, no dark or black stools, no bleeding per rectum, no change in bowel habits.  Also denies flank pains or urinary symptoms of concern. She does note she gets some lower back pains as the day progresses, and often relieved after she has a bowel movement. She continues to take Linzess to help with constipation issues, and has a history of diverticulitis, and tries to follow diet recommendations in this regard, although has been eating more watermelon recently and sometimes seeds from the watermelon as well, and recommended avoiding those.   Diabetes Mellitus Type II: Medication regimen:Glucophage XR 500 mg increased to 2 tabs bid after last visit Takes medicines regularly She is checking her blood sugars at home, sugars 130's fasting in amin recent past No hypoglycemic episodes Lab Results  Component Value Date   HGBA1C 8.7 (H) 09/07/2020   HGBA1C 7.0 (A) 04/18/2020   HGBA1C 7.3 (H) 01/20/2020   Lab Results  Component Value Date   MICROALBUR 1.5 07/19/2020   LDLCALC 66 07/19/2020   CREATININE 0.81 09/07/2020    Denies: Polyuria, increased thirst, vision changes, has unchanged neuropathy -currently managed with gabapentin  ACEI/ARB:no, allergy issuewith ACE in past noted Statin:Yes, atorvastatin    Hypertension: Medication regimen-none,diet controlled. She does not check her blood pressures at home recently  BP Readings from Last 3 Encounters:  09/12/20 120/86  09/07/20 124/80  07/19/20 130/80    Denies chest pain,palpitations,SOB,increased LE swelling,vision changes  HLD- Medication regimen-atorvastatin 10 mg daily She asked about stopping after last visit and encouraged to continue (not felt source of her recent sx's) LDL goal <70      Lab Results  Component Value Date   CHOL 139 01/20/2020   HDL 47 (L) 01/20/2020   LDLCALC 62 01/20/2020   TRIG 252 (H) 01/20/2020   CHOLHDL 3.0  01/20/2020    Bipolar: psychiatrist-Dr. Elige Ko every three months,still seeing presently Not takingZyprexa, Not take trazodone.On no medicines for this presently. Notes has been doing well, and denied any more depressive symptoms of concern in the recent past  Obesity  Has continued to exercise more in the recent past, although has not had success losing weight. No unintentional weight loss. Wt Readings from Last 3 Encounters:  09/12/20 252 lb 9.6 oz (114.6 kg)  09/07/20 251 lb 4.8 oz (114 kg)  07/19/20 248 lb 8 oz (112.7 kg)      Patient Active Problem List   Diagnosis Date Noted   Personal history of colonic polyps    Polyp of sigmoid colon    Chronic rhinitis 04/18/2020   Chronic midline low back pain with bilateral sciatica 01/20/2020   Pustular psoriasis of palms and soles 01/07/2020   Dyshidrotic eczema 01/07/2020   Intraductal papilloma of breast, right 12/28/2019   Psoriasis 08/06/2019   Immunosuppressed status ([place]) 07/01/2019   Morbid obesity ([place]) 07/01/2019   Vaginal atrophy 01/01/2017   Hyperlipidemia LDL goal <70 10/04/2016   Diverticula of colon 01/24/2016   Hypertension goal BP (blood pressure) < 130/80 01/17/2016   Bilateral carpal tunnel syndrome 01/03/2016   Bursitis, trochanteric 11/22/2015   Inversion, nipple 11/08/2015   Type 2 diabetes mellitus with hyperglycemia, without long-term current use of insulin ([place]) 10/23/2015   Rheumatoid arthritis involving both hands ([place]) 10/23/2015   Benign neoplasm of descending colon    Benign neoplasm of sigmoid colon    Bipolar disorder, current episode depressed, moderate ([place]) 07/03/2015   IBS (irritable bowel syndrome) 07/03/2015   Insomnia, uncontrolled 07/03/2015   GERD without esophagitis 07/03/2015   Bilateral hip bursitis 07/03/2015      Current Outpatient Medications:    Accu-Chek FastClix Lancets MISC, USE TO CHECK BLOOD SUGAR TWICE DAILY, Disp: 100  each, Rfl: 5   Accu-Chek Softclix Lancets lancets, Use as instructed, Disp: 100 each, Rfl: 12   aspirin 81 MG tablet, Take 81 mg by mouth daily., Disp: , Rfl:    atorvastatin (LIPITOR) 10 MG tablet, Take 1 tablet (10 mg total) by mouth at bedtime., Disp: 90 tablet, Rfl: 3   azelastine (ASTELIN) 0.1 % nasal spray, Place 2 sprays into both nostrils 2 (two) times daily. Use in each nostril as directed, Disp: 30 mL, Rfl: 12   B-D INS SYR ULTRAFINE 1CC/31G 31G X 5/16" 1 ML MISC, use as directed TO INJECT METHOTREXATE, Disp: , Rfl: 1   Blood Glucose Monitoring Suppl (ACCU-CHEK NANO SMARTVIEW) w/Device KIT, Use as directed once daily. LON 99, E11.9, Disp: 1 kit, Rfl: 0   cholecalciferol (VITAMIN D) 1000 units tablet, Take 2,000 Units by mouth daily. , Disp: , Rfl:    clobetasol ointment (TEMOVATE) 0.05 %, APPLY EXTERNALLY TO THE AFFECTED AREA TWICE DAILY, Disp: , Rfl:    folic acid (FOLVITE) 1 MG tablet, Take 1 mg by mouth daily., Disp: , Rfl:    gabapentin (NEURONTIN) 300 MG capsule, TAKE 1 CAPSULE BY MOUTH EVERY MORNING AND TAKE 1 AT NOON AND TAKE 2 CAPSULES AT BEDTIME, Disp: 360 capsule,  Rfl: 1   glucose blood (ACCU-CHEK GUIDE) test strip, AS DIRECTED, Disp: 50 strip, Rfl: 5   glucose blood (ACCU-CHEK SMARTVIEW) test strip, USE TO TEST ONCE DAILY, Disp: 100 strip, Rfl: 6   LINZESS 290 MCG CAPS capsule, TAKE 1 CAPSULE(290 MCG) BY MOUTH DAILY, Disp: 90 capsule, Rfl: 1   metFORMIN (GLUCOPHAGE-XR) 500 MG 24 hr tablet, Take 1 tablet (500 mg total) by mouth 2 (two) times daily., Disp: 180 tablet, Rfl: 1   Methotrexate 25 MG/ML SOSY, INJECT 0.6 MILLILITERS SUBCUTANEOUSLY EVERY WEEK FOR 12 WEEKS, Disp: , Rfl:    Methotrexate Sodium (METHOTREXATE, PF,) 50 MG/2ML injection, 15 mg once a week., Disp: , Rfl:    Multiple Vitamins-Minerals (MULTIVITAMIN/EXTRA VITAMIN D3 PO), Take by mouth., Disp: , Rfl:    Multiple Vitamins-Minerals (ZINC PO), Take by mouth., Disp: , Rfl:    NASAL ALLERGY 24 HOUR  55 MCG/ACT AERO nasal inhaler, 2 sprays daily., Disp: , Rfl:    OLANZapine (ZYPREXA) 15 MG tablet, Take 15 mg by mouth at bedtime., Disp: , Rfl:    Omega-3 Fatty Acids (FISH OIL PO), Take by mouth., Disp: , Rfl:    OVER THE COUNTER MEDICATION, once daily Herbal Name: Goli, Disp: , Rfl:    SALINE NASAL SPRAY NA, SMARTSIG:2 Spray(s) Both Nares Daily, Disp: , Rfl:    traZODone (DESYREL) 100 MG tablet, Take 200 mg by mouth at bedtime as needed., Disp: , Rfl:    Turmeric 500 MG CAPS, Take by mouth., Disp: , Rfl:    XIIDRA 5 % SOLN, Place 1 drop into both eyes 2 (two) times daily., Disp: , Rfl:    Allergies  Allergen Reactions   Ace Inhibitors Swelling    Angioedema (specifically from enalapril)   Enalapril Swelling    angioedema   Penicillins Hives and Swelling    Has patient had a PCN reaction causing immediate rash, facial/tongue/throat swelling, SOB or lightheadedness with hypotension: Yes Has patient had a PCN reaction causing severe rash involving mucus membranes or skin necrosis: No Has patient had a PCN reaction that required hospitalization Yes Has patient had a PCN reaction occurring within the last 10 years: No If all of the above answers are "NO", then may proceed with Cephalosporin use.   Statins Hives     Past Surgical History:  Procedure Laterality Date   BOWEL RESECTION     BREAST BIOPSY Right 12/22/2019   affirm bx x marker, path pending   BREAST BIOPSY     CHOLECYSTECTOMY  1997   [place],    COLONOSCOPY WITH PROPOFOL N/A 09/07/2015   Procedure: COLONOSCOPY WITH PROPOFOL;  Surgeon: Lucilla Lame, MD;  Location: [facility];  Service: Endoscopy;  Laterality: N/A;   COLONOSCOPY WITH PROPOFOL N/A 05/16/2020   Procedure: COLONOSCOPY WITH PROPOFOL;  Surgeon: Lucilla Lame, MD;  Location[facility] ENDOSCOPY;  Service: Endoscopy;  Laterality: N/A;   ESOPHAGOGASTRODUODENOSCOPY (EGD) WITH ESOPHAGEAL DILATION     ESOPHAGOGASTRODUODENOSCOPY (EGD) WITH  PROPOFOL N/A 09/07/2015   Procedure: ESOPHAGOGASTRODUODENOSCOPY (EGD) WITH PROPOFOL;  Surgeon: Lucilla Lame, MD;  Location: [place];  Service: Endoscopy;  Laterality: N/A;   PARTIAL COLECTOMY N/A 01/24/2016   Procedure: SIGMOID COLECTOMY;  Surgeon: Florene Glen, MD;  Location: [facility];  Service: General;  Laterality: N/A;   POLYPECTOMY N/A 09/07/2015   Procedure: POLYPECTOMY;  Surgeon: Lucilla Lame, MD;  Location: [place];  Service: Endoscopy;  Laterality: N/A;   TUBAL LIGATION       Family History  Problem Relation  Age of Onset   Hypertension Mother    Diabetes Mother    Arthritis/Rheumatoid Mother    Hyperlipidemia Mother    Alcohol abuse Father    Esophageal varices Father    Diverticulosis Maternal Aunt    Diverticulosis Maternal Grandmother    Diabetes Maternal Grandmother    Heart disease Maternal Grandmother    Dementia Maternal Grandmother    Diabetes Brother    Kidney disease Brother    Aneurysm Maternal Grandfather    Diabetes Paternal Grandmother    Healthy Brother    Depression Daughter        bipolar disorder   Depression Daughter        bipolar disorder   Breast cancer Neg Hx      Social History   Tobacco Use   Smoking status: Former Smoker    Packs/day: 1.00    Years: 1.00    Pack years: 1.00    Types: Cigarettes    Quit date: 05/06/2015    Years since quitting: 5.3   Smokeless tobacco: Never Used   Tobacco comment: smoking cessation materials not required  Substance Use Topics   Alcohol use: Yes    Alcohol/week: 0.0 standard drinks    Comment: rarely small glass of wine    With staff assistance, above reviewed with the patient today.  ROS: As per HPI, otherwise no specific complaints on a limited and focused system review   No results found for this or any previous visit (from the past 72 hour(s)).   PHQ2/9: Depression screen [facility] 2/9 09/12/2020 09/07/2020 07/19/2020 04/18/2020 03/31/2020    Decreased Interest 0 0 0 0 0  Down, Depressed, Hopeless 0 0 1 0 0  PHQ - 2 Score 0 0 1 0 0  Altered sleeping - - 1 0 -  Tired, decreased energy - - 0 0 -  Change in appetite - - 0 0 -  Feeling bad or failure about yourself  - - 0 0 -  Trouble concentrating - - 1 0 -  Moving slowly or fidgety/restless - - 0 0 -  Suicidal thoughts - - 0 0 -  PHQ-9 Score - - 3 0 -  Difficult doing work/chores - - Not difficult at all Not difficult at all -  Some recent data might be hidden   PHQ-2/9 Result is neg Fall Risk: Fall Risk  09/12/2020 09/07/2020 07/19/2020 07/13/2020 04/18/2020  Falls in the past year? 0 0 0 0 0  Comment - - - - -  Number falls in past yr: 0 0 0 0 0  Injury with Fall? 0 0 0 0 0  Comment - - - - -  Risk for fall due to : - - - - -  Risk for fall due to: Comment - - - - -  Follow up - Falls evaluation completed - - -      Objective:   Vitals:   09/12/20 0908  BP: 140/78  Pulse: (!) 101  Resp: 16  Temp: 97.6 F (36.4 C)  TempSrc: Oral  SpO2: 99%  Weight: 252 lb 9.6 oz (114.6 kg)  Height: '5\' 6"'  (1.676 m)    Body mass index is 40.77 kg/m.  Physical Exam   NAD, masked,  pleasant HEENT - [place]/AT, sclera anicteric, PERRL, EOMI, conj - non-inj'ed,  Neck - supple, no adenopathy, no rigidity Car - RRR without m/g/r, heart rate was borderline tachycardic with a pulse in the 90s and regular on my exam Pulm- RR  and effort normal at rest, CTA without wheeze or rales Abd - soft, obese, mildly tender palpating in the periumbilical region and towards the epigastric area and much improved from last visit, nontender elsewhere, no rebound or guarding, ND, BS+,  no obvious masses Back - no CVA tenderness Ext - no LE edema, she noted some depigmentation areas on her foot where she had a rash previously, with her showing me on the left foot the area of pigmentation on the medial aspect, no erythema or open lesions noted Neuro/psychiatric - affect was not flat, appropriate with  conversation             Alert and oriented             Grossly non-focal              Speech normal   Results for orders placed or performed in visit on 09/07/20  Hemoglobin A1c  Result Value Ref Range   Hgb A1c MFr Bld 8.7 (H) <5.7 % of total Hgb   Mean Plasma Glucose 203 (calc)   eAG (mmol/L) 11.2 (calc)  COMPLETE METABOLIC PANEL WITH GFR  Result Value Ref Range   Glucose, Bld 214 (H) 65 - 99 mg/dL   BUN 13 7 - 25 mg/dL   Creat 0.81 0.50 - 1.05 mg/dL   GFR, Est Non African American 83 > OR = 60 mL/min/1.83m   GFR, Est African American 96 > OR = 60 mL/min/1.736m  BUN/Creatinine Ratio NOT APPLICABLE 6 - 22 (calc)   Sodium 140 135 - 146 mmol/L   Potassium 4.5 3.5 - 5.3 mmol/L   Chloride 105 98 - 110 mmol/L   CO2 28 20 - 32 mmol/L   Calcium 9.9 8.6 - 10.4 mg/dL   Total Protein 7.2 6.1 - 8.1 g/dL   Albumin 4.3 3.6 - 5.1 g/dL   Globulin 2.9 1.9 - 3.7 g/dL (calc)   AG Ratio 1.5 1.0 - 2.5 (calc)   Total Bilirubin 0.6 0.2 - 1.2 mg/dL   Alkaline phosphatase (APISO) 90 37 - 153 U/L   AST 22 10 - 35 U/L   ALT 40 (H) 6 - 29 U/L  CBC with Differential/Platelet  Result Value Ref Range   WBC 4.8 3.8 - 10.8 Thousand/uL   RBC 3.98 3.80 - 5.10 Million/uL   Hemoglobin 12.3 11.7 - 15.5 g/dL   HCT 37.2 35 - 45 %   MCV 93.5 80.0 - 100.0 fL   MCH 30.9 27.0 - 33.0 pg   MCHC 33.1 32.0 - 36.0 g/dL   RDW 13.4 11.0 - 15.0 %   Platelets 268 140 - 400 Thousand/uL   MPV 10.4 7.5 - 12.5 fL   Neutro Abs 2,318 1,500 - 7,800 cells/uL   Lymphs Abs 2,050 850 - 3,900 cells/uL   Absolute Monocytes 322 200 - 950 cells/uL   Eosinophils Absolute 82 15 - 500 cells/uL   Basophils Absolute 29 0 - 200 cells/uL   Neutrophils Relative % 48.3 %   Total Lymphocyte 42.7 %   Monocytes Relative 6.7 %   Eosinophils Relative 1.7 %   Basophils Relative 0.6 %  Thyroid Panel With TSH  Result Value Ref Range   T3 Uptake 28 22 - 35 %   T4, Total 8.6 5.1 - 11.9 mcg/dL   Free Thyroxine Index 2.4 1.4 - 3.8   TSH  1.11 mIU/L  Lipase  Result Value Ref Range   Lipase 13 7 - 60 U/L  Urinalysis, Complete  Result Value Ref Range   Color, Urine YELLOW YELLOW   APPearance CLEAR CLEAR   Specific Gravity, Urine 1.020 1.001 - 1.03   pH < OR = 5.0 5.0 - 8.0   Glucose, UA TRACE (A) NEGATIVE   Bilirubin Urine NEGATIVE NEGATIVE   Ketones, ur NEGATIVE NEGATIVE   Hgb urine dipstick NEGATIVE NEGATIVE   Protein, ur NEGATIVE NEGATIVE   Nitrite NEGATIVE NEGATIVE   Leukocytes,Ua 1+ (A) NEGATIVE   WBC, UA 0-5 0 - 5 /HPF   RBC / HPF 0-2 0 - 2 /HPF   Squamous Epithelial / LPF NONE SEEN < OR = 5 /HPF   Bacteria, UA NONE SEEN NONE SEEN /HPF   Hyaline Cast NONE SEEN NONE SEEN /LPF   Last labs reviewed Assessment & Plan:    1. Periumbilical abdominal pain Has improved, and will continue to monitor. Initial lab work-up including a lipase was unremarkable.  2. Nausea 3. Dizzy spells She noted today the nausea and dizzy spells have resolved.  Do question if they were related to her sugars being less well controlled, as symptoms have resolved since gaining better control.  4. Type 2 diabetes mellitus with hyperglycemia, without long-term current use of insulin ([facility]) Noted the higher A1c on last check, and she has increased the Metformin to 2 tabs twice daily. Fasting blood sugars on checks at home have been much improved, in the 130s (were in the 200s previously) We will continue the increased Metformin dose presently Also continue with checks at home, and can do every other day presently, and if remaining improved, recommended a check at least a couple times a week. - metFORMIN (GLUCOPHAGE-XR) 500 MG 24 hr tablet; Take 2 tablets (1,000 mg total) by mouth 2 (two) times daily.  Dispense: 360 tablet; Refill: 1  5. Hypertension goal BP (blood pressure) < 130/80 Blood pressure remains well controlled.  6. Morbid obesity ([place]) She is trying to be more active, and encouraged dietary modifications in combination and  the importance of that noting her high triglyceride levels to help with attempts at weight loss.  7. Rheumatoid arthritis involving both hands with positive rheumatoid factor ([place]) Still followed by rheumatology, on methotrexate  8. Hyperlipidemia LDL goal <70 On a statin, and discussed why I think it is best to continue that presently. Reviewed her last lipid panel with her triglycerides high, and her HDL lower than desired. Noted the HDL is also low when the triglycerides are high due to a receptor phenomenon. Strongly encouraged more dietary modifications and information provided in the AVS to help. Discussed a medicine like Vascepa which is often added to the statin to help with higher triglycerides, and may consider in the future pending her status on follow-ups and response to increasing exercise and dietary modifications presently.   She has a planned follow-up in November, and will keep that follow-up and can recheck an A1c at that time.  Can follow-up sooner as needed.      Towanda Malkin, MD 09/12/20 9:17 AM

## 2020-09-12 ENCOUNTER — Other Ambulatory Visit: Payer: Self-pay

## 2020-09-12 ENCOUNTER — Ambulatory Visit (INDEPENDENT_AMBULATORY_CARE_PROVIDER_SITE_OTHER): Payer: Medicare Other | Admitting: Internal Medicine

## 2020-09-12 ENCOUNTER — Encounter: Payer: Self-pay | Admitting: Internal Medicine

## 2020-09-12 VITALS — BP 120/86 | HR 101 | Temp 97.6°F | Resp 16 | Ht 66.0 in | Wt 252.6 lb

## 2020-09-12 DIAGNOSIS — R1033 Periumbilical pain: Secondary | ICD-10-CM | POA: Diagnosis not present

## 2020-09-12 DIAGNOSIS — M05742 Rheumatoid arthritis with rheumatoid factor of left hand without organ or systems involvement: Secondary | ICD-10-CM

## 2020-09-12 DIAGNOSIS — E1165 Type 2 diabetes mellitus with hyperglycemia: Secondary | ICD-10-CM

## 2020-09-12 DIAGNOSIS — R110 Nausea: Secondary | ICD-10-CM | POA: Diagnosis not present

## 2020-09-12 DIAGNOSIS — R42 Dizziness and giddiness: Secondary | ICD-10-CM

## 2020-09-12 DIAGNOSIS — I10 Essential (primary) hypertension: Secondary | ICD-10-CM

## 2020-09-12 DIAGNOSIS — E785 Hyperlipidemia, unspecified: Secondary | ICD-10-CM

## 2020-09-12 DIAGNOSIS — M05741 Rheumatoid arthritis with rheumatoid factor of right hand without organ or systems involvement: Secondary | ICD-10-CM

## 2020-09-12 MED ORDER — METFORMIN HCL ER 500 MG PO TB24: 1000.0000 mg | ORAL_TABLET | Freq: Two times a day (BID) | ORAL | 1 refills | Status: DC

## 2020-09-12 NOTE — Patient Instructions (Signed)
High Triglycerides Eating Plan Triglycerides are a type of fat in the blood. High levels of triglycerides can increase your risk of heart disease and stroke. If your triglyceride levels are high, choosing the right foods can help lower your triglycerides and keep your heart healthy. Work with your health care provider or a diet and nutrition specialist (dietitian) to develop an eating plan that is right for you. What are tips for following this plan? General guidelines   Lose weight, if you are overweight. For most people, losing 5-10 lbs (2-5 kg) helps lower triglyceride levels. A weight-loss plan may include. ? 30 minutes of exercise at least 5 days a week. ? Reducing the amount of calories, sugar, and fat you eat.  Eat a wide variety of fresh fruits, vegetables, and whole grains. These foods are high in fiber.  Eat foods that contain healthy fats, such as fatty fish, nuts, seeds, and olive oil.  Avoid foods that are high in added sugar, added salt (sodium), saturated fat, and trans fat.  Avoid low-fiber, refined carbohydrates such as white bread, crackers, noodles, and white rice.  Avoid foods with partially hydrogenated oils (trans fats), such as fried foods or stick margarine.  Limit alcohol intake to no more than 1 drink a day for nonpregnant women and 2 drinks a day for men. One drink equals 12 oz of beer, 5 oz of wine, or 1 oz of hard liquor. Your health care provider may recommend that you drink less depending on your overall health. Reading food labels  Check food labels for the amount of saturated fat. Choose foods with no or very little saturated fat.  Check food labels for the amount of trans fat. Choose foods with no trans fat.  Check food labels for the amount of cholesterol. Choose foods low in cholesterol. Ask your dietitian how much cholesterol you should have each day.  Check food labels for the amount of sodium. Choose foods with less than 140 milligrams (mg) per  serving. Shopping  Buy dairy products labeled as nonfat (skim) or low-fat (1%).  Avoid buying processed or prepackaged foods. These are often high in added sugar, sodium, and fat. Cooking  Choose healthy fats when cooking, such as olive oil or canola oil.  Cook foods using lower fat methods, such as baking, broiling, boiling, or grilling.  Make your own sauces, dressings, and marinades when possible, instead of buying them. Store-bought sauces, dressings, and marinades are often high in sodium and sugar. Meal planning  Eat more home-cooked food and less restaurant, buffet, and fast food.  Eat fatty fish at least 2 times each week. Examples of fatty fish include salmon, trout, mackerel, tuna, and herring.  If you eat whole eggs, do not eat more than 3 egg yolks per week. What foods are recommended? The items listed may not be a complete list. Talk with your dietitian about what dietary choices are best for you. Grains Whole wheat or whole grain breads, crackers, cereals, and pasta. Unsweetened oatmeal. Bulgur. Barley. Quinoa. Brown rice. Whole wheat flour tortillas. Vegetables Fresh or frozen vegetables. Low-sodium canned vegetables. Fruits All fresh, canned (in natural juice), or frozen fruits. Meats and other protein foods Skinless chicken or turkey. Ground chicken or turkey. Lean cuts of pork, trimmed of fat. Fish and seafood, especially salmon, trout, and herring. Egg whites. Dried beans, peas, or lentils. Unsalted nuts or seeds. Unsalted canned beans. Natural peanut or almond butter. Dairy Low-fat dairy products. Skim or low-fat (1%) milk. Reduced fat (  2%) and low-sodium cheese. Low-fat ricotta cheese. Low-fat cottage cheese. Plain, low-fat yogurt. Fats and oils Tub margarine without trans fats. Light or reduced-fat mayonnaise. Light or reduced-fat salad dressings. Avocado. Safflower, olive, sunflower, soybean, and canola oils. What foods are not recommended? The items listed  may not be a complete list. Talk with your dietitian about what dietary choices are best for you. Grains White bread. White (regular) pasta. White rice. Cornbread. Bagels. Pastries. Crackers that contain trans fat. Vegetables Creamed or fried vegetables. Vegetables in a cheese sauce. Fruits Sweetened dried fruit. Canned fruit in syrup. Fruit juice. Meats and other protein foods Fatty cuts of meat. Ribs. Chicken wings. Bacon. Sausage. Bologna. Salami. Chitterlings. Fatback. Hot dogs. Bratwurst. Packaged lunch meats. Dairy Whole or reduced-fat (2%) milk. Half-and-half. Cream cheese. Full-fat or sweetened yogurt. Full-fat cheese. Nondairy creamers. Whipped toppings. Processed cheese or cheese spreads. Cheese curds. Beverages Alcohol. Sweetened drinks, such as soda, lemonade, fruit drinks, or punches. Fats and oils Butter. Stick margarine. Lard. Shortening. Ghee. Bacon fat. Tropical oils, such as coconut, palm kernel, or palm oils. Sweets and desserts Corn syrup. Sugars. Honey. Molasses. Candy. Jam and jelly. Syrup. Sweetened cereals. Cookies. Pies. Cakes. Donuts. Muffins. Ice cream. Condiments Store-bought sauces, dressings, and marinades that are high in sugar, such as ketchup and barbecue sauce. Summary  High levels of triglycerides can increase the risk of heart disease and stroke. Choosing the right foods can help lower your triglycerides.  Eat plenty of fresh fruits, vegetables, and whole grains. Choose low-fat dairy and lean meats. Eat fatty fish at least twice a week.  Avoid processed and prepackaged foods with added sugar, sodium, saturated fat, and trans fat.  If you need suggestions or have questions about what types of food are good for you, talk with your health care provider or a dietitian. This information is not intended to replace advice given to you by your health care provider. Make sure you discuss any questions you have with your health care provider. Document Revised:  11/07/2017 Document Reviewed: 01/28/2017 Elsevier Patient Education  2020 Elsevier Inc.  

## 2020-09-15 ENCOUNTER — Other Ambulatory Visit: Payer: Self-pay

## 2020-09-15 DIAGNOSIS — G8929 Other chronic pain: Secondary | ICD-10-CM

## 2020-09-15 DIAGNOSIS — M5440 Lumbago with sciatica, unspecified side: Secondary | ICD-10-CM

## 2020-09-15 MED ORDER — GABAPENTIN 300 MG PO CAPS: ORAL_CAPSULE | ORAL | 1 refills | Status: DC

## 2020-09-15 NOTE — Telephone Encounter (Signed)
Please resend did not go through

## 2020-10-11 LAB — HM DIABETES EYE EXAM

## 2020-10-16 ENCOUNTER — Encounter: Payer: Self-pay | Admitting: Internal Medicine

## 2020-10-17 NOTE — Progress Notes (Signed)
Patient ID: Elizabeth [name], female    DOB: Jul 27, 1966, 53 y.o.   MRN: [id number]  PCP: Towanda Malkin, MD  Chief Complaint  Patient presents with  . Follow-up    Subjective:   Elizabeth [name] is a 54 y.o. female, presents to clinic with CC of the following:  Chief Complaint  Patient presents with  . Follow-up    HPI:  Patient is a 54 year old female Follows up today, after our July 19, 2020 visit All in all, feeling good presently  Last visit with me was 09/12/2020 She was seen 09/07/20 for concerns with her blood sugars increasing and some dizziness. Communication with the lab results obtained at that visit was as follows:  The complete metabolic panel showed a slightly elevated glucose at 214, and was otherwise unremarkable. (1 liver test, the ALT was just slightly above the normal range, with all other liver tests normal and not a major concern presently) The hemoglobin A1c was elevated at 8.7. This is consistent with your sugars not being as well controlled, and increasing the Glucophage XR dose to 2 tablets in the morning and 2 tablets in the evening is recommended. The complete blood count was entirely normal. The thyroid panel with a TSH was normal. The lipase was normal. The urinalysis showed trace glucose, otherwise unremarkable. I do think your sugars being less well-controlled may be contributing to your symptoms, and increasing the medicine as above is appropriate.    Diabetes Mellitus Type II: Medication regimen:Glucophage XR 500 mg increased recently to 2 tabs bid as noted above Takes medicines regularly She is checking her blood sugars at home, once every 4-5 days, sugars 100-120, notes better No hypoglycemic episodes No hypoglycemic episodes Lab Results  Component Value Date   HGBA1C 8.7 (H) 09/07/2020   HGBA1C 7.0 (A) 04/18/2020   HGBA1C 7.3 (H) 01/20/2020   Lab Results  Component Value Date   MICROALBUR 1.5 07/19/2020   LDLCALC  66 07/19/2020   CREATININE 0.81 09/07/2020    Denies: Polyuria, increased thirst, vision changes, or increased numbness/tingling in her ext's Has neuropathy history, -currently managed with gabapentin  Last eye exam -had recently, Nov 3rd ACEI/ARB:no, allergy issuewith ACE in past noted Statin:Yes, atorvastatin     Hypertension: Medication regimen-none,  diet controlled. She has not checked her blood pressures at home in recent past,  BP Readings from Last 3 Encounters:  10/19/20 140/82  09/12/20 120/86  09/07/20 124/80    Denies chest pain,palpitations, SOB,increased HA's,increased LE swelling, vision changes  HLD- Medication regimen-atorvastatin 10 mg daily LDL goal <70 Lab Results  Component Value Date   CHOL 134 07/19/2020   HDL 47 (L) 07/19/2020   LDLCALC 66 07/19/2020   TRIG 133 07/19/2020   CHOLHDL 2.9 07/19/2020   Denies myalgias  Bipolar: psychiatrist-Dr. Elige Ko every three months, still seeing presently Not takingZyprexa, Not take trazodone.On no medicines for this presently. Notes has been doing well.  Denied any increased depression symptoms of concern in the recent past  Obesity  Last weights: Wt Readings from Last 3 Encounters:  10/19/20 252 lb 12.8 oz (114.7 kg)  09/12/20 252 lb 9.6 oz (114.6 kg)  09/07/20 251 lb 4.8 oz (114 kg)   Weight stable in recent past, still not having great success losing weight, working on diet, has a nutrition friend and got advice from her, also with a DM group  No regular exercise, tries to stay active with work at home  It was  noted that she does have RA and is taking methotrexate. Last visit with rheumatology was 08/09/2020 with the following assessment/plan noted:  Assessment and Plan   1. Rheumatoid arthritis, stage 2 moderate: Stable  -- Diagnosed in 2015, by Dr. Woodfin Ganja, Rheumatologist (Arthritis and osteoporosis, [place], [place]). -- Failed Oral Methotrexate switich to SQ  methotrexate (0.35m) caused elevated in liver enzyme -- Dose of methotrexate decreased to 0.6 ml (15 mg weekly)  2. Osteoarthritis of the hands -- Take Tylenol on as needed bases  3. Long term use of high risk medication -- Methotrexate is an immunosuppressive medication that require drug toxicity monitoring  -- Check Labs     Patient Active Problem List   Diagnosis Date Noted  . Personal history of colonic polyps   . Polyp of sigmoid colon   . Chronic rhinitis 04/18/2020  . Chronic midline low back pain with bilateral sciatica 01/20/2020  . 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 Dyshidrotic eczema 01/07/2020  . Intraductal papilloma of breast, right 12/28/2019  . Psoriasis 08/06/2019  . Immunosuppressed status ([place] 07/01/2019  . Morbid obesity ([place] 07/01/2019  . Vaginal atrophy 01/01/2017  . Hyperlipidemia LDL goal <70 10/04/2016  . Diverticula of colon 01/24/2016  . Hypertension goal BP (blood pressure) < 130/80 01/17/2016  . Bilateral carpal tunnel syndrome 01/03/2016  . Bursitis, trochanteric 11/22/2015  . Inversion, nipple 11/08/2015  . Type 2 diabetes mellitus with hyperglycemia, without long-term current use of insulin ([place] 10/23/2015  . Rheumatoid arthritis involving both hands ([place] 10/23/2015  . Benign neoplasm of descending colon   . Benign neoplasm of sigmoid colon   . Bipolar disorder, current episode depressed, moderate ([place] 07/03/2015  . IBS (irritable bowel syndrome) 07/03/2015  . Insomnia, uncontrolled 07/03/2015  . GERD without esophagitis 07/03/2015  . Bilateral hip bursitis 07/03/2015      Current Outpatient Medications:  .  Accu-Chek FastClix Lancets MISC, USE TO CHECK BLOOD SUGAR TWICE DAILY, Disp: 100 each, Rfl: 5 .  Accu-Chek Softclix Lancets lancets, Use as instructed, Disp: 100 each, Rfl: 12 .  aspirin 81 MG tablet, Take 81 mg by mouth daily., Disp: , Rfl:  .  atorvastatin (LIPITOR) 10 MG tablet, Take 1 tablet (10 mg total)  by mouth at bedtime., Disp: 90 tablet, Rfl: 3 .  azelastine (ASTELIN) 0.1 % nasal spray, Place 2 sprays into both nostrils 2 (two) times daily. Use in each nostril as directed, Disp: 30 mL, Rfl: 12 .  B-D INS SYR ULTRAFINE 1CC/31G 31G X 5/16" 1 ML MISC, use as directed TO INJECT METHOTREXATE, Disp: , Rfl: 1 .  Blood Glucose Monitoring Suppl (ACCU-CHEK NANO SMARTVIEW) w/Device KIT, Use as directed once daily. LON 99, E11.9, Disp: 1 kit, Rfl: 0 .  cholecalciferol (VITAMIN D) 1000 units tablet, Take 2,000 Units by mouth daily. , Disp: , Rfl:  .  clobetasol ointment (TEMOVATE) 0.05 %, APPLY EXTERNALLY TO THE AFFECTED AREA TWICE DAILY, Disp: , Rfl:  .  folic acid (FOLVITE) 1 MG tablet, Take 1 mg by mouth daily., Disp: , Rfl:  .  gabapentin (NEURONTIN) 300 MG capsule, TAKE 1 CAPSULE BY MOUTH EVERY MORNING AND TAKE 1 AT NOON AND TAKE 2 CAPSULES AT BEDTIME, Disp: 360 capsule, Rfl: 1 .  glucose blood (ACCU-CHEK GUIDE) test strip, AS DIRECTED, Disp: 50 strip, Rfl: 5 .  glucose blood (ACCU-CHEK SMARTVIEW) test strip, USE TO TEST ONCE DAILY, Disp: 100 strip, Rfl: 6 .  LINZESS 290 MCG CAPS capsule, TAKE 1 CAPSULE(290 MCG)  BY MOUTH DAILY, Disp: 90 capsule, Rfl: 1 .  metFORMIN (GLUCOPHAGE-XR) 500 MG 24 hr tablet, Take 2 tablets (1,000 mg total) by mouth 2 (two) times daily., Disp: 360 tablet, Rfl: 1 .  Methotrexate 25 MG/ML SOSY, INJECT 0.6 MILLILITERS SUBCUTANEOUSLY EVERY WEEK FOR 12 WEEKS, Disp: , Rfl:  .  Methotrexate Sodium (METHOTREXATE, PF,) 50 MG/2ML injection, 15 mg once a week., Disp: , Rfl:  .  Multiple Vitamins-Minerals (MULTIVITAMIN/EXTRA VITAMIN D3 PO), Take by mouth., Disp: , Rfl:  .  Multiple Vitamins-Minerals (ZINC PO), Take by mouth., Disp: , Rfl:  .  NASAL ALLERGY 24 HOUR 55 MCG/ACT AERO nasal inhaler, 2 sprays daily., Disp: , Rfl:  .  OLANZapine (ZYPREXA) 15 MG tablet, Take 15 mg by mouth at bedtime., Disp: , Rfl:  .  Omega-3 Fatty Acids (FISH OIL PO), Take by mouth., Disp: , Rfl:  .  OVER THE  COUNTER MEDICATION, once daily Herbal Name: Goli, Disp: , Rfl:  .  SALINE NASAL SPRAY NA, SMARTSIG:2 Spray(s) Both Nares Daily, Disp: , Rfl:  .  traZODone (DESYREL) 100 MG tablet, Take 200 mg by mouth at bedtime as needed., Disp: , Rfl:  .  Turmeric 500 MG CAPS, Take by mouth., Disp: , Rfl:  .  XIIDRA 5 % SOLN, Place 1 drop into both eyes 2 (two) times daily., Disp: , Rfl:    Allergies  Allergen Reactions  . Ace Inhibitors Swelling    Angioedema (specifically from enalapril)  . Enalapril Swelling    angioedema  . Penicillins Hives and Swelling    Has patient had a PCN reaction causing immediate rash, facial/tongue/throat swelling, SOB or lightheadedness with hypotension: Yes Has patient had a PCN reaction causing severe rash involving mucus membranes or skin necrosis: No Has patient had a PCN reaction that required hospitalization Yes Has patient had a PCN reaction occurring within the last 10 years: No If all of the above answers are "NO", then may proceed with Cephalosporin use.  . Statins Hives     Past Surgical History:  Procedure Laterality Date  . BOWEL RESECTION    . BREAST BIOPSY Right 12/22/2019   affirm bx x marker, path pending  . BREAST BIOPSY    . CHOLECYSTECTOMY  1997   [place], [place]  . COLONOSCOPY WITH PROPOFOL N/A 09/07/2015   Procedure: COLONOSCOPY WITH PROPOFOL;  Surgeon: Lucilla Lame, MD;  Location: [place];  Service: Endoscopy;  Laterality: N/A;  . COLONOSCOPY WITH PROPOFOL N/A 05/16/2020   Procedure: COLONOSCOPY WITH PROPOFOL;  Surgeon: Lucilla Lame, MD;  [facility];  Service: Endoscopy;  Laterality: N/A;  . ESOPHAGOGASTRODUODENOSCOPY (EGD) WITH ESOPHAGEAL DILATION    . ESOPHAGOGASTRODUODENOSCOPY (EGD) WITH PROPOFOL N/A 09/07/2015   Procedure: ESOPHAGOGASTRODUODENOSCOPY (EGD) WITH PROPOFOL;  Surgeon: Lucilla Lame, MD;  Location: [place];  Service: Endoscopy;  Laterality: N/A;  . PARTIAL COLECTOMY N/A 01/24/2016   Procedure:  SIGMOID COLECTOMY;  Surgeon: Florene Glen, MD;  Location: [facility];  Service: General;  Laterality: N/A;  . POLYPECTOMY N/A 09/07/2015   Procedure: POLYPECTOMY;  Surgeon: Lucilla Lame, MD;  Location: [place];  Service: Endoscopy;  Laterality: N/A;  . TUBAL LIGATION       Family History  Problem Relation Age of Onset  . Hypertension Mother   . Diabetes Mother   . Arthritis/Rheumatoid Mother   . Hyperlipidemia Mother   . Alcohol abuse Father   . Esophageal varices Father   . Diverticulosis Maternal Aunt   . Diverticulosis Maternal Grandmother   .  Diabetes Maternal Grandmother   . Heart disease Maternal Grandmother   . Dementia Maternal Grandmother   . Diabetes Brother   . Kidney disease Brother   . Aneurysm Maternal Grandfather   . Diabetes Paternal Grandmother   . Healthy Brother   . Depression Daughter        bipolar disorder  . Depression Daughter        bipolar disorder  . Breast cancer Neg Hx      Social History   Tobacco Use  . Smoking status: Former Smoker    Packs/day: 1.00    Years: 1.00    Pack years: 1.00    Types: Cigarettes    Quit date: 05/06/2015    Years since quitting: 5.4  . Smokeless tobacco: Never Used  . Tobacco comment: smoking cessation materials not required  Substance Use Topics  . Alcohol use: Yes    Alcohol/week: 0.0 standard drinks    Comment: rarely small glass of wine    With staff assistance, above reviewed with the patient today.  ROS: As per HPI, notes still has some intermittent chronic low back pain, no significant increase in recent past, otherwise no specific complaints on a limited and focused system review   No results found for this or any previous visit (from the past 72 hour(s)).   PHQ2/9: Depression screen [facility] 2/9 10/19/2020 09/12/2020 09/07/2020 07/19/2020 04/18/2020  Decreased Interest 0 0 0 0 0  Down, Depressed, Hopeless 0 0 0 1 0  PHQ - 2 Score 0 0 0 1 0  Altered sleeping - - - 1 0  Tired, decreased  energy - - - 0 0  Change in appetite - - - 0 0  Feeling bad or failure about yourself  - - - 0 0  Trouble concentrating - - - 1 0  Moving slowly or fidgety/restless - - - 0 0  Suicidal thoughts - - - 0 0  PHQ-9 Score - - - 3 0  Difficult doing work/chores - - - Not difficult at all Not difficult at all  Some recent data might be hidden   PHQ-2/9 Result is neg  Fall Risk: Fall Risk  10/19/2020 09/12/2020 09/07/2020 07/19/2020 07/13/2020  Falls in the past year? 0 0 0 0 0  Comment - - - - -  Number falls in past yr: 0 0 0 0 0  Injury with Fall? 0 0 0 0 0  Comment - - - - -  Risk for fall due to : - - - - -  Risk for fall due to: Comment - - - - -  Follow up - - Falls evaluation completed - -      Objective:   Vitals:   10/19/20 0736  BP: 140/82  Pulse: 85  Resp: 16  Temp: 97.8 F (36.6 C)  TempSrc: Oral  SpO2: 98%  Weight: 252 lb 12.8 oz (114.7 kg)  Height: '5\' 6"'  (1.676 m)    Body mass index is 40.8 kg/m.  Physical Exam   NAD, masked,very pleasant HEENT - [place]/AT, sclera anicteric, PERRL, EOMI, conj - non-inj'ed,  Neck - supple, no adenopathy,  carotids 2+ and equal, no bruits Car - RRR without m/g/r,  Pulm- RR and effort normal at rest, CTA without wheeze or rales Abd - soft,obese,  diffusely nontender,  Back - no CVA tenderness Ext - no LE edema,  Neuro/psychiatric - affect was not flat, appropriate with conversation Alert and oriented Grossly non-focal  Speech normal  Results for orders placed or performed in visit on 10/16/20  HM DIABETES EYE EXAM  Result Value Ref Range   HM Diabetic Eye Exam No Retinopathy No Retinopathy   A1C - 8.0 today , better Last labs reviewed    Assessment & Plan:    1. Type 2 diabetes mellitus with hyperglycemia, without long-term current use of insulin (HCC) An A1c checked in the office today was 8.0, down some from last check, and she did just start with the increased Metformin dose as  noted above in the HPI. Noted goal is the A1c between 6 and 7. Did just have a recent eye exam with no retinopathy concerns noted Noted may need an additional medicine to help control blood sugars in the near future, and will wait to reassess on her next 8-monthfollow-up presently. To consider the addition of a medicine like liraglutide or semaglutide, as they will help with weight loss in addition to blood sugar control.  It is an injectable medicine, which may limit her desire to start this. She will continue to check her blood sugars at home as well. Encouraged further weight loss attempts, and the importance of dietary modifications and trying to increase physical activity levels to help with that.  That will also help with blood sugar control.  2. Hypertension goal BP (blood pressure) < 1424/81Systolic blood pressure borderline today, with blood pressures well controlled in the recent past. Continue to monitor.  3. Hyperlipidemia LDL goal <70 Continue the statin product.  4. Morbid obesity ([place] Encouraged weight loss attempts to continue, and the importance of healthy weight maintenance again discussed today. As above, may consider a liraglutide or semaglutide addition in the near future, as they help with weight loss in addition to blood sugar control.  5. Rheumatoid arthritis involving both hands with positive rheumatoid factor ([place] Continues to be followed by rheumatology. Is on methotrexate, and has had the Covid vaccine as well as a booster.  6. Bipolar affective disorder, remission status unspecified ([place] Has been stable in the recent past.  Will schedule follow-up in 3 months time, and she was made aware today that her next follow-up will be with a different provider as I will be leaving the practice prior to that next follow-up visit. Can follow-up sooner as needed.  CTowanda Malkin MD 10/19/20 8:04 AM

## 2020-10-19 ENCOUNTER — Encounter: Payer: Self-pay | Admitting: Internal Medicine

## 2020-10-19 ENCOUNTER — Ambulatory Visit (INDEPENDENT_AMBULATORY_CARE_PROVIDER_SITE_OTHER): Payer: Medicare Other | Admitting: Internal Medicine

## 2020-10-19 ENCOUNTER — Other Ambulatory Visit: Payer: Self-pay

## 2020-10-19 ENCOUNTER — Telehealth: Payer: Self-pay

## 2020-10-19 VITALS — BP 140/82 | HR 85 | Temp 97.8°F | Resp 16 | Ht 66.0 in | Wt 252.8 lb

## 2020-10-19 DIAGNOSIS — E785 Hyperlipidemia, unspecified: Secondary | ICD-10-CM

## 2020-10-19 DIAGNOSIS — I10 Essential (primary) hypertension: Secondary | ICD-10-CM

## 2020-10-19 DIAGNOSIS — F319 Bipolar disorder, unspecified: Secondary | ICD-10-CM

## 2020-10-19 DIAGNOSIS — E1165 Type 2 diabetes mellitus with hyperglycemia: Secondary | ICD-10-CM | POA: Diagnosis not present

## 2020-10-19 DIAGNOSIS — M05741 Rheumatoid arthritis with rheumatoid factor of right hand without organ or systems involvement: Secondary | ICD-10-CM

## 2020-10-19 DIAGNOSIS — M05742 Rheumatoid arthritis with rheumatoid factor of left hand without organ or systems involvement: Secondary | ICD-10-CM

## 2020-10-19 NOTE — Telephone Encounter (Signed)
errenous °

## 2020-12-05 ENCOUNTER — Encounter: Payer: Self-pay | Admitting: Family Medicine

## 2020-12-05 ENCOUNTER — Telehealth: Payer: Self-pay

## 2020-12-05 ENCOUNTER — Other Ambulatory Visit: Payer: Self-pay

## 2020-12-05 ENCOUNTER — Telehealth (INDEPENDENT_AMBULATORY_CARE_PROVIDER_SITE_OTHER): Payer: Medicare Other | Admitting: Family Medicine

## 2020-12-05 DIAGNOSIS — J069 Acute upper respiratory infection, unspecified: Secondary | ICD-10-CM | POA: Diagnosis not present

## 2020-12-05 DIAGNOSIS — J209 Acute bronchitis, unspecified: Secondary | ICD-10-CM | POA: Diagnosis not present

## 2020-12-05 DIAGNOSIS — D849 Immunodeficiency, unspecified: Secondary | ICD-10-CM

## 2020-12-05 DIAGNOSIS — H9209 Otalgia, unspecified ear: Secondary | ICD-10-CM | POA: Diagnosis not present

## 2020-12-05 DIAGNOSIS — Z8701 Personal history of pneumonia (recurrent): Secondary | ICD-10-CM

## 2020-12-05 DIAGNOSIS — J310 Chronic rhinitis: Secondary | ICD-10-CM

## 2020-12-05 MED ORDER — ALBUTEROL SULFATE HFA 108 (90 BASE) MCG/ACT IN AERS: 2.0000 | INHALATION_SPRAY | RESPIRATORY_TRACT | 1 refills | Status: DC | PRN

## 2020-12-05 MED ORDER — AZITHROMYCIN 250 MG PO TABS: 250.0000 mg | ORAL_TABLET | Freq: Every day | ORAL | 0 refills | Status: DC

## 2020-12-05 MED ORDER — PREDNISONE 20 MG PO TABS: 40.0000 mg | ORAL_TABLET | Freq: Every day | ORAL | 0 refills | Status: AC

## 2020-12-05 MED ORDER — BENZONATATE 100 MG PO CAPS: 100.0000 mg | ORAL_CAPSULE | Freq: Three times a day (TID) | ORAL | 1 refills | Status: DC | PRN

## 2020-12-05 NOTE — Patient Instructions (Signed)
For acute bronchitis  Prednisone in the morning for 5 days  (You do not have to get the albuterol inhaler if you do not feel like you need it but I wanted to make it available to you ) use albuterol inhaler as needed (will use more today and for the next few days, and then you should be able to use less 3 days from now)  Kimberlee Nearing is a cough medicine you can use throughout the day as needed  Mucinex over the counter - it helps break up mucous in your chest.  You can get Mucinex or mucinex-D or -DM  For your ears I suggest continuing nasal sprays such as Flonase, saline sprays, taking an antihistamine daily and adding decongestants like Sudafed if you can tolerate   Drink ample clear fluids  REST!  Take tylenol as needed for fever and/or aches  Should be feeling a little better in 3-5 days  Cough may linger for a few weeks or sometimes up to 6 weeks, which is normal!    Please return to be rechecked if you are feeling worse at any point, or if you do not feel improved much in the next 1-2 weeks.  Concerning new or worsening symptoms include chest pain, shortness of breath, fever, coughing up blood, night sweats, unintentional weight loss, confusion, decreased urine.

## 2020-12-05 NOTE — Telephone Encounter (Signed)
Copied from CRM [id number]. Topic: General - Inquiry >> Dec 05, 2020  3:54 PM Daphine Deutscher D wrote: Reason for CRM: [facility] called to say Albuterol is not covered by pt's insurance.  They want to know if they can get a PA for that rx.

## 2020-12-05 NOTE — Progress Notes (Signed)
Name: Elizabeth [name]   MRN: [id number]    DOB: 1966/02/15   Date:12/05/2020       Progress Note  Subjective:    Chief Complaint  Chief Complaint  Patient presents with  . URI    Dry cough, ear pain, hurts to cough, fever 100.2 for 2 days  SOB, mild chest pain for 3 days    I connected with  [name]  on 12/05/20 at  1:00 PM EST by a video enabled telemedicine application and verified that I am speaking with the correct person using two identifiers.  I discussed the limitations of evaluation and management by telemedicine and the availability of in person appointments. The patient expressed understanding and agreed to proceed. Staff also discussed with the patient that there may be a patient responsible charge related to this service. Patient Location: home Provider Location[facility] Additional Individuals present: none  HPI Dry cough since christmas, just had something productive today only -sputum was brownish She started with nasal sx, drainage, congestion, post-nasal drip  She has tightness and "pain" in her chest with associated SOB only when coughing, no CP or SOB with activity Chills come and go, to me patient denied fever or sweats but to CMA she noted fever to 100.2 x 2 days COVID test yesterday - told will get results in 1-2 days She is vaccinated for Covid x3, got her flu shot this year. No known sick contacts History of chronic rhinitis and pneumonia x2 last year no other pulmonary history Per chart also has a history of immunosuppression    Patient Active Problem List   Diagnosis Date Noted  . Personal history of colonic polyps   . Polyp of sigmoid colon   . Chronic rhinitis 04/18/2020  . Chronic midline low back pain with bilateral sciatica 01/20/2020  . 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 Dyshidrotic eczema 01/07/2020  . Intraductal papilloma of breast, right 12/28/2019  . Psoriasis 08/06/2019  . Immunosuppressed status ([place])  07/01/2019  . Morbid obesity ([place]) 07/01/2019  . Vaginal atrophy 01/01/2017  . Hyperlipidemia LDL goal <70 10/04/2016  . Diverticula of colon 01/24/2016  . Hypertension goal BP (blood pressure) < 130/80 01/17/2016  . Bilateral carpal tunnel syndrome 01/03/2016  . Bursitis, trochanteric 11/22/2015  . Inversion, nipple 11/08/2015  . Type 2 diabetes mellitus with hyperglycemia, without long-term current use of insulin ([place]) 10/23/2015  . Rheumatoid arthritis involving both hands ([place]) 10/23/2015  . Benign neoplasm of descending colon   . Benign neoplasm of sigmoid colon   . Bipolar disorder, current episode depressed, moderate ([place]) 07/03/2015  . IBS (irritable bowel syndrome) 07/03/2015  . Insomnia, uncontrolled 07/03/2015  . GERD without esophagitis 07/03/2015  . Bilateral hip bursitis 07/03/2015    Social History   Tobacco Use  . Smoking status: Former Smoker    Packs/day: 1.00    Years: 1.00    Pack years: 1.00    Types: Cigarettes    Quit date: 05/06/2015    Years since quitting: 5.5  . Smokeless tobacco: Never Used  . Tobacco comment: smoking cessation materials not required  Substance Use Topics  . Alcohol use: Yes    Alcohol/week: 0.0 standard drinks    Comment: rarely small glass of wine     Current Outpatient Medications:  .  aspirin 81 MG tablet, Take 81 mg by mouth daily., Disp: , Rfl:  .  atorvastatin (LIPITOR) 10 MG tablet, Take 1 tablet (10 mg total)  by mouth at bedtime., Disp: 90 tablet, Rfl: 3 .  azelastine (ASTELIN) 0.1 % nasal spray, Place 2 sprays into both nostrils 2 (two) times daily. Use in each nostril as directed, Disp: 30 mL, Rfl: 12 .  azithromycin (ZITHROMAX Z-PAK) 250 MG tablet, Take 1 tablet (250 mg total) by mouth daily. 518m PO day 1, then 2532mPO days 2-5, Disp: 6 tablet, Rfl: 0 .  benzonatate (TESSALON) 100 MG capsule, Take 1 capsule (100 mg total) by mouth 3 (three) times daily as needed for cough., Disp: 60 capsule, Rfl: 1 .   cholecalciferol (VITAMIN D) 1000 units tablet, Take 2,000 Units by mouth daily. , Disp: , Rfl:  .  clobetasol ointment (TEMOVATE) 0.05 %, APPLY EXTERNALLY TO THE AFFECTED AREA TWICE DAILY, Disp: , Rfl:  .  folic acid (FOLVITE) 1 MG tablet, Take 1 mg by mouth daily., Disp: , Rfl:  .  gabapentin (NEURONTIN) 300 MG capsule, TAKE 1 CAPSULE BY MOUTH EVERY MORNING AND TAKE 1 AT NOON AND TAKE 2 CAPSULES AT BEDTIME, Disp: 360 capsule, Rfl: 1 .  LINZESS 290 MCG CAPS capsule, TAKE 1 CAPSULE(290 MCG) BY MOUTH DAILY, Disp: 90 capsule, Rfl: 1 .  metFORMIN (GLUCOPHAGE-XR) 500 MG 24 hr tablet, Take 2 tablets (1,000 mg total) by mouth 2 (two) times daily., Disp: 360 tablet, Rfl: 1 .  Methotrexate 25 MG/ML SOSY, INJECT 0.6 MILLILITERS SUBCUTANEOUSLY EVERY WEEK FOR 12 WEEKS, Disp: , Rfl:  .  Methotrexate Sodium (METHOTREXATE, PF,) 50 MG/2ML injection, 15 mg once a week., Disp: , Rfl:  .  Multiple Vitamins-Minerals (MULTIVITAMIN/EXTRA VITAMIN D3 PO), Take by mouth., Disp: , Rfl:  .  Multiple Vitamins-Minerals (ZINC PO), Take by mouth., Disp: , Rfl:  .  NASAL ALLERGY 24 HOUR 55 MCG/ACT AERO nasal inhaler, 2 sprays daily., Disp: , Rfl:  .  OLANZapine (ZYPREXA) 15 MG tablet, Take 15 mg by mouth at bedtime., Disp: , Rfl:  .  Omega-3 Fatty Acids (FISH OIL PO), Take by mouth., Disp: , Rfl:  .  OVER THE COUNTER MEDICATION, once daily Herbal Name: Goli, Disp: , Rfl:  .  predniSONE (DELTASONE) 20 MG tablet, Take 2 tablets (40 mg total) by mouth daily with breakfast for 5 days., Disp: 10 tablet, Rfl: 0 .  SALINE NASAL SPRAY NA, SMARTSIG:2 Spray(s) Both Nares Daily, Disp: , Rfl:  .  traZODone (DESYREL) 100 MG tablet, Take 200 mg by mouth at bedtime as needed., Disp: , Rfl:  .  Turmeric 500 MG CAPS, Take by mouth., Disp: , Rfl:  .  XIIDRA 5 % SOLN, Place 1 drop into both eyes 2 (two) times daily., Disp: , Rfl:  .  Accu-Chek FastClix Lancets MISC, USE TO CHECK BLOOD SUGAR TWICE DAILY (Patient not taking: Reported on 12/05/2020),  Disp: 100 each, Rfl: 5 .  Accu-Chek Softclix Lancets lancets, Use as instructed (Patient not taking: Reported on 12/05/2020), Disp: 100 each, Rfl: 12 .  B-D INS SYR ULTRAFINE 1CC/31G 31G X 5/16" 1 ML MISC, use as directed TO INJECT METHOTREXATE (Patient not taking: Reported on 12/05/2020), Disp: , Rfl: 1 .  Blood Glucose Monitoring Suppl (ACCU-CHEK NANO SMARTVIEW) w/Device KIT, Use as directed once daily. LON 99, E11.9 (Patient not taking: Reported on 12/05/2020), Disp: 1 kit, Rfl: 0 .  glucose blood (ACCU-CHEK GUIDE) test strip, AS DIRECTED (Patient not taking: Reported on 12/05/2020), Disp: 50 strip, Rfl: 5 .  glucose blood (ACCU-CHEK SMARTVIEW) test strip, USE TO TEST ONCE DAILY (Patient not taking: Reported on 12/05/2020), Disp: 100  strip, Rfl: 6  Allergies  Allergen Reactions  . Ace Inhibitors Swelling    Angioedema (specifically from enalapril)  . Enalapril Swelling    angioedema  . Penicillins Hives and Swelling    Has patient had a PCN reaction causing immediate rash, facial/tongue/throat swelling, SOB or lightheadedness with hypotension: Yes Has patient had a PCN reaction causing severe rash involving mucus membranes or skin necrosis: No Has patient had a PCN reaction that required hospitalization Yes Has patient had a PCN reaction occurring within the last 10 years: No If all of the above answers are "NO", then may proceed with Cephalosporin use.  . Statins Hives   I personally reviewed active problem list, medication list, allergies, family history, social history, health maintenance, notes from last encounter, lab results, imaging with the patient/caregiver today.   Review of Systems  Constitutional: Positive for chills. Negative for diaphoresis and fatigue.  HENT: Positive for congestion, ear pain, postnasal drip, rhinorrhea, sinus pressure and sinus pain. Negative for facial swelling and voice change.   Eyes: Negative.   Respiratory: Positive for cough.   Cardiovascular:  Negative for palpitations and leg swelling.  Gastrointestinal: Negative.   Endocrine: Negative.   Genitourinary: Negative.   Musculoskeletal: Negative.   Skin: Negative.   Allergic/Immunologic: Negative.   Neurological: Negative.   Hematological: Negative.   All other systems reviewed and are negative.     Objective:   Virtual encounter, vitals limited, only able to obtain the following There were no vitals filed for this visit. There is no height or weight on file to calculate BMI. Nursing Note and Vital Signs reviewed.  Physical Exam Vitals and nursing note reviewed.  Constitutional:      General: She is not in acute distress.    Appearance: Normal appearance. She is obese. She is not ill-appearing, toxic-appearing or diaphoretic.  HENT:     Head: Normocephalic and atraumatic.     Right Ear: External ear normal.     Left Ear: External ear normal.  Neck:     Trachea: Trachea and phonation normal.  Pulmonary:     Effort: Pulmonary effort is normal. No respiratory distress.     Comments: Intermittent coughing sometimes in proximal fits, no audible wheeze, stridor, no respiratory distress, retractions, or accessory muscle use Neurological:     Mental Status: She is alert.  Psychiatric:        Mood and Affect: Mood normal.        Behavior: Behavior normal.     PE limited by telephone encounter  No results found for this or any previous visit (from the past 72 hour(s)).  Assessment and Plan:     ICD-10-CM   1. Acute bronchitis, unspecified organism  J20.9 benzonatate (TESSALON) 100 MG capsule    predniSONE (DELTASONE) 20 MG tablet    azithromycin (ZITHROMAX Z-PAK) 250 MG tablet   encouraged pt to get cough meds, mucinex, rx for tessalon, prednisone zpak due to CP/SOB/wheeze, close f/up if not improving  2. History of bacterial pneumonia  Z87.01    treating cough/respiratory sx more aggressively due to her hx of pneumonia 2x in the past year, no other asthma or COPD  hx per pt  3. Upper respiratory tract infection, unspecified type  J06.9    COVID test pending, pt vaccincated for COVID x3, got flu shot, may be other viral etiology, tx supportive and symptomatic  4. Otalgia, unspecified laterality  H92.09    intermittent, sharp, likely secondary to URI+chronic rhinitis - ETD -  encouraged her to continue nasal sprays, saline, allergy meds and add decongestants     -Red flags and when to present for emergency care or RTC including fever >101.104F, chest pain, shortness of breath, new/worsening/un-resolving symptoms, reviewed with patient at time of visit. Follow up and care instructions discussed and provided in AVS. - I discussed the assessment and treatment plan with the patient. The patient was provided an opportunity to ask questions and all were answered. The patient agreed with the plan and demonstrated an understanding of the instructions.  I provided 30+ minutes of non-face-to-face time during this encounter.  Delsa Grana, PA-C 12/05/20 1:35 PM

## 2020-12-06 NOTE — Telephone Encounter (Signed)
Per [facility]. This has been resolved on 12/28

## 2020-12-20 ENCOUNTER — Encounter: Payer: Self-pay | Admitting: Internal Medicine

## 2021-01-10 ENCOUNTER — Other Ambulatory Visit: Payer: Self-pay | Admitting: Family Medicine

## 2021-01-10 NOTE — Telephone Encounter (Signed)
Medication Refill - Medication: LINZESS 52 MCG CAPS capsule  Pt is completely out of her current supply   Has the patient contacted their pharmacy? Yes.   (Agent: If no, request that the patient contact the pharmacy for the refill.) (Agent: If yes, when and what did the pharmacy advise?)  Preferred Pharmacy (with phone number or street name):  [facility], [place] - [address] [facility]  [address] [place] [address]  Phone: [phone] Fax: [phone]     Agent: Please be advised that RX refills may take up to 3 business days. We ask that you follow-up with your pharmacy.

## 2021-01-12 MED ORDER — LINACLOTIDE 290 MCG PO CAPS
290.0000 ug | ORAL_CAPSULE | Freq: Every day | ORAL | 0 refills | Status: DC
Start: 2021-01-12 — End: 2021-03-21

## 2021-01-30 ENCOUNTER — Other Ambulatory Visit: Payer: Self-pay

## 2021-01-31 ENCOUNTER — Other Ambulatory Visit: Payer: Self-pay

## 2021-01-31 ENCOUNTER — Encounter: Payer: Self-pay | Admitting: Family Medicine

## 2021-01-31 ENCOUNTER — Ambulatory Visit (INDEPENDENT_AMBULATORY_CARE_PROVIDER_SITE_OTHER): Payer: Medicare Other | Admitting: Family Medicine

## 2021-01-31 VITALS — BP 134/90 | HR 97 | Temp 97.8°F | Resp 16 | Ht 66.0 in | Wt 247.2 lb

## 2021-01-31 DIAGNOSIS — E1165 Type 2 diabetes mellitus with hyperglycemia: Secondary | ICD-10-CM | POA: Diagnosis not present

## 2021-01-31 DIAGNOSIS — M5441 Lumbago with sciatica, right side: Secondary | ICD-10-CM | POA: Diagnosis not present

## 2021-01-31 DIAGNOSIS — G8929 Other chronic pain: Secondary | ICD-10-CM

## 2021-01-31 DIAGNOSIS — M5442 Lumbago with sciatica, left side: Secondary | ICD-10-CM

## 2021-01-31 DIAGNOSIS — I10 Essential (primary) hypertension: Secondary | ICD-10-CM | POA: Diagnosis not present

## 2021-01-31 DIAGNOSIS — Z1231 Encounter for screening mammogram for malignant neoplasm of breast: Secondary | ICD-10-CM

## 2021-01-31 LAB — POCT GLYCOSYLATED HEMOGLOBIN (HGB A1C): Hemoglobin A1C: 9.2 % — AB (ref 4.0–5.6)

## 2021-01-31 LAB — BASIC METABOLIC PANEL
BUN: 11 mg/dL (ref 7–25)
CO2: 28 mmol/L (ref 20–32)
Calcium: 9.5 mg/dL (ref 8.6–10.4)
Chloride: 104 mmol/L (ref 98–110)
Creat: 0.8 mg/dL (ref 0.50–1.05)
Glucose, Bld: 174 mg/dL — ABNORMAL HIGH (ref 65–99)
Potassium: 4.4 mmol/L (ref 3.5–5.3)
Sodium: 137 mmol/L (ref 135–146)

## 2021-01-31 MED ORDER — ATORVASTATIN CALCIUM 10 MG PO TABS: 10.0000 mg | ORAL_TABLET | Freq: Every day | ORAL | 3 refills | Status: DC

## 2021-01-31 MED ORDER — EMPAGLIFLOZIN 10 MG PO TABS: 10.0000 mg | ORAL_TABLET | Freq: Every day | ORAL | 3 refills | Status: DC

## 2021-01-31 NOTE — Assessment & Plan Note (Signed)
Controlled, continue to monitor.

## 2021-01-31 NOTE — Assessment & Plan Note (Addendum)
Uncontrolled and worsened. Will add jardiance. Referral sent to CCM for med assistance. Plan to f/u 4 weeks after starting. Foot exam wnl today.

## 2021-01-31 NOTE — Assessment & Plan Note (Signed)
Worsened. Discussed increasing gabapentin though patient elects to establish with pain management, referral placed. Weight loss would also be of benefit.

## 2021-01-31 NOTE — Patient Instructions (Signed)
It was great to see you!  Our plans for today:  - We are starting a new medication for your diabetes. If this is too expensive at the pharmacy, we will try to get this through the manufacturer at a reduced cost. A member of our pharmacy team will reach out to you.  - I sent refill of your lipitor to the pharmacy.  We are checking some labs today, we will release these results to your MyChart.  Take care and seek immediate care sooner if you develop any concerns.   Dr. Ky Barban

## 2021-01-31 NOTE — Progress Notes (Signed)
   SUBJECTIVE:   CHIEF COMPLAINT / HPI:   Patient Active Problem List   Diagnosis Date Noted  . Personal history of colonic polyps   . Polyp of sigmoid colon   . Chronic rhinitis 04/18/2020  . Chronic midline low back pain with bilateral sciatica 01/20/2020  . 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 Dyshidrotic eczema 01/07/2020  . Intraductal papilloma of breast, right 12/28/2019  . Psoriasis 08/06/2019  . Immunosuppressed status ([place]) 07/01/2019  . Morbid obesity ([place]) 07/01/2019  . Vaginal atrophy 01/01/2017  . Hyperlipidemia LDL goal <70 10/04/2016  . Diverticula of colon 01/24/2016  . Hypertension goal BP (blood pressure) < 130/80 01/17/2016  . Bilateral carpal tunnel syndrome 01/03/2016  . Bursitis, trochanteric 11/22/2015  . Inversion, nipple 11/08/2015  . Type 2 diabetes mellitus with hyperglycemia, without long-term current use of insulin ([place]) 10/23/2015  . Rheumatoid arthritis involving both hands ([place]) 10/23/2015  . Benign neoplasm of descending colon   . Benign neoplasm of sigmoid colon   . Bipolar disorder, current episode depressed, moderate ([place]) 07/03/2015  . IBS (irritable bowel syndrome) 07/03/2015  . Insomnia, uncontrolled 07/03/2015  . GERD without esophagitis 07/03/2015  . Bilateral hip bursitis 07/03/2015   Hypertension: - Medications: none - Compliance: n/a - Checking BP at home: no - Denies any SOB, CP, vision changes, LE edema, medication SEs, or symptoms of hypotension  Diabetes, Type 2 - Last A1c 8.7 08/2020 - Medications: metformin 1000mg  BID - Compliance: good - Checking BG at home: yes, 170-190s - Eye exam: UTD - Foot exam: due - Microalbumin: UTD - Statin: yes - Denies symptoms of hypoglycemia, polyuria, polydipsia, numbness extremities, foot ulcers/trauma  HLD - medications: lipitor 10mg  - compliance: good - medication SEs: none  R sided Sciatic pain - has been walking more recently with subsequent increase in  sciactic pain. Gabapentin not helping as much. Movement makes worse. Previously seen by PT. Described as nagging pain. Radiates to knee. No fevers, no loss of bowel or bladder control.    OBJECTIVE:   BP 134/90   Pulse 97   Temp 97.8 F (36.6 C) (Oral)   Resp 16   Ht 5\' 6"  (1.676 m)   Wt 247 lb 3.2 oz (112.1 kg)   LMP 07/09/2014   SpO2 98%   BMI 39.90 kg/m   Gen: well appearing, in NAD, overweight Card: RRR Lungs: CTAB Ext: WWP, no edema. Foot exam completed with microfilament exam, wnl.   ASSESSMENT/PLAN:   Hypertension goal BP (blood pressure) < 130/80 Controlled, continue to monitor.  Type 2 diabetes mellitus with hyperglycemia, without long-term current use of insulin (HCC) Uncontrolled and worsened. Will add jardiance. Referral sent to CCM for med assistance. Plan to f/u 4 weeks after starting. Foot exam wnl today.   Chronic midline low back pain with bilateral sciatica Worsened. Discussed increasing gabapentin though patient elects to establish with pain management, referral placed. Weight loss would also be of benefit.     Myles Gip, DO

## 2021-02-01 ENCOUNTER — Encounter: Payer: Self-pay | Admitting: Family Medicine

## 2021-02-01 ENCOUNTER — Telehealth: Payer: Self-pay | Admitting: *Deleted

## 2021-02-01 NOTE — Chronic Care Management (AMB) (Signed)
  Chronic Care Management   Note  02/01/2021 Name: KALLEIGH HARBOR MRN: [id number] DOB: 03-Mar-1966  PATRINA ANDREAS is a 55 y.o. year old female who is a primary care patient of Towanda Malkin, MD (Inactive). I reached out to [name] by phone today in response to a referral sent by Ms. Cristopher Peru Aleman's PCP, Myles Gip, DO.  Ms. Apgar was given information about Chronic Care Management services today including:  1. CCM service includes personalized support from designated clinical staff supervised by her physician, including individualized plan of care and coordination with other care providers 2. 24/7 contact phone numbers for assistance for urgent and routine care needs. 3. Service will only be billed when office clinical staff spend 20 minutes or more in a month to coordinate care. 4. Only one practitioner may furnish and bill the service in a calendar month. 5. The patient may stop CCM services at any time (effective at the end of the month) by phone call to the office staff. 6. The patient will be responsible for cost sharing (co-pay) of up to 20% of the service fee (after annual deductible is met).  Patient did not agree to enrollment in care management services and does not wish to consider at this time.  Follow up plan: Patient declines engagement by the care management team. Appropriate care team members and provider have been notified via electronic communication. The care management team is available to follow up with the patient after provider conversation with the patient regarding recommendation for care management engagement and subsequent re-referral to the care management team.   [place] [facility]

## 2021-02-02 ENCOUNTER — Telehealth: Payer: Self-pay

## 2021-02-02 NOTE — Telephone Encounter (Signed)
Copied from [place] [id number]. Topic: General - Other >> Feb 01, 2021 10:35 AM Keene Breath wrote: Reason for CRM: Patient is requesting a call from Dr. Carmela Hurt nurse regarding her BP.  She stated it is elevated again today, 169/88.  She needs advice on what she should do.  CB# [phone]

## 2021-02-05 ENCOUNTER — Ambulatory Visit: Payer: Medicare Other | Admitting: Family Medicine

## 2021-02-05 NOTE — Telephone Encounter (Signed)
Perfect, thanks 

## 2021-02-05 NOTE — Telephone Encounter (Signed)
She can come in for an appointment so we evaluate further.

## 2021-02-05 NOTE — Telephone Encounter (Signed)
Patient was called. This must have been an old message. She states that she talked with you already. BP is doing much better now.

## 2021-02-15 ENCOUNTER — Other Ambulatory Visit: Payer: Self-pay

## 2021-02-15 ENCOUNTER — Ambulatory Visit
Admission: RE | Admit: 2021-02-15 | Discharge: 2021-02-15 | Disposition: A | Discharge status: Home / Self Care | Payer: Medicare Other | Source: Ambulatory Visit | Attending: Family Medicine | Admitting: Family Medicine

## 2021-02-15 DIAGNOSIS — Z1231 Encounter for screening mammogram for malignant neoplasm of breast: Secondary | ICD-10-CM | POA: Insufficient documentation

## 2021-02-19 ENCOUNTER — Other Ambulatory Visit: Payer: Self-pay

## 2021-02-19 ENCOUNTER — Ambulatory Visit (INDEPENDENT_AMBULATORY_CARE_PROVIDER_SITE_OTHER): Payer: Medicare Other | Admitting: Family Medicine

## 2021-02-19 ENCOUNTER — Encounter: Payer: Self-pay | Admitting: Family Medicine

## 2021-02-19 VITALS — BP 120/74 | HR 72 | Temp 97.6°F | Resp 16 | Ht 66.0 in | Wt 239.0 lb

## 2021-02-19 DIAGNOSIS — I10 Essential (primary) hypertension: Secondary | ICD-10-CM | POA: Diagnosis not present

## 2021-02-19 DIAGNOSIS — E1165 Type 2 diabetes mellitus with hyperglycemia: Secondary | ICD-10-CM

## 2021-02-19 NOTE — Assessment & Plan Note (Signed)
Doing well on current regimen, no changes made today. 

## 2021-02-19 NOTE — Patient Instructions (Signed)
It was great to see you!  Our plans for today:  - No changes to your medications.  - We'll see you back for follow up in June for repeat A1c.  Take care and seek immediate care sooner if you develop any concerns.   Dr. Ky Barban

## 2021-02-19 NOTE — Progress Notes (Signed)
   SUBJECTIVE:   CHIEF COMPLAINT / HPI:   Hypertension: - Medications: none - Compliance: n/a - Checking BP at home: yes, 120s. Readings initially elevated when sick with food poisoning a few weeks ago but now normalized since better. - Denies any SOB, CP, vision changes, LE edema, medication SEs, or symptoms of hypotension  Diabetes, Type 2 - Last A1c 9.2 01/31/21 - Medications: metformin 1000mg  BID, jardiance 10mg  - Compliance: good - Checking BG at home: yes, 110-140s - Diet: cut carbs - Exercise: walking 6000 steps per day - Eye exam: UTD - Foot exam: UTD - Microalbumin: UTD - Statin: yes - Denies symptoms of hypoglycemia.   OBJECTIVE:   BP 120/74   Pulse 72   Temp 97.6 F (36.4 C) (Oral)   Resp 16   Ht 5\' 6"  (1.676 m)   Wt 239 lb (108.4 kg)   LMP 07/09/2014   SpO2 96%   BMI 38.58 kg/m   Gen: well appearing, in NAD Card: Reg rate Lungs: Comfortable WOB on RA Ext: WWP   ASSESSMENT/PLAN:   Hypertension goal BP (blood pressure) < 130/80 Doing well on current regimen, no changes made today.  Type 2 diabetes mellitus with hyperglycemia, without long-term current use of insulin (HCC) CBGs better controlled since initiation of jardiance. Doing well without side effects, continue current regimen and return in June for repeat A1c.    Myles Gip, DO

## 2021-02-19 NOTE — Assessment & Plan Note (Signed)
CBGs better controlled since initiation of jardiance. Doing well without side effects, continue current regimen and return in June for repeat A1c.

## 2021-03-05 LAB — HM DIABETES EYE EXAM

## 2021-03-15 ENCOUNTER — Ambulatory Visit (HOSPITAL_BASED_OUTPATIENT_CLINIC_OR_DEPARTMENT_OTHER): Payer: Medicare Other | Admitting: Student in an Organized Health Care Education/Training Program

## 2021-03-15 ENCOUNTER — Encounter: Payer: Self-pay | Admitting: Student in an Organized Health Care Education/Training Program

## 2021-03-15 ENCOUNTER — Other Ambulatory Visit: Payer: Self-pay

## 2021-03-15 ENCOUNTER — Ambulatory Visit
Admission: RE | Admit: 2021-03-15 | Discharge: 2021-03-15 | Disposition: A | Discharge status: Home / Self Care | Payer: Medicare Other | Source: Ambulatory Visit | Attending: Student in an Organized Health Care Education/Training Program | Admitting: Student in an Organized Health Care Education/Training Program

## 2021-03-15 ENCOUNTER — Ambulatory Visit
Admission: RE | Admit: 2021-03-15 | Discharge: 2021-03-15 | Disposition: A | Discharge status: Home / Self Care | Payer: Medicare Other | Attending: Student in an Organized Health Care Education/Training Program | Admitting: Student in an Organized Health Care Education/Training Program

## 2021-03-15 VITALS — BP 123/80 | HR 70 | Temp 97.2°F | Resp 16 | Ht 66.0 in | Wt 236.9 lb

## 2021-03-15 DIAGNOSIS — G894 Chronic pain syndrome: Secondary | ICD-10-CM

## 2021-03-15 DIAGNOSIS — M5442 Lumbago with sciatica, left side: Secondary | ICD-10-CM | POA: Insufficient documentation

## 2021-03-15 DIAGNOSIS — M25551 Pain in right hip: Secondary | ICD-10-CM | POA: Insufficient documentation

## 2021-03-15 DIAGNOSIS — M533 Sacrococcygeal disorders, not elsewhere classified: Secondary | ICD-10-CM | POA: Insufficient documentation

## 2021-03-15 DIAGNOSIS — M25562 Pain in left knee: Secondary | ICD-10-CM | POA: Insufficient documentation

## 2021-03-15 DIAGNOSIS — M25561 Pain in right knee: Secondary | ICD-10-CM

## 2021-03-15 DIAGNOSIS — M5441 Lumbago with sciatica, right side: Secondary | ICD-10-CM | POA: Diagnosis present

## 2021-03-15 DIAGNOSIS — M5416 Radiculopathy, lumbar region: Secondary | ICD-10-CM | POA: Insufficient documentation

## 2021-03-15 DIAGNOSIS — G8929 Other chronic pain: Secondary | ICD-10-CM

## 2021-03-15 DIAGNOSIS — M25552 Pain in left hip: Secondary | ICD-10-CM

## 2021-03-15 MED ORDER — TIZANIDINE HCL 4 MG PO TABS: 4.0000 mg | ORAL_TABLET | Freq: Every evening | ORAL | 1 refills | Status: DC | PRN

## 2021-03-15 NOTE — Progress Notes (Signed)
Patient: Elizabeth [name]  Service Category: E/M  Provider: Gillis Santa, MD  DOB: 1966-08-15  DOS: 03/15/2021  Referring Provider: Peggye Pitt  MRN: [id number]  Setting: Ambulatory outpatient  PCP: Myles Gip, DO  Type: New Patient  Specialty: Interventional Pain Management    Location: Office  Delivery: Face-to-face     Primary Reason(s) for Visit: Encounter for initial evaluation of one or more chronic problems (new to examiner) potentially causing chronic pain, and posing a threat to normal musculoskeletal function. (Level of risk: High) CC: Back Pain (lower)  HPI  Elizabeth [name] is a 55 y.o. year old, female patient, who comes for the first time to our practice referred by Myles Gip, DO for our initial evaluation of her chronic pain. She has Bipolar disorder, current episode depressed, moderate ([place]); IBS (irritable bowel syndrome); Insomnia, uncontrolled; GERD without esophagitis; Bilateral hip bursitis; Benign neoplasm of descending colon; Benign neoplasm of sigmoid colon; Type 2 diabetes mellitus with hyperglycemia, without long-term current use of insulin ([place]); Rheumatoid arthritis involving both hands ([place]); Inversion, nipple; Bursitis, trochanteric; Hypertension goal BP (blood pressure) < 130/80; Diverticula of colon; Bilateral carpal tunnel syndrome; Hyperlipidemia LDL goal <70; Vaginal atrophy; Immunosuppressed status ([place]); Morbid obesity ([place]); Psoriasis; Intraductal papilloma of breast, right; Pustular psoriasis of palms and soles; Dyshidrotic eczema; Chronic bilateral low back pain with bilateral sciatica; Chronic rhinitis; Personal history of colonic polyps; Polyp of sigmoid colon; Chronic radicular lumbar pain; Sacroiliac joint pain; and Bilateral hip pain on their problem list. Today she comes in for evaluation of her Back Pain (lower)  Pain Assessment: Location: Lower Back Radiating: through buttocks bilat, down the back of one leg to knee, then up and across and  down the back of other leg to knee, and so on... Onset: More than a month ago Duration: Chronic pain Quality: Aching,Burning,Sharp,Shooting Severity: 5 /10 (subjective, self-reported pain score)  Effect on ADL: limits daily activities, disrupts sleep Timing: Constant Modifying factors: nothing is helping BP: 123/80  HR: 70  Onset and Duration: Date of injury: 04/2018 Cause of pain: yardwork Severity: NAS-11 at its worse: 5/10, NAS-11 at its best: 5/10, NAS-11 now: 5/10 and NAS-11 on the average: 5/10 Timing: During activity or exercise and After activity or exercise Aggravating Factors: Bending, Climbing, Intercourse (sex), Lifiting, Prolonged standing, Squatting, Stooping , Twisting, Walking, Walking uphill and Walking downhill Alleviating Factors: Resting Associated Problems: Numbness, Spasms, Tingling and Pain that wakes patient up Quality of Pain: Aching, Intermittent, Deep, Dull, Sharp, Shooting, Stabbing and Toothache-like Previous Examinations or Tests: Biopsy Previous Treatments: Physical Therapy  Elizabeth [name] is a very pleasant 55 year old female who presents with a chief complaint of low back pain that radiates into bilateral buttocks and bilateral legs usually down to her knee in a dermatomal fashion.  This is been going on for over 3 years.  No inciting or traumatic event.  She has done physical therapy on 2 occasions in the past with limited response.  She tries to do those exercises at home.  Her last round of physical therapy was less than a year ago.  She has been on gabapentin for many years.  She is unclear if this helps with her pain.  She has tried a muscle relaxer in the past.  She cannot recall the name.  Thinks it is methocarbamol.  No significant benefit with it.  Patient has not had any imaging studies of her back hips SI joint or knees.  She denies having any injections done in  the past.  She denies any bowel or bladder dysfunction.  She states that after she has a bowel  movement, sometimes her back pain is better.  Of note, she has lost weight in the last month.  She is trying to diet and eat better.  Meds   Current Outpatient Medications:  .  aspirin 81 MG tablet, Take 81 mg by mouth daily., Disp: , Rfl:  .  atorvastatin (LIPITOR) 10 MG tablet, Take 1 tablet (10 mg total) by mouth at bedtime., Disp: 90 tablet, Rfl: 3 .  azelastine (ASTELIN) 0.1 % nasal spray, Place 2 sprays into both nostrils 2 (two) times daily. Use in each nostril as directed, Disp: 30 mL, Rfl: 12 .  B-D INS SYR ULTRAFINE 1CC/31G 31G X 5/16" 1 ML MISC, , Disp: , Rfl: 1 .  Blood Glucose Monitoring Suppl (ACCU-CHEK NANO SMARTVIEW) w/Device KIT, Use as directed once daily. LON 99, E11.9, Disp: 1 kit, Rfl: 0 .  cholecalciferol (VITAMIN D) 1000 units tablet, Take 2,000 Units by mouth daily. , Disp: , Rfl:  .  clobetasol ointment (TEMOVATE) 0.05 %, APPLY EXTERNALLY TO THE AFFECTED AREA TWICE DAILY, Disp: , Rfl:  .  empagliflozin (JARDIANCE) 10 MG TABS tablet, Take 1 tablet (10 mg total) by mouth daily., Disp: 90 tablet, Rfl: 3 .  folic acid (FOLVITE) 1 MG tablet, Take 1 mg by mouth daily., Disp: , Rfl:  .  gabapentin (NEURONTIN) 300 MG capsule, TAKE 1 CAPSULE BY MOUTH EVERY MORNING AND TAKE 1 AT NOON AND TAKE 2 CAPSULES AT BEDTIME, Disp: 360 capsule, Rfl: 1 .  glucose blood (ACCU-CHEK GUIDE) test strip, AS DIRECTED, Disp: 50 strip, Rfl: 5 .  glucose blood (ACCU-CHEK SMARTVIEW) test strip, USE TO TEST ONCE DAILY, Disp: 100 strip, Rfl: 6 .  linaclotide (LINZESS) 290 MCG CAPS capsule, Take 1 capsule (290 mcg total) by mouth daily before breakfast., Disp: 90 capsule, Rfl: 0 .  metFORMIN (GLUCOPHAGE-XR) 500 MG 24 hr tablet, Take 2 tablets (1,000 mg total) by mouth 2 (two) times daily., Disp: 360 tablet, Rfl: 1 .  Methotrexate 25 MG/ML SOSY, INJECT 0.6 MILLILITERS SUBCUTANEOUSLY EVERY WEEK FOR 12 WEEKS, Disp: , Rfl:  .  Methotrexate Sodium (METHOTREXATE, PF,) 50 MG/2ML injection, 15 mg once a week.,  Disp: , Rfl:  .  Multiple Vitamins-Minerals (MULTIVITAMIN/EXTRA VITAMIN D3 PO), Take by mouth., Disp: , Rfl:  .  Multiple Vitamins-Minerals (ZINC PO), Take by mouth., Disp: , Rfl:  .  NASAL ALLERGY 24 HOUR 55 MCG/ACT AERO nasal inhaler, 2 sprays daily., Disp: , Rfl:  .  OLANZapine (ZYPREXA) 15 MG tablet, Take 15 mg by mouth at bedtime., Disp: , Rfl:  .  Omega-3 Fatty Acids (FISH OIL PO), Take by mouth., Disp: , Rfl:  .  OVER THE COUNTER MEDICATION, once daily Herbal Name: Goli, Disp: , Rfl:  .  tiZANidine (ZANAFLEX) 4 MG tablet, Take 1 tablet (4 mg total) by mouth at bedtime as needed for muscle spasms., Disp: 30 tablet, Rfl: 1 .  traZODone (DESYREL) 100 MG tablet, Take 200 mg by mouth at bedtime as needed., Disp: , Rfl:  .  XIIDRA 5 % SOLN, Place 1 drop into both eyes 2 (two) times daily., Disp: , Rfl:  .  Accu-Chek FastClix Lancets MISC, USE TO CHECK BLOOD SUGAR TWICE DAILY (Patient not taking: No sig reported), Disp: 100 each, Rfl: 5 .  Accu-Chek Softclix Lancets lancets, Use as instructed (Patient not taking: No sig reported), Disp: 100 each, Rfl: 12  ROS  Cardiovascular: Daily Aspirin intake Pulmonary or Respiratory: No reported pulmonary signs or symptoms such as wheezing and difficulty taking a deep full breath (Asthma), difficulty blowing air out (Emphysema), coughing up mucus (Bronchitis), persistent dry cough, or temporary stoppage of breathing during sleep Neurological: No reported neurological signs or symptoms such as seizures, abnormal skin sensations, urinary and/or fecal incontinence, being born with an abnormal open spine and/or a tethered spinal cord Psychological-Psychiatric: Anxiousness, Depressed and Prone to panicking Gastrointestinal: Reflux or heatburn, Alternating episodes iof diarrhea and constipation (IBS-Irritable bowe syndrome) and Inflamed liver (Hepatitis) Genitourinary: No reported renal or genitourinary signs or symptoms such as difficulty voiding or producing  urine, peeing blood, non-functioning kidney, kidney stones, difficulty emptying the bladder, difficulty controlling the flow of urine, or chronic kidney disease Hematological: Brusing easily Endocrine: No reported endocrine signs or symptoms such as high or low blood sugar, rapid heart rate due to high thyroid levels, obesity or weight gain due to slow thyroid or thyroid disease Rheumatologic: Joint aches and or swelling due to excess weight (Osteoarthritis) and Rheumatoid arthritis Musculoskeletal: Negative for myasthenia gravis, muscular dystrophy, multiple sclerosis or malignant hyperthermia Work History: Disabled  Allergies  Elizabeth [name] is allergic to ace inhibitors, enalapril, penicillins, and statins.  Laboratory Chemistry Profile   Renal Lab Results  Component Value Date   BUN 11 01/31/2021   CREATININE 0.80 01/31/2021   LABCREA 162 63/84/6659   BCR NOT APPLICABLE 93/57/0177   GFRAA 96 09/07/2020   GFRNONAA 83 09/07/2020   SPECGRAV 1.020 02/05/2016   PHUR 6.0 02/05/2016   PROTEINUR NEGATIVE 09/07/2020     Electrolytes Lab Results  Component Value Date   NA 137 01/31/2021   K 4.4 01/31/2021   CL 104 01/31/2021   CALCIUM 9.5 01/31/2021     Hepatic Lab Results  Component Value Date   AST 22 09/07/2020   ALT 40 (H) 09/07/2020   ALBUMIN 4.1 04/01/2017   ALKPHOS 102 04/01/2017   LIPASE 13 09/07/2020     ID Lab Results  Component Value Date   HIV Non Reactive 11/08/2015   SARSCOV2NAA NEGATIVE 05/12/2020   STAPHAUREUS NEGATIVE 01/16/2016   MRSAPCR NEGATIVE 01/16/2016   PREGTESTUR NEGATIVE 12/05/2017     Bone Lab Results  Component Value Date   VD25OH 34 07/02/2017     Endocrine Lab Results  Component Value Date   GLUCOSE 174 (H) 01/31/2021   GLUCOSEU TRACE (A) 09/07/2020   HGBA1C 9.2 (A) 01/31/2021   TSH 1.11 09/07/2020     Neuropathy Lab Results  Component Value Date   HGBA1C 9.2 (A) 01/31/2021   HIV Non Reactive 11/08/2015     CNS No results  found for: COLORCSF, APPEARCSF, RBCCOUNTCSF, WBCCSF, POLYSCSF, LYMPHSCSF, EOSCSF, PROTEINCSF, GLUCCSF, JCVIRUS, CSFOLI, IGGCSF, LABACHR, ACETBL, LABACHR, ACETBL   Inflammation (CRP: Acute  ESR: Chronic) No results found for: CRP, ESRSEDRATE, LATICACIDVEN   Rheumatology No results found for: RF, ANA, LABURIC, URICUR, LYMEIGGIGMAB, LYMEABIGMQN, HLAB27   Coagulation Lab Results  Component Value Date   PLT 268 09/07/2020     Cardiovascular Lab Results  Component Value Date   TROPONINI <0.03 05/07/2015   HGB 12.3 09/07/2020   HCT 37.2 09/07/2020     Screening Lab Results  Component Value Date   SARSCOV2NAA NEGATIVE 05/12/2020   STAPHAUREUS NEGATIVE 01/16/2016   MRSAPCR NEGATIVE 01/16/2016   HIV Non Reactive 11/08/2015   PREGTESTUR NEGATIVE 12/05/2017     Cancer No results found for: CEA, CA125, LABCA2   Allergens No results found for:  ALMOND, APPLE, ASPARAGUS, AVOCADO, BANANA, BARLEY, BASIL, BAYLEAF, GREENBEAN, LIMABEAN, WHITEBEAN, BEEFIGE, REDBEET, BLUEBERRY, BROCCOLI, CABBAGE, MELON, CARROT, CASEIN, CASHEWNUT, CAULIFLOWER, CELERY     Note: Lab results reviewed.  [place]  Drug: Elizabeth [name]  reports no history of drug use. Alcohol:  reports current alcohol use. Tobacco:  reports that she quit smoking about 5 years ago. Her smoking use included cigarettes. She has a 1.00 pack-year smoking history. She has never used smokeless tobacco. Medical:  has a past medical history of Bipolar 1 disorder ([place]), Depression, Diabetes mellitus without complication ([place]), Diverticulosis, GERD (gastroesophageal reflux disease), Heart murmur, Hepatitis, Hypertension goal BP (blood pressure) < 130/80 (01/17/2016), Insomnia, Irritable bowel syndrome, Motion sickness, Obesity (BMI 30-39.9) (07/03/2015), RA (rheumatoid arthritis) ([place]), and Sciatica. Family: family history includes Alcohol abuse in her father; Aneurysm in her maternal grandfather; Arthritis/Rheumatoid in her mother; Dementia in her maternal  grandmother; Depression in her daughter and daughter; Diabetes in her brother, maternal grandmother, mother, and paternal grandmother; Diverticulosis in her maternal aunt and maternal grandmother; Esophageal varices in her father; Healthy in her brother; Heart disease in her maternal grandmother; Hyperlipidemia in her mother; Hypertension in her mother; Kidney disease in her brother.  Past Surgical History:  Procedure Laterality Date  . BOWEL RESECTION    . BREAST BIOPSY Right 12/22/2019   affirm bx x marker, papilloma   . BREAST BIOPSY    . CHOLECYSTECTOMY  1997   [place]  . COLONOSCOPY WITH PROPOFOL N/A 09/07/2015   Procedure: COLONOSCOPY WITH PROPOFOL;  Surgeon: Lucilla Lame, MD;  Location: [facility];  Service: Endoscopy;  Laterality: N/A;  . COLONOSCOPY WITH PROPOFOL N/A 05/16/2020   Procedure: COLONOSCOPY WITH PROPOFOL;  Surgeon: Lucilla Lame, MD;  Location: [facility] END[facility]COPY;  Service: Endoscopy;  Laterality: N/A;  . ESOPHAGOGASTRODUODENOSCOPY (EGD) WITH ESOPHAGEAL DILATION    . ESOPHAGOGASTRODUODENOSCOPY (EGD) WITH PROPOFOL N/A 09/07/2015   Procedure: ESOPHAGOGASTRODUODENOSCOPY (EGD) WITH PROPOFOL;  Surgeon: Lucilla Lame, MD;  Location: [place];  Service: Endoscopy;  Laterality: N/A;  . PARTIAL COLECTOMY N/A 01/24/2016   Procedure: SIGMOID COLECTOMY;  Surgeon: Florene Glen, MD;  Location: [facility];  Service: General;  Laterality: N/A;  . POLYPECTOMY N/A 09/07/2015   Procedure: POLYPECTOMY;  Surgeon: Lucilla Lame, MD;  Location: [place];  Service: Endoscopy;  Laterality: N/A;  . TUBAL LIGATION     Active Ambulatory Problems    Diagnosis Date Noted  . Bipolar disorder, current episode depressed, moderate ([place]) 07/03/2015  . IBS (irritable bowel syndrome) 07/03/2015  . Insomnia, uncontrolled 07/03/2015  . GERD without esophagitis 07/03/2015  . Bilateral hip bursitis 07/03/2015  . Benign neoplasm of descending colon   . Benign neoplasm of sigmoid colon    . Type 2 diabetes mellitus with hyperglycemia, without long-term current use of insulin ([place]) 10/23/2015  . Rheumatoid arthritis involving both hands ([place]) 10/23/2015  . Inversion, nipple 11/08/2015  . Bursitis, trochanteric 11/22/2015  . Hypertension goal BP (blood pressure) < 130/80 01/17/2016  . Diverticula of colon 01/24/2016  . Bilateral carpal tunnel syndrome 01/03/2016  . Hyperlipidemia LDL goal <70 10/04/2016  . Vaginal atrophy 01/01/2017  . Immunosuppressed status ([place]) 07/01/2019  . Morbid obesity ([place]) 07/01/2019  . Psoriasis 08/06/2019  . Intraductal papilloma of breast, right 12/28/2019  . 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 Dyshidrotic eczema 01/07/2020  . Chronic bilateral low back pain with bilateral sciatica 01/20/2020  . Chronic rhinitis 04/18/2020  . Personal history of colonic polyps   . Polyp of  sigmoid colon   . Chronic radicular lumbar pain 03/15/2021  . Sacroiliac joint pain 03/15/2021  . Bilateral hip pain 03/15/2021   Resolved Ambulatory Problems    Diagnosis Date Noted  . Diverticulitis large intestine 05/07/2015  . Chronic rheumatic arthritis ([place]) 07/03/2015  . Obesity (BMI 30-39.9) 07/03/2015  . Trouble swallowing   . Special screening for malignant neoplasms, colon   . Elevated liver enzymes 10/23/2015  . RA (rheumatoid arthritis) ([place]) 10/23/2015  . Screening for STD (sexually transmitted disease) 10/23/2015  . HIV exposure 10/23/2015  . Breast mass, right 11/08/2015  . Annual physical exam 11/29/2015  . Encounter for screening for malignant neoplasm of cervix 11/29/2015  . Need for diphtheria-tetanus-pertussis (Tdap) vaccine, adult/adolescent 11/29/2015  . Screening for tuberculosis 11/29/2015  . Burning with urination 02/05/2016  . Acute renal failure (ARF) ([place]) 02/11/2016  . Wound healing, delayed 02/16/2016  . Rheumatoid arthritis involving multiple joints ([place]) 11/22/2015  . Vitamin D deficiency 03/11/2016  .  Medication monitoring encounter 03/16/2016  . Abnormal weight gain 09/09/2016  . Obesity, Class II, BMI 35-39.9 10/09/2017  . History of recent steroid use 10/15/2017  . Screening for cervical cancer 05/26/2018  . Preventative health care 05/26/2018  . Obesity (BMI 30.0-34.9) 09/29/2018   Past Medical History:  Diagnosis Date  . Bipolar 1 disorder ([place])   . Depression   . Diabetes mellitus without complication ([place])   . Diverticulosis   . GERD (gastroesophageal reflux disease)   . Heart murmur   . Hepatitis   . Insomnia   . Irritable bowel syndrome   . Motion sickness   . Sciatica    Constitutional Exam  General appearance: Well nourished, well developed, and well hydrated. In no apparent acute distress Vitals:   03/15/21 1012  BP: 123/80  Pulse: 70  Resp: 16  Temp: (!) 97.2 F (36.2 C)  TempSrc: Temporal  SpO2: 100%  Weight: 236 lb 14.4 oz (107.5 kg)  Height: '5\' 6"'  (1.676 m)   BMI Assessment: Estimated body mass index is 38.24 kg/m as calculated from the following:   Height as of this encounter: '5\' 6"'  (1.676 m).   Weight as of this encounter: 236 lb 14.4 oz (107.5 kg).  BMI interpretation table: BMI level Category Range association with higher incidence of chronic pain  <18 kg/m2 Underweight   18.5-24.9 kg/m2 Ideal body weight   25-29.9 kg/m2 Overweight Increased incidence by 20%  30-34.9 kg/m2 Obese (Class I) Increased incidence by 68%  35-39.9 kg/m2 Severe obesity (Class II) Increased incidence by 136%  >40 kg/m2 Extreme obesity (Class III) Increased incidence by 254%   Patient's current BMI Ideal Body weight  Body mass index is 38.24 kg/m. Ideal body weight: 59.3 kg (130 lb 11.7 oz) Adjusted ideal body weight: 78.6 kg (173 lb 3.2 oz)   BMI Readings from Last 4 Encounters:  03/15/21 38.24 kg/m  02/19/21 38.58 kg/m  01/31/21 39.90 kg/m  10/19/20 40.80 kg/m   Wt Readings from Last 4 Encounters:  03/15/21 236 lb 14.4 oz (107.5 kg)  02/19/21 239 lb  (108.4 kg)  01/31/21 247 lb 3.2 oz (112.1 kg)  10/19/20 252 lb 12.8 oz (114.7 kg)    Psych/Mental status: Alert, oriented x 3 (person, place, & time)       Eyes: PERLA Respiratory: No evidence of acute respiratory distress  Cervical Spine Exam  Skin & Axial Inspection: No masses, redness, edema, swelling, or associated skin lesions Alignment: Symmetrical Functional ROM: Unrestricted ROM  Stability: No instability detected Muscle Tone/Strength: Functionally intact. No obvious neuro-muscular anomalies detected. Sensory (Neurological): Unimpaired Palpation: No palpable anomalies              Upper Extremity (UE) Exam    Side: Right upper extremity  Side: Left upper extremity  Skin & Extremity Inspection: Skin color, temperature, and hair growth are WNL. No peripheral edema or cyanosis. No masses, redness, swelling, asymmetry, or associated skin lesions. No contractures.  Skin & Extremity Inspection: Skin color, temperature, and hair growth are WNL. No peripheral edema or cyanosis. No masses, redness, swelling, asymmetry, or associated skin lesions. No contractures.  Functional ROM: Unrestricted ROM          Functional ROM: Unrestricted ROM          Muscle Tone/Strength: Functionally intact. No obvious neuro-muscular anomalies detected.   Muscle Tone/Strength: Functionally intact. No obvious neuro-muscular anomalies detected.  Sensory (Neurological): Unimpaired          Sensory (Neurological): Unimpaired          Palpation: No palpable anomalies              Palpation: No palpable anomalies              Provocative Test(s):  Phalen's test: deferred Tinel's test: deferred Apley's scratch test (touch opposite shoulder):  Action 1 (Across chest): deferred Action 2 (Overhead): deferred Action 3 (LB reach): deferred   Provocative Test(s):  Phalen's test: deferred Tinel's test: deferred Apley's scratch test (touch opposite shoulder):  Action 1 (Across chest): deferred Action 2  (Overhead): deferred Action 3 (LB reach): deferred    Thoracic Spine Area Exam  Skin & Axial Inspection: No masses, redness, or swelling Alignment: Symmetrical Functional ROM: Unrestricted ROM Stability: No instability detected Muscle Tone/Strength: Functionally intact. No obvious neuro-muscular anomalies detected. Sensory (Neurological): Unimpaired Muscle strength & Tone: No palpable anomalies Carnett's test*: deferred (*Carnett's sign is a clinical examination finding that is useful for confirming whether pain originates from the abdominal viscera or from the abdominal wall. During testing for Carnett's sign, the investigator identifies the point of maximal abdominal pain by deeply palpating with a finger; the patient is then asked to tense the abdominal muscles while the fingertip is released, followed again by deep palpation. If both stages of the test are painful, the source of the pain is the abdominal wall. In contrast, pain originating from the abdominal organs is associated with just a painful first stage) Lumbar Exam  Skin & Axial Inspection: No masses, redness, or swelling Alignment: Symmetrical Functional ROM: Pain restricted ROM       Stability: No instability detected Muscle Tone/Strength: Functionally intact. No obvious neuro-muscular anomalies detected. Sensory (Neurological): Dermatomal pain pattern and musculoskeletal  Gait & Posture Assessment  Ambulation: Unassisted Gait: Relatively normal for age and body habitus Posture: WNL   Lower Extremity Exam    Side: Right lower extremity  Side: Left lower extremity  Stability: No instability observed          Stability: No instability observed          Skin & Extremity Inspection: Skin color, temperature, and hair growth are WNL. No peripheral edema or cyanosis. No masses, redness, swelling, asymmetry, or associated skin lesions. No contractures.  Skin & Extremity Inspection: Skin color, temperature, and hair growth are WNL.  No peripheral edema or cyanosis. No masses, redness, swelling, asymmetry, or associated skin lesions. No contractures.  Functional ROM: Pain restricted ROM  Limited SLR (straight leg raise)  Functional ROM: Pain restricted ROM for all joints of the lower extremity Limited SLR (straight leg raise)  Muscle Tone/Strength: Functionally intact. No obvious neuro-muscular anomalies detected.  Muscle Tone/Strength: Functionally intact. No obvious neuro-muscular anomalies detected.  Sensory (Neurological): Unimpaired        Sensory (Neurological): Unimpaired        DTR: Patellar: deferred today Achilles: deferred today Plantar: deferred today  DTR: Patellar: deferred today Achilles: deferred today Plantar: deferred today  Palpation: No palpable anomalies  Palpation: No palpable anomalies   Assessment  Primary Diagnosis & Pertinent Problem List: The primary encounter diagnosis was Chronic bilateral low back pain with bilateral sciatica. Diagnoses of Chronic radicular lumbar pain, Sacroiliac joint pain, Bilateral hip pain, Chronic pain syndrome, and Chronic pain of both knees were also pertinent to this visit.  Visit Diagnosis (New problems to examiner): 1. Chronic bilateral low back pain with bilateral sciatica   2. Chronic radicular lumbar pain   3. Sacroiliac joint pain   4. Bilateral hip pain   5. Chronic pain syndrome   6. Chronic pain of both knees    General Recommendations: The pain condition that the patient suffers from is best treated with a multidisciplinary approach that involves an increase in physical activity to prevent de-conditioning and worsening of the pain cycle, as well as psychological counseling (formal and/or informal) to address the co-morbid psychological affects of pain. Treatment will often involve judicious use of pain medications and interventional procedures to decrease the pain, allowing the patient to participate in the physical activity that will ultimately  produce long-lasting pain reductions. The goal of the multidisciplinary approach is to return the patient to a higher level of overall function and to restore their ability to perform activities of daily living.  1.  X-ray studies as below.  Patient does not have any imaging on file. 2.  We will likely need lumbar MRI to evaluate any foraminal stenosis that could be contributing to her radicular pain symptoms. 3.  Consider diagnostic lumbar epidural steroid injection for lumbar radicular pain 4.  Continue with dieting and healthy eating 5.  Continue gabapentin for the time being. 6.  Start tizanidine as below nightly as needed. 7.  Follow-up in 4 weeks.  Plan of Care (Initial workup plan)   Imaging Orders     DG Knee 1-2 Views Right     DG Knee 1-2 Views Left     DG HIP UNILAT W OR W/O PELVIS 2-3 VIEWS RIGHT     DG HIP UNILAT W OR W/O PELVIS 2-3 VIEWS LEFT     DG Lumbar Spine Complete W/Bend     DG Si Joints  Pharmacotherapy (current): Medications ordered:  Meds ordered this encounter  Medications  . tiZANidine (ZANAFLEX) 4 MG tablet    Sig: Take 1 tablet (4 mg total) by mouth at bedtime as needed for muscle spasms.    Dispense:  30 tablet    Refill:  1   Medications administered during this visit: Elizabeth [name] had no medications administered during this visit.   Pharmacological management options:  Opioid Analgesics: The patient was informed that there is no guarantee that she would be a candidate for opioid analgesics. The decision will be made following CDC guidelines. This decision will be based on the results of diagnostic studies, as well as Elizabeth [name]'s risk profile.   Membrane stabilizer: Continue with gabapentin.  Future considerations include Lyrica, Cymbalta  Muscle relaxant: Trial of  tizanidine  NSAID: To be determined at a later time  Other analgesic(s): To be determined at a later time   Interventional management options: Elizabeth [name] was informed that there  is no guarantee that she would be a candidate for interventional therapies. The decision will be based on the results of diagnostic studies, as well as Elizabeth [name]'s risk profile.  Procedure(s) under consideration:  Pending imaging studies.   Provider-requested follow-up: Return in about 4 weeks (around 04/12/2021) for Medication Management, in person.  Future Appointments  Date Time Provider [place]  04/03/2021  9:20 AM [place] ADVISOR [place]  04/10/2021  2:20 PM Gillis Santa, MD [facility] None    Note by: Gillis Santa, MD Date: 03/15/2021; Time: 11:26 AM

## 2021-03-15 NOTE — Patient Instructions (Signed)
Tizanidine has been escribed to your pharmacy.  You will have x rays completed prior to next appt with pain clinic.

## 2021-03-15 NOTE — Progress Notes (Signed)
Safety precautions to be maintained throughout the outpatient stay will include: orient to surroundings, keep bed in low position, maintain call bell within reach at all times, provide assistance with transfer out of bed and ambulation.  

## 2021-03-21 ENCOUNTER — Other Ambulatory Visit: Payer: Self-pay | Admitting: Family Medicine

## 2021-03-21 ENCOUNTER — Telehealth: Payer: Self-pay | Admitting: Family Medicine

## 2021-03-21 NOTE — Telephone Encounter (Signed)
Copied from [place] [id number]. Topic: Quick Communication - See Telephone Encounter >> Mar 21, 2021 10:09 AM Elenora Fender, Laverda Sorenson, [place] wrote: CRM for notification. See Telephone encounter for: 03/21/21. >> Mar 21, 2021 10:10 AM Elenora Fender, Laverda Sorenson, [place] wrote:

## 2021-04-03 ENCOUNTER — Ambulatory Visit: Payer: Medicare Other

## 2021-04-06 ENCOUNTER — Other Ambulatory Visit: Payer: Self-pay

## 2021-04-06 DIAGNOSIS — E1165 Type 2 diabetes mellitus with hyperglycemia: Secondary | ICD-10-CM

## 2021-04-06 MED ORDER — METFORMIN HCL ER 500 MG PO TB24: 1000.0000 mg | ORAL_TABLET | Freq: Two times a day (BID) | ORAL | 3 refills | Status: DC

## 2021-04-10 ENCOUNTER — Other Ambulatory Visit: Payer: Self-pay

## 2021-04-10 ENCOUNTER — Ambulatory Visit
Payer: Medicare Other | Attending: Student in an Organized Health Care Education/Training Program | Admitting: Student in an Organized Health Care Education/Training Program

## 2021-04-10 ENCOUNTER — Encounter: Payer: Self-pay | Admitting: Student in an Organized Health Care Education/Training Program

## 2021-04-10 VITALS — BP 125/85 | HR 83 | Temp 97.2°F | Resp 20 | Ht 66.0 in | Wt 239.0 lb

## 2021-04-10 DIAGNOSIS — G8929 Other chronic pain: Secondary | ICD-10-CM | POA: Diagnosis present

## 2021-04-10 DIAGNOSIS — M5416 Radiculopathy, lumbar region: Secondary | ICD-10-CM | POA: Diagnosis present

## 2021-04-10 DIAGNOSIS — M5441 Lumbago with sciatica, right side: Secondary | ICD-10-CM | POA: Diagnosis present

## 2021-04-10 DIAGNOSIS — M5442 Lumbago with sciatica, left side: Secondary | ICD-10-CM

## 2021-04-10 DIAGNOSIS — G894 Chronic pain syndrome: Secondary | ICD-10-CM | POA: Diagnosis not present

## 2021-04-10 DIAGNOSIS — M5136 Other intervertebral disc degeneration, lumbar region: Secondary | ICD-10-CM | POA: Diagnosis present

## 2021-04-10 NOTE — Progress Notes (Signed)
PROVIDER NOTE: Information contained herein reflects review and annotations entered in association with encounter. Interpretation of such information and data should be left to medically-trained personnel. Information provided to patient can be located elsewhere in the medical record under "Patient Instructions". Document created using STT-dictation technology, any transcriptional errors that may result from process are unintentional.    Patient: Elizabeth [name]  Service Category: E/M  Provider: Gillis Santa, MD  DOB: [birth year]  DOS: 04/10/2021  Specialty: Interventional Pain Management  MRN: [id number]  Setting: Ambulatory outpatient  PCP: Myles Gip, DO  Type: Established Patient    Referring Provider: Myles Gip, DO  Location: Office  Delivery: Face-to-face     HPI  Ms. Elizabeth [name], a 55 y.o. year old female, is here today because of her Chronic bilateral low back pain with bilateral sciatica [M54.42, M54.41, G89.29]. [name] primary complain today is Back Pain (low) Last encounter: My last encounter with her was on 03/15/2021. Pain Assessment: Severity of Chronic pain is reported as a 4 /10. Location: Back Lower/radiates down bilateral legs to back of knee. Onset: More than a month ago. Quality: Squeezing. Timing: Constant. Modifying factor(s): BM helps. Vitals:  height is '5\' 6"'  (1.676 m) and weight is 239 lb (108.4 kg). Her temperature is 97.2 F (36.2 C) (abnormal). Her blood pressure is 125/85 and her pulse is 83. Her respiration is 20 and oxygen saturation is 99%.   Reason for encounter: follow-up evaluation to review x-ray results.  Patient continues to have persistent low back pain with radiation into her tailbone.  She also has occasional radiation into her posterior lateral thigh stopping usually at her knee.  Her lumbar spine x-ray was largely unremarkable.  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  Given persistent low back  pain that radiates to her bilateral lower extremity in a dermatomal fashion, recommend follow-up imaging with lumbar MRI without contrast.  Depending upon results can consider lumbar epidural steroid injection if she has any neuroforaminal stenosis or evidence of radiculitis.  If lumbar MRI is negative, likely musculoskeletal and will discuss treatment options for myofascial/musculoskeletal pain.  At her last visit, she was prescribed tizanidine and she is noticing some benefit with it.  I instructed her to continue.  She will follow-up with me in 1 month after she has completed her lumbar MRI to review results and discuss treatment plan.   ROS  Constitutional: Denies any fever or chills Gastrointestinal: No reported hemesis, hematochezia, vomiting, or acute GI distress Musculoskeletal: +LBP Neurological: No reported episodes of acute onset apraxia, aphasia, dysarthria, agnosia, amnesia, paralysis, loss of coordination, or loss of consciousness  Medication Review  Accu-Chek FastClix Lancets, Accu-Chek Nano SmartView, Accu-Chek Softclix Lancets, Insulin Syringe-Needle U-100, Lifitegrast, Methotrexate, Multiple Vitamins-Minerals, OLANZapine, OVER THE COUNTER MEDICATION, Omega-3 Fatty Acids, aspirin, atorvastatin, azelastine, cholecalciferol, clobetasol ointment, empagliflozin, folic acid, gabapentin, glucose blood, linaclotide, metFORMIN, methotrexate (PF), tiZANidine, traZODone, and triamcinolone  History Review  Allergy: [name] is allergic to ace inhibitors, enalapril, penicillins, and statins. Drug: [name]  reports no history of drug use. Alcohol:  reports current alcohol use. Tobacco:  reports that she quit smoking about 5 years ago. Her smoking use included cigarettes. She has a 1.00 pack-year smoking history. She has never used smokeless tobacco. Social: Ms. Mcneff  reports that she quit smoking about 5 years ago. Her smoking use included cigarettes. She has a 1.00 pack-year smoking  history. She has never used smokeless tobacco.  She reports current alcohol use. She reports that she does not use drugs. Medical:  has a past medical history of Bipolar 1 disorder ([place]), Depression, Diabetes mellitus without complication ([place]), Diverticulosis, GERD (gastroesophageal reflux disease), Heart murmur, Hepatitis, Hypertension goal BP (blood pressure) < 130/80 (01/17/2016), Insomnia, Irritable bowel syndrome, Motion sickness, Obesity (BMI 30-39.9) (07/03/2015), RA (rheumatoid arthritis) ([place]), and Sciatica. Surgical: [name]  has a past surgical history that includes Tubal ligation; Esophagogastroduodenoscopy (egd) with esophageal dilation; Cholecystectomy (1997); Colonoscopy with propofol (N/A, 09/07/2015); Esophagogastroduodenoscopy (egd) with propofol (N/A, 09/07/2015); polypectomy (N/A, 09/07/2015); Partial colectomy (N/A, 01/24/2016); Bowel resection; Colonoscopy with propofol (N/A, 05/16/2020); Breast biopsy (Right, 12/22/2019); and Breast biopsy. Family: family history includes Alcohol abuse in her father; Aneurysm in her maternal grandfather; Arthritis/Rheumatoid in her mother; Dementia in her maternal grandmother; Depression in her daughter and daughter; Diabetes in her brother, maternal grandmother, mother, and paternal grandmother; Diverticulosis in her maternal aunt and maternal grandmother; Esophageal varices in her father; Healthy in her brother; Heart disease in her maternal grandmother; Hyperlipidemia in her mother; Hypertension in her mother; Kidney disease in her brother.  Laboratory Chemistry Profile   Renal Lab Results  Component Value Date   BUN 11 01/31/2021   CREATININE 0.80 01/31/2021   LABCREA 162 48/54/6270   BCR NOT APPLICABLE 35/00/9381   GFRAA 96 09/07/2020   GFRNONAA 83 09/07/2020     Hepatic Lab Results  Component Value Date   AST 22 09/07/2020   ALT 40 (H) 09/07/2020   ALBUMIN 4.1 04/01/2017   ALKPHOS 102 04/01/2017   LIPASE 13 09/07/2020      Electrolytes Lab Results  Component Value Date   NA 137 01/31/2021   K 4.4 01/31/2021   CL 104 01/31/2021   CALCIUM 9.5 01/31/2021     Bone Lab Results  Component Value Date   VD25OH 34 07/02/2017     Inflammation (CRP: Acute Phase) (ESR: Chronic Phase) No results found for: CRP, ESRSEDRATE, LATICACIDVEN     Note: Above Lab results reviewed.  Recent Imaging Review  DG Si Joints CLINICAL DATA:  Pain.  EXAM: BILATERAL SACROILIAC JOINTS - 3+ VIEW  COMPARISON:  None.  FINDINGS: Minimal degenerative changes in the inferior SI joints. No evidence of sacroiliitis. No fractures.  IMPRESSION: Minimal degenerative changes in the inferior SI joints. No other abnormalities.  Electronically Signed   By: Dorise Bullion III M.D   On: 03/17/2021 08:10 DG Lumbar Spine Complete W/Bend CLINICAL DATA:  Low back pain.  EXAM: LUMBAR SPINE - COMPLETE WITH BENDING VIEWS  COMPARISON:  None.  FINDINGS: There is no evidence of lumbar spine fracture. Alignment is normal. Intervertebral disc spaces are maintained.  IMPRESSION: Negative.  Electronically Signed   By: Dorise Bullion III M.D   On: 03/17/2021 08:09 DG HIP UNILAT W OR W/O PELVIS 2-3 VIEWS LEFT CLINICAL DATA:  Pain  EXAM: DG HIP (WITH OR WITHOUT PELVIS) 2-3V LEFT  COMPARISON:  None.  FINDINGS: Mild degenerative changes in the left hip without bony sclerosis or loss of joint space. No other abnormalities.  IMPRESSION: Mild degenerative changes in the left hip without bony sclerosis or loss of joint space. No other abnormalities.  Electronically Signed   By: Dorise Bullion III M.D   On: 03/17/2021 08:08 DG HIP UNILAT W OR W/O PELVIS 2-3 VIEWS RIGHT CLINICAL DATA:  Pain.  EXAM: DG HIP (WITH OR WITHOUT PELVIS) 2-3V RIGHT  COMPARISON:  None.  FINDINGS: Mild degenerative changes in the right hip without evidence of  joint space loss or bony sclerosis. No other abnormalities.  IMPRESSION: Very mild  degenerative changes in the right hip without loss of joint space or bony sclerosis. No other abnormalities.  Electronically Signed   By: Dorise Bullion III M.D   On: 03/17/2021 08:07 DG Knee 1-2 Views Left CLINICAL DATA:  Pain.  EXAM: LEFT KNEE - 1-2 VIEW  COMPARISON:  None.  FINDINGS: Mild enthesopathic changes at the superior patella. Minimal degenerative changes in the patellofemoral compartment and mild degenerative change in the medial compartment. No other abnormalities.  IMPRESSION: 1. Mild degenerative changes as above. 2. Mild enthesopathic changes associated with the superior patella as above.  Electronically Signed   By: Dorise Bullion III M.D   On: 03/17/2021 08:06 DG Knee 1-2 Views Right CLINICAL DATA:  Right knee pain.  EXAM: RIGHT KNEE - 1-2 VIEW  COMPARISON:  None.  FINDINGS: Mild tricompartmental degenerative changes. Enthesopathic changes at the superior patella. No fracture or effusion. Probable soft tissue calcification medially adjacent to the distal femur is likely evidence of a previous medial collateral ligament injury. No acute abnormalities.  IMPRESSION: 1. Tricompartmental degenerative changes. 2. Possible remote medial collateral ligament injury.  Electronically Signed   By: Dorise Bullion III M.D   On: 03/17/2021 08:05 Note: Reviewed        Physical Exam  General appearance: Well nourished, well developed, and well hydrated. In no apparent acute distress Mental status: Alert, oriented x 3 (person, place, & time)       Respiratory: No evidence of acute respiratory distress Eyes: PERLA Vitals: BP 125/85   Pulse 83   Temp (!) 97.2 F (36.2 C)   Resp 20   Ht '5\' 6"'  (1.676 m)   Wt 239 lb (108.4 kg)   LMP 07/09/2014   SpO2 99%   BMI 38.58 kg/m  BMI: Estimated body mass index is 38.58 kg/m as calculated from the following:   Height as of this encounter: '5\' 6"'  (1.676 m).   Weight as of this encounter: 239 lb (108.4  kg). Ideal: Ideal body weight: 59.3 kg (130 lb 11.7 oz) Adjusted ideal body weight: 78.9 kg (174 lb 0.6 oz)  Lumbar Exam  Skin & Axial Inspection: No masses, redness, or swelling Alignment: Symmetrical Functional ROM: Pain restricted ROM       Stability: No instability detected Muscle Tone/Strength: Functionally intact. No obvious neuro-muscular anomalies detected. Sensory (Neurological): Dermatomal pain pattern and musculoskeletal  Gait & Posture Assessment  Ambulation: Unassisted Gait: Relatively normal for age and body habitus Posture: WNL   Lower Extremity Exam    Side: Right lower extremity  Side: Left lower extremity  Stability: No instability observed          Stability: No instability observed          Skin & Extremity Inspection: Skin color, temperature, and hair growth are WNL. No peripheral edema or cyanosis. No masses, redness, swelling, asymmetry, or associated skin lesions. No contractures.  Skin & Extremity Inspection: Skin color, temperature, and hair growth are WNL. No peripheral edema or cyanosis. No masses, redness, swelling, asymmetry, or associated skin lesions. No contractures.  Functional ROM: Pain restricted ROM         Limited SLR (straight leg raise)  Functional ROM: Pain restricted ROM for all joints of the lower extremity Limited SLR (straight leg raise)  Muscle Tone/Strength: Functionally intact. No obvious neuro-muscular anomalies detected.  Muscle Tone/Strength: Functionally intact. No obvious neuro-muscular anomalies detected.  Sensory (Neurological): Unimpaired  Sensory (Neurological): Unimpaired        DTR: Patellar: deferred today Achilles: deferred today Plantar: deferred today  DTR: Patellar: deferred today Achilles: deferred today Plantar: deferred today  Palpation: No palpable anomalies  Palpation: No palpable anomalies     Assessment   Status Diagnosis  Recurring Persistent Persistent 1. Chronic bilateral low back  pain with bilateral sciatica   2. Chronic radicular lumbar pain   3. Chronic pain syndrome   4. Other intervertebral disc degeneration, lumbar region       Plan of Care   Ms. KASHENA NOVITSKI has a current medication list which includes the following long-term medication(s): atorvastatin, azelastine, gabapentin, linzess, metformin, and nasal allergy 24 hour.  Orders:  Orders Placed This Encounter  Procedures  . MR LUMBAR SPINE WO CONTRAST    Patient presents with axial pain with possible radicular component.  In addition to any acute findings, please report on:  1. Facet (Zygapophyseal) joint DJD (Hypertrophy, space narrowing, subchondral sclerosis, and/or osteophyte formation) 2. DDD and/or IVDD (Loss of disc height, desiccation or "Black disc disease") 3. Pars defects 4. Spondylolisthesis, spondylosis, and/or spondyloarthropathies (include Degree/Grade of displacement in mm) 5. Vertebral body Fractures, including age (old, new/acute) 37. Modic Type Changes 7. Demineralization 8. Bone pathology 9. Central, Lateral Recess, and/or Foraminal Stenosis (include AP diameter of stenosis in mm) 10. Surgical changes (hardware type, status, and presence of fibrosis)  NOTE: Please specify level(s) and laterality.    Standing Status:   Future    Standing Expiration Date:   07/11/2021    Order Specific Question:   What is the patient's sedation requirement?    Answer:   No Sedation    Order Specific Question:   Does the patient have a pacemaker or implanted devices?    Answer:   No    Order Specific Question:   Preferred imaging location?    Answer:   [facility] (table limit-350lbs)    Order Specific Question:   Call Results- Best Contact Number?    Answer:   [phone] ([facility])    Order Specific Question:   Radiology Contrast Protocol - do NOT remove file path    Answer:   [email or web address]   Follow-up plan:   Return in about 4 weeks  (around 05/08/2021) for After Imaging.   Recent Visits Date Type Provider Dept  03/15/21 Office Visit Gillis Santa, MD [facility]  Showing recent visits within past 90 days and meeting all other requirements Today's Visits Date Type Provider Dept  04/10/21 Office Visit Gillis Santa, MD [facility]  Showing today's visits and meeting all other requirements Future Appointments No visits were found meeting these conditions. Showing future appointments within next 90 days and meeting all other requirements  I discussed the assessment and treatment plan with the patient. The patient was provided an opportunity to ask questions and all were answered. The patient agreed with the plan and demonstrated an understanding of the instructions.  Patient advised to call back or seek an in-person evaluation if the symptoms or condition worsens.  Duration of encounter: 67mnutes.  Note by: BGillis Santa MD Date: 04/10/2021; Time: 3:08 PM

## 2021-04-10 NOTE — Progress Notes (Signed)
Safety precautions to be maintained throughout the outpatient stay will include: orient to surroundings, keep bed in low position, maintain call bell within reach at all times, provide assistance with transfer out of bed and ambulation.  

## 2021-04-12 ENCOUNTER — Ambulatory Visit (INDEPENDENT_AMBULATORY_CARE_PROVIDER_SITE_OTHER): Payer: Medicare Other

## 2021-04-12 DIAGNOSIS — Z0000 Encounter for general adult medical examination without abnormal findings: Secondary | ICD-10-CM

## 2021-04-12 NOTE — Progress Notes (Signed)
Subjective:   Elizabeth [name] is a 55 y.o. female who presents for Medicare Annual (Subsequent) preventive examination.  Virtual Visit via Telephone Note  I connected with  Elizabeth [name] on 04/12/21 at  8:40 AM EDT by telephone and verified that I am speaking with the correct person using two identifiers.  Location: Patient: home Provider: [place] Persons participating in the virtual visit: [place]   I discussed the limitations, risks, security and privacy concerns of performing an evaluation and management service by telephone and the availability of in person appointments. The patient expressed understanding and agreed to proceed.  Interactive audio and video telecommunications were attempted between this nurse and patient, however failed, due to patient having technical difficulties OR patient did not have access to video capability.  We continued and completed visit with audio only.  Some vital signs may be absent or patient reported.   Clemetine Marker, LPN    Review of Systems     Cardiac Risk Factors include: diabetes mellitus;dyslipidemia;obesity (BMI >30kg/m2);hypertension     Objective:    Today's Vitals   04/12/21 0848  PainSc: 4    There is no height or weight on file to calculate BMI.  Advanced Directives 04/12/2021 04/10/2021 03/15/2021 05/16/2020 03/31/2020 04/01/2019 05/07/2018  Does Patient Have a Medical Advance Directive? _0  Yes No  Type of Paramedic of [place];Living will [place];Living will - Living will [place];Living will Living will;Healthcare Power of Attorney -  Does patient want to make changes to medical advance directive? - - No - Patient declined - - - -  Copy of [place] in Chart? No - copy requested - - - No - copy requested No - copy requested -  Would patient like information on creating a medical advance directive? - - - - - - No -  Patient declined    Current Medications (verified) Outpatient Encounter Medications as of 04/12/2021  Medication Sig  . Accu-Chek FastClix Lancets MISC USE TO CHECK BLOOD SUGAR TWICE DAILY  . aspirin 81 MG tablet Take 81 mg by mouth daily.  Marland Kitchen atorvastatin (LIPITOR) 10 MG tablet Take 1 tablet (10 mg total) by mouth at bedtime.  Marland Kitchen azelastine (ASTELIN) 0.1 % nasal spray Place 2 sprays into both nostrils 2 (two) times daily. Use in each nostril as directed  . B-D INS SYR ULTRAFINE 1CC/31G 31G X 5/16" 1 ML MISC   . cholecalciferol (VITAMIN D) 1000 units tablet Take 1,000 Units by mouth daily.  . clobetasol ointment (TEMOVATE) 0.05 % APPLY EXTERNALLY TO THE AFFECTED AREA TWICE DAILY  . empagliflozin (JARDIANCE) 10 MG TABS tablet Take 1 tablet (10 mg total) by mouth daily.  . fluticasone (FLONASE) 50 MCG/ACT nasal spray Place into both nostrils daily.  . folic acid (FOLVITE) 1 MG tablet Take 1 mg by mouth daily.  Marland Kitchen gabapentin (NEURONTIN) 300 MG capsule TAKE 1 CAPSULE BY MOUTH EVERY MORNING AND TAKE 1 AT NOON AND TAKE 2 CAPSULES AT BEDTIME  . glucose blood (ACCU-CHEK GUIDE) test strip AS DIRECTED  . LINZESS 290 MCG CAPS capsule TAKE 1 CAPSULE(290 MCG) BY MOUTH DAILY  . metFORMIN (GLUCOPHAGE-XR) 500 MG 24 hr tablet Take 2 tablets (1,000 mg total) by mouth 2 (two) times daily.  . Methotrexate Sodium (METHOTREXATE, PF,) 50 MG/2ML injection 15 mg once a week.  . Multiple Vitamins-Minerals (MULTIVITAMIN/EXTRA VITAMIN D3 PO) Take by mouth.  . OLANZapine (ZYPREXA) 15 MG  tablet Take 15 mg by mouth at bedtime.  . Omega-3 Fatty Acids (FISH OIL PO) Take by mouth.  Marland Kitchen tiZANidine (ZANAFLEX) 4 MG tablet Take 1 tablet (4 mg total) by mouth at bedtime as needed for muscle spasms.  . traZODone (DESYREL) 100 MG tablet Take 200 mg by mouth at bedtime as needed.  . Vitamin A 2400 MCG (8000 UT) TABS Take by mouth.  . vitamin B-12 (CYANOCOBALAMIN) 1000 MCG tablet Take 1,000 mcg by mouth daily.  Marland Kitchen XIIDRA 5 % SOLN Place 1  drop into both eyes 2 (two) times daily.  . Zinc 22.5 MG TABS Take by mouth.  . [DISCONTINUED] Methotrexate 25 MG/ML SOSY   . OVER THE COUNTER MEDICATION once daily Herbal Name: Goli  . [DISCONTINUED] Accu-Chek Softclix Lancets lancets Use as instructed  . [DISCONTINUED] Blood Glucose Monitoring Suppl (ACCU-CHEK NANO SMARTVIEW) w/Device KIT Use as directed once daily. LON 99, E11.9  . [DISCONTINUED] glucose blood (ACCU-CHEK SMARTVIEW) test strip USE TO TEST ONCE DAILY  . [DISCONTINUED] Multiple Vitamins-Minerals (ZINC PO) Take by mouth.  . [DISCONTINUED] NASAL ALLERGY 24 HOUR 55 MCG/ACT AERO nasal inhaler 2 sprays daily.   No facility-administered encounter medications on file as of 04/12/2021.    Allergies (verified) Ace inhibitors, Enalapril, Penicillins, and Statins   History: Past Medical History:  Diagnosis Date  . Bipolar 1 disorder ([place])   . Depression   . Diabetes mellitus without complication ([place])   . Diverticulosis   . GERD (gastroesophageal reflux disease)   . Heart murmur    followed by PCP  . Hepatitis    B - "not active"  . Hypertension goal BP (blood pressure) < 130/80 01/17/2016  . Insomnia   . Irritable bowel syndrome   . Motion sickness    boats  . Obesity (BMI 30-39.9) 07/03/2015  . RA (rheumatoid arthritis) ([place])   . Sciatica    Past Surgical History:  Procedure Laterality Date  . BOWEL RESECTION    . BREAST BIOPSY Right 12/22/2019   affirm bx x marker, papilloma   . BREAST BIOPSY    . CHOLECYSTECTOMY  1997   [place], [place]  . COLONOSCOPY WITH PROPOFOL N/A 09/07/2015   Procedure: COLONOSCOPY WITH PROPOFOL;  Surgeon: Lucilla Lame, MD;  Location: [place];  Service: Endoscopy;  Laterality: N/A;  . COLONOSCOPY WITH PROPOFOL N/A 05/16/2020   Procedure: COLONOSCOPY WITH PROPOFOL;  Surgeon: Lucilla Lame, MD;  Location[facility] ENDOSCOPY;  Service: Endoscopy;  Laterality: N/A;  . ESOPHAGOGASTRODUODENOSCOPY (EGD) WITH ESOPHAGEAL DILATION    .  ESOPHAGOGASTRODUODENOSCOPY (EGD) WITH PROPOFOL N/A 09/07/2015   Procedure: ESOPHAGOGASTRODUODENOSCOPY (EGD) WITH PROPOFOL;  Surgeon: Lucilla Lame, MD;  Location: [place];  Service: Endoscopy;  Laterality: N/A;  . PARTIAL COLECTOMY N/A 01/24/2016   Procedure: SIGMOID COLECTOMY;  Surgeon: Florene Glen, MD;  Location: [facility];  Service: General;  Laterality: N/A;  . POLYPECTOMY N/A 09/07/2015   Procedure: POLYPECTOMY;  Surgeon: Lucilla Lame, MD;  Location: [place];  Service: Endoscopy;  Laterality: N/A;  . TUBAL LIGATION     Family History  Problem Relation Age of Onset  . Hypertension Mother   . Diabetes Mother   . Arthritis/Rheumatoid Mother   . Hyperlipidemia Mother   . Alcohol abuse Father   . Esophageal varices Father   . Diverticulosis Maternal Aunt   . Diverticulosis Maternal Grandmother   . Diabetes Maternal Grandmother   . Heart disease Maternal Grandmother   . Dementia Maternal Grandmother   . Diabetes Brother   .  Kidney disease Brother   . Aneurysm Maternal Grandfather   . Diabetes Paternal Grandmother   . Healthy Brother   . Depression Daughter        bipolar disorder  . Depression Daughter        bipolar disorder  . Breast cancer Neg Hx    Social History   Socioeconomic History  . Marital status: Married    Spouse name: Mortimer Fries  . Number of children: 3  . Years of education: some college  . Highest education level: 12th grade  Occupational History  . Occupation: Disabled  Tobacco Use  . Smoking status: Former Smoker    Packs/day: 1.00    Years: 1.00    Pack years: 1.00    Types: Cigarettes    Quit date: 05/06/2015    Years since quitting: 5.9  . Smokeless tobacco: Never Used  . Tobacco comment: smoking cessation materials not required  Vaping Use  . Vaping Use: Never used  Substance and Sexual Activity  . Alcohol use: Yes    Alcohol/week: 0.0 standard drinks    Comment: rarely small glass of wine  . Drug use: No  . Sexual  activity: Yes    Birth control/protection: None  Other Topics Concern  . Not on file  Social History Narrative  . Not on file   Social Determinants of Health   Financial Resource Strain: Low Risk   . Difficulty of Paying Living Expenses: Not hard at all  Food Insecurity: No Food Insecurity  . Worried About Charity fundraiser in the Last Year: Never true  . Ran Out of Food in the Last Year: Never true  Transportation Needs: No Transportation Needs  . Lack of Transportation (Medical): No  . Lack of Transportation (Non-Medical): No  Physical Activity: Sufficiently Active  . Days of Exercise per Week: 7 days  . Minutes of Exercise per Session: 30 min  Stress: No Stress Concern Present  . Feeling of Stress : Not at all  Social Connections: Moderately Integrated  . Frequency of Communication with Friends and Family: More than three times a week  . Frequency of Social Gatherings with Friends and Family: Three times a week  . Attends Religious Services: More than 4 times per year  . Active Member of Clubs or Organizations: No  . Attends Archivist Meetings: Never  . Marital Status: Married    Tobacco Counseling Counseling given: Not Answered Comment: smoking cessation materials not required   Clinical Intake:  Pre-visit preparation completed: Yes  Pain : No/denies pain Pain Score: 4  Pain Type: Chronic pain Pain Location: Back Pain Orientation: Lower Pain Descriptors / Indicators: Squeezing,Aching Pain Onset: More than a month ago Pain Frequency: Constant     Nutritional Status: BMI > 30  Obese Nutritional Risks: None Diabetes: Yes CBG done?: No CBG resulted in Enter/ Edit results?: No Did pt. bring in CBG monitor from home?: No  How often do you need to have someone help you when you read instructions, pamphlets, or other written materials from your doctor or pharmacy?: 1 - Never  Nutrition Risk Assessment:  Has the patient had any N/V/D within the  last 2 months?  No  Does the patient have any non-healing wounds?  No  Has the patient had any unintentional weight loss or weight gain?  No   Diabetes:  Is the patient diabetic?  Yes  If diabetic, was a CBG obtained today?  No  Did the patient bring in  their glucometer from home?  No  How often do you monitor your CBG's? daily.   Financial Strains and Diabetes Management:  Are you having any financial strains with the device, your supplies or your medication? No .  Does the patient want to be seen by Chronic Care Management for management of their diabetes?  No  Would the patient like to be referred to a Nutritionist or for Diabetic Management?  No   Diabetic Exams:  Diabetic Eye Exam: Completed 03/05/21 negative retinopathy.   Diabetic Foot Exam: Completed 01/31/21.    Interpreter Needed?: No  Information entered by :: Clemetine Marker LPN   Activities of Daily Living In your present state of health, do you have any difficulty performing the following activities: 04/12/2021 02/19/2021  Hearing? N N  Comment declines hearing aids -  Vision? N N  Difficulty concentrating or making decisions? N N  Walking or climbing stairs? N N  Dressing or bathing? N N  Doing errands, shopping? N N  Preparing Food and eating ? N -  Using the Toilet? N -  In the past six months, have you accidently leaked urine? N -  Do you have problems with loss of bowel control? N -  Managing your Medications? N -  Managing your Finances? N -  Housekeeping or managing your Housekeeping? N -  Some recent data might be hidden    Patient Care Team: Myles Gip, DO as PCP - General (Family Medicine) Gillis Santa, MD as Consulting Physician (Pain Medicine) Quintin Alto, MD as Consulting Physician (Rheumatology) Clyde Canterbury, MD as Referring Physician (Otolaryngology) Lucilla Lame, MD as Consulting Physician (Gastroenterology)  Indicate any recent Medical Services you may have received from other  than [facility] providers in the past year (date may be approximate).     Assessment:   This is a routine wellness examination for Elizabeth [name].  Hearing/Vision screen  Hearing Screening   _0  _1  _2  _3  _4  _5  _6  _7  _8   Right ear:           Left ear:           Comments: Pt denies hearing difficulty  Vision Screening Comments: Annual vision screenings done at [place] issues and exercise activities discussed: Current Exercise Habits: Home exercise routine, Type of exercise: walking, Time (Minutes): 30, Frequency (Times/Week): 7, Weekly Exercise (Minutes/Week): 210, Intensity: Moderate, Exercise limited by: orthopedic condition(s)  Goals Addressed            This Visit's Progress   . Weight (lb) < 200 lb (90.7 kg)       Pt states she would like to lose weight with physical activity and health eating.      Depression Screen PHQ 2/9 Scores 04/12/2021 04/10/2021 02/19/2021 02/19/2021 01/31/2021 12/05/2020 10/19/2020  PHQ - 2 Score 0 0 0 3 0 0 0  PHQ- 9 Score - - - - - - -    Fall Risk Fall Risk  04/12/2021 04/10/2021 03/15/2021 02/19/2021 01/31/2021  Falls in the past year? 0 0 0 0 0  Comment - - - - -  Number falls in past yr: 0 - - 0 0  Injury with Fall? 0 - - 0 0  Comment - - - - -  Risk for fall due to : Orthopedic patient - - - -  Risk for fall due to: Comment - - - - -  Follow up Falls prevention discussed - - - -    FALL  RISK PREVENTION PERTAINING TO THE HOME:  Any stairs in or around the home? Yes  If so, are there any without handrails? Yes  - 1 step Home free of loose throw rugs in walkways, pet beds, electrical cords, etc? Yes  Adequate lighting in your home to reduce risk of falls? Yes   ASSISTIVE DEVICES UTILIZED TO PREVENT FALLS:  Life alert? No  Use of a cane, walker or w/c? No  Grab bars in the bathroom? Yes  Shower chair or bench in shower? No  Elevated toilet seat or a handicapped toilet? No   TIMED UP AND GO:  Was the  test performed? No . Telephonic visit.   Cognitive Function: Normal cognitive status assessed by direct observation by this Nurse Health Advisor. No abnormalities found.       6CIT Screen 04/01/2019 03/27/2018  What Year? 0 points 0 points  What month? 0 points 0 points  What time? 0 points 0 points  Count back from 20 0 points 0 points  Months in reverse 0 points 0 points  Repeat phrase 0 points 0 points  Total Score 0 0    Immunizations Immunization History  Administered Date(s) Administered  . Hepatitis A, Adult 11/12/2017, 05/12/2018  . Influenza,inj,Quad PF,6+ Mos 08/06/2017, 07/14/2018, 08/10/2019, 07/25/2020  . Influenza-Unspecified 08/06/2017  . Moderna Sars-Covid-2 Vaccination 02/10/2020, 03/04/2020, 07/25/2020, 03/07/2021  . Pneumococcal Polysaccharide-23 11/04/2017  . Td 11/04/2017  . Tdap 11/29/2015, 11/04/2017  . Zoster Recombinat (Shingrix) 01/08/2018, 04/01/2018    TDAP status: Up to date  Flu Vaccine status: Up to date  Pneumococcal vaccine status: Up to date  Covid-19 vaccine status: Completed vaccines  Qualifies for Shingles Vaccine? Yes   Zostavax completed Yes   Shingrix Completed?: Yes  Screening Tests Health Maintenance  Topic Date Due  . PAP SMEAR-Modifier  05/26/2021  . INFLUENZA VACCINE  07/09/2021  . URINE MICROALBUMIN  07/19/2021  . HEMOGLOBIN A1C  07/31/2021  . FOOT EXAM  01/31/2022  . MAMMOGRAM  02/15/2022  . OPHTHALMOLOGY EXAM  03/05/2022  . COLONOSCOPY (Pts 45-49yr Insurance coverage will need to be confirmed)  05/17/2027  . TETANUS/TDAP  11/05/2027  . PNEUMOCOCCAL POLYSACCHARIDE VACCINE AGE 28-64 HIGH RISK  Completed  . COVID-19 Vaccine  Completed  . Hepatitis C Screening  Completed  . HIV Screening  Completed  . HPV VACCINES  Aged Out    Health Maintenance  Health Maintenance Due  Topic Date Due  . PAP SMEAR-Modifier  05/26/2021    Colorectal cancer screening: Type of screening: Colonoscopy. Completed 05/16/20. Repeat  every 7 years  Mammogram status: completed 02/15/21. Repeat every year.   Bone density screening: due age 55 Lung Cancer Screening: (Low Dose CT Chest recommended if Age 54-80years, 30 pack-year currently smoking OR have quit w/in 15years.) does not qualify.   Additional Screening:  Hepatitis C Screening: does qualify; Completed 11/08/15  Vision Screening: Recommended annual ophthalmology exams for early detection of glaucoma and other disorders of the eye. Is the patient up to date with their annual eye exam?  Yes  Who is the provider or what is the name of the office in which the patient [facility]ends annual [facility] exams[facility]   Dental Screening: Recommended annual dental exams for proper oral hygiene  Community Resource Referral / Chronic Care Management: CRR required this visit?  No   CCM required this visit?  No      Plan:     I have personally reviewed and noted the following  in the patient's chart:   . Medical and social history . Use of alcohol, tobacco or illicit drugs  . Current medications and supplements including opioid prescriptions.  . Functional ability and status . Nutritional status . Physical activity . Advanced directives . List of other physicians . Hospitalizations, surgeries, and ER visits in previous 12 months . Vitals . Screenings to include cognitive, depression, and falls . Referrals and appointments  In addition, I have reviewed and discussed with patient certain preventive protocols, quality metrics, and best practice recommendations. A written personalized care plan for preventive services as well as general preventive health recommendations were provided to patient.     Clemetine Marker, LPN   07/11/734   Nurse Notes: none

## 2021-04-12 NOTE — Patient Instructions (Addendum)
Elizabeth [name] , Thank you for taking time to come for your Medicare Wellness Visit. I appreciate your ongoing commitment to your health goals. Please review the following plan we discussed and let me know if I can assist you in the future.   Screening recommendations/referrals: Colonoscopy: done 05/16/20. Repeat in 2028.  Mammogram: done 02/15/21 Bone Density: due age 55 Recommended yearly ophthalmology/optometry visit for glaucoma screening and checkup Recommended yearly dental visit for hygiene and checkup  Vaccinations: Influenza vaccine: done 07/25/20 Pneumococcal vaccine: done 11/04/17 Tdap vaccine: done 11/04/17 Shingles vaccine: done 01/08/18 & 04/01/18  Covid-19: done 02/10/20, 03/04/20, 07/25/20 & 03/07/21  Advanced directives: Please bring a copy of your health care power of attorney and living will to the office at your convenience.  Conditions/risks identified: Recommend healthy eating and physical activity for desired weight loss.   Next appointment: Follow up in one year for your annual wellness visit.   Preventive Care 40-64 Years, Female Preventive care refers to lifestyle choices and visits with your health care provider that can promote health and wellness. What does preventive care include?  A yearly physical exam. This is also called an annual well check.  Dental exams once or twice a year.  Routine eye exams. Ask your health care provider how often you should have your eyes checked.  Personal lifestyle choices, including:  Daily care of your teeth and gums.  Regular physical activity.  Eating a healthy diet.  Avoiding tobacco and drug use.  Limiting alcohol use.  Practicing safe sex.  Taking low-dose aspirin daily starting at age 66.  Taking vitamin and mineral supplements as recommended by your health care provider. What happens during an annual well check? The services and screenings done by your health care provider during your annual well check will  depend on your age, overall health, lifestyle risk factors, and family history of disease. Counseling  Your health care provider may ask you questions about your:  Alcohol use.  Tobacco use.  Drug use.  Emotional well-being.  Home and relationship well-being.  Sexual activity.  Eating habits.  Work and work Statistician.  Method of birth control.  Menstrual cycle.  Pregnancy history. Screening  You may have the following tests or measurements:  Height, weight, and BMI.  Blood pressure.  Lipid and cholesterol levels. These may be checked every 5 years, or more frequently if you are over 95 years old.  Skin check.  Lung cancer screening. You may have this screening every year starting at age 33 if you have a 30-pack-year history of smoking and currently smoke or have quit within the past 15 years.  Fecal occult blood test (FOBT) of the stool. You may have this test every year starting at age 30.  Flexible sigmoidoscopy or colonoscopy. You may have a sigmoidoscopy every 5 years or a colonoscopy every 10 years starting at age 50.  Hepatitis C blood test.  Hepatitis B blood test.  Sexually transmitted disease (STD) testing.  Diabetes screening. This is done by checking your blood sugar (glucose) after you have not eaten for a while (fasting). You may have this done every 1-3 years.  Mammogram. This may be done every 1-2 years. Talk to your health care provider about when you should start having regular mammograms. This may depend on whether you have a family history of breast cancer.  BRCA-related cancer screening. This may be done if you have a family history of breast, ovarian, tubal, or peritoneal cancers.  Pelvic exam and Pap  test. This may be done every 3 years starting at age 78. Starting at age 56, this may be done every 5 years if you have a Pap test in combination with an HPV test.  Bone density scan. This is done to screen for osteoporosis. You may have  this scan if you are at high risk for osteoporosis. Discuss your test results, treatment options, and if necessary, the need for more tests with your health care provider. Vaccines  Your health care provider may recommend certain vaccines, such as:  Influenza vaccine. This is recommended every year.  Tetanus, diphtheria, and acellular pertussis (Tdap, Td) vaccine. You may need a Td booster every 10 years.  Zoster vaccine. You may need this after age 86.  Pneumococcal 13-valent conjugate (PCV13) vaccine. You may need this if you have certain conditions and were not previously vaccinated.  Pneumococcal polysaccharide (PPSV23) vaccine. You may need one or two doses if you smoke cigarettes or if you have certain conditions. Talk to your health care provider about which screenings and vaccines you need and how often you need them. This information is not intended to replace advice given to you by your health care provider. Make sure you discuss any questions you have with your health care provider. Document Released: 12/22/2015 Document Revised: 08/14/2016 Document Reviewed: 09/26/2015 Elsevier Interactive Patient Education  2017 [place] Prevention in the Home Falls can cause injuries. They can happen to people of all ages. There are many things you can do to make your home safe and to help prevent falls. What can I do on the outside of my home?  Regularly fix the edges of walkways and driveways and fix any cracks.  Remove anything that might make you trip as you walk through a door, such as a raised step or threshold.  Trim any bushes or trees on the path to your home.  Use bright outdoor lighting.  Clear any walking paths of anything that might make someone trip, such as rocks or tools.  Regularly check to see if handrails are loose or broken. Make sure that both sides of any steps have handrails.  Any raised decks and porches should have guardrails on the  edges.  Have any leaves, snow, or ice cleared regularly.  Use sand or salt on walking paths during winter.  Clean up any spills in your garage right away. This includes oil or grease spills. What can I do in the bathroom?  Use night lights.  Install grab bars by the toilet and in the tub and shower. Do not use towel bars as grab bars.  Use non-skid mats or decals in the tub or shower.  If you need to sit down in the shower, use a plastic, non-slip stool.  Keep the floor dry. Clean up any water that spills on the floor as soon as it happens.  Remove soap buildup in the tub or shower regularly.  Attach bath mats securely with double-sided non-slip rug tape.  Do not have throw rugs and other things on the floor that can make you trip. What can I do in the bedroom?  Use night lights.  Make sure that you have a light by your bed that is easy to reach.  Do not use any sheets or blankets that are too big for your bed. They should not hang down onto the floor.  Have a firm chair that has side arms. You can use this for support while you get  dressed.  Do not have throw rugs and other things on the floor that can make you trip. What can I do in the kitchen?  Clean up any spills right away.  Avoid walking on wet floors.  Keep items that you use a lot in easy-to-reach places.  If you need to reach something above you, use a strong step stool that has a grab bar.  Keep electrical cords out of the way.  Do not use floor polish or wax that makes floors slippery. If you must use wax, use non-skid floor wax.  Do not have throw rugs and other things on the floor that can make you trip. What can I do with my stairs?  Do not leave any items on the stairs.  Make sure that there are handrails on both sides of the stairs and use them. Fix handrails that are broken or loose. Make sure that handrails are as long as the stairways.  Check any carpeting to make sure that it is firmly  attached to the stairs. Fix any carpet that is loose or worn.  Avoid having throw rugs at the top or bottom of the stairs. If you do have throw rugs, attach them to the floor with carpet tape.  Make sure that you have a light switch at the top of the stairs and the bottom of the stairs. If you do not have them, ask someone to add them for you. What else can I do to help prevent falls?  Wear shoes that:  Do not have high heels.  Have rubber bottoms.  Are comfortable and fit you well.  Are closed at the toe. Do not wear sandals.  If you use a stepladder:  Make sure that it is fully opened. Do not climb a closed stepladder.  Make sure that both sides of the stepladder are locked into place.  Ask someone to hold it for you, if possible.  Clearly mark and make sure that you can see:  Any grab bars or handrails.  First and last steps.  Where the edge of each step is.  Use tools that help you move around (mobility aids) if they are needed. These include:  Canes.  Walkers.  Scooters.  Crutches.  Turn on the lights when you go into a dark area. Replace any light bulbs as soon as they burn out.  Set up your furniture so you have a clear path. Avoid moving your furniture around.  If any of your floors are uneven, fix them.  If there are any pets around you, be aware of where they are.  Review your medicines with your doctor. Some medicines can make you feel dizzy. This can increase your chance of falling. Ask your doctor what other things that you can do to help prevent falls. This information is not intended to replace advice given to you by your health care provider. Make sure you discuss any questions you have with your health care provider. Document Released: 09/21/2009 Document Revised: 05/02/2016 Document Reviewed: 12/30/2014 Elsevier Interactive Patient Education  2017 [employer].

## 2021-04-20 ENCOUNTER — Other Ambulatory Visit: Payer: Self-pay | Admitting: Family Medicine

## 2021-04-23 ENCOUNTER — Encounter: Payer: Self-pay | Admitting: Family Medicine

## 2021-04-24 ENCOUNTER — Telehealth: Payer: Self-pay

## 2021-04-24 NOTE — Telephone Encounter (Signed)
Nothing is sent? Blank note?

## 2021-04-24 NOTE — Telephone Encounter (Signed)
Pt will need appt ???

## 2021-04-24 NOTE — Telephone Encounter (Signed)
FYI pt called to schedule an appt. Was advised by Cassandra in the office to send to [facility] due to no availability. Pt advised and stated understanding. Please advise.

## 2021-04-24 NOTE — Telephone Encounter (Signed)
Pt calling in again regarding a yeast infection. She states that she is needing an antibiotic to help. Attempted to set pt up with an appt and pt declined. Please advise.

## 2021-04-25 ENCOUNTER — Other Ambulatory Visit: Payer: Self-pay | Admitting: Family Medicine

## 2021-04-25 ENCOUNTER — Ambulatory Visit
Admission: RE | Admit: 2021-04-25 | Discharge: 2021-04-25 | Disposition: A | Discharge status: Home / Self Care | Payer: Medicare Other | Source: Ambulatory Visit | Attending: Student in an Organized Health Care Education/Training Program | Admitting: Student in an Organized Health Care Education/Training Program

## 2021-04-25 ENCOUNTER — Other Ambulatory Visit: Payer: Self-pay

## 2021-04-25 DIAGNOSIS — G8929 Other chronic pain: Secondary | ICD-10-CM | POA: Insufficient documentation

## 2021-04-25 DIAGNOSIS — G894 Chronic pain syndrome: Secondary | ICD-10-CM | POA: Diagnosis present

## 2021-04-25 DIAGNOSIS — M5441 Lumbago with sciatica, right side: Secondary | ICD-10-CM | POA: Diagnosis present

## 2021-04-25 DIAGNOSIS — M5136 Other intervertebral disc degeneration, lumbar region: Secondary | ICD-10-CM | POA: Insufficient documentation

## 2021-04-25 DIAGNOSIS — M5442 Lumbago with sciatica, left side: Secondary | ICD-10-CM | POA: Diagnosis not present

## 2021-04-25 DIAGNOSIS — M5416 Radiculopathy, lumbar region: Secondary | ICD-10-CM | POA: Insufficient documentation

## 2021-05-06 ENCOUNTER — Other Ambulatory Visit: Payer: Self-pay | Admitting: Student in an Organized Health Care Education/Training Program

## 2021-05-06 DIAGNOSIS — G894 Chronic pain syndrome: Secondary | ICD-10-CM

## 2021-05-21 ENCOUNTER — Encounter: Payer: Self-pay | Admitting: Student in an Organized Health Care Education/Training Program

## 2021-05-21 ENCOUNTER — Ambulatory Visit
Payer: Medicare Other | Attending: Student in an Organized Health Care Education/Training Program | Admitting: Student in an Organized Health Care Education/Training Program

## 2021-05-21 ENCOUNTER — Other Ambulatory Visit: Payer: Self-pay

## 2021-05-21 VITALS — BP 121/84 | HR 79 | Temp 98.3°F | Resp 96 | Ht 67.0 in | Wt 239.0 lb

## 2021-05-21 DIAGNOSIS — G894 Chronic pain syndrome: Secondary | ICD-10-CM | POA: Insufficient documentation

## 2021-05-21 DIAGNOSIS — M48062 Spinal stenosis, lumbar region with neurogenic claudication: Secondary | ICD-10-CM | POA: Diagnosis present

## 2021-05-21 DIAGNOSIS — G8929 Other chronic pain: Secondary | ICD-10-CM | POA: Diagnosis present

## 2021-05-21 DIAGNOSIS — M5441 Lumbago with sciatica, right side: Secondary | ICD-10-CM | POA: Diagnosis present

## 2021-05-21 DIAGNOSIS — M5416 Radiculopathy, lumbar region: Secondary | ICD-10-CM | POA: Insufficient documentation

## 2021-05-21 DIAGNOSIS — M5442 Lumbago with sciatica, left side: Secondary | ICD-10-CM | POA: Diagnosis not present

## 2021-05-21 MED ORDER — GABAPENTIN 600 MG PO TABS: 600.0000 mg | ORAL_TABLET | Freq: Three times a day (TID) | ORAL | 2 refills | Status: DC

## 2021-05-21 NOTE — Progress Notes (Signed)
PROVIDER NOTE: Information contained herein reflects review and annotations entered in association with encounter. Interpretation of such information and data should be left to medically-trained personnel. Information provided to patient can be located elsewhere in the medical record under "Patient Instructions". Document created using STT-dictation technology, any transcriptional errors that may result from process are unintentional.    Patient: Elizabeth [name]  Service Category: E/M  Provider: Gillis Santa, MD  DOB: 08-25-1966  DOS: 05/21/2021  Specialty: Interventional Pain Management  MRN: [id number]  Setting: Ambulatory outpatient  PCP: Myles Gip, DO  Type: Established Patient    Referring Provider: Myles Gip, DO  Location: Office  Delivery: Face-to-face     HPI  Elizabeth [name], a 55 y.o. year old female, is here today because of her Chronic radicular lumbar pain [M54.16, G89.29]. Elizabeth [name] primary complain today is Back Pain (Lumbar bilateral ) Last encounter: My last encounter with her was on 05/06/2021. Pertinent problems: Elizabeth [name] does not have any pertinent problems on file. Pain Assessment: Severity of Chronic pain is reported as a 4 /10. Location: Back Lower, Left, Right/into hips, butt cheeks, tailbone and down the legs. Onset: More than a month ago. Quality: Discomfort, Constant, Pressure. Timing: Constant. Modifying factor(s): rest. Vitals:  height is '5\' 7"'  (1.702 m) and weight is 239 lb (108.4 kg). Her temporal temperature is 98.3 F (36.8 C). Her blood pressure is 121/84 and her pulse is 79. Her respiration is 96 (abnormal).   Reason for encounter: follow-up evaluation    Berry presents today for follow-up evaluation to discuss her lumbar MRI and to discuss treatment plan.  No significant changes in her medical history since her last clinic visit.  She is complaining of pain in her lower back that radiates into bilateral hips, buttocks as well as down  bilateral legs premature equal on both sides.  She continues gabapentin 600 mg in the morning, 300 mg in the afternoon, 600 mg nightly.  She has done physical therapy in the past with limited response.  She has never had a lumbar epidural steroid injection.  ROS  Constitutional: Denies any fever or chills Gastrointestinal: No reported hemesis, hematochezia, vomiting, or acute GI distress Musculoskeletal:  Low back, bilateral hip, buttock pain with occasional radiation down posterior lateral leg past the Neurological: No reported episodes of acute onset apraxia, aphasia, dysarthria, agnosia, amnesia, paralysis, loss of coordination, or loss of consciousness  Medication Review  Accu-Chek FastClix Lancets, Insulin Syringe-Needle U-100, Lifitegrast, Multiple Vitamins-Minerals, OLANZapine, OVER THE COUNTER MEDICATION, Omega-3 Fatty Acids, Vitamin A, Zinc, aspirin, atorvastatin, azelastine, cholecalciferol, clobetasol ointment, empagliflozin, fluticasone, folic acid, gabapentin, glucose blood, linaclotide, metFORMIN, methotrexate (PF), tiZANidine, traZODone, and vitamin B-12  History Review  Allergy: Elizabeth [name] is allergic to ace inhibitors, enalapril, penicillins, and statins. Drug: Elizabeth [name]  reports no history of drug use. Alcohol:  reports current alcohol use. Tobacco:  reports that she quit smoking about 6 years ago. Her smoking use included cigarettes. She has a 1.00 pack-year smoking history. She has never used smokeless tobacco. Social: Elizabeth [name]  reports that she quit smoking about 6 years ago. Her smoking use included cigarettes. She has a 1.00 pack-year smoking history. She has never used smokeless tobacco. She reports current alcohol use. She reports that she does not use drugs. Medical:  has a past medical history of Bipolar 1 disorder ([place]), Depression, Diabetes mellitus without complication ([place]), Diverticulosis, GERD (gastroesophageal reflux disease), Heart murmur, Hepatitis,  Hypertension goal BP (blood pressure) <  130/80 (01/17/2016), Insomnia, Irritable bowel syndrome, Motion sickness, Obesity (BMI 30-39.9) (07/03/2015), RA (rheumatoid arthritis) ([place]), and Sciatica. Surgical: Elizabeth [name]  has a past surgical history that includes Tubal ligation; Esophagogastroduodenoscopy (egd) with esophageal dilation; Cholecystectomy (1997); Colonoscopy with propofol (N/A, 09/07/2015); Esophagogastroduodenoscopy (egd) with propofol (N/A, 09/07/2015); polypectomy (N/A, 09/07/2015); Partial colectomy (N/A, 01/24/2016); Bowel resection; Colonoscopy with propofol (N/A, 05/16/2020); Breast biopsy (Right, 12/22/2019); and Breast biopsy. Family: family history includes Alcohol abuse in her father; Aneurysm in her maternal grandfather; Arthritis/Rheumatoid in her mother; Dementia in her maternal grandmother; Depression in her daughter and daughter; Diabetes in her brother, maternal grandmother, mother, and paternal grandmother; Diverticulosis in her maternal aunt and maternal grandmother; Esophageal varices in her father; Healthy in her brother; Heart disease in her maternal grandmother; Hyperlipidemia in her mother; Hypertension in her mother; Kidney disease in her brother.  Laboratory Chemistry Profile   Renal Lab Results  Component Value Date   BUN 11 01/31/2021   CREATININE 0.80 01/31/2021   LABCREA 162 60/45/4098   BCR NOT APPLICABLE 11/91/4782   GFRAA 96 09/07/2020   GFRNONAA 83 09/07/2020     Hepatic Lab Results  Component Value Date   AST 22 09/07/2020   ALT 40 (H) 09/07/2020   ALBUMIN 4.1 04/01/2017   ALKPHOS 102 04/01/2017   LIPASE 13 09/07/2020     Electrolytes Lab Results  Component Value Date   NA 137 01/31/2021   K 4.4 01/31/2021   CL 104 01/31/2021   CALCIUM 9.5 01/31/2021     Bone Lab Results  Component Value Date   VD25OH 34 07/02/2017     Inflammation (CRP: Acute Phase) (ESR: Chronic Phase) No results found for: CRP, ESRSEDRATE, LATICACIDVEN     Note:  Above Lab results reviewed.  Recent Imaging Review  MR LUMBAR SPINE WO CONTRAST CLINICAL DATA:  Osteoarthritis, lumbosacral. Low back pain. Lumbar radiculopathy.  EXAM: MRI LUMBAR SPINE WITHOUT CONTRAST  TECHNIQUE: Multiplanar, multisequence MR imaging of the lumbar spine was performed. No intravenous contrast was administered.  COMPARISON:  Radiographs March 15, 2021  FINDINGS: Segmentation:  Standard.  Alignment:  Physiologic.  Vertebrae:  No fracture, evidence of discitis, or bone lesion.  Conus medullaris and cauda equina: Conus extends to the L1 level. Conus and cauda equina appear normal. Incidental note made of fibrofatty filum.  Paraspinal and other soft tissues: Negative.  Disc levels:  T12-L1: No spinal canal or neural foraminal stenosis.  L1-2: No spinal canal or neural foraminal stenosis.  L2-3: Shallow disc bulge, mild facet degenerative changes and ligamentum flavum redundancy without significant spinal canal stenosis. Mild bilateral neural foraminal narrowing.  L3-4: Disc bulge, moderate hypertrophic facet degenerative changes and ligamentum flavum redundancy resulting in mild spinal canal stenosis and mild bilateral neural foraminal narrowing.  L4-5: Disc bulge, moderate hypertrophic facet degenerative change ligamentum flavum redundancy resulting in mild spinal canal stenosis mild bilateral neural foraminal narrowing.  L5-S1: Mild facet degenerative changes. No spinal canal or neural foraminal stenosis.  IMPRESSION: 1. Mild degenerative changes of the lumbar spine, mostly at the level of the facet joints at L3-4 and L4-5 with mild spinal canal stenosis at this levels. 2. No high-grade spinal canal or neural foraminal stenosis at any level.  Electronically Signed   By: Pedro Earls M.D.   On: 04/26/2021 10:58 Note: Reviewed        Physical Exam  General appearance: Well nourished, well developed, and well hydrated. In no  apparent acute distress Mental status: Alert, oriented x 3 (  person, place, & time)       Respiratory: No evidence of acute respiratory distress Eyes: PERLA Vitals: BP 121/84 (BP Location: Left Arm, Patient Position: Sitting, Cuff Size: Large)   Pulse 79   Temp 98.3 F (36.8 C) (Temporal)   Resp (!) 96   Ht '5\' 7"'  (1.702 m)   Wt 239 lb (108.4 kg)   LMP 07/09/2014   HC 16" (40.6 cm)   BMI 37.43 kg/m  BMI: Estimated body mass index is 37.43 kg/m as calculated from the following:   Height as of this encounter: '5\' 7"'  (1.702 m).   Weight as of this encounter: 239 lb (108.4 kg). Ideal: Ideal body weight: 61.6 kg (135 lb 12.9 oz) Adjusted ideal body weight: 80.3 kg (177 lb 1.3 oz)    Lumbar Exam  Skin & Axial Inspection: No masses, redness, or swelling Alignment: Symmetrical Functional ROM: Pain restricted ROM       Stability: No instability detected Muscle Tone/Strength: Functionally intact. No obvious neuro-muscular anomalies detected. Sensory (Neurological): Dermatomal pain pattern and musculoskeletal Positive straight leg raise test bilaterally Gait & Posture Assessment  Ambulation: Unassisted Gait: Relatively normal for age and body habitus Posture: WNL    Lower Extremity Exam      Side: Right lower extremity   Side: Left lower extremity  Stability: No instability observed           Stability: No instability observed          Skin & Extremity Inspection: Skin color, temperature, and hair growth are WNL. No peripheral edema or cyanosis. No masses, redness, swelling, asymmetry, or associated skin lesions. No contractures.   Skin & Extremity Inspection: Skin color, temperature, and hair growth are WNL. No peripheral edema or cyanosis. No masses, redness, swelling, asymmetry, or associated skin lesions. No contractures.  Functional ROM: Pain restricted ROM         Limited SLR (straight leg raise)   Functional ROM: Pain restricted ROM for all joints of the lower extremity Limited SLR  (straight leg raise)  Muscle Tone/Strength: Functionally intact. No obvious neuro-muscular anomalies detected.   Muscle Tone/Strength: Functionally intact. No obvious neuro-muscular anomalies detected.  Sensory (Neurological): Unimpaired         Sensory (Neurological): Unimpaired        DTR: Patellar: deferred today Achilles: deferred today Plantar: deferred today   DTR: Patellar: deferred today Achilles: deferred today Plantar: deferred today  Palpation: No palpable anomalies   Palpation: No palpable anomalies        Assessment   Status Diagnosis  Persistent Persistent Persistent 1. Chronic radicular lumbar pain   2. Spinal stenosis, lumbar region, with neurogenic claudication   3. Chronic bilateral low back pain with bilateral sciatica   4. Chronic pain syndrome      Updated Problems: Problem  Spinal Stenosis, Lumbar Region, With Neurogenic Claudication  Chronic Pain Syndrome    Plan of Care   I spent a great deal of time reviewing the patient's lumbar MRI with her explaining to her on a spine model what her pathology is.  Patient has lumbar spinal stenosis with associated hypertrophic facet degenerative changes.  These are most pronounced at L3-L4 and L4-L5 with associated mild spinal canal stenosis.  She does not have any neuroforaminal stenosis.  At this point recommend that she increase her gabapentin to 600 mg 3 times a day.  Instructed her to continue with home stretching exercises that she has learned in the past with physical therapy.  Also recommended lumbar epidural steroid injection for lumbar radicular pain flare associated with spinal canal stenosis.  Risks and benefits of lumbar MRI were discussed and patient would like to proceed.  Future considerations could include lumbar facet medial branch nerve block.   Elizabeth [name] has a current medication list which includes the following long-term medication(s): atorvastatin, azelastine, gabapentin, linzess, and  metformin.  Pharmacotherapy (Medications Ordered): Meds ordered this encounter  Medications   gabapentin (NEURONTIN) 600 MG tablet    Sig: Take 1 tablet (600 mg total) by mouth 3 (three) times daily.    Dispense:  90 tablet    Refill:  2   Orders:  Orders Placed This Encounter  Procedures   Lumbar Epidural Injection    Standing Status:   Future    Standing Expiration Date:   06/20/2021    Scheduling Instructions:     Procedure: Interlaminar Lumbar Epidural Steroid injection (LESI)            Laterality: Midline     Sedation: po valium     Timeframe: ASAA    Order Specific Question:   Where will this procedure be performed?    Answer:   [facility]   Follow-up plan:   Return in about 2 weeks (around 06/04/2021) for L4/5 ESI w PO Valium.    Recent Visits Date Type Provider Dept  04/10/21 Office Visit Gillis Santa, MD [facility]  03/15/21 Office Visit Gillis Santa, MD [facility]  Showing recent visits within past 90 days and meeting all other requirements Today's Visits Date Type Provider Dept  05/21/21 Office Visit Gillis Santa, MD [facility]  Showing today's visits and meeting all other requirements Future Appointments Date Type Provider Dept  06/04/21 Appointment Gillis Santa, MD [facility]  Showing future appointments within next 90 days and meeting all other requirements I discussed the assessment and treatment plan with the patient. The patient was provided an opportunity to ask questions and all were answered. The patient agreed with the plan and demonstrated an understanding of the instructions.  Patient advised to call back or seek an in-person evaluation if the symptoms or condition worsens.  Duration of encounter: 79mnutes.  Note by: BGillis Santa MD Date: 05/21/2021; Time: 1:33 PM

## 2021-05-21 NOTE — Progress Notes (Signed)
Safety precautions to be maintained throughout the outpatient stay will include: orient to surroundings, keep bed in low position, maintain call bell within reach at all times, provide assistance with transfer out of bed and ambulation.  

## 2021-05-21 NOTE — Patient Instructions (Signed)
____________________________________________________________________________________________  General Risks and Possible Complications  Patient Responsibilities: It is important that you read this as it is part of your informed consent. It is our duty to inform you of the risks and possible complications associated with treatments offered to you. It is your responsibility as a patient to read this and to ask questions about anything that is not clear or that you believe was not covered in this document.  Patient's Rights: You have the right to refuse treatment. You also have the right to change your mind, even after initially having agreed to have the treatment done. However, under this last option, if you wait until the last second to change your mind, you may be charged for the materials used up to that point.  Introduction: Medicine is not an exact science. Everything in Medicine, including the lack of treatment(s), carries the potential for danger, harm, or loss (which is by definition: Risk). In Medicine, a complication is a secondary problem, condition, or disease that can aggravate an already existing one. All treatments carry the risk of possible complications. The fact that a side effects or complications occurs, does not imply that the treatment was conducted incorrectly. It must be clearly understood that these can happen even when everything is done following the highest safety standards.  No treatment: You can choose not to proceed with the proposed treatment alternative. The "PRO(s)" would include: avoiding the risk of complications associated with the therapy. The "CON(s)" would include: not getting any of the treatment benefits. These benefits fall under one of three categories: diagnostic; therapeutic; and/or palliative. Diagnostic benefits include: getting information which can ultimately lead to improvement of the disease or symptom(s). Therapeutic benefits are those associated with the  successful treatment of the disease. Finally, palliative benefits are those related to the decrease of the primary symptoms, without necessarily curing the condition (example: decreasing the pain from a flare-up of a chronic condition, such as incurable terminal cancer).  General Risks and Complications: These are associated to most interventional treatments. They can occur alone, or in combination. They fall under one of the following six (6) categories: no benefit or worsening of symptoms; bleeding; infection; nerve damage; allergic reactions; and/or death. No benefits or worsening of symptoms: In Medicine there are no guarantees, only probabilities. No healthcare provider can ever guarantee that a medical treatment will work, they can only state the probability that it may. Furthermore, there is always the possibility that the condition may worsen, either directly, or indirectly, as a consequence of the treatment. Bleeding: This is more common if the patient is taking a blood thinner, either prescription or over the counter (example: Goody Powders, Fish oil, Aspirin, Garlic, etc.), or if suffering a condition associated with impaired coagulation (example: Hemophilia, cirrhosis of the liver, low platelet counts, etc.). However, even if you do not have one on these, it can still happen. If you have any of these conditions, or take one of these drugs, make sure to notify your treating physician. Infection: This is more common in patients with a compromised immune system, either due to disease (example: diabetes, cancer, human immunodeficiency virus [HIV], etc.), or due to medications or treatments (example: therapies used to treat cancer and rheumatological diseases). However, even if you do not have one on these, it can still happen. If you have any of these conditions, or take one of these drugs, make sure to notify your treating physician. Nerve Damage: This is more common when the treatment is an invasive    one, but it can also happen with the use of medications, such as those used in the treatment of cancer. The damage can occur to small secondary nerves, or to large primary ones, such as those in the spinal cord and brain. This damage may be temporary or permanent and it may lead to impairments that can range from temporary numbness to permanent paralysis and/or brain death. Allergic Reactions: Any time a substance or material comes in contact with our body, there is the possibility of an allergic reaction. These can range from a mild skin rash (contact dermatitis) to a severe systemic reaction (anaphylactic reaction), which can result in death. Death: In general, any medical intervention can result in death, most of the time due to an unforeseen complication. ____________________________________________________________________________________________ ____________________________________________________________________________________________  General Risks and Possible Complications  Patient Responsibilities: It is important that you read this as it is part of your informed consent. It is our duty to inform you of the risks and possible complications associated with treatments offered to you. It is your responsibility as a patient to read this and to ask questions about anything that is not clear or that you believe was not covered in this document.  Patient's Rights: You have the right to refuse treatment. You also have the right to change your mind, even after initially having agreed to have the treatment done. However, under this last option, if you wait until the last second to change your mind, you may be charged for the materials used up to that point.  Introduction: Medicine is not an Chief Strategy Officer. Everything in Medicine, including the lack of treatment(s), carries the potential for danger, harm, or loss (which is by definition: Risk). In Medicine, a complication is a secondary problem, condition,  or disease that can aggravate an already existing one. All treatments carry the risk of possible complications. The fact that a side effects or complications occurs, does not imply that the treatment was conducted incorrectly. It must be clearly understood that these can happen even when everything is done following the highest safety standards.  No treatment: You can choose not to proceed with the proposed treatment alternative. The "PRO(s)" would include: avoiding the risk of complications associated with the therapy. The "CON(s)" would include: not getting any of the treatment benefits. These benefits fall under one of three categories: diagnostic; therapeutic; and/or palliative. Diagnostic benefits include: getting information which can ultimately lead to improvement of the disease or symptom(s). Therapeutic benefits are those associated with the successful treatment of the disease. Finally, palliative benefits are those related to the decrease of the primary symptoms, without necessarily curing the condition (example: decreasing the pain from a flare-up of a chronic condition, such as incurable terminal cancer).  General Risks and Complications: These are associated to most interventional treatments. They can occur alone, or in combination. They fall under one of the following six (6) categories: no benefit or worsening of symptoms; bleeding; infection; nerve damage; allergic reactions; and/or death. No benefits or worsening of symptoms: In Medicine there are no guarantees, only probabilities. No healthcare provider can ever guarantee that a medical treatment will work, they can only state the probability that it may. Furthermore, there is always the possibility that the condition may worsen, either directly, or indirectly, as a consequence of the treatment. Bleeding: This is more common if the patient is taking a blood thinner, either prescription or over the counter (example: Goody Powders, Fish oil,  Aspirin, Garlic, etc.), or if suffering a condition associated  with impaired coagulation (example: Hemophilia, cirrhosis of the liver, low platelet counts, etc.). However, even if you do not have one on these, it can still happen. If you have any of these conditions, or take one of these drugs, make sure to notify your treating physician. Infection: This is more common in patients with a compromised immune system, either due to disease (example: diabetes, cancer, human immunodeficiency virus [HIV], etc.), or due to medications or treatments (example: therapies used to treat cancer and rheumatological diseases). However, even if you do not have one on these, it can still happen. If you have any of these conditions, or take one of these drugs, make sure to notify your treating physician. Nerve Damage: This is more common when the treatment is an invasive one, but it can also happen with the use of medications, such as those used in the treatment of cancer. The damage can occur to small secondary nerves, or to large primary ones, such as those in the spinal cord and brain. This damage may be temporary or permanent and it may lead to impairments that can range from temporary numbness to permanent paralysis and/or brain death. Allergic Reactions: Any time a substance or material comes in contact with our body, there is the possibility of an allergic reaction. These can range from a mild skin rash (contact dermatitis) to a severe systemic reaction (anaphylactic reaction), which can result in death. Death: In general, any medical intervention can result in death, most of the time due to an unforeseen complication. ____________________________________________________________________________________________ Epidural Steroid Injection Patient Information  Description: The epidural space surrounds the nerves as they exit the spinal cord.  In some patients, the nerves can be compressed and inflamed by a bulging disc or  a tight spinal canal (spinal stenosis).  By injecting steroids into the epidural space, we can bring irritated nerves into direct contact with a potentially helpful medication.  These steroids act directly on the irritated nerves and can reduce swelling and inflammation which often leads to decreased pain.  Epidural steroids may be injected anywhere along the spine and from the neck to the low back depending upon the location of your pain.   After numbing the skin with local anesthetic (like Novocaine), a small needle is passed into the epidural space slowly.  You may experience a sensation of pressure while this is being done.  The entire block usually last less than 10 minutes.  Conditions which may be treated by epidural steroids:  Low back and leg pain Neck and arm pain Spinal stenosis Post-laminectomy syndrome Herpes zoster (shingles) pain Pain from compression fractures  Preparation for the injection:  Do not eat any solid food or dairy products within 8 hours of your appointment.  You may drink clear liquids up to 3 hours before appointment.  Clear liquids include water, black coffee, juice or soda.  No milk or cream please. You may take your regular medication, including pain medications, with a sip of water before your appointment  Diabetics should hold regular insulin (if taken separately) and take 1/2 normal NPH dos the morning of the procedure.  Carry some sugar containing items with you to your appointment. A driver must accompany you and be prepared to drive you home after your procedure.  Bring all your current medications with your. An IV may be inserted and sedation may be given at the discretion of the physician.   A blood pressure cuff, EKG and other monitors will often be applied during the procedure.  Some patients may need to have extra oxygen administered for a short period. You will be asked to provide medical information, including your allergies, prior to the procedure.   We must know immediately if you are taking blood thinners (like Coumadin/Warfarin)  Or if you are allergic to IV iodine contrast (dye). We must know if you could possible be pregnant.  Possible side-effects: Bleeding from needle site Infection (rare, may require surgery) Nerve injury (rare) Numbness & tingling (temporary) Difficulty urinating (rare, temporary) Spinal headache ( a headache worse with upright posture) Light -headedness (temporary) Pain at injection site (several days) Decreased blood pressure (temporary) Weakness in arm/leg (temporary) Pressure sensation in back/neck (temporary)  Call if you experience: Fever/chills associated with headache or increased back/neck pain. Headache worsened by an upright position. New onset weakness or numbness of an extremity below the injection site Hives or difficulty breathing (go to the emergency room) Inflammation or drainage at the infection site Severe back/neck pain Any new symptoms which are concerning to you  Please note:  Although the local anesthetic injected can often make your back or neck feel good for several hours after the injection, the pain will likely return.  It takes 3-7 days for steroids to work in the epidural space.  You may not notice any pain relief for at least that one week.  If effective, we will often do a series of three injections spaced 3-6 weeks apart to maximally decrease your pain.  After the initial series, we generally will wait several months before considering a repeat injection of the same type.  If you have any questions, please call [phone] [facility]

## 2021-06-04 ENCOUNTER — Ambulatory Visit
Admission: RE | Admit: 2021-06-04 | Discharge: 2021-06-04 | Disposition: A | Discharge status: Home / Self Care | Payer: Medicare Other | Source: Ambulatory Visit | Attending: Student in an Organized Health Care Education/Training Program | Admitting: Student in an Organized Health Care Education/Training Program

## 2021-06-04 ENCOUNTER — Other Ambulatory Visit: Payer: Self-pay

## 2021-06-04 ENCOUNTER — Ambulatory Visit (HOSPITAL_BASED_OUTPATIENT_CLINIC_OR_DEPARTMENT_OTHER): Payer: Medicare Other | Admitting: Student in an Organized Health Care Education/Training Program

## 2021-06-04 ENCOUNTER — Encounter: Payer: Self-pay | Admitting: Student in an Organized Health Care Education/Training Program

## 2021-06-04 VITALS — BP 123/84 | HR 77 | Temp 96.8°F | Resp 20 | Ht 66.0 in | Wt 234.0 lb

## 2021-06-04 DIAGNOSIS — G894 Chronic pain syndrome: Secondary | ICD-10-CM

## 2021-06-04 DIAGNOSIS — M5416 Radiculopathy, lumbar region: Secondary | ICD-10-CM

## 2021-06-04 DIAGNOSIS — G8929 Other chronic pain: Secondary | ICD-10-CM

## 2021-06-04 DIAGNOSIS — M48062 Spinal stenosis, lumbar region with neurogenic claudication: Secondary | ICD-10-CM | POA: Diagnosis present

## 2021-06-04 MED ORDER — IOHEXOL 180 MG/ML  SOLN
INTRAMUSCULAR | Status: AC
  Filled 2021-06-04: qty 20

## 2021-06-04 MED ORDER — ROPIVACAINE HCL 2 MG/ML IJ SOLN
2.0000 mL | Freq: Once | INTRAMUSCULAR | Status: AC
  Administered 2021-06-04: 20 mL via EPIDURAL

## 2021-06-04 MED ORDER — DIAZEPAM 5 MG PO TABS
5.0000 mg | ORAL_TABLET | Freq: Once | ORAL | Status: AC
  Administered 2021-06-04: 5 mg via ORAL

## 2021-06-04 MED ORDER — SODIUM CHLORIDE 0.9% FLUSH
2.0000 mL | Freq: Once | INTRAVENOUS | Status: AC
  Administered 2021-06-04: 10 mL

## 2021-06-04 MED ORDER — ROPIVACAINE HCL 2 MG/ML IJ SOLN
INTRAMUSCULAR | Status: AC
  Filled 2021-06-04: qty 20

## 2021-06-04 MED ORDER — SODIUM CHLORIDE (PF) 0.9 % IJ SOLN
INTRAMUSCULAR | Status: AC
  Filled 2021-06-04: qty 10

## 2021-06-04 MED ORDER — LIDOCAINE HCL 2 % IJ SOLN
20.0000 mL | Freq: Once | INTRAMUSCULAR | Status: AC
  Administered 2021-06-04: 100 mg

## 2021-06-04 MED ORDER — DEXAMETHASONE SODIUM PHOSPHATE 10 MG/ML IJ SOLN
10.0000 mg | Freq: Once | INTRAMUSCULAR | Status: AC
  Administered 2021-06-04: 10 mg

## 2021-06-04 MED ORDER — IOHEXOL 180 MG/ML  SOLN
10.0000 mL | Freq: Once | INTRAMUSCULAR | Status: AC
  Administered 2021-06-04: 10 mL via EPIDURAL

## 2021-06-04 MED ORDER — DEXAMETHASONE SODIUM PHOSPHATE 10 MG/ML IJ SOLN
INTRAMUSCULAR | Status: AC
  Filled 2021-06-04: qty 1

## 2021-06-04 MED ORDER — LIDOCAINE HCL (PF) 2 % IJ SOLN
INTRAMUSCULAR | Status: AC
  Filled 2021-06-04: qty 5

## 2021-06-04 MED ORDER — DIAZEPAM 5 MG PO TABS
ORAL_TABLET | ORAL | Status: AC
  Filled 2021-06-04: qty 1

## 2021-06-04 NOTE — Progress Notes (Signed)
Safety precautions to be maintained throughout the outpatient stay will include: orient to surroundings, keep bed in low position, maintain call bell within reach at all times, provide assistance with transfer out of bed and ambulation.  

## 2021-06-04 NOTE — Patient Instructions (Signed)

## 2021-06-04 NOTE — Progress Notes (Signed)
PROVIDER NOTE: Information contained herein reflects review and annotations entered in association with encounter. Interpretation of such information and data should be left to medically-trained personnel. Information provided to patient can be located elsewhere in the medical record under "Patient Instructions". Document created using STT-dictation technology, any transcriptional errors that may result from process are unintentional.    Patient: Elizabeth [name]  Service Category: Procedure  Provider: Gillis Santa, MD  DOB: Apr 16, 1966  DOS: 06/04/2021  Location: [facility]  MRN: [id number]  Setting: Ambulatory - outpatient  Referring Provider: Myles Gip, DO  Type: Established Patient  Specialty: Interventional Pain Management  PCP: Myles Gip, DO   Primary Reason for Visit: Interventional Pain Management Treatment. CC: Back Pain (Lumbar bilateral )  Procedure:          Anesthesia, Analgesia, Anxiolysis:  Type: Therapeutic Inter-Laminar Epidural Steroid Injection  #1  Region: Lumbar Level: L4-5 Level. Laterality: Midline         Type: Local Anesthesia with PO Valium  Local Anesthetic: Lidocaine 1-2%  Position: Prone with head of the table was raised to facilitate breathing.   Indications: 1. Chronic radicular lumbar pain   2. Spinal stenosis, lumbar region, with neurogenic claudication   3. Chronic pain syndrome    Pain Score: Pre-procedure: 4 /10 Post-procedure: 0-No pain/10   Pre-op H&P Assessment:  Elizabeth [name] is a 55 y.o. (year old), female patient, seen today for interventional treatment. She  has a past surgical history that includes Tubal ligation; Esophagogastroduodenoscopy (egd) with esophageal dilation; Cholecystectomy (1997); Colonoscopy with propofol (N/A, 09/07/2015); Esophagogastroduodenoscopy (egd) with propofol (N/A, 09/07/2015); polypectomy (N/A, 09/07/2015); Partial colectomy (N/A, 01/24/2016); Bowel resection; Colonoscopy with propofol (N/A,  05/16/2020); Breast biopsy (Right, 12/22/2019); and Breast biopsy. Elizabeth [name] has a current medication list which includes the following prescription(s): accu-chek fastclix lancets, aspirin, atorvastatin, azelastine, b-d ins syr ultrafine 1cc/31g, cholecalciferol, clobetasol ointment, empagliflozin, fluticasone, folic acid, gabapentin, accu-chek guide, linzess, metformin, methotrexate (pf), multiple vitamins-minerals, olanzapine, omega-3 fatty acids, OVER THE COUNTER MEDICATION, trazodone, vitamin a, vitamin b-12, xiidra, zinc, and tizanidine. Her primarily concern today is the Back Pain (Lumbar bilateral )  Initial Vital Signs:  Pulse/HCG Rate: 77ECG Heart Rate: 85 Temp: (!) 96.8 F (36 C) Resp: 16 BP: 140/89 SpO2: 99 %  BMI: Estimated body mass index is 37.77 kg/m as calculated from the following:   Height as of this encounter: 5\' 6"  (1.676 m).   Weight as of this encounter: 234 lb (106.1 kg).  Risk Assessment: Allergies: Reviewed. She is allergic to ace inhibitors, enalapril, penicillins, and statins.  Allergy Precautions: None required Coagulopathies: Reviewed. None identified.  Blood-thinner therapy: None at this time Active Infection(s): Reviewed. None identified. Elizabeth [name] is afebrile  Site Confirmation: Elizabeth [name] was asked to confirm the procedure and laterality before marking the site Procedure checklist: Completed Consent: Before the procedure and under the influence of no sedative(s), amnesic(s), or anxiolytics, the patient was informed of the treatment options, risks and possible complications. To fulfill our ethical and legal obligations, as recommended by the American Medical Association's Code of Ethics, I have informed the patient of my clinical impression; the nature and purpose of the treatment or procedure; the risks, benefits, and possible complications of the intervention; the alternatives, including doing nothing; the risk(s) and benefit(s) of the alternative  treatment(s) or procedure(s); and the risk(s) and benefit(s) of doing nothing. The patient was provided information about the general risks and possible complications associated with the procedure. These may include,  but are not limited to: failure to achieve desired goals, infection, bleeding, organ or nerve damage, allergic reactions, paralysis, and death. In addition, the patient was informed of those risks and complications associated to Spine-related procedures, such as failure to decrease pain; infection (i.e.: Meningitis, epidural or intraspinal abscess); bleeding (i.e.: epidural hematoma, subarachnoid hemorrhage, or any other type of intraspinal or peri-dural bleeding); organ or nerve damage (i.e.: Any type of peripheral nerve, nerve root, or spinal cord injury) with subsequent damage to sensory, motor, and/or autonomic systems, resulting in permanent pain, numbness, and/or weakness of one or several areas of the body; allergic reactions; (i.e.: anaphylactic reaction); and/or death. Furthermore, the patient was informed of those risks and complications associated with the medications. These include, but are not limited to: allergic reactions (i.e.: anaphylactic or anaphylactoid reaction(s)); adrenal axis suppression; blood sugar elevation that in diabetics may result in ketoacidosis or comma; water retention that in patients with history of congestive heart failure may result in shortness of breath, pulmonary edema, and decompensation with resultant heart failure; weight gain; swelling or edema; medication-induced neural toxicity; particulate matter embolism and blood vessel occlusion with resultant organ, and/or nervous system infarction; and/or aseptic necrosis of one or more joints. Finally, the patient was informed that Medicine is not an exact science; therefore, there is also the possibility of unforeseen or unpredictable risks and/or possible complications that may result in a catastrophic  outcome. The patient indicated having understood very clearly. We have given the patient no guarantees and we have made no promises. Enough time was given to the patient to ask questions, all of which were answered to the patient's satisfaction. Ms. Hosein has indicated that she wanted to continue with the procedure. Attestation: I, the ordering provider, attest that I have discussed with the patient the benefits, risks, side-effects, alternatives, likelihood of achieving goals, and potential problems during recovery for the procedure that I have provided informed consent. Date  Time: 06/04/2021  8:40 AM  Pre-Procedure Preparation:  Monitoring: As per clinic protocol. Respiration, ETCO2, SpO2, BP, heart rate and rhythm monitor placed and checked for adequate function Safety Precautions: Patient was assessed for positional comfort and pressure points before starting the procedure. Time-out: I initiated and conducted the "Time-out" before starting the procedure, as per protocol. The patient was asked to participate by confirming the accuracy of the "Time Out" information. Verification of the correct person, site, and procedure were performed and confirmed by me, the nursing staff, and the patient. "Time-out" conducted as per Joint Commission's Universal Protocol (UP.01.01.01). Time: 0921  Description of Procedure:          Target Area: The interlaminar space, initially targeting the lower laminar border of the superior vertebral body. Approach: Paramedial approach. Area Prepped: Entire Posterior Lumbar Region DuraPrep (Iodine Povacrylex [0.7% available iodine] and Isopropyl Alcohol, 74% w/w) Safety Precautions: Aspiration looking for blood return was conducted prior to all injections. At no point did we inject any substances, as a needle was being advanced. No attempts were made at seeking any paresthesias. Safe injection practices and needle disposal techniques used. Medications properly checked for  expiration dates. SDV (single dose vial) medications used. Description of the Procedure: Protocol guidelines were followed. The procedure needle was introduced through the skin, ipsilateral to the reported pain, and advanced to the target area. Bone was contacted and the needle walked caudad, until the lamina was cleared. The epidural space was identified using "loss-of-resistance technique" with 2-3 ml of PF-NaCl (0.9% NSS), in a 5cc LOR  glass syringe.  Vitals:   06/04/21 0844 06/04/21 0924 06/04/21 0928  BP: 140/89 129/89 123/84  Pulse: 77    Resp: 16 19 20   Temp: (!) 96.8 F (36 C)    TempSrc: Temporal    SpO2: 99% 98% 96%  Weight: 234 lb (106.1 kg)    Height: 5\' 6"  (1.676 m)      Start Time: 0921 hrs. End Time: 0927 hrs.  Materials:  Needle(s) Type: Epidural needle Gauge: 22G Length: 3.5-in Medication(s): Please see orders for medications and dosing details. 6 cc solution made of 3 cc of preservative-free saline, 2 cc of 0.2% ropivacaine, 1 cc of Decadron 10 mg/cc.  Imaging Guidance (Spinal):          Type of Imaging Technique: Fluoroscopy Guidance (Spinal) Indication(s): Assistance in needle guidance and placement for procedures requiring needle placement in or near specific anatomical locations not easily accessible without such assistance. Exposure Time: Please see nurses notes. Contrast: Before injecting any contrast, we confirmed that the patient did not have an allergy to iodine, shellfish, or radiological contrast. Once satisfactory needle placement was completed at the desired level, radiological contrast was injected. Contrast injected under live fluoroscopy. No contrast complications. See chart for type and volume of contrast used. Fluoroscopic Guidance: I was personally present during the use of fluoroscopy. "Tunnel Vision Technique" used to obtain the best possible view of the target area. Parallax error corrected before commencing the procedure.  "Direction-depth-direction" technique used to introduce the needle under continuous pulsed fluoroscopy. Once target was reached, antero-posterior, oblique, and lateral fluoroscopic projection used confirm needle placement in all planes. Images permanently stored in EMR. Interpretation: I personally interpreted the imaging intraoperatively. Adequate needle placement confirmed in multiple planes. Appropriate spread of contrast into desired area was observed. No evidence of afferent or efferent intravascular uptake. No intrathecal or subarachnoid spread observed. Permanent images saved into the patient's record.   Post-operative Assessment:  Post-procedure Vital Signs:  Pulse/HCG Rate: 7780 Temp: (!) 96.8 F (36 C) Resp: 20 BP: 123/84 SpO2: 96 %  EBL: None  Complications: No immediate post-treatment complications observed by team, or reported by patient.  Note: The patient tolerated the entire procedure well. A repeat set of vitals were taken after the procedure and the patient was kept under observation following institutional policy, for this type of procedure. Post-procedural neurological assessment was performed, showing return to baseline, prior to discharge. The patient was provided with post-procedure discharge instructions, including a section on how to identify potential problems. Should any problems arise concerning this procedure, the patient was given instructions to immediately contact us, at any time, without hesitation. In any case, we plan to contact the patient by telephone for a follow-up status report regarding this interventional procedure.  Comments:  No additional relevant information.   5 out of 5 strength bilateral lower extremity: Plantar flexion, dorsiflexion, knee flexion, knee extension.  Plan of Care  Orders:  Orders Placed This Encounter  Procedures   DG PAIN [facility] C-ARM 1-60 MIN NO REPORT    Intraoperative interpretation by procedural physician at [facility].    Standing Status:   Standing    Number of Occurrences:   1    Order Specific Question:   Reason for exam:    Answer:   Assistance in needle guidance and placement for procedures requiring needle placement in or near specific anatomical locations not easily accessible without such assistance.    Medications ordered for procedure: Meds ordered this encounter  Medications   iohexol (OMNIPAQUE) 180 MG/ML injection 10 mL    Must be Myelogram-compatible. If not available, you may substitute with a water-soluble, non-ionic, hypoallergenic, myelogram-compatible radiological contrast medium.   lidocaine (XYLOCAINE) 2 % (with pres) injection 400 mg   ropivacaine (PF) 2 mg/mL (0.2%) (NAROPIN) injection 2 mL   sodium chloride flush (NS) 0.9 % injection 2 mL   dexamethasone (DECADRON) injection 10 mg   diazepam (VALIUM) tablet 5 mg   Medications administered: We administered iohexol, lidocaine, ropivacaine (PF) 2 mg/mL (0.2%), sodium chloride flush, dexamethasone, and diazepam.  See the medical record for exact dosing, route, and time of administration.  Follow-up plan:   Return in about 4 weeks (around 07/02/2021) for Post Procedure Evaluation, virtual.      L4/L5 ESI 06/04/21 6 cc injected  Recent Visits Date Type Provider Dept  05/21/21 Office Visit Gillis Santa, MD [facility]  04/10/21 Office Visit Gillis Santa, MD [facility]  03/15/21 Office Visit Gillis Santa, MD [facility]  Showing recent visits within past 90 days and meeting all other requirements Today's Visits Date Type Provider Dept  06/04/21 Procedure visit Gillis Santa, MD [facility]  Showing today's visits and meeting all other requirements Future Appointments Date Type Provider Dept  07/03/21 Appointment Gillis Santa, MD [facility]  Showing future appointments within next 90 days and meeting all other requirements Disposition: Discharge home   Discharge (Date  Time): 06/04/2021; 0940 hrs.   Primary Care Physician: Myles Gip, DO Location: [facility] Note by: Gillis Santa, MD Date: 06/04/2021; Time: 9:43 AM  Disclaimer:  Medicine is not an exact science. The only guarantee in medicine is that nothing is guaranteed. It is important to note that the decision to proceed with this intervention was based on the information collected from the patient. The Data and conclusions were drawn from the patient's questionnaire, the interview, and the physical examination. Because the information was provided in large part by the patient, it cannot be guaranteed that it has not been purposely or unconsciously manipulated. Every effort has been made to obtain as much relevant data as possible for this evaluation. It is important to note that the conclusions that lead to this procedure are derived in large part from the available data. Always take into account that the treatment will also be dependent on availability of resources and existing treatment guidelines, considered by other Pain Management Practitioners as being common knowledge and practice, at the time of the intervention. For Medico-Legal purposes, it is also important to point out that variation in procedural techniques and pharmacological choices are the acceptable norm. The indications, contraindications, technique, and results of the above procedure should only be interpreted and judged by a Board-Certified Interventional Pain Specialist with extensive familiarity and expertise in the same exact procedure and technique.

## 2021-06-05 ENCOUNTER — Telehealth: Payer: Self-pay

## 2021-06-05 MED ORDER — TIZANIDINE HCL 4 MG PO TABS: 4.0000 mg | ORAL_TABLET | Freq: Every evening | ORAL | 2 refills | Status: DC | PRN

## 2021-06-05 NOTE — Addendum Note (Signed)
Addended by: Gillis Santa on: 06/05/2021 08:35 AM   Modules accepted: Orders

## 2021-06-05 NOTE — Telephone Encounter (Signed)
Post procedure phone call.  Patient states she is doing well.  

## 2021-06-22 ENCOUNTER — Telehealth: Payer: Self-pay | Admitting: Family Medicine

## 2021-06-22 NOTE — Telephone Encounter (Signed)
Patient states empagliflozin (JARDIANCE) 10 MG TABS tablet caused her to have a yeast infection and OTC medication is not working seeking a prescription to treat her yeast infection   [place], [place] [facility] Phone:  [phone]  Fax:  [phone]

## 2021-06-25 NOTE — Telephone Encounter (Signed)
Please advise 

## 2021-06-26 ENCOUNTER — Other Ambulatory Visit: Payer: Self-pay | Admitting: Family Medicine

## 2021-06-26 MED ORDER — FLUCONAZOLE 150 MG PO TABS: 150.0000 mg | ORAL_TABLET | Freq: Once | ORAL | 0 refills | Status: AC

## 2021-06-28 ENCOUNTER — Other Ambulatory Visit: Payer: Self-pay | Admitting: Family Medicine

## 2021-06-28 MED ORDER — ACCU-CHEK FASTCLIX LANCETS MISC: 0 refills | Status: DC

## 2021-06-28 NOTE — Telephone Encounter (Signed)
Medication Refill - Medication: Accu-Chek FastClix Lancets MISC  Has the patient contacted their pharmacy? Yes.   Rolan Bucco, from pharmacy, calling and is requesting to have this refilled for pt. Please advise.  (Agent: If no, request that the patient contact the pharmacy for the refill.) (Agent: If yes, when and what did the pharmacy advise?)  Preferred Pharmacy (with phone number or street name):  [facility] [place] [place] [facility]  [address] [place] [address]  Phone: [phone] Fax: [phone]  Hours: Not open 24 hours    Agent: Please be advised that RX refills may take up to 3 business days. We ask that you follow-up with your pharmacy.

## 2021-07-02 ENCOUNTER — Encounter: Payer: Self-pay | Admitting: Family Medicine

## 2021-07-02 ENCOUNTER — Other Ambulatory Visit: Payer: Self-pay

## 2021-07-02 ENCOUNTER — Ambulatory Visit (INDEPENDENT_AMBULATORY_CARE_PROVIDER_SITE_OTHER): Payer: Medicare Other | Admitting: Family Medicine

## 2021-07-02 VITALS — BP 132/84 | HR 97 | Temp 98.3°F | Resp 16 | Ht 66.0 in | Wt 229.7 lb

## 2021-07-02 DIAGNOSIS — E1165 Type 2 diabetes mellitus with hyperglycemia: Secondary | ICD-10-CM

## 2021-07-02 DIAGNOSIS — I10 Essential (primary) hypertension: Secondary | ICD-10-CM

## 2021-07-02 DIAGNOSIS — R7401 Elevation of levels of liver transaminase levels: Secondary | ICD-10-CM | POA: Insufficient documentation

## 2021-07-02 DIAGNOSIS — M5416 Radiculopathy, lumbar region: Secondary | ICD-10-CM | POA: Diagnosis not present

## 2021-07-02 DIAGNOSIS — G8929 Other chronic pain: Secondary | ICD-10-CM

## 2021-07-02 LAB — COMPREHENSIVE METABOLIC PANEL
AG Ratio: 1.5 (calc) (ref 1.0–2.5)
ALT: 23 U/L (ref 6–29)
AST: 15 U/L (ref 10–35)
Albumin: 4.3 g/dL (ref 3.6–5.1)
Alkaline phosphatase (APISO): 89 U/L (ref 37–153)
BUN: 12 mg/dL (ref 7–25)
CO2: 27 mmol/L (ref 20–32)
Calcium: 9.9 mg/dL (ref 8.6–10.4)
Chloride: 107 mmol/L (ref 98–110)
Creat: 0.83 mg/dL (ref 0.50–1.03)
Globulin: 2.8 g/dL (calc) (ref 1.9–3.7)
Glucose, Bld: 164 mg/dL — ABNORMAL HIGH (ref 65–99)
Potassium: 4.3 mmol/L (ref 3.5–5.3)
Sodium: 141 mmol/L (ref 135–146)
Total Bilirubin: 0.4 mg/dL (ref 0.2–1.2)
Total Protein: 7.1 g/dL (ref 6.1–8.1)

## 2021-07-02 LAB — POCT GLYCOSYLATED HEMOGLOBIN (HGB A1C): Hemoglobin A1C: 7.5 % — AB (ref 4.0–5.6)

## 2021-07-02 NOTE — Progress Notes (Signed)
   SUBJECTIVE:   CHIEF COMPLAINT / HPI:   Hypertension: - Medications: none - Compliance: n/a - Checking BP at home: yes, 120s SBP - Denies any SOB, CP, vision changes, LE edema, medication SEs, or symptoms of hypotension - Diet: portion control.  - Exercise: trying to stay active, community garden  Diabetes, Type 2 - Last A1c 9.2 01/2021 - Medications: metformin '1000mg'$  BID, jardiance '10mg'$  - Compliance: good - Checking BG at home: occasionally, 110s - Diet: see above - Exercise: see above - Eye exam: UTD - Foot exam: UTD - Microalbumin: UTD - Statin: yes - Denies symptoms of hypoglycemia, polyuria, polydipsia, numbness extremities, foot ulcers/trauma  Back pain - arthritis with stenosis. Receiving steroid injections, radicular sx improved.   Elevated ALT - mild, chronic. Prior hep panel unremarkable. CT A/P 2016 with mod diffuse hepatic steatosis.   OBJECTIVE:   BP 132/84   Pulse 97   Temp 98.3 F (36.8 C) (Oral)   Resp 16   Ht '5\' 6"'$  (1.676 m)   Wt 229 lb 11.2 oz (104.2 kg)   LMP 07/09/2014   SpO2 99%   BMI 37.07 kg/m   Gen: overweight, in NAD Card: Reg rate Lungs: comfortable WOB on RA Ext: no edema  ASSESSMENT/PLAN:   Hypertension goal BP (blood pressure) < 130/80 SBP 130s in office today, home measurements wnl. No changes today. Obtaining bmp.  Type 2 diabetes mellitus with hyperglycemia, without long-term current use of insulin (HCC) a1c much improved, 7.5 since initiation of jardiance. Continue current regimen. F/u in 3 mths.  Elevated ALT measurement 2/2 fatty liver, repeat labs today. Recommend weight loss.   Chronic radicular lumbar pain Improved with steroid injections, continue.   F/u in 3 months.  Myles Gip, DO

## 2021-07-02 NOTE — Assessment & Plan Note (Signed)
2/2 fatty liver, repeat labs today. Recommend weight loss.

## 2021-07-02 NOTE — Assessment & Plan Note (Signed)
a1c much improved, 7.5 since initiation of jardiance. Continue current regimen. F/u in 3 mths.

## 2021-07-02 NOTE — Patient Instructions (Signed)
It was great to see you!  Our plans for today:  - No changes to your medications today. - We are checking some labs today, we will release these results to your MyChart.  Take care and seek immediate care sooner if you develop any concerns.   Dr. Michelle Wnek  

## 2021-07-02 NOTE — Assessment & Plan Note (Signed)
SBP 130s in office today, home measurements wnl. No changes today. Obtaining bmp.

## 2021-07-02 NOTE — Assessment & Plan Note (Signed)
Improved with steroid injections, continue.

## 2021-07-03 ENCOUNTER — Ambulatory Visit
Payer: Medicare Other | Attending: Student in an Organized Health Care Education/Training Program | Admitting: Student in an Organized Health Care Education/Training Program

## 2021-07-03 ENCOUNTER — Other Ambulatory Visit: Payer: Self-pay | Admitting: Family Medicine

## 2021-07-03 ENCOUNTER — Encounter: Payer: Self-pay | Admitting: Student in an Organized Health Care Education/Training Program

## 2021-07-03 DIAGNOSIS — G8929 Other chronic pain: Secondary | ICD-10-CM | POA: Diagnosis not present

## 2021-07-03 DIAGNOSIS — G894 Chronic pain syndrome: Secondary | ICD-10-CM

## 2021-07-03 DIAGNOSIS — M48062 Spinal stenosis, lumbar region with neurogenic claudication: Secondary | ICD-10-CM | POA: Diagnosis not present

## 2021-07-03 DIAGNOSIS — M5416 Radiculopathy, lumbar region: Secondary | ICD-10-CM | POA: Diagnosis not present

## 2021-07-03 NOTE — Progress Notes (Signed)
Patient: Elizabeth [name]  Service Category: E/M  Provider: Gillis Santa, MD  DOB: Jan 23, 1966  DOS: 07/03/2021  Location: Office  MRN: [id number]  Setting: Ambulatory outpatient  Referring Provider: Myles Gip, DO  Type: Established Patient  Specialty: Interventional Pain Management  PCP: Myles Gip, DO  Location: Home  Delivery: TeleHealth     Virtual Encounter - Pain Management PROVIDER NOTE: Information contained herein reflects review and annotations entered in association with encounter. Interpretation of such information and data should be left to medically-trained personnel. Information provided to patient can be located elsewhere in the medical record under "Patient Instructions". Document created using STT-dictation technology, any transcriptional errors that may result from process are unintentional.    Contact & Pharmacy Preferred: [phone] Home: [phone] (home) Mobile: [phone] (mobile) E-mail: [email or web address] [place], [place] [address] AT [facility] [place] [address] Phone: [phone] Fax: [phone]   Pre-screening  Elizabeth [name] offered "in-person" vs "virtual" encounter. She indicated preferring virtual for this encounter.   Reason COVID-19*  Social distancing based on CDC and AMA recommendations.   I contacted Elizabeth [name] on 07/03/2021 via video conference.      I clearly identified myself as Gillis Santa, MD. I verified that I was speaking with the correct person using two identifiers (Name: Elizabeth [name], and date of birth: 1966-08-20).  Consent I sought verbal advanced consent from Elizabeth [name] for virtual visit interactions. I informed Elizabeth [name] of possible security and privacy concerns, risks, and limitations associated with providing "not-in-person" medical evaluation and management services. I also informed Elizabeth [name] of the availability of "in-person" appointments.  Finally, I informed her that there would be a charge for the virtual visit and that she could be  personally, fully or partially, financially responsible for it. Elizabeth [name] expressed understanding and agreed to proceed.   Historic Elements   Elizabeth [name] is a 55 y.o. year old, female patient evaluated today after our last contact on 06/04/2021. Elizabeth [name]  has a past medical history of Bipolar 1 disorder ([place]), Depression, Diabetes mellitus without complication ([place]), Diverticulosis, GERD (gastroesophageal reflux disease), Heart murmur, Hepatitis, Hypertension goal BP (blood pressure) < 130/80 (01/17/2016), Insomnia, Irritable bowel syndrome, Motion sickness, Obesity (BMI 30-39.9) (07/03/2015), RA (rheumatoid arthritis) ([place]), and Sciatica. She also  has a past surgical history that includes Tubal ligation; Esophagogastroduodenoscopy (egd) with esophageal dilation; Cholecystectomy (1997); Colonoscopy with propofol (N/A, 09/07/2015); Esophagogastroduodenoscopy (egd) with propofol (N/A, 09/07/2015); polypectomy (N/A, 09/07/2015); Partial colectomy (N/A, 01/24/2016); Bowel resection; Colonoscopy with propofol (N/A, 05/16/2020); Breast biopsy (Right, 12/22/2019); and Breast biopsy. Elizabeth [name] has a current medication list which includes the following prescription(s): aspirin, atorvastatin, azelastine, cholecalciferol, clobetasol ointment, empagliflozin, fluticasone, folic acid, gabapentin, linzess, metformin, methotrexate (pf), multiple vitamins-minerals, olanzapine, omega-3 fatty acids, OVER THE COUNTER MEDICATION, tizanidine, trazodone, vitamin a, vitamin b-12, xiidra, zinc, accu-chek fastclix lancets, b-d ins syr ultrafine 1cc/31g, and accu-chek guide. She  reports that she quit smoking about 6 years ago. Her smoking use included cigarettes. She has a 1.00 pack-year smoking history. She has never used smokeless tobacco. She reports current alcohol use. She reports that she does not use drugs. Elizabeth [name] is  allergic to ace inhibitors, enalapril, penicillins, and statins.   HPI  Today, she is being contacted for a post-procedure assessment.   Post-Procedure Evaluation  Procedure (06/04/2021):   Type: Therapeutic Inter-Laminar Epidural Steroid Injection  #1  Region: Lumbar Level: L4-5  Level. Laterality: Midline        Anxiolysis: Please see nurses note.  Effectiveness during initial hour after procedure (Ultra-Short Term Relief): 100 %   Local anesthetic used: Long-acting (4-6 hours) Effectiveness: Defined as any analgesic benefit obtained secondary to the administration of local anesthetics. This carries significant diagnostic value as to the etiological location, or anatomical origin, of the pain. Duration of benefit is expected to coincide with the duration of the local anesthetic used.  Effectiveness during initial 4-6 hours after procedure (Short-Term Relief): 100 %   Long-term benefit: Defined as any relief past the pharmacologic duration of the local anesthetics.  Effectiveness past the initial 6 hours after procedure (Long-Term Relief): 100 % (pain relief dependant upon level of activity.)   Benefits, current: Defined as benefit present at the time of this evaluation.   Analgesia:  90-100% better Function: Elizabeth [name] reports improvement in function ROM: Elizabeth [name] reports improvement in ROM   Laboratory Chemistry Profile   Renal Lab Results  Component Value Date   BUN 12 07/02/2021   CREATININE 0.83 07/02/2021   LABCREA 162 07/19/2020   BCR NOT APPLICABLE 07/02/2021   GFRAA 96 09/07/2020   GFRNONAA 83 09/07/2020    Hepatic Lab Results  Component Value Date   AST 15 07/02/2021   ALT 23 07/02/2021   ALBUMIN 4.1 04/01/2017   ALKPHOS 102 04/01/2017   LIPASE 13 09/07/2020    Electrolytes Lab Results  Component Value Date   NA 141 07/02/2021   K 4.3 07/02/2021   CL 107 07/02/2021   CALCIUM 9.9 07/02/2021    Bone Lab Results  Component Value Date   VD25OH 34  07/02/2017    Inflammation (CRP: Acute Phase) (ESR: Chronic Phase) No results found for: CRP, ESRSEDRATE, LATICACIDVEN       Note: Above Lab results reviewed.   Assessment  The primary encounter diagnosis was Chronic radicular lumbar pain. Diagnoses of Spinal stenosis, lumbar region, with neurogenic claudication and Chronic pain syndrome were also pertinent to this visit.  Plan of Care   Excellent pain relief and improvement in functional status after midline L4-L5 epidural steroid injection.  She states that she is able to ambulate more comfortably and has less radiating leg pain.  We will continue to monitor her symptoms and should she have return of radiating leg pain, patient instructed to call our clinic to schedule repeat lumbar epidural steroid injection.  Patient endorsed understanding.  Orders Placed This Encounter  Procedures   Lumbar Epidural Injection    Standing Status:   Standing    Number of Occurrences:   4    Standing Expiration Date:   07/03/2022    Scheduling Instructions:     Procedure: Interlaminar Lumbar Epidural Steroid injection (LESI)            Laterality: Midline     Sedation: Patient's choice.          Patient will call PRN    Order Specific Question:   Where will this procedure be performed?    Answer:   [facility]      Follow-up plan:   Return if symptoms worsen or fail to improve.     L4/L5 ESI 06/04/21 6 cc injected   Recent Visits Date Type Provider Dept  06/04/21 Procedure visit , , MD [facility]  05/21/21 Office Visit , , MD [facility]  04/10/21 Office Visit , , MD [facility]  Showing recent visits within past   90 days and meeting all other requirements Today's Visits Date Type Provider Dept  07/03/21 Telemedicine , , MD [facility]  Showing today's visits and meeting all other requirements Future Appointments No visits were found meeting  these conditions. Showing future appointments within next 90 days and meeting all other requirements I discussed the assessment and treatment plan with the patient. The patient was provided an opportunity to ask questions and all were answered. The patient agreed with the plan and demonstrated an understanding of the instructions.  Patient advised to call back or seek an in-person evaluation if the symptoms or condition worsens.  Duration of encounter: 20 minutes.  Note by:  , MD Date: 07/03/2021; Time: 11:34 AM  

## 2021-07-24 ENCOUNTER — Other Ambulatory Visit: Payer: Self-pay

## 2021-07-24 MED ORDER — ACCU-CHEK GUIDE VI STRP: ORAL_STRIP | 5 refills | Status: DC

## 2021-08-13 ENCOUNTER — Other Ambulatory Visit: Payer: Self-pay | Admitting: Student in an Organized Health Care Education/Training Program

## 2021-08-13 DIAGNOSIS — G894 Chronic pain syndrome: Secondary | ICD-10-CM

## 2021-08-13 DIAGNOSIS — M48062 Spinal stenosis, lumbar region with neurogenic claudication: Secondary | ICD-10-CM

## 2021-08-13 DIAGNOSIS — G8929 Other chronic pain: Secondary | ICD-10-CM

## 2021-08-28 ENCOUNTER — Other Ambulatory Visit: Payer: Self-pay | Admitting: Student in an Organized Health Care Education/Training Program

## 2021-08-28 DIAGNOSIS — G894 Chronic pain syndrome: Secondary | ICD-10-CM

## 2021-08-29 ENCOUNTER — Telehealth: Payer: Self-pay

## 2021-08-29 NOTE — Telephone Encounter (Signed)
Copied from [place] [id number]. Topic: Appointment Scheduling - Scheduling Inquiry for Clinic >> Aug 29, 2021  3:38 PM Erick Blinks wrote: Reason for CRM: Pt would like to establish with Dr. Jacinto Reap, pt's husband is currently established Best contact: [phone]

## 2021-08-29 NOTE — Telephone Encounter (Signed)
Can we reach back out to patient, Dr. B is not accepting new patients at this time, Im unsure if new PA there are to start in next month are though. KW

## 2021-08-30 ENCOUNTER — Telehealth: Payer: Self-pay | Admitting: Student in an Organized Health Care Education/Training Program

## 2021-08-30 ENCOUNTER — Telehealth: Payer: Self-pay

## 2021-08-30 DIAGNOSIS — M48062 Spinal stenosis, lumbar region with neurogenic claudication: Secondary | ICD-10-CM

## 2021-08-30 DIAGNOSIS — G894 Chronic pain syndrome: Secondary | ICD-10-CM

## 2021-08-30 DIAGNOSIS — M5416 Radiculopathy, lumbar region: Secondary | ICD-10-CM

## 2021-08-30 DIAGNOSIS — G8929 Other chronic pain: Secondary | ICD-10-CM

## 2021-08-30 MED ORDER — TIZANIDINE HCL 4 MG PO TABS: 4.0000 mg | ORAL_TABLET | Freq: Every evening | ORAL | 2 refills | Status: DC | PRN

## 2021-08-30 MED ORDER — GABAPENTIN 600 MG PO TABS: 600.0000 mg | ORAL_TABLET | Freq: Three times a day (TID) | ORAL | 5 refills | Status: DC

## 2021-08-30 NOTE — Telephone Encounter (Signed)
She called and said she also needs Tizanadine

## 2021-08-30 NOTE — Addendum Note (Signed)
Addended by: Gillis Santa on: 08/30/2021 11:47 AM   Modules accepted: Orders

## 2021-08-30 NOTE — Telephone Encounter (Signed)
Dr Holley Raring, patient states she is out of gabapentin.  I do not see that it was reordered at her 7-26 appointment.

## 2021-09-17 ENCOUNTER — Encounter: Payer: Self-pay | Admitting: General Surgery

## 2021-09-23 ENCOUNTER — Other Ambulatory Visit: Payer: Self-pay | Admitting: Family Medicine

## 2021-09-23 NOTE — Telephone Encounter (Signed)
Requested Prescriptions  Pending Prescriptions Disp Refills  . Accu-Chek FastClix Lancets MISC [Pharmacy Med Name: ACCU-CHEK FASTCLIX LANCETS 102'S] 102 each 0    Sig: USE TO [place] DAILY     Endocrinology: Diabetes - Testing Supplies Passed - 09/23/2021  3:39 AM      Passed - Valid encounter within last 12 months    Recent Outpatient Visits          2 months ago Hypertension goal BP (blood pressure) < 130/80   [place], DO   7 months ago Hypertension goal BP (blood pressure) < 130/80   [facility] Rory Percy M, DO   7 months ago Type 2 diabetes mellitus with hyperglycemia, without long-term current use of insulin [facility])   [place], DO   9 months ago Acute bronchitis, unspecified organism   [facility] Delsa Grana, PA-C   11 months ago Type 2 diabetes mellitus with hyperglycemia, without long-term current use of insulin [facility])   [facility] Towanda Malkin, MD      Future Appointments            In 2 months Delsa Grana, PA-C [facility] [place]   In 6 months  [facility], [facility]

## 2021-09-24 ENCOUNTER — Other Ambulatory Visit: Payer: Self-pay | Admitting: Family Medicine

## 2021-09-24 NOTE — Telephone Encounter (Signed)
Call to pharmacy- Rx was received yesterday.

## 2021-10-12 ENCOUNTER — Other Ambulatory Visit: Payer: Self-pay | Admitting: Family Medicine

## 2021-10-12 DIAGNOSIS — E119 Type 2 diabetes mellitus without complications: Secondary | ICD-10-CM

## 2021-10-24 ENCOUNTER — Ambulatory Visit: Payer: Medicare Other | Admitting: Family Medicine

## 2021-10-29 ENCOUNTER — Telehealth: Payer: Self-pay

## 2021-10-29 ENCOUNTER — Other Ambulatory Visit: Payer: Self-pay

## 2021-10-29 ENCOUNTER — Encounter: Payer: Self-pay | Admitting: Family Medicine

## 2021-10-29 ENCOUNTER — Ambulatory Visit (INDEPENDENT_AMBULATORY_CARE_PROVIDER_SITE_OTHER): Payer: Medicare Other | Admitting: Family Medicine

## 2021-10-29 VITALS — BP 118/72 | HR 87 | Temp 98.2°F | Resp 16 | Ht 66.0 in | Wt 217.5 lb

## 2021-10-29 DIAGNOSIS — K589 Irritable bowel syndrome without diarrhea: Secondary | ICD-10-CM

## 2021-10-29 DIAGNOSIS — M05741 Rheumatoid arthritis with rheumatoid factor of right hand without organ or systems involvement: Secondary | ICD-10-CM | POA: Diagnosis not present

## 2021-10-29 DIAGNOSIS — E1165 Type 2 diabetes mellitus with hyperglycemia: Secondary | ICD-10-CM

## 2021-10-29 DIAGNOSIS — M05742 Rheumatoid arthritis with rheumatoid factor of left hand without organ or systems involvement: Secondary | ICD-10-CM

## 2021-10-29 DIAGNOSIS — I10 Essential (primary) hypertension: Secondary | ICD-10-CM

## 2021-10-29 DIAGNOSIS — G894 Chronic pain syndrome: Secondary | ICD-10-CM

## 2021-10-29 LAB — POCT GLYCOSYLATED HEMOGLOBIN (HGB A1C): Hemoglobin A1C: 7.3 % — AB (ref 4.0–5.6)

## 2021-10-29 MED ORDER — LINACLOTIDE 290 MCG PO CAPS: ORAL_CAPSULE | ORAL | 1 refills | Status: DC

## 2021-10-29 NOTE — Assessment & Plan Note (Signed)
Doing well without meds. Continue to monitor.

## 2021-10-29 NOTE — Assessment & Plan Note (Signed)
Doing well on current regimen, no changes made today. 

## 2021-10-29 NOTE — Addendum Note (Signed)
Addended by: Myles Gip on: 10/29/2021 02:02 PM   Modules accepted: Orders

## 2021-10-29 NOTE — Assessment & Plan Note (Signed)
Contributing to diabetes, HTN, back pain. Congratulated efforts with weight loss, continue efforts.

## 2021-10-29 NOTE — Assessment & Plan Note (Signed)
Continue to follow with pain management °

## 2021-10-29 NOTE — Patient Instructions (Signed)
It was great to see you!  Our plans for today:  - No changes to your medicaitons.  - We are checking some labs today, we will release these results to your MyChart.  Take care and seek immediate care sooner if you develop any concerns.   Dr. Ky Barban

## 2021-10-29 NOTE — Progress Notes (Signed)
   SUBJECTIVE:   CHIEF COMPLAINT / HPI:   Hypertension: - Medications: none - Compliance: n/a - Checking BP at home: no - Denies any LE edema, medication SEs, or symptoms of hypotension  Diabetes, Type 2 - Last A1c 7.5 06/2021 - Medications: metformin 1000mg  BID, jardiance 10mg  - Compliance: good - Checking BG at home: yes, 100-140s - Diet: cut back on candy - Exercise: volunteers in community garden - Eye exam: UTD - Foot exam: UTD - Microalbumin: due - Statin: yes - PNA vaccine:  - Denies symptoms of hypoglycemia, polyuria, polydipsia, numbness extremities, foot ulcers/trauma  RA - follows with Rheumatology, last appt 08/24/21. On methotrexate.  IBS - on linzess. Doing well.  Back pain - follows with pain management, receiving epidural steroid injections. On zanaflex, gabapentin. Has improved since losing weight.   OBJECTIVE:   BP 118/72   Pulse 87   Temp 98.2 F (36.8 C)   Resp 16   Ht 5\' 6"  (1.676 m)   Wt 217 lb 8 oz (98.7 kg)   LMP 07/09/2014   SpO2 97%   BMI 35.11 kg/m   Gen: well appearing, in NAD Card: RRR Lungs: CTAB Ext: WWP, no edema  ASSESSMENT/PLAN:   Hypertension goal BP (blood pressure) < 130/80 Doing well without meds. Continue to monitor.  IBS (irritable bowel syndrome) Doing well on current regimen, no changes made today.  Type 2 diabetes mellitus with hyperglycemia, without long-term current use of insulin ([facility]) Doing well on current regimen, no changes made today. Expect continued improvement in glycemic control as she continues to lose weight. Congratulated efforts. Recommended checking with pharmacy regarding PNA vaccine.  Rheumatoid arthritis involving both hands ([place]) Doing well on current regimen, no changes made today. Continue to follow with rheum.  Chronic pain syndrome Continue to follow with pain management  Morbid obesity ([place]) Contributing to diabetes, HTN, back pain. Congratulated efforts with weight loss, continue  efforts.     Myles Gip, DO

## 2021-10-29 NOTE — Assessment & Plan Note (Signed)
Doing well on current regimen, no changes made today. Continue to follow with rheum.

## 2021-10-29 NOTE — Telephone Encounter (Signed)
Copied from [place] [id number]. Topic: Quick Communication - Rx Refill/Question >> Oct 29, 2021  9:51 AM Loma Boston wrote: linaclotide Rolan Lipa) 290 MCG CAPS capsule 90 capsule 1 10/29/2021   Sig: TAKE 1 CAPSULE(290 MCG) BY MOUTH [facility] called and is requesting a resend, pls rewrite "daily before and breakfast"

## 2021-10-29 NOTE — Assessment & Plan Note (Addendum)
Doing well on current regimen, no changes made today. Expect continued improvement in glycemic control as she continues to lose weight. Congratulated efforts. Recommended checking with pharmacy regarding PNA vaccine.

## 2021-10-30 ENCOUNTER — Other Ambulatory Visit: Payer: Self-pay | Admitting: Family Medicine

## 2021-10-30 LAB — MICROALBUMIN / CREATININE URINE RATIO
Creatinine, Urine: 98 mg/dL (ref 20–275)
Microalb Creat Ratio: 3 mcg/mg creat (ref <30)
Microalb, Ur: 0.3 mg/dL

## 2021-10-30 LAB — LIPID PANEL
Cholesterol: 135 mg/dL (ref <200)
HDL: 43 mg/dL — ABNORMAL LOW (ref >=50)
LDL Cholesterol (Calc): 66 mg/dL (calc)
Non-HDL Cholesterol (Calc): 92 mg/dL (calc) (ref <130)
Total CHOL/HDL Ratio: 3.1 (calc) (ref <5.0)
Triglycerides: 185 mg/dL — ABNORMAL HIGH (ref <150)

## 2021-10-30 LAB — BASIC METABOLIC PANEL
BUN: 10 mg/dL (ref 7–25)
CO2: 28 mmol/L (ref 20–32)
Calcium: 9.4 mg/dL (ref 8.6–10.4)
Chloride: 109 mmol/L (ref 98–110)
Creat: 0.76 mg/dL (ref 0.50–1.03)
Glucose, Bld: 147 mg/dL — ABNORMAL HIGH (ref 65–99)
Potassium: 4.4 mmol/L (ref 3.5–5.3)
Sodium: 142 mmol/L (ref 135–146)

## 2021-10-30 NOTE — Telephone Encounter (Signed)
Signed yesterday at same pharmacy, duplicate request.

## 2021-10-30 NOTE — Telephone Encounter (Signed)
Duplicate request

## 2021-11-26 ENCOUNTER — Other Ambulatory Visit: Payer: Self-pay | Admitting: Student in an Organized Health Care Education/Training Program

## 2021-11-26 ENCOUNTER — Ambulatory Visit: Payer: Medicare Other | Admitting: Family Medicine

## 2021-11-26 DIAGNOSIS — G894 Chronic pain syndrome: Secondary | ICD-10-CM

## 2021-11-27 ENCOUNTER — Other Ambulatory Visit: Payer: Self-pay | Admitting: Student in an Organized Health Care Education/Training Program

## 2021-11-27 DIAGNOSIS — G894 Chronic pain syndrome: Secondary | ICD-10-CM

## 2021-12-03 ENCOUNTER — Other Ambulatory Visit: Payer: Self-pay | Admitting: Student in an Organized Health Care Education/Training Program

## 2021-12-03 DIAGNOSIS — G894 Chronic pain syndrome: Secondary | ICD-10-CM

## 2021-12-17 ENCOUNTER — Ambulatory Visit
Payer: Medicare Other | Attending: Student in an Organized Health Care Education/Training Program | Admitting: Student in an Organized Health Care Education/Training Program

## 2021-12-17 ENCOUNTER — Encounter: Payer: Self-pay | Admitting: Student in an Organized Health Care Education/Training Program

## 2021-12-17 ENCOUNTER — Telehealth: Payer: Self-pay | Admitting: Student in an Organized Health Care Education/Training Program

## 2021-12-17 DIAGNOSIS — G8929 Other chronic pain: Secondary | ICD-10-CM

## 2021-12-17 DIAGNOSIS — M48062 Spinal stenosis, lumbar region with neurogenic claudication: Secondary | ICD-10-CM

## 2021-12-17 DIAGNOSIS — M5416 Radiculopathy, lumbar region: Secondary | ICD-10-CM | POA: Diagnosis not present

## 2021-12-17 DIAGNOSIS — G894 Chronic pain syndrome: Secondary | ICD-10-CM | POA: Diagnosis not present

## 2021-12-17 MED ORDER — GABAPENTIN 600 MG PO TABS: 600.0000 mg | ORAL_TABLET | Freq: Three times a day (TID) | ORAL | 5 refills | Status: DC

## 2021-12-17 MED ORDER — TIZANIDINE HCL 4 MG PO TABS: 4.0000 mg | ORAL_TABLET | Freq: Every evening | ORAL | 5 refills | Status: DC | PRN

## 2021-12-17 NOTE — Progress Notes (Signed)
Patient: Elizabeth [name]  Service Category: E/M  Provider: Gillis Santa, MD  DOB: 11-01-1966  DOS: 12/17/2021  Location: Office  MRN: [id number]  Setting: Ambulatory outpatient  Referring Provider: Myles Gip, DO  Type: Established Patient  Specialty: Interventional Pain Management  PCP: Myles Gip, DO  Location: Remote location  Delivery: TeleHealth     Virtual Encounter - Pain Management PROVIDER NOTE: Information contained herein reflects review and annotations entered in association with encounter. Interpretation of such information and data should be left to medically-trained personnel. Information provided to patient can be located elsewhere in the medical record under "Patient Instructions". Document created using STT-dictation technology, any transcriptional errors that may result from process are unintentional.    Contact & Pharmacy Preferred: [phone] Home: [phone] (home) Mobile: [phone] (mobile) E-mail: [email or web address]  [address] [place], [place] AT [facility] [place] [address] Phone: [phone] Fax: [phone]   Pre-screening  Ms. Nicole Kindred offered "in-person" vs "virtual" encounter. She indicated preferring virtual for this encounter.   Reason COVID-19*   Social distancing based on CDC and AMA recommendations.   I contacted [name] on 12/17/2021 via telephone.      I clearly identified myself as Gillis Santa, MD. I verified that I was speaking with the correct person using two identifiers (Name: JOSAPHINE SHIMAMOTO, and date of birth: Dec 25, 1965).  Consent I sought verbal advanced consent from [name] for virtual visit interactions. I informed Ms. Pong of possible security and privacy concerns, risks, and limitations associated with providing "not-in-person" medical evaluation and management services. I also informed Ms. Sassone of the availability of "in-person" appointments.  Finally, I informed her that there would be a charge for the virtual visit and that she could be  personally, fully or partially, financially responsible for it. Ms. Standifer expressed understanding and agreed to proceed.   Historic Elements   Ms. FEMALE IAFRATE is a 56 y.o. year old, female patient evaluated today after our last contact on 12/17/2021. Ms. Hilgeman  has a past medical history of Bipolar 1 disorder ([place]), Depression, Diabetes mellitus without complication ([place]), Diverticulosis, GERD (gastroesophageal reflux disease), Heart murmur, Hepatitis, Hypertension goal BP (blood pressure) < 130/80 (01/17/2016), Insomnia, Irritable bowel syndrome, Motion sickness, Obesity (BMI 30-39.9) (07/03/2015), RA (rheumatoid arthritis) ([place]), and Sciatica. She also  has a past surgical history that includes Tubal ligation; Esophagogastroduodenoscopy (egd) with esophageal dilation; Cholecystectomy (1997); Colonoscopy with propofol (N/A, 09/07/2015); Esophagogastroduodenoscopy (egd) with propofol (N/A, 09/07/2015); polypectomy (N/A, 09/07/2015); Partial colectomy (N/A, 01/24/2016); Bowel resection; Colonoscopy with propofol (N/A, 05/16/2020); Breast biopsy (Right, 12/22/2019); and Breast biopsy. Ms. Hewson has a current medication list which includes the following prescription(s): accu-chek fastclix lancets, aspirin, atorvastatin, azelastine, b-d ins syr ultrafine 1cc/31g, accu-chek guide, cholecalciferol, clobetasol ointment, empagliflozin, fluticasone, folic acid, accu-chek guide, linaclotide, metformin, methotrexate (pf), multiple vitamins-minerals, olanzapine, omega-3 fatty acids, OVER THE COUNTER MEDICATION, trazodone, vitamin a, vitamin b-12, xiidra, zinc, gabapentin, and tizanidine. She  reports that she quit smoking about 6 years ago. Her smoking use included cigarettes. She has a 1.00 pack-year smoking history. She has never used smokeless tobacco. She reports current alcohol use. She reports that she does not use drugs.  Ms. Hegler is allergic to ace inhibitors, enalapril, penicillins, and statins.   HPI  Today, she is being contacted for medication management.  Overall Shakara is doing well.  Is endorsing lumbar spine pain that is worse with facet loading.  Is  not having any radiating pain.  Previous epidural was L4-L5 on 06/04/2021 provided significant pain relief in regards to her radiating leg pain which she is still endorsing benefit from.  Refill gabapentin and tizanidine as below.  If focal lumbar spine pain is not improved, patient instructed to contact me to discuss possible diagnostic lumbar facet medial branch nerve blocks. If pain does radiate into her leg, recommend that she reconsider lumbar epidural steroid injection.  Laboratory Chemistry Profile   Renal Lab Results  Component Value Date   BUN 10 10/29/2021   CREATININE 0.76 10/29/2021   LABCREA 98 37/09/6268   BCR NOT APPLICABLE 48/54/6270   GFRAA 96 09/07/2020   GFRNONAA 83 09/07/2020    Hepatic Lab Results  Component Value Date   AST 15 07/02/2021   ALT 23 07/02/2021   ALBUMIN 4.1 04/01/2017   ALKPHOS 102 04/01/2017   LIPASE 13 09/07/2020    Electrolytes Lab Results  Component Value Date   NA 142 10/29/2021   K 4.4 10/29/2021   CL 109 10/29/2021   CALCIUM 9.4 10/29/2021    Bone Lab Results  Component Value Date   VD25OH 34 07/02/2017    Inflammation (CRP: Acute Phase) (ESR: Chronic Phase) No results found for: CRP, ESRSEDRATE, LATICACIDVEN       Note: Above Lab results reviewed.   Assessment  Diagnoses of Chronic radicular lumbar pain, Chronic pain syndrome, and Spinal stenosis, lumbar region, with neurogenic claudication were pertinent to this visit.   Ms. TAYVIA FAUGHNAN has a current medication list which includes the following long-term medication(s): atorvastatin, azelastine, linaclotide, metformin, and gabapentin.  Pharmacotherapy (Medications Ordered): Meds ordered this encounter  Medications   gabapentin  (NEURONTIN) 600 MG tablet    Sig: Take 1 tablet (600 mg total) by mouth 3 (three) times daily.    Dispense:  90 tablet    Refill:  5   tiZANidine (ZANAFLEX) 4 MG tablet    Sig: Take 1 tablet (4 mg total) by mouth at bedtime as needed for muscle spasms.    Dispense:  30 tablet    Refill:  5    Follow-up plan:   Return if symptoms worsen or fail to improve.     L4/L5 ESI 06/04/21 6 cc injected    Recent Visits No visits were found meeting these conditions. Showing recent visits within past 90 days and meeting all other requirements Today's Visits Date Type Provider Dept  12/17/21 Office Visit Gillis Santa, MD [facility]  Showing today's visits and meeting all other requirements Future Appointments No visits were found meeting these conditions. Showing future appointments within next 90 days and meeting all other requirements  I discussed the assessment and treatment plan with the patient. The patient was provided an opportunity to ask questions and all were answered. The patient agreed with the plan and demonstrated an understanding of the instructions.  Patient advised to call back or seek an in-person evaluation if the symptoms or condition worsens.  Duration of encounter: 69mnutes.  Note by: BGillis Santa MD Date: 12/17/2021; Time: 10:40 AM

## 2021-12-17 NOTE — Telephone Encounter (Signed)
I don't know why she would be irate. Last prescribed 08-30-21. Last appt was 07-03-21. Orders were to f/u prn. Do you want to send in script or schedule appt?

## 2021-12-17 NOTE — Telephone Encounter (Signed)
Patient called, very irate about being out of meds and pharmacy says they dont have scripts. Please call patient

## 2021-12-27 ENCOUNTER — Other Ambulatory Visit: Payer: Self-pay | Admitting: Family Medicine

## 2021-12-27 NOTE — Telephone Encounter (Signed)
Requested medication (s) are due for refill today:   yes  Requested medication (s) are on the active medication list:   Yes  Future visit scheduled:   Yes with [facility] Brita Romp at [facility] but been seeing Dr. Rory Percy at your practice and hasn't seen another provider at [facility].   Last ordered: 09/23/2021 #102, 0 refills  Returned because wasn't sure which provider to use being Dr. Ky Barban is no longer there.     Requested Prescriptions  Pending Prescriptions Disp Refills   Accu-Chek FastClix Lancets MISC [Pharmacy Med Name: ACCU-CHEK FASTCLIX LANCETS 102'S] 102 each 0    Sig: USE TO [place] DAILY     Endocrinology: Diabetes - Testing Supplies Passed - 12/27/2021  1:06 PM      Passed - Valid encounter within last 12 months    Recent Outpatient Visits           1 month ago Type 2 diabetes mellitus with hyperglycemia, without long-term current use of insulin [facility])   [place], DO   5 months ago Hypertension goal BP (blood pressure) < 130/80   [place], DO   10 months ago Hypertension goal BP (blood pressure) < 130/80   [facility] Rory Percy M, DO   11 months ago Type 2 diabetes mellitus with hyperglycemia, without long-term current use of insulin [facility])   [place], DO   1 year ago Acute bronchitis, unspecified organism   [facility] Delsa Grana, PA-C       Future Appointments             In 3 months  [facility], [place]   In 3 months Bacigalupo, Dionne Bucy, MD [facility], [place]

## 2022-01-08 ENCOUNTER — Telehealth: Payer: Self-pay | Admitting: Family Medicine

## 2022-01-08 DIAGNOSIS — Z1231 Encounter for screening mammogram for malignant neoplasm of breast: Secondary | ICD-10-CM

## 2022-01-08 NOTE — Telephone Encounter (Signed)
Referral Request - Has patient seen PCP for this complaint? yes *If NO, is insurance requiring patient see PCP for this issue before PCP can refer them? Referral for which specialty:mammogram Preferred provider/office:N/A Reason for referral: annual

## 2022-01-10 ENCOUNTER — Other Ambulatory Visit: Payer: Self-pay | Admitting: Family Medicine

## 2022-01-10 DIAGNOSIS — Z1231 Encounter for screening mammogram for malignant neoplasm of breast: Secondary | ICD-10-CM

## 2022-01-23 ENCOUNTER — Other Ambulatory Visit: Payer: Self-pay | Admitting: Family Medicine

## 2022-01-23 NOTE — Telephone Encounter (Signed)
Requested Prescriptions  Pending Prescriptions Disp Refills   atorvastatin (LIPITOR) 10 MG tablet [Pharmacy Med Name: ATORVASTATIN 10MG TABLETS] 90 tablet 1    Sig: TAKE 1 TABLET(10 MG) BY MOUTH AT BEDTIME     Cardiovascular:  Antilipid - Statins Failed - 01/23/2022  8:13 AM      Failed - Lipid Panel in normal range within the last 12 months    Cholesterol, Total  Date Value Ref Range Status  03/21/2016 181 100 - 199 mg/dL Final   Cholesterol  Date Value Ref Range Status  10/29/2021 135 <200 mg/dL Final   LDL Cholesterol (Calc)  Date Value Ref Range Status  10/29/2021 66 mg/dL (calc) Final    Comment:    Reference range: <100 . Desirable range <100 mg/dL for primary prevention;   <70 mg/dL for patients with CHD or diabetic patients  with > or = 2 CHD risk factors. Marland Kitchen LDL-C is now calculated using the Martin-Hopkins  calculation, which is a validated novel method providing  better accuracy than the Friedewald equation in the  estimation of LDL-C.  Cresenciano Genre et al. Annamaria Helling. 7858;850(27): 2061-2068  (http://education.[facility].com/faq/FAQ164)    HDL  Date Value Ref Range Status  10/29/2021 43 (L) > OR = 50 mg/dL Final  03/21/2016 47 >39 mg/dL Final   Triglycerides  Date Value Ref Range Status  10/29/2021 185 (H) <150 mg/dL Final         Passed - Patient is not pregnant      Passed - Valid encounter within last 12 months    Recent Outpatient Visits          2 months ago Type 2 diabetes mellitus with hyperglycemia, without long-term current use of insulin [facility])   [place], DO   6 months ago Hypertension goal BP (blood pressure) < 130/80   [place], DO   11 months ago Hypertension goal BP (blood pressure) < 130/80   [place], DO   11 months ago Type 2 diabetes mellitus with hyperglycemia, without long-term current use of insulin [facility])   [facility] Rory Percy M, DO   1 year ago Acute bronchitis, unspecified organism   [facility] Delsa Grana, PA-C      Future Appointments            In 2 months  [facility], [place]   In 3 months Bacigalupo, Dionne Bucy, MD [facility], [place] 10 MG TABS tablet [Pharmacy Med Name: JARDIANCE 10MG TABLETS] 90 tablet 0    Sig: TAKE 1 TABLET(10 MG) BY MOUTH DAILY     Endocrinology:  Diabetes - SGLT2 Inhibitors Failed - 01/23/2022  8:13 AM      Failed - eGFR in normal range and within 360 days    GFR, Est African American  Date Value Ref Range Status  09/07/2020 96 > OR = 60 mL/min/1.51m Final   GFR, Est Non African American  Date Value Ref Range Status  09/07/2020 83 > OR = 60 mL/min/1.776mFinal         Passed - Cr in normal range and within 360 days    Creat  Date Value Ref Range Status  10/29/2021 0.76 0.50 - 1.03 mg/dL Final   Creatinine, Urine  Date Value Ref Range Status  10/29/2021 98 20 - 275  mg/dL Final         Passed - HBA1C is between 0 and 7.9 and within 180 days    Hemoglobin A1C  Date Value Ref Range Status  10/29/2021 7.3 (A) 4.0 - 5.6 % Final   Hgb A1c MFr Bld  Date Value Ref Range Status  09/07/2020 8.7 (H) <5.7 % of total Hgb Final    Comment:    For someone without known diabetes, a hemoglobin A1c value of 6.5% or greater indicates that they may have  diabetes and this should be confirmed with a follow-up  test. . For someone with known diabetes, a value <7% indicates  that their diabetes is well controlled and a value  greater than or equal to 7% indicates suboptimal  control. A1c targets should be individualized based on  duration of diabetes, age, comorbid conditions, and  other considerations. . Currently, no consensus exists regarding use of hemoglobin A1c for diagnosis of diabetes for children. Renella Cunas - Valid encounter within last 6  months    Recent Outpatient Visits          2 months ago Type 2 diabetes mellitus with hyperglycemia, without long-term current use of insulin [facility])   [place], DO   6 months ago Hypertension goal BP (blood pressure) < 130/80   [place], DO   11 months ago Hypertension goal BP (blood pressure) < 130/80   [place], [place]   11 months ago Type 2 diabetes mellitus with hyperglycemia, without long-term current use of insulin [facility])   [place], DO   1 year ago Acute bronchitis, unspecified organism   [facility] Delsa Grana, PA-C      Future Appointments            In 2 months  [facility], [place]   In 3 months Bacigalupo, Dionne Bucy, MD [facility], [place]

## 2022-01-25 ENCOUNTER — Other Ambulatory Visit: Payer: Self-pay | Admitting: Family Medicine

## 2022-01-25 NOTE — Telephone Encounter (Signed)
Filled 06/16/28, duplicate request.

## 2022-01-25 NOTE — Telephone Encounter (Signed)
Refusing Jardiance due to Filled 1/91/66, duplicate request. Requested Prescriptions  Pending Prescriptions Disp Refills   JARDIANCE 10 MG TABS tablet [Pharmacy Med Name: JARDIANCE 10MG TABLETS] 90 tablet 0    Sig: TAKE 1 TABLET(10 MG) BY MOUTH DAILY     Endocrinology:  Diabetes - SGLT2 Inhibitors Failed - 01/25/2022  8:03 AM      Failed - eGFR in normal range and within 360 days    GFR, Est African American  Date Value Ref Range Status  09/07/2020 96 > OR = 60 mL/min/1.3m Final   GFR, Est Non African American  Date Value Ref Range Status  09/07/2020 83 > OR = 60 mL/min/1.741mFinal          Passed - Cr in normal range and within 360 days    Creat  Date Value Ref Range Status  10/29/2021 0.76 0.50 - 1.03 mg/dL Final   Creatinine, Urine  Date Value Ref Range Status  10/29/2021 98 20 - 275 mg/dL Final          Passed - HBA1C is between 0 and 7.9 and within 180 days    Hemoglobin A1C  Date Value Ref Range Status  10/29/2021 7.3 (A) 4.0 - 5.6 % Final   Hgb A1c MFr Bld  Date Value Ref Range Status  09/07/2020 8.7 (H) <5.7 % of total Hgb Final    Comment:    For someone without known diabetes, a hemoglobin A1c value of 6.5% or greater indicates that they may have  diabetes and this should be confirmed with a follow-up  test. . For someone with known diabetes, a value <7% indicates  that their diabetes is well controlled and a value  greater than or equal to 7% indicates suboptimal  control. A1c targets should be individualized based on  duration of diabetes, age, comorbid conditions, and  other considerations. . Currently, no consensus exists regarding use of hemoglobin A1c for diagnosis of diabetes for children. . Elizabeth [name] Valid encounter within last 6 months    Recent Outpatient Visits           2 months ago Type 2 diabetes mellitus with hyperglycemia, without long-term current use of insulin [facility]  [place]   6 months ago Hypertension goal BP (blood pressure) < 130/80   [place]   11 months ago Hypertension goal BP (blood pressure) < 130/80   [place] 11 months ago Type 2 diabetes mellitus with hyperglycemia, without long-term current use of insulin ([facility]  [place]   1 year ago Acute bronchitis, unspecified organism   [facility]       Future Appointments             In 2 months  [facility] In 3 months Bacigalupo, AnDionne BucyMD [facility] [place]

## 2022-02-06 ENCOUNTER — Ambulatory Visit (INDEPENDENT_AMBULATORY_CARE_PROVIDER_SITE_OTHER): Payer: Medicare Other | Admitting: Internal Medicine

## 2022-02-06 ENCOUNTER — Encounter: Payer: Self-pay | Admitting: Internal Medicine

## 2022-02-06 VITALS — BP 116/60 | HR 91 | Temp 98.5°F | Resp 16 | Ht 67.0 in | Wt 224.7 lb

## 2022-02-06 DIAGNOSIS — E1142 Type 2 diabetes mellitus with diabetic polyneuropathy: Secondary | ICD-10-CM | POA: Diagnosis not present

## 2022-02-06 DIAGNOSIS — G8929 Other chronic pain: Secondary | ICD-10-CM

## 2022-02-06 DIAGNOSIS — I10 Essential (primary) hypertension: Secondary | ICD-10-CM

## 2022-02-06 DIAGNOSIS — M05742 Rheumatoid arthritis with rheumatoid factor of left hand without organ or systems involvement: Secondary | ICD-10-CM

## 2022-02-06 DIAGNOSIS — E1165 Type 2 diabetes mellitus with hyperglycemia: Secondary | ICD-10-CM

## 2022-02-06 DIAGNOSIS — K589 Irritable bowel syndrome without diarrhea: Secondary | ICD-10-CM

## 2022-02-06 DIAGNOSIS — Z23 Encounter for immunization: Secondary | ICD-10-CM | POA: Diagnosis not present

## 2022-02-06 DIAGNOSIS — M05741 Rheumatoid arthritis with rheumatoid factor of right hand without organ or systems involvement: Secondary | ICD-10-CM

## 2022-02-06 DIAGNOSIS — E785 Hyperlipidemia, unspecified: Secondary | ICD-10-CM | POA: Diagnosis not present

## 2022-02-06 DIAGNOSIS — M5441 Lumbago with sciatica, right side: Secondary | ICD-10-CM

## 2022-02-06 DIAGNOSIS — M5442 Lumbago with sciatica, left side: Secondary | ICD-10-CM

## 2022-02-06 MED ORDER — ATORVASTATIN CALCIUM 10 MG PO TABS: ORAL_TABLET | ORAL | 1 refills | Status: DC

## 2022-02-06 NOTE — Assessment & Plan Note (Signed)
Following with pain management

## 2022-02-06 NOTE — Patient Instructions (Addendum)
It was great seeing you today! ? ?Plan discussed at today's visit: ?-Blood work ordered today, results will be uploaded to [place].  ?-Cholesterol medication refilled  ?-Referral placed to podiatry today ?-Prevnar 20 vaccine today for pneumonia  ? ?Follow up in: 6 months  ? ?Take care and let us know if you have any questions or concerns prior to your next visit. ? ?Dr. Rosana Berger ? ?

## 2022-02-06 NOTE — Assessment & Plan Note (Signed)
Stable on Linzess. ?

## 2022-02-06 NOTE — Assessment & Plan Note (Signed)
Stable, following with Rheumatology, no changes made. ?

## 2022-02-06 NOTE — Assessment & Plan Note (Signed)
Stable, not on medication, continue to monitor.  ?

## 2022-02-06 NOTE — Assessment & Plan Note (Signed)
Stable, recheck labs today. Foot exam today, takes Gabapentin. Referral to Podiatry placed for foot/nail care. ?

## 2022-02-06 NOTE — Assessment & Plan Note (Signed)
Stable, Lipitor refilled today.  ?

## 2022-02-06 NOTE — Progress Notes (Signed)
Established Patient Office Visit  Subjective:  Patient ID: Elizabeth [name], female    DOB: 1965-12-17  Age: 56 y.o. MRN: [id number]  CC:  Chief Complaint  Patient presents with   Follow-up   Diabetes   Hyperlipidemia    HPI Elizabeth [name] presents for follow up on chronic medical conditions.   Diabetes, Type 2: -Last A1c 11/22 7.3% -Medications: Jardiance 10, Metformin 1000 BID -Patient is compliant with the above medications and reports no side effects.  -Checking BG at home: 117-140 fasting  -Eye exam: scheduled in March -Foot exam: Due -Microalbumin: 11/22 -Statin: yes -PNA vaccine: Due -Denies symptoms of hypoglycemia, polyuria, polydipsia, numbness extremities, foot ulcers/trauma.   Hypertension: -Medications: None  -Denies any SOB, CP, vision changes, LE edema or symptoms of hypotension  HLD: -Medications: Lipitor 10 -Patient is compliant with above medications and reports no side effects.  -Last lipid panel: Lipid Panel     Component Value Date/Time   CHOL 135 10/29/2021 0855   CHOL 181 03/21/2016 0806   TRIG 185 (H) 10/29/2021 0855   HDL 43 (L) 10/29/2021 0855   HDL 47 03/21/2016 0806   CHOLHDL 3.1 10/29/2021 0855   VLDL 29 01/01/2017 1124   LDLCALC 66 10/29/2021 0855   LABVLDL 22 03/21/2016 0806   RA: -Follows with Rheumatology, saw in January  -Currently on Methotrexate   GERD/IBS: -Currently on Linzess   Chronic Back Pain: -Following with pain management   Health Maintenance: -Blood work due -Mammogram scheduled  -Colonoscopy 6/21: repeat in 7 years   Past Medical History:  Diagnosis Date   Bipolar 1 disorder ([place])    Depression    Diabetes mellitus without complication ([place])    Diverticulosis    GERD (gastroesophageal reflux disease)    Heart murmur    followed by PCP   Hepatitis    B - "not active"   Hypertension goal BP (blood pressure) < 130/80 01/17/2016   Insomnia    Irritable bowel syndrome    Motion sickness    boats    Obesity (BMI 30-39.9) 07/03/2015   RA (rheumatoid arthritis) ([place])    Sciatica     Past Surgical History:  Procedure Laterality Date   BOWEL RESECTION     BREAST BIOPSY Right 12/22/2019   affirm bx x marker, papilloma    BREAST BIOPSY     CHOLECYSTECTOMY  1997   [place], [place]   COLONOSCOPY WITH PROPOFOL N/A 09/07/2015   Procedure: COLONOSCOPY WITH PROPOFOL;  Surgeon: Lucilla Lame, MD;  Location: [place];  Service: Endoscopy;  Laterality: N/A;   COLONOSCOPY WITH PROPOFOL N/A 05/16/2020   Procedure: COLONOSCOPY WITH PROPOFOL;  Surgeon: Lucilla Lame, MD;  Location[facility] ENDOSCOPY;  Service: Endoscopy;  Laterality: N/A;   ESOPHAGOGASTRODUODENOSCOPY (EGD) WITH ESOPHAGEAL DILATION     ESOPHAGOGASTRODUODENOSCOPY (EGD) WITH PROPOFOL N/A 09/07/2015   Procedure: ESOPHAGOGASTRODUODENOSCOPY (EGD) WITH PROPOFOL;  Surgeon: Lucilla Lame, MD;  Location: [place];  Service: Endoscopy;  Laterality: N/A;   PARTIAL COLECTOMY N/A 01/24/2016   Procedure: SIGMOID COLECTOMY;  Surgeon: Florene Glen, MD;  Location: [facility];  Service: General;  Laterality: N/A;   POLYPECTOMY N/A 09/07/2015   Procedure: POLYPECTOMY;  Surgeon: Lucilla Lame, MD;  Location: [place];  Service: Endoscopy;  Laterality: N/A;   TUBAL LIGATION      Family History  Problem Relation Age of Onset   Hypertension Mother    Diabetes Mother    Arthritis/Rheumatoid Mother    Hyperlipidemia Mother  Alcohol abuse Father    Esophageal varices Father    Diverticulosis Maternal Aunt    Diverticulosis Maternal Grandmother    Diabetes Maternal Grandmother    Heart disease Maternal Grandmother    Dementia Maternal Grandmother    Diabetes Brother    Kidney disease Brother    Aneurysm Maternal Grandfather    Diabetes Paternal Grandmother    Healthy Brother    Depression Daughter        bipolar disorder   Depression Daughter        bipolar disorder   Breast cancer Neg Hx     Social History    Socioeconomic History   Marital status: Married    Spouse name: Mortimer Fries   Number of children: 3   Years of education: some college   Highest education level: 12th grade  Occupational History   Occupation: Disabled  Tobacco Use   Smoking status: Former    Packs/day: 1.00    Years: 1.00    Pack years: 1.00    Types: Cigarettes    Quit date: 05/06/2015    Years since quitting: 6.7   Smokeless tobacco: Never   Tobacco comments:    smoking cessation materials not required  Vaping Use   Vaping Use: Never used  Substance and Sexual Activity   Alcohol use: Yes    Alcohol/week: 0.0 standard drinks    Comment: rarely small glass of wine   Drug use: No   Sexual activity: Yes    Birth control/protection: None  Other Topics Concern   Not on file  Social History Narrative   Not on file   Social Determinants of Health   Financial Resource Strain: Low Risk    Difficulty of Paying Living Expenses: Not hard at all  Food Insecurity: No Food Insecurity   Worried About Charity fundraiser in the Last Year: Never true   [place] in the Last Year: Never true  Transportation Needs: No Transportation Needs   Lack of Transportation (Medical): No   Lack of Transportation (Non-Medical): No  Physical Activity: Sufficiently Active   Days of Exercise per Week: 7 days   Minutes of Exercise per Session: 30 min  Stress: No Stress Concern Present   Feeling of Stress : Not at all  Social Connections: Moderately Integrated   Frequency of Communication with Friends and Family: More than three times a week   Frequency of Social Gatherings with Friends and Family: Three times a week   Attends Religious Services: More than 4 times per year   Active Member of Clubs or Organizations: No   Attends Archivist Meetings: Never   Marital Status: Married  Human resources officer Violence: Not At Risk   Fear of Current or Ex-Partner: No   Emotionally Abused: No   Physically Abused: No   Sexually  Abused: No    Outpatient Medications Prior to Visit  Medication Sig Dispense Refill   Accu-Chek FastClix Lancets MISC USE TO CHECK BLOOD SUGAR TWICE DAILY 102 each 0   aspirin 81 MG tablet Take 81 mg by mouth daily.     atorvastatin (LIPITOR) 10 MG tablet TAKE 1 TABLET(10 MG) BY MOUTH AT BEDTIME 90 tablet 1   azelastine (ASTELIN) 0.1 % nasal spray Place 2 sprays into both nostrils 2 (two) times daily. Use in each nostril as directed 30 mL 12   B-D INS SYR ULTRAFINE 1CC/31G 31G X 5/16" 1 ML MISC   1   Blood Glucose Monitoring  Suppl (ACCU-CHEK GUIDE) w/Device KIT AS DIRECTED EVERY DAY 1 kit 0   cholecalciferol (VITAMIN D) 1000 units tablet Take 1,000 Units by mouth daily.     clobetasol ointment (TEMOVATE) 0.05 % APPLY EXTERNALLY TO THE AFFECTED AREA TWICE DAILY     fluticasone (FLONASE) 50 MCG/ACT nasal spray Place into both nostrils daily.     folic acid (FOLVITE) 1 MG tablet Take 1 mg by mouth daily.     gabapentin (NEURONTIN) 600 MG tablet Take 1 tablet (600 mg total) by mouth 3 (three) times daily. 90 tablet 5   glucose blood (ACCU-CHEK GUIDE) test strip AS DIRECTED 50 strip 5   JARDIANCE 10 MG TABS tablet TAKE 1 TABLET(10 MG) BY MOUTH DAILY 90 tablet 0   linaclotide (LINZESS) 290 MCG CAPS capsule TAKE 1 CAPSULE(290 MCG) BY MOUTH DAILY 90 capsule 1   metFORMIN (GLUCOPHAGE-XR) 500 MG 24 hr tablet Take 2 tablets (1,000 mg total) by mouth 2 (two) times daily. 360 tablet 3   Methotrexate Sodium (METHOTREXATE, PF,) 50 MG/2ML injection 15 mg once a week.     Multiple Vitamins-Minerals (MULTIVITAMIN/EXTRA VITAMIN D3 PO) Take by mouth.     OLANZapine (ZYPREXA) 15 MG tablet Take 15 mg by mouth at bedtime.     Omega-3 Fatty Acids (FISH OIL PO) Take by mouth.     OVER THE COUNTER MEDICATION once daily Herbal Name: Goli     tiZANidine (ZANAFLEX) 4 MG tablet Take 1 tablet (4 mg total) by mouth at bedtime as needed for muscle spasms. 30 tablet 5   traZODone (DESYREL) 100 MG tablet Take 200 mg by mouth  at bedtime as needed.     Vitamin A 2400 MCG (8000 UT) TABS Take by mouth.     vitamin B-12 (CYANOCOBALAMIN) 1000 MCG tablet Take 1,000 mcg by mouth daily.     XIIDRA 5 % SOLN Place 1 drop into both eyes 2 (two) times daily.     Zinc 22.5 MG TABS Take by mouth.     No facility-administered medications prior to visit.    Allergies  Allergen Reactions   Ace Inhibitors Swelling    Angioedema (specifically from enalapril)   Enalapril Swelling    angioedema   Penicillins Hives and Swelling    Has patient had a PCN reaction causing immediate rash, facial/tongue/throat swelling, SOB or lightheadedness with hypotension: Yes Has patient had a PCN reaction causing severe rash involving mucus membranes or skin necrosis: No Has patient had a PCN reaction that required hospitalization Yes Has patient had a PCN reaction occurring within the last 10 years: No If all of the above answers are "NO", then may proceed with Cephalosporin use.   Statins Hives    ROS Review of Systems  Constitutional: Negative.   Respiratory: Negative.    Gastrointestinal: Negative.      Objective:    Physical Exam Constitutional:      Appearance: Normal appearance. She is obese.  HENT:     Head: Normocephalic and atraumatic.  Eyes:     Conjunctiva/sclera: Conjunctivae normal.  Cardiovascular:     Rate and Rhythm: Normal rate and regular rhythm.     Pulses:          Dorsalis pedis pulses are 2+ on the right side and 2+ on the left side.  Pulmonary:     Effort: Pulmonary effort is normal.     Breath sounds: Normal breath sounds.  Musculoskeletal:     Right lower leg: No edema.  Left lower leg: No edema.     Right foot: Normal range of motion. No deformity, bunion, Charcot foot, foot drop or prominent metatarsal heads.     Left foot: Normal range of motion. No deformity, bunion, Charcot foot, foot drop or prominent metatarsal heads.  Feet:     Right foot:     Protective Sensation: 6 sites tested.  6  sites sensed.     Skin integrity: Dry skin present.     Toenail Condition: Right toenails are abnormally thick.     Left foot:     Protective Sensation: 6 sites tested.  6 sites sensed.     Skin integrity: Dry skin present.     Toenail Condition: Left toenails are abnormally thick.  Skin:    General: Skin is warm and dry.  Neurological:     General: No focal deficit present.     Mental Status: She is alert. Mental status is at baseline.  Psychiatric:        Mood and Affect: Mood normal.        Behavior: Behavior normal.    BP 116/60    Pulse 91    Temp 98.5 F (36.9 C)    Resp 16    Ht '5\' 7"'  (1.702 m)    Wt 224 lb 11.2 oz (101.9 kg)    LMP 07/09/2014    SpO2 97%    BMI 35.19 kg/m  Wt Readings from Last 3 Encounters:  02/06/22 224 lb 11.2 oz (101.9 kg)  10/29/21 217 lb 8 oz (98.7 kg)  07/02/21 229 lb 11.2 oz (104.2 kg)    Health Maintenance Due  Topic Date Due   PAP SMEAR-Modifier  05/26/2021   FOOT EXAM  01/31/2022    There are no preventive care reminders to display for this patient.  Lab Results  Component Value Date   TSH 1.11 09/07/2020   Lab Results  Component Value Date   WBC 4.8 09/07/2020   HGB 12.3 09/07/2020   HCT 37.2 09/07/2020   MCV 93.5 09/07/2020   PLT 268 09/07/2020   Lab Results  Component Value Date   NA 142 10/29/2021   K 4.4 10/29/2021   CO2 28 10/29/2021   GLUCOSE 147 (H) 10/29/2021   BUN 10 10/29/2021   CREATININE 0.76 10/29/2021   BILITOT 0.4 07/02/2021   ALKPHOS 102 04/01/2017   AST 15 07/02/2021   ALT 23 07/02/2021   PROT 7.1 07/02/2021   ALBUMIN 4.1 04/01/2017   CALCIUM 9.4 10/29/2021   ANIONGAP 6 12/05/2017   Lab Results  Component Value Date   CHOL 135 10/29/2021   Lab Results  Component Value Date   HDL 43 (L) 10/29/2021   Lab Results  Component Value Date   LDLCALC 66 10/29/2021   Lab Results  Component Value Date   TRIG 185 (H) 10/29/2021   Lab Results  Component Value Date   CHOLHDL 3.1 10/29/2021   Lab  Results  Component Value Date   HGBA1C 7.3 (A) 10/29/2021      Assessment & Plan:   Problem List Items Addressed This Visit       Cardiovascular and Mediastinum   Hypertension goal BP (blood pressure) < 130/80 (Chronic)    Stable, not on medication, continue to monitor.       Relevant Medications   atorvastatin (LIPITOR) 10 MG tablet     Digestive   IBS (irritable bowel syndrome)    Stable on Linzess.  Endocrine   Type 2 diabetes mellitus with hyperglycemia, without long-term current use of insulin (HCC) - Primary    Stable, recheck labs today. Foot exam today, takes Gabapentin. Referral to Podiatry placed for foot/nail care.      Relevant Medications   atorvastatin (LIPITOR) 10 MG tablet   Other Relevant Orders   COMPLETE METABOLIC PANEL WITH GFR   HgB A1c     Nervous and Auditory   Chronic bilateral low back pain with bilateral sciatica    Following with pain management.        Musculoskeletal and Integument   Rheumatoid arthritis involving both hands (HCC) (Chronic)    Stable, following with Rheumatology, no changes made.        Other   Hyperlipidemia LDL goal <70    Stable, Lipitor refilled today.       Relevant Medications   atorvastatin (LIPITOR) 10 MG tablet   Other Visit Diagnoses     Vaccine for streptococcus pneumoniae and influenza       Relevant Orders   Pneumococcal conjugate vaccine 20-valent (Prevnar 20) (Completed)   Diabetic polyneuropathy associated with type 2 diabetes mellitus ([place])       Relevant Medications   atorvastatin (LIPITOR) 10 MG tablet   Other Relevant Orders   Ambulatory referral to Podiatry       Meds ordered this encounter  Medications   atorvastatin (LIPITOR) 10 MG tablet    Sig: TAKE 1 TABLET(10 MG) BY MOUTH AT BEDTIME    Dispense:  90 tablet    Refill:  1    Follow-up: Return in about 6 months (around 08/09/2022).    Teodora Medici, DO

## 2022-02-07 LAB — COMPLETE METABOLIC PANEL WITH GFR
AG Ratio: 1.9 (calc) (ref 1.0–2.5)
ALT: 30 U/L — ABNORMAL HIGH (ref 6–29)
AST: 21 U/L (ref 10–35)
Albumin: 4.1 g/dL (ref 3.6–5.1)
Alkaline phosphatase (APISO): 73 U/L (ref 37–153)
BUN: 11 mg/dL (ref 7–25)
CO2: 26 mmol/L (ref 20–32)
Calcium: 9.4 mg/dL (ref 8.6–10.4)
Chloride: 109 mmol/L (ref 98–110)
Creat: 0.93 mg/dL (ref 0.50–1.03)
Globulin: 2.2 g/dL (calc) (ref 1.9–3.7)
Glucose, Bld: 146 mg/dL — ABNORMAL HIGH (ref 65–99)
Potassium: 4.6 mmol/L (ref 3.5–5.3)
Sodium: 143 mmol/L (ref 135–146)
Total Bilirubin: 0.6 mg/dL (ref 0.2–1.2)
Total Protein: 6.3 g/dL (ref 6.1–8.1)
eGFR: 73 mL/min/{1.73_m2} (ref >=60)

## 2022-02-07 LAB — HEMOGLOBIN A1C
Hgb A1c MFr Bld: 7.7 % of total Hgb — ABNORMAL HIGH (ref <5.7)
Mean Plasma Glucose: 174 mg/dL
eAG (mmol/L): 9.7 mmol/L

## 2022-02-07 MED ORDER — EMPAGLIFLOZIN 25 MG PO TABS: 25.0000 mg | ORAL_TABLET | Freq: Every day | ORAL | 3 refills | Status: DC

## 2022-02-07 NOTE — Addendum Note (Signed)
Addended by: Teodora Medici on: 02/07/2022 09:29 AM ? ? Modules accepted: Orders ? ?

## 2022-02-18 ENCOUNTER — Ambulatory Visit
Admission: RE | Admit: 2022-02-18 | Discharge: 2022-02-18 | Disposition: A | Discharge status: Home / Self Care | Payer: Medicare Other | Source: Ambulatory Visit | Attending: Family Medicine | Admitting: Family Medicine

## 2022-02-18 ENCOUNTER — Other Ambulatory Visit: Payer: Self-pay

## 2022-02-18 ENCOUNTER — Other Ambulatory Visit: Payer: Self-pay | Admitting: Family Medicine

## 2022-02-18 DIAGNOSIS — Z1231 Encounter for screening mammogram for malignant neoplasm of breast: Secondary | ICD-10-CM | POA: Diagnosis present

## 2022-02-19 ENCOUNTER — Other Ambulatory Visit: Payer: Self-pay | Admitting: Family Medicine

## 2022-02-19 NOTE — Telephone Encounter (Signed)
Duplicate request ?Requested Prescriptions  ?Pending Prescriptions Disp Refills  ?? Accu-Chek FastClix Lancets MISC [Pharmacy Med Name: ACCU-CHEK FASTCLIX LANCETS 102'S] 102 each 1  ?  Sig: USE TO CHECK BLOOD SUGAR TWICE DAILY  ?  ? Endocrinology: Diabetes - Testing Supplies Passed - 02/19/2022 12:19 PM  ?  ?  Passed - Valid encounter within last 12 months  ?  Recent Outpatient Visits   ?      ? 1 week ago Type 2 diabetes mellitus with hyperglycemia, without long-term current use of insulin ([place])  ? [facility] Teodora Medici, DO  ? 3 months ago Type 2 diabetes mellitus with hyperglycemia, without long-term current use of insulin ([place])  ? [place], DO  ? 7 months ago Hypertension goal BP (blood pressure) < 130/80  ? [place], DO  ? 1 year ago Hypertension goal BP (blood pressure) < 130/80  ? [place], DO  ? 1 year ago Type 2 diabetes mellitus with hyperglycemia, without long-term current use of insulin ([place])  ? [place], DO  ?  ?  ?Future Appointments   ?        ? In 1 month  [place]  ? In 2 months Bacigalupo, Dionne Bucy, MD [facility]  ?  ? ?  ?  ?  ? ?

## 2022-02-19 NOTE — Telephone Encounter (Signed)
Requested Prescriptions  ?Pending Prescriptions Disp Refills  ?? Accu-Chek FastClix Lancets MISC [Pharmacy Med Name: ACCU-CHEK FASTCLIX LANCETS 102'S] 102 each 0  ?  Sig: USE TO CHECK BLOOD SUGAR TWICE DAILY  ?  ? Endocrinology: Diabetes - Testing Supplies Passed - 02/18/2022 11:40 AM  ?  ?  Passed - Valid encounter within last 12 months  ?  Recent Outpatient Visits   ?      ? 1 week ago Type 2 diabetes mellitus with hyperglycemia, without long-term current use of insulin ([place])  ? [facility] Teodora Medici, DO  ? 3 months ago Type 2 diabetes mellitus with hyperglycemia, without long-term current use of insulin ([place])  ? [place], DO  ? 7 months ago Hypertension goal BP (blood pressure) < 130/80  ? [place], DO  ? 1 year ago Hypertension goal BP (blood pressure) < 130/80  ? [place], DO  ? 1 year ago Type 2 diabetes mellitus with hyperglycemia, without long-term current use of insulin ([place])  ? [place], DO  ?  ?  ?Future Appointments   ?        ? In 1 month  [place]  ? In 2 months Bacigalupo, Dionne Bucy, MD [facility]  ?  ? ?  ?  ?  ? ? ?

## 2022-02-25 ENCOUNTER — Encounter: Payer: Self-pay | Admitting: Internal Medicine

## 2022-02-25 DIAGNOSIS — E611 Iron deficiency: Secondary | ICD-10-CM

## 2022-02-26 MED ORDER — FERROUS SULFATE 324 (65 FE) MG PO TBEC: 1.0000 | DELAYED_RELEASE_TABLET | Freq: Every day | ORAL | 3 refills | Status: DC

## 2022-02-28 ENCOUNTER — Other Ambulatory Visit: Payer: Self-pay

## 2022-02-28 ENCOUNTER — Ambulatory Visit (INDEPENDENT_AMBULATORY_CARE_PROVIDER_SITE_OTHER): Payer: Medicare Other | Admitting: Podiatry

## 2022-02-28 DIAGNOSIS — E119 Type 2 diabetes mellitus without complications: Secondary | ICD-10-CM

## 2022-02-28 DIAGNOSIS — L853 Xerosis cutis: Secondary | ICD-10-CM

## 2022-02-28 MED ORDER — AMMONIUM LACTATE 12 % EX LOTN: 1.0000 "application " | TOPICAL_LOTION | CUTANEOUS | 0 refills | Status: AC | PRN

## 2022-02-28 NOTE — Progress Notes (Signed)
Subjective: ?Elizabeth [name] presents today referred by Teodora Medici, DO for diabetic foot evaluation. ? ?Patient relates 8 year history of diabetes. ? ?Patient denies any history of foot wounds. ? ?Patient denies any history of numbness, tingling, burning, pins/needles sensations. ? ?Past Medical History:  ?Diagnosis Date  ? Bipolar 1 disorder ([place])   ? Depression   ? Diabetes mellitus without complication ([place])   ? Diverticulosis   ? GERD (gastroesophageal reflux disease)   ? Heart murmur   ? followed by PCP  ? Hepatitis   ? B - "not active"  ? Hypertension goal BP (blood pressure) < 130/80 01/17/2016  ? Insomnia   ? Irritable bowel syndrome   ? Motion sickness   ? boats  ? Obesity (BMI 30-39.9) 07/03/2015  ? RA (rheumatoid arthritis) ([place])   ? Sciatica   ? ? ?Patient Active Problem List  ? Diagnosis Date Noted  ? Elevated ALT measurement 07/02/2021  ? Spinal stenosis, lumbar region, with neurogenic claudication 05/21/2021  ? Chronic pain syndrome 05/21/2021  ? Chronic radicular lumbar pain 03/15/2021  ? Sacroiliac joint pain 03/15/2021  ? Bilateral hip pain 03/15/2021  ? Personal history of colonic polyps   ? Polyp of sigmoid colon   ? Chronic rhinitis 04/18/2020  ? Chronic bilateral low back pain with bilateral sciatica 01/20/2020  ? Pustular psoriasis of palms and soles 01/07/2020  ? Dyshidrotic eczema 01/07/2020  ? Intraductal papilloma of breast, right 12/28/2019  ? Psoriasis 08/06/2019  ? Immunosuppressed status ([place]) 07/01/2019  ? Morbid obesity ([place]) 07/01/2019  ? Vaginal atrophy 01/01/2017  ? Hyperlipidemia LDL goal <70 10/04/2016  ? Diverticula of colon 01/24/2016  ? Hypertension goal BP (blood pressure) < 130/80 01/17/2016  ? Bilateral carpal tunnel syndrome 01/03/2016  ? Bursitis, trochanteric 11/22/2015  ? Inversion, nipple 11/08/2015  ? Type 2 diabetes mellitus with hyperglycemia, without long-term current use of insulin ([place]) 10/23/2015  ? Rheumatoid arthritis involving both hands ([place]) 10/23/2015   ? Benign neoplasm of descending colon   ? Benign neoplasm of sigmoid colon   ? Bipolar disorder, current episode depressed, moderate ([place]) 07/03/2015  ? IBS (irritable bowel syndrome) 07/03/2015  ? Insomnia, uncontrolled 07/03/2015  ? GERD without esophagitis 07/03/2015  ? Bilateral hip bursitis 07/03/2015  ? ? ?Past Surgical History:  ?Procedure Laterality Date  ? BOWEL RESECTION    ? BREAST BIOPSY Right 12/22/2019  ? affirm bx x marker, papilloma   ? BREAST BIOPSY    ? CHOLECYSTECTOMY  1997  ? [place], [place]  ? COLONOSCOPY WITH PROPOFOL N/A 09/07/2015  ? Procedure: COLONOSCOPY WITH PROPOFOL;  Surgeon: Lucilla Lame, MD;  Location: [place];  Service: Endoscopy;  Laterality: N/A;  ? COLONOSCOPY WITH PROPOFOL N/A 05/16/2020  ? Procedure: COLONOSCOPY WITH PROPOFOL;  Surgeon: Lucilla Lame, MD;  [facility];  Service: Endoscopy;  Laterality: N/A;  ? ESOPHAGOGASTRODUODENOSCOPY (EGD) WITH ESOPHAGEAL DILATION    ? ESOPHAGOGASTRODUODENOSCOPY (EGD) WITH PROPOFOL N/A 09/07/2015  ? Procedure: ESOPHAGOGASTRODUODENOSCOPY (EGD) WITH PROPOFOL;  Surgeon: Lucilla Lame, MD;  Location: [place];  Service: Endoscopy;  Laterality: N/A;  ? PARTIAL COLECTOMY N/A 01/24/2016  ? Procedure: SIGMOID COLECTOMY;  Surgeon: Florene Glen, MD;  Location: [facility];  Service: General;  Laterality: N/A;  ? POLYPECTOMY N/A 09/07/2015  ? Procedure: POLYPECTOMY;  Surgeon: Lucilla Lame, MD;  Location: [place];  Service: Endoscopy;  Laterality: N/A;  ? TUBAL LIGATION    ? ? ?Current Outpatient Medications on File Prior to Visit  ?Medication  Sig Dispense Refill  ? empagliflozin (JARDIANCE) 25 MG TABS tablet Take 1 tablet (25 mg total) by mouth daily before breakfast. 30 tablet 3  ? Accu-Chek FastClix Lancets MISC USE TO CHECK BLOOD SUGAR TWICE DAILY 102 each 1  ? aspirin 81 MG tablet Take 81 mg by mouth daily.    ? atorvastatin (LIPITOR) 10 MG tablet TAKE 1 TABLET(10 MG) BY MOUTH AT BEDTIME 90 tablet 1  ? azelastine  (ASTELIN) 0.1 % nasal spray Place 2 sprays into both nostrils 2 (two) times daily. Use in each nostril as directed 30 mL 12  ? B-D INS SYR ULTRAFINE 1CC/31G 31G X 5/16" 1 ML MISC   1  ? Blood Glucose Monitoring Suppl (ACCU-CHEK GUIDE) w/Device KIT AS DIRECTED EVERY DAY 1 kit 0  ? cholecalciferol (VITAMIN D) 1000 units tablet Take 1,000 Units by mouth daily.    ? clobetasol ointment (TEMOVATE) 0.05 % APPLY EXTERNALLY TO THE AFFECTED AREA TWICE DAILY    ? ferrous sulfate 324 (65 Fe) MG TBEC Take 1 tablet (325 mg total) by mouth daily. 90 tablet 3  ? fluticasone (FLONASE) 50 MCG/ACT nasal spray Place into both nostrils daily.    ? folic acid (FOLVITE) 1 MG tablet Take 1 mg by mouth daily.    ? gabapentin (NEURONTIN) 600 MG tablet Take 1 tablet (600 mg total) by mouth 3 (three) times daily. 90 tablet 5  ? glucose blood (ACCU-CHEK GUIDE) test strip AS DIRECTED 50 strip 5  ? linaclotide (LINZESS) 290 MCG CAPS capsule TAKE 1 CAPSULE(290 MCG) BY MOUTH DAILY 90 capsule 1  ? metFORMIN (GLUCOPHAGE-XR) 500 MG 24 hr tablet Take 2 tablets (1,000 mg total) by mouth 2 (two) times daily. 360 tablet 3  ? Methotrexate Sodium (METHOTREXATE, PF,) 50 MG/2ML injection 15 mg once a week.    ? Multiple Vitamins-Minerals (MULTIVITAMIN/EXTRA VITAMIN D3 PO) Take by mouth.    ? OLANZapine (ZYPREXA) 15 MG tablet Take 15 mg by mouth at bedtime.    ? Omega-3 Fatty Acids (FISH OIL PO) Take by mouth.    ? OVER THE COUNTER MEDICATION once daily Herbal Name: Goli    ? tiZANidine (ZANAFLEX) 4 MG tablet Take 1 tablet (4 mg total) by mouth at bedtime as needed for muscle spasms. 30 tablet 5  ? traZODone (DESYREL) 100 MG tablet Take 200 mg by mouth at bedtime as needed.    ? Vitamin A 2400 MCG (8000 UT) TABS Take by mouth.    ? vitamin B-12 (CYANOCOBALAMIN) 1000 MCG tablet Take 1,000 mcg by mouth daily.    ? XIIDRA 5 % SOLN Place 1 drop into both eyes 2 (two) times daily.    ? Zinc 22.5 MG TABS Take by mouth.    ? ?No current facility-administered  medications on file prior to visit.  ?  ? ?Allergies  ?Allergen Reactions  ? Ace Inhibitors Swelling  ?  Angioedema (specifically from enalapril)  ? Enalapril Swelling  ?  angioedema  ? Penicillins Hives and Swelling  ?  Has patient had a PCN reaction causing immediate rash, facial/tongue/throat swelling, SOB or lightheadedness with hypotension: Yes ?Has patient had a PCN reaction causing severe rash involving mucus membranes or skin necrosis: No ?Has patient had a PCN reaction that required hospitalization Yes ?Has patient had a PCN reaction occurring within the last 10 years: No ?If all of the above answers are "NO", then may proceed with Cephalosporin use.  ? Statins Hives  ? ? ?Social History  ? ?Occupational  History  ? Occupation: Disabled  ?Tobacco Use  ? Smoking status: Former  ?  Packs/day: 1.00  ?  Years: 1.00  ?  Pack years: 1.00  ?  Types: Cigarettes  ?  Quit date: 05/06/2015  ?  Years since quitting: 6.8  ? Smokeless tobacco: Never  ? Tobacco comments:  ?  smoking cessation materials not required  ?Vaping Use  ? Vaping Use: Never used  ?Substance and Sexual Activity  ? Alcohol use: Yes  ?  Alcohol/week: 0.0 standard drinks  ?  Comment: rarely small glass of wine  ? Drug use: No  ? Sexual activity: Yes  ?  Birth control/protection: None  ? ? ?Family History  ?Problem Relation Age of Onset  ? Hypertension Mother   ? Diabetes Mother   ? Arthritis/Rheumatoid Mother   ? Hyperlipidemia Mother   ? Alcohol abuse Father   ? Esophageal varices Father   ? Diverticulosis Maternal Aunt   ? Diverticulosis Maternal Grandmother   ? Diabetes Maternal Grandmother   ? Heart disease Maternal Grandmother   ? Dementia Maternal Grandmother   ? Diabetes Brother   ? Kidney disease Brother   ? Aneurysm Maternal Grandfather   ? Diabetes Paternal Grandmother   ? Healthy Brother   ? Depression Daughter   ?     bipolar disorder  ? Depression Daughter   ?     bipolar disorder  ? Breast cancer Neg Hx   ? ? ?Immunization History   ?Administered Date(s) Administered  ? Hepatitis A, Adult 11/12/2017, 05/12/2018  ? Influenza,inj,Quad PF,6+ Mos 08/06/2017, 07/14/2018, 08/10/2019, 07/25/2020, 08/23/2021  ? Influenza-Unspecified 08/06/2017  ? Mode
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
## 2022-03-06 ENCOUNTER — Telehealth (INDEPENDENT_AMBULATORY_CARE_PROVIDER_SITE_OTHER): Payer: Medicare Other | Admitting: Psychiatry

## 2022-03-06 ENCOUNTER — Other Ambulatory Visit: Payer: Self-pay

## 2022-03-06 ENCOUNTER — Encounter: Payer: Self-pay | Admitting: Psychiatry

## 2022-03-06 DIAGNOSIS — F4310 Post-traumatic stress disorder, unspecified: Secondary | ICD-10-CM

## 2022-03-06 DIAGNOSIS — F251 Schizoaffective disorder, depressive type: Secondary | ICD-10-CM

## 2022-03-06 MED ORDER — SERTRALINE HCL 50 MG PO TABS
ORAL_TABLET | ORAL | 1 refills | Status: DC
Start: 2022-03-06 — End: 2022-06-10

## 2022-03-06 NOTE — Patient Instructions (Addendum)
Start sertraline 25 mg at night for one week, then 50 mg at night  ?Continue olanzapine 15 mg at night  ?Next appointment: 5/16 at 8:30, in person ? ?The next visit will be in person visit. Please arrive 15 mins before the scheduled time.  ? ?[place]  ?Address: [address], [place], [place] [address]   ?

## 2022-03-13 ENCOUNTER — Other Ambulatory Visit: Payer: Self-pay | Admitting: Student in an Organized Health Care Education/Training Program

## 2022-03-13 DIAGNOSIS — G894 Chronic pain syndrome: Secondary | ICD-10-CM

## 2022-03-13 DIAGNOSIS — G8929 Other chronic pain: Secondary | ICD-10-CM

## 2022-03-13 DIAGNOSIS — M48062 Spinal stenosis, lumbar region with neurogenic claudication: Secondary | ICD-10-CM

## 2022-03-15 ENCOUNTER — Other Ambulatory Visit: Payer: Self-pay

## 2022-03-15 ENCOUNTER — Other Ambulatory Visit: Payer: Self-pay | Admitting: Family Medicine

## 2022-03-15 DIAGNOSIS — G8929 Other chronic pain: Secondary | ICD-10-CM

## 2022-03-15 DIAGNOSIS — M48062 Spinal stenosis, lumbar region with neurogenic claudication: Secondary | ICD-10-CM

## 2022-03-15 DIAGNOSIS — G894 Chronic pain syndrome: Secondary | ICD-10-CM

## 2022-03-25 ENCOUNTER — Telehealth: Payer: Self-pay

## 2022-03-25 ENCOUNTER — Ambulatory Visit (INDEPENDENT_AMBULATORY_CARE_PROVIDER_SITE_OTHER): Payer: Medicare Other | Admitting: Certified Nurse Midwife

## 2022-03-25 ENCOUNTER — Encounter: Payer: Self-pay | Admitting: Certified Nurse Midwife

## 2022-03-25 VITALS — BP 131/83 | HR 91 | Ht 67.0 in | Wt 233.3 lb

## 2022-03-25 DIAGNOSIS — Z01419 Encounter for gynecological examination (general) (routine) without abnormal findings: Secondary | ICD-10-CM | POA: Diagnosis not present

## 2022-03-25 MED ORDER — OLANZAPINE 15 MG PO TABS: 15.0000 mg | ORAL_TABLET | Freq: Every day | ORAL | 0 refills | Status: DC

## 2022-03-25 NOTE — Telephone Encounter (Signed)
received fax requesting a refill on the olanzapine '15mg'$  ?

## 2022-03-25 NOTE — Progress Notes (Signed)
? ? ?GYNECOLOGY ANNUAL PREVENTATIVE CARE ENCOUNTER NOTE ? ?History:    ? Elizabeth [name] is a 56 y.o. No obstetric history on file. female here for a routine annual gynecologic exam.  Current complaints: vaginal dryness .   Denies abnormal vaginal bleeding, discharge, pelvic pain, problems with intercourse or other gynecologic concerns.  ?  ? ?Social ?Relationship: Married ?Living: husband ?Work: volunteers ?Exercise: yard work  ?Smoke/Alcohol/drug use: occasional alcohol use  ? ?Gynecologic History ?Patient's last menstrual period was 07/09/2014. ?Contraception: post menopausal status and tubal ligation ?Last Pap: 05/28/2018. Results were: normal with negative HPV ?Last mammogram: 02/18/2022. Results were: normal ?Colonoscopy : per pt done 2022 WNL ? ?Obstetric History ?OB History  ?No obstetric history on file.  ? ? ?Past Medical History:  ?Diagnosis Date  ? Bipolar 1 disorder ([place])   ? Depression   ? Diabetes mellitus without complication ([place])   ? Diverticulosis   ? GERD (gastroesophageal reflux disease)   ? Heart murmur   ? followed by PCP  ? Hepatitis   ? B - "not active"  ? Hypertension goal BP (blood pressure) < 130/80 01/17/2016  ? Insomnia   ? Irritable bowel syndrome   ? Motion sickness   ? boats  ? Obesity (BMI 30-39.9) 07/03/2015  ? RA (rheumatoid arthritis) ([place])   ? Sciatica   ? ? ?Past Surgical History:  ?Procedure Laterality Date  ? BOWEL RESECTION    ? BREAST BIOPSY Right 12/22/2019  ? affirm bx x marker, papilloma   ? BREAST BIOPSY    ? CHOLECYSTECTOMY  1997  ? [place]  ? COLONOSCOPY WITH PROPOFOL N/A 09/07/2015  ? Procedure: COLONOSCOPY WITH PROPOFOL;  Surgeon: Lucilla Lame, MD;  Location: [place];  Service: Endoscopy;  Laterality: N/A;  ? COLONOSCOPY WITH PROPOFOL N/A 05/16/2020  ? Procedure: COLONOSCOPY WITH PROPOFOL;  Surgeon: Lucilla Lame, MD;  Location[facility] ENDOSCOPY;  Service: Endoscopy;  Laterality: N/A;  ? ESOPHAGOGASTRODUODENOSCOPY (EGD) WITH ESOPHAGEAL DILATION    ?  ESOPHAGOGASTRODUODENOSCOPY (EGD) WITH PROPOFOL N/A 09/07/2015  ? Procedure: ESOPHAGOGASTRODUODENOSCOPY (EGD) WITH PROPOFOL;  Surgeon: Lucilla Lame, MD;  Location: [place];  Service: Endoscopy;  Laterality: N/A;  ? PARTIAL COLECTOMY N/A 01/24/2016  ? Procedure: SIGMOID COLECTOMY;  Surgeon: Florene Glen, MD;  Location: [facility];  Service: General;  Laterality: N/A;  ? POLYPECTOMY N/A 09/07/2015  ? Procedure: POLYPECTOMY;  Surgeon: Lucilla Lame, MD;  Location: [place];  Service: Endoscopy;  Laterality: N/A;  ? TUBAL LIGATION    ? ? ?Current Outpatient Medications on File Prior to Visit  ?Medication Sig Dispense Refill  ? Accu-Chek FastClix Lancets MISC USE TO CHECK BLOOD SUGAR TWICE DAILY 102 each 1  ? ammonium lactate (AMLACTIN) 12 % lotion Apply 1 application. topically as needed for dry skin. 400 g 0  ? aspirin 81 MG tablet Take 81 mg by mouth daily.    ? atorvastatin (LIPITOR) 10 MG tablet TAKE 1 TABLET(10 MG) BY MOUTH AT BEDTIME 90 tablet 1  ? azelastine (ASTELIN) 0.1 % nasal spray Place 2 sprays into both nostrils 2 (two) times daily. Use in each nostril as directed 30 mL 12  ? B-D INS SYR ULTRAFINE 1CC/31G 31G X 5/16" 1 ML MISC   1  ? Blood Glucose Monitoring Suppl (ACCU-CHEK GUIDE) w/Device KIT AS DIRECTED EVERY DAY 1 kit 0  ? cholecalciferol (VITAMIN D) 1000 units tablet Take 1,000 Units by mouth daily.    ? clobetasol ointment (TEMOVATE) 0.05 % APPLY EXTERNALLY  TO THE AFFECTED AREA TWICE DAILY    ? empagliflozin (JARDIANCE) 25 MG TABS tablet Take 1 tablet (25 mg total) by mouth daily before breakfast. 30 tablet 3  ? ferrous sulfate 324 (65 Fe) MG TBEC Take 1 tablet (325 mg total) by mouth daily. 90 tablet 3  ? fluticasone (FLONASE) 50 MCG/ACT nasal spray Place into both nostrils daily.    ? folic acid (FOLVITE) 1 MG tablet Take 1 mg by mouth daily.    ? gabapentin (NEURONTIN) 600 MG tablet Take 1 tablet (600 mg total) by mouth 3 (three) times daily. 90 tablet 5  ? glucose blood  (ACCU-CHEK GUIDE) test strip AS DIRECTED 50 strip 5  ? linaclotide (LINZESS) 290 MCG CAPS capsule TAKE 1 CAPSULE(290 MCG) BY MOUTH DAILY 90 capsule 1  ? metFORMIN (GLUCOPHAGE-XR) 500 MG 24 hr tablet Take 2 tablets (1,000 mg total) by mouth 2 (two) times daily. 360 tablet 3  ? Methotrexate Sodium (METHOTREXATE, PF,) 50 MG/2ML injection 15 mg once a week.    ? Multiple Vitamins-Minerals (MULTIVITAMIN/EXTRA VITAMIN D3 PO) Take by mouth.    ? Omega-3 Fatty Acids (FISH OIL PO) Take by mouth.    ? OVER THE COUNTER MEDICATION once daily Herbal Name: Goli    ? sertraline (ZOLOFT) 50 MG tablet 25 mg at night for one week, then 50 mg at night 30 tablet 1  ? tiZANidine (ZANAFLEX) 4 MG tablet Take 1 tablet (4 mg total) by mouth at bedtime as needed for muscle spasms. 30 tablet 5  ? traZODone (DESYREL) 100 MG tablet Take 200 mg by mouth at bedtime as needed.    ? Vitamin A 2400 MCG (8000 UT) TABS Take by mouth.    ? vitamin B-12 (CYANOCOBALAMIN) 1000 MCG tablet Take 1,000 mcg by mouth daily.    ? XIIDRA 5 % SOLN Place 1 drop into both eyes 2 (two) times daily.    ? Zinc 22.5 MG TABS Take by mouth.    ? ?No current facility-administered medications on file prior to visit.  ? ? ?Allergies  ?Allergen Reactions  ? Ace Inhibitors Swelling  ?  Angioedema (specifically from enalapril)  ? Enalapril Swelling  ?  angioedema  ? Penicillins Hives and Swelling  ?  Has patient had a PCN reaction causing immediate rash, facial/tongue/throat swelling, SOB or lightheadedness with hypotension: Yes ?Has patient had a PCN reaction causing severe rash involving mucus membranes or skin necrosis: No ?Has patient had a PCN reaction that required hospitalization Yes ?Has patient had a PCN reaction occurring within the last 10 years: No ?If all of the above answers are "NO", then may proceed with Cephalosporin use.  ? Statins Hives  ? ? ?Social History:  reports that she quit smoking about 6 years ago. Her smoking use included cigarettes. She has a 1.00  pack-year smoking history. She has never used smokeless tobacco. She reports current alcohol use. She reports that she does not use drugs. ? ?Family History  ?Problem Relation Age of Onset  ? Hypertension Mother   ? Diabetes Mother   ? Arthritis/Rheumatoid Mother   ? Hyperlipidemia Mother   ? Alcohol abuse Father   ? Esophageal varices Father   ? Diverticulosis Maternal Aunt   ? Diverticulosis Maternal Grandmother   ? Diabetes Maternal Grandmother   ? Heart disease Maternal Grandmother   ? Dementia Maternal Grandmother   ? Diabetes Brother   ? Kidney disease Brother   ? Aneurysm Maternal Grandfather   ? Diabetes Paternal  Grandmother   ? Healthy Brother   ? Depression Daughter   ?     bipolar disorder  ? Depression Daughter   ?     bipolar disorder  ? Breast cancer Neg Hx   ? ? ?The following portions of the patient's history were reviewed and updated as appropriate: allergies, current medications, past family history, past medical history, past social history, past surgical history and problem list. ? ?Review of Systems ?Pertinent items noted in HPI and remainder of comprehensive ROS otherwise negative. ? ?Physical Exam:  ?BP 131/83   Pulse 91   Ht '5\' 7"'  (1.702 m)   Wt 233 lb 4.8 oz (105.8 kg)   LMP 07/09/2014   BMI 36.54 kg/m?  ?CONSTITUTIONAL: Well-developed, well-nourished , obese female in no acute distress.  ?HENT:  Normocephalic, atraumatic, External right and left ear normal. Oropharynx is clear and moist ?EYES: Conjunctivae and EOM are normal. Pupils are equal, round, and reactive to light. No scleral icterus.  ?NECK: Normal range of motion, supple, no masses.  Normal thyroid.  ?SKIN: Skin is warm and dry. No rash noted. Not diaphoretic. No erythema. No pallor. ?MUSCULOSKELETAL: Normal range of motion. No tenderness.  No cyanosis, clubbing, or edema.  2+ distal pulses. ?NEUROLOGIC: Alert and oriented to person, place, and time. Normal reflexes, muscle tone coordination.  ?PSYCHIATRIC: Normal mood and  affect. Normal behavior. Normal judgment and thought content. ?CARDIOVASCULAR: Normal heart rate noted, regular rhythm ?RESPIRATORY: Clear to auscultation bilaterally. Effort and breath sounds normal, no problems with respira

## 2022-03-28 ENCOUNTER — Other Ambulatory Visit: Payer: Self-pay | Admitting: Internal Medicine

## 2022-03-28 ENCOUNTER — Other Ambulatory Visit: Payer: Self-pay

## 2022-03-28 DIAGNOSIS — E1165 Type 2 diabetes mellitus with hyperglycemia: Secondary | ICD-10-CM

## 2022-03-28 MED ORDER — METFORMIN HCL ER 500 MG PO TB24: 1000.0000 mg | ORAL_TABLET | Freq: Two times a day (BID) | ORAL | 0 refills | Status: DC

## 2022-03-29 NOTE — Telephone Encounter (Signed)
Requested medication (s) are due for refill today: no ? ?Requested medication (s) are on the active medication list: yes ? ?Last refill:  03/28/22 ? ?Future visit scheduled: yes ? ?Notes to clinic:  Unable to refill per protocol due to pharmacy requesting 90 day supply. ? ? ?  ?Requested Prescriptions  ?Pending Prescriptions Disp Refills  ? metFORMIN (GLUCOPHAGE-XR) 500 MG 24 hr tablet [Pharmacy Med Name: METFORMIN ER 500MG 24HR TABS] 360 tablet   ?  Sig: TAKE 2 TABLETS(1000 MG) BY MOUTH TWICE DAILY  ?  ? Endocrinology:  Diabetes - Biguanides Failed - 03/28/2022 12:54 PM  ?  ?  Failed - B12 Level in normal range and within 720 days  ?  No results found for: VITAMINB12  ?  ?  ?  Failed - CBC within normal limits and completed in the last 12 months  ?  WBC  ?Date Value Ref Range Status  ?09/07/2020 4.8 3.8 - 10.8 Thousand/uL Final  ? ?RBC  ?Date Value Ref Range Status  ?09/07/2020 3.98 3.80 - 5.10 Million/uL Final  ? ?Hemoglobin  ?Date Value Ref Range Status  ?09/07/2020 12.3 11.7 - 15.5 g/dL Final  ? ?HCT  ?Date Value Ref Range Status  ?09/07/2020 37.2 35.0 - 45.0 % Final  ? ?MCHC  ?Date Value Ref Range Status  ?09/07/2020 33.1 32.0 - 36.0 g/dL Final  ? ?MCH  ?Date Value Ref Range Status  ?09/07/2020 30.9 27.0 - 33.0 pg Final  ? ?MCV  ?Date Value Ref Range Status  ?09/07/2020 93.5 80.0 - 100.0 fL Final  ? ?No results found for: PLTCOUNTKUC, LABPLAT, [place] ?RDW  ?Date Value Ref Range Status  ?09/07/2020 13.4 11.0 - 15.0 % Final  ? ?  ?  ?  Passed - Cr in normal range and within 360 days  ?  Creat  ?Date Value Ref Range Status  ?02/06/2022 0.93 0.50 - 1.03 mg/dL Final  ? ?Creatinine, Urine  ?Date Value Ref Range Status  ?10/29/2021 98 20 - 275 mg/dL Final  ?  ?  ?  ?  Passed - HBA1C is between 0 and 7.9 and within 180 days  ?  Hgb A1c MFr Bld  ?Date Value Ref Range Status  ?02/06/2022 7.7 (H) <5.7 % of total Hgb Final  ?  Comment:  ?  For someone without known diabetes, a hemoglobin A1c ?value of 6.5% or greater  indicates that they may have  ?diabetes and this should be confirmed with a follow-up  ?test. ?. ?For someone with known diabetes, a value <7% indicates  ?that their diabetes is well controlled and a value  ?greater than or equal to 7% indicates suboptimal  ?control. A1c targets should be individualized based on  ?duration of diabetes, age, comorbid conditions, and  ?other considerations. ?. ?Currently, no consensus exists regarding use of ?hemoglobin A1c for diagnosis of diabetes for children. ?. ?  ?  ?  ?  ?  Passed - eGFR in normal range and within 360 days  ?  GFR, Est African American  ?Date Value Ref Range Status  ?09/07/2020 96 > OR = 60 mL/min/1.8m Final  ? ?GFR, Est Non African American  ?Date Value Ref Range Status  ?09/07/2020 83 > OR = 60 mL/min/1.751mFinal  ? ?eGFR  ?Date Value Ref Range Status  ?02/06/2022 73 > OR = 60 mL/min/1.7374minal  ?  Comment:  ?  The eGFR is based on the CKD-EPI 2021 equation. To calculate  ?the new eGFR from a  previous Creatinine or Cystatin C ?result, go to https://www.kidney.org/professionals/ ?kdoqi/gfr%5Fcalculator ?  ?  ?  ?  ?  Passed - Valid encounter within last 6 months  ?  Recent Outpatient Visits   ? ?      ? 1 month ago Type 2 diabetes mellitus with hyperglycemia, without long-term current use of insulin ([place])  ? [facility] Teodora Medici, DO  ? 5 months ago Type 2 diabetes mellitus with hyperglycemia, without long-term current use of insulin ([place])  ? [place], DO  ? 9 months ago Hypertension goal BP (blood pressure) < 130/80  ? [place], DO  ? 1 year ago Hypertension goal BP (blood pressure) < 130/80  ? [place], DO  ? 1 year ago Type 2 diabetes mellitus with hyperglycemia, without long-term current use of insulin ([place])  ? [place], DO  ? ?  ?  ?Future Appointments   ? ?         ? In 2 weeks  [place]  ? In 3 weeks Bacigalupo, Dionne Bucy, MD [facility]  ? In 1 year Philip Aspen, CNM [facility]  ? ?  ? ? ?  ?  ?  ? ? ?

## 2022-04-16 ENCOUNTER — Ambulatory Visit: Payer: Medicare Other

## 2022-04-16 ENCOUNTER — Ambulatory Visit (INDEPENDENT_AMBULATORY_CARE_PROVIDER_SITE_OTHER): Payer: Medicare Other

## 2022-04-16 DIAGNOSIS — Z0000 Encounter for general adult medical examination without abnormal findings: Secondary | ICD-10-CM

## 2022-04-16 NOTE — Patient Instructions (Signed)
Ms. Butchko , ?Thank you for taking time to come for your Medicare Wellness Visit. I appreciate your ongoing commitment to your health goals. Please review the following plan we discussed and let me know if I can assist you in the future.  ? ?Screening recommendations/referrals: ?Colonoscopy: done 05/16/20. Repeat 05/2027 ?Mammogram: done 02/18/22 ?Bone Density: due age 56 ?Recommended yearly ophthalmology/optometry visit for glaucoma screening and checkup ?Recommended yearly dental visit for hygiene and checkup ? ?Vaccinations: ?Influenza vaccine: done 08/23/21 ?Pneumococcal vaccine: done 02/06/22 ?Tdap vaccine: done 11/04/17 ?Shingles vaccine: done 01/08/18 & 04/01/18  ?Covid-19: done 02/10/20, 03/04/20, 07/25/20, 03/07/21 & 08/23/21 ? ?Advanced directives: Please bring a copy of your health care power of attorney and living will to the office at your convenience.  ? ?Conditions/risks identified:  ? ?Please use the following resources for diabetes management: ? ?Www.diabetes.org  ? ?Www.cornerstones4care.com ? ?Next appointment: Follow up in one year for your annual wellness visit.  ? ?Preventive Care 40-64 Years, Female ?Preventive care refers to lifestyle choices and visits with your health care provider that can promote health and wellness. ?What does preventive care include? ?A yearly physical exam. This is also called an annual well check. ?Dental exams once or twice a year. ?Routine eye exams. Ask your health care provider how often you should have your eyes checked. ?Personal lifestyle choices, including: ?Daily care of your teeth and gums. ?Regular physical activity. ?Eating a healthy diet. ?Avoiding tobacco and drug use. ?Limiting alcohol use. ?Practicing safe sex. ?Taking low-dose aspirin daily starting at age 31. ?Taking vitamin and mineral supplements as recommended by your health care provider. ?What happens during an annual well check? ?The services and screenings done by your health care provider during your  annual well check will depend on your age, overall health, lifestyle risk factors, and family history of disease. ?Counseling  ?Your health care provider may ask you questions about your: ?Alcohol use. ?Tobacco use. ?Drug use. ?Emotional well-being. ?Home and relationship well-being. ?Sexual activity. ?Eating habits. ?Work and work Statistician. ?Method of birth control. ?Menstrual cycle. ?Pregnancy history. ?Screening  ?You may have the following tests or measurements: ?Height, weight, and BMI. ?Blood pressure. ?Lipid and cholesterol levels. These may be checked every 5 years, or more frequently if you are over 64 years old. ?Skin check. ?Lung cancer screening. You may have this screening every year starting at age 40 if you have a 30-pack-year history of smoking and currently smoke or have quit within the past 15 years. ?Fecal occult blood test (FOBT) of the stool. You may have this test every year starting at age 47. ?Flexible sigmoidoscopy or colonoscopy. You may have a sigmoidoscopy every 5 years or a colonoscopy every 10 years starting at age 61. ?Hepatitis C blood test. ?Hepatitis B blood test. ?Sexually transmitted disease (STD) testing. ?Diabetes screening. This is done by checking your blood sugar (glucose) after you have not eaten for a while (fasting). You may have this done every 1-3 years. ?Mammogram. This may be done every 1-2 years. Talk to your health care provider about when you should start having regular mammograms. This may depend on whether you have a family history of breast cancer. ?BRCA-related cancer screening. This may be done if you have a family history of breast, ovarian, tubal, or peritoneal cancers. ?Pelvic exam and Pap test. This may be done every 3 years starting at age 16. Starting at age 25, this may be done every 5 years if you have a Pap test in combination with an  HPV test. ?Bone density scan. This is done to screen for osteoporosis. You may have this scan if you are at high  risk for osteoporosis. ?Discuss your test results, treatment options, and if necessary, the need for more tests with your health care provider. ?Vaccines  ?Your health care provider may recommend certain vaccines, such as: ?Influenza vaccine. This is recommended every year. ?Tetanus, diphtheria, and acellular pertussis (Tdap, Td) vaccine. You may need a Td booster every 10 years. ?Zoster vaccine. You may need this after age 14. ?Pneumococcal 13-valent conjugate (PCV13) vaccine. You may need this if you have certain conditions and were not previously vaccinated. ?Pneumococcal polysaccharide (PPSV23) vaccine. You may need one or two doses if you smoke cigarettes or if you have certain conditions. ?Talk to your health care provider about which screenings and vaccines you need and how often you need them. ?This information is not intended to replace advice given to you by your health care provider. Make sure you discuss any questions you have with your health care provider. ?Document Released: 12/22/2015 Document Revised: 08/14/2016 Document Reviewed: 09/26/2015 ?Elsevier Interactive Patient Education ? 2017 [employer] Inc. ? ? ? ?Fall Prevention in the Home ?Falls can cause injuries. They can happen to people of all ages. There are many things you can do to make your home safe and to help prevent falls. ?What can I do on the outside of my home? ?Regularly fix the edges of walkways and driveways and fix any cracks. ?Remove anything that might make you trip as you walk through a door, such as a raised step or threshold. ?Trim any bushes or trees on the path to your home. ?Use bright outdoor lighting. ?Clear any walking paths of anything that might make someone trip, such as rocks or tools. ?Regularly check to see if handrails are loose or broken. Make sure that both sides of any steps have handrails. ?Any raised decks and porches should have guardrails on the edges. ?Have any leaves, snow, or ice cleared regularly. ?Use  sand or salt on walking paths during winter. ?Clean up any spills in your garage right away. This includes oil or grease spills. ?What can I do in the bathroom? ?Use night lights. ?Install grab bars by the toilet and in the tub and shower. Do not use towel bars as grab bars. ?Use non-skid mats or decals in the tub or shower. ?If you need to sit down in the shower, use a plastic, non-slip stool. ?Keep the floor dry. Clean up any water that spills on the floor as soon as it happens. ?Remove soap buildup in the tub or shower regularly. ?Attach bath mats securely with double-sided non-slip rug tape. ?Do not have throw rugs and other things on the floor that can make you trip. ?What can I do in the bedroom? ?Use night lights. ?Make sure that you have a light by your bed that is easy to reach. ?Do not use any sheets or blankets that are too big for your bed. They should not hang down onto the floor. ?Have a firm chair that has side arms. You can use this for support while you get dressed. ?Do not have throw rugs and other things on the floor that can make you trip. ?What can I do in the kitchen? ?Clean up any spills right away. ?Avoid walking on wet floors. ?Keep items that you use a lot in easy-to-reach places. ?If you need to reach something above you, use a strong step stool that has a  grab bar. ?Keep electrical cords out of the way. ?Do not use floor polish or wax that makes floors slippery. If you must use wax, use non-skid floor wax. ?Do not have throw rugs and other things on the floor that can make you trip. ?What can I do with my stairs? ?Do not leave any items on the stairs. ?Make sure that there are handrails on both sides of the stairs and use them. Fix handrails that are broken or loose. Make sure that handrails are as long as the stairways. ?Check any carpeting to make sure that it is firmly attached to the stairs. Fix any carpet that is loose or worn. ?Avoid having throw rugs at the top or bottom of the  stairs. If you do have throw rugs, attach them to the floor with carpet tape. ?Make sure that you have a light switch at the top of the stairs and the bottom of the stairs. If you do not have them, ask someone to

## 2022-04-16 NOTE — Progress Notes (Signed)
? ?Subjective:  ? Elizabeth [name] is a 56 y.o. female who presents for Medicare Annual (Subsequent) preventive examination. ? ?Virtual Visit via Telephone Note ? ?I connected with  [name] on 04/16/22 at  9:00 AM EDT by telephone and verified that I am speaking with the correct person using two identifiers. ? ?Location: ?Patient: home ?Provider: [facility] ?Persons participating in the virtual visit: patient/Nurse Health Advisor ?  ?I discussed the limitations, risks, security and privacy concerns of performing an evaluation and management service by telephone and the availability of in person appointments. The patient expressed understanding and agreed to proceed. ? ?Interactive audio and video telecommunications were attempted between this nurse and patient, however failed, due to patient having technical difficulties OR patient did not have access to video capability.  We continued and completed visit with audio only. ? ?Some vital signs may be absent or patient reported.  ? ?Clemetine Marker, LPN ? ? ?Review of Systems    ? ?Cardiac Risk Factors include: advanced age (>33mn, >>56women);dyslipidemia;hypertension;diabetes mellitus;obesity (BMI >30kg/m2) ? ?   ?Objective:  ?  ?There were no vitals filed for this visit. ?There is no height or weight on file to calculate BMI. ? ? ?  04/16/2022  ?  9:00 AM 04/12/2021  ?  8:59 AM 04/10/2021  ?  2:32 PM 03/15/2021  ? 10:25 AM 05/16/2020  ?  7:48 AM 03/31/2020  ?  9:44 AM 04/01/2019  ?  9:06 AM  ?Advanced Directives  ?Does Patient Have a Medical Advance Directive? _0  Yes Yes  ?Type of Advance Directive [place]  Living will [place] Living will;Healthcare Power of Attorney  ?Does patient want to make changes to medical advance directive?    No - Patient declined     ?Copy of [place]?  No - copy requested    No - copy  requested No - copy requested  ? ? ?Current Medications (verified) ?Outpatient Encounter Medications as of 04/16/2022  ?Medication Sig  ? Accu-Chek FastClix Lancets MISC USE TO CHECK BLOOD SUGAR TWICE DAILY  ? ammonium lactate (AMLACTIN) 12 % lotion Apply 1 application. topically as needed for dry skin.  ? aspirin 81 MG tablet Take 81 mg by mouth daily.  ? atorvastatin (LIPITOR) 10 MG tablet TAKE 1 TABLET(10 MG) BY MOUTH AT BEDTIME  ? azelastine (ASTELIN) 0.1 % nasal spray Place 2 sprays into both nostrils 2 (two) times daily. Use in each nostril as directed  ? B-D INS SYR ULTRAFINE 1CC/31G 31G X 5/16" 1 ML MISC   ? Blood Glucose Monitoring Suppl (ACCU-CHEK GUIDE) w/Device KIT AS DIRECTED EVERY DAY  ? cholecalciferol (VITAMIN D) 1000 units tablet Take 1,000 Units by mouth daily.  ? clobetasol ointment (TEMOVATE) 0.05 % APPLY EXTERNALLY TO THE AFFECTED AREA TWICE DAILY  ? empagliflozin (JARDIANCE) 25 MG TABS tablet Take 1 tablet (25 mg total) by mouth daily before breakfast.  ? ferrous sulfate 324 (65 Fe) MG TBEC Take 1 tablet (325 mg total) by mouth daily.  ? fluticasone (FLONASE) 50 MCG/ACT nasal spray Place into both nostrils daily.  ? folic acid (FOLVITE) 1 MG tablet Take 1 mg by mouth daily.  ? gabapentin (NEURONTIN) 600 MG tablet Take 1 tablet (600 mg total) by mouth 3 (three) times daily.  ? glucose blood (ACCU-CHEK GUIDE) test strip AS DIRECTED  ? linaclotide (LINZESS) 290 MCG  CAPS capsule TAKE 1 CAPSULE(290 MCG) BY MOUTH DAILY  ? metFORMIN (GLUCOPHAGE-XR) 500 MG 24 hr tablet Take 2 tablets (1,000 mg total) by mouth 2 (two) times daily.  ? Methotrexate Sodium (METHOTREXATE, PF,) 50 MG/2ML injection 15 mg once a week.  ? Multiple Vitamins-Minerals (MULTIVITAMIN/EXTRA VITAMIN D3 PO) Take by mouth.  ? OLANZapine (ZYPREXA) 15 MG tablet Take 1 tablet (15 mg total) by mouth at bedtime.  ? Omega-3 Fatty Acids (FISH OIL PO) Take by mouth.  ? OVER THE COUNTER MEDICATION once daily Herbal Name: Goli  ? sertraline  (ZOLOFT) 50 MG tablet 25 mg at night for one week, then 50 mg at night  ? tiZANidine (ZANAFLEX) 4 MG tablet Take 1 tablet (4 mg total) by mouth at bedtime as needed for muscle spasms.  ? traZODone (DESYREL) 100 MG tablet Take 200 mg by mouth at bedtime as needed.  ? Vitamin A 2400 MCG (8000 UT) TABS Take by mouth.  ? vitamin B-12 (CYANOCOBALAMIN) 1000 MCG tablet Take 1,000 mcg by mouth daily.  ? XIIDRA 5 % SOLN Place 1 drop into both eyes 2 (two) times daily.  ? Zinc 22.5 MG TABS Take by mouth.  ? ?No facility-administered encounter medications on file as of 04/16/2022.  ? ? ?Allergies (verified) ?Ace inhibitors, Enalapril, Penicillins, and Statins  ? ?History: ?Past Medical History:  ?Diagnosis Date  ? Bipolar 1 disorder ([place])   ? Depression   ? Diabetes mellitus without complication ([place])   ? Diverticulosis   ? GERD (gastroesophageal reflux disease)   ? Heart murmur   ? followed by PCP  ? Hepatitis   ? B - "not active"  ? Hypertension goal BP (blood pressure) < 130/80 01/17/2016  ? Insomnia   ? Irritable bowel syndrome   ? Motion sickness   ? boats  ? Obesity (BMI 30-39.9) 07/03/2015  ? RA (rheumatoid arthritis) ([place])   ? Sciatica   ? ?Past Surgical History:  ?Procedure Laterality Date  ? BOWEL RESECTION    ? BREAST BIOPSY Right 12/22/2019  ? affirm bx x marker, papilloma   ? BREAST BIOPSY    ? CHOLECYSTECTOMY  1997  ? [place], [place]  ? COLONOSCOPY WITH PROPOFOL N/A 09/07/2015  ? Procedure: COLONOSCOPY WITH PROPOFOL;  Surgeon: Lucilla Lame, MD;  Location: [facility];  Service: Endoscopy;  Laterality: N/A;  ? COLONOSCOPY WITH PROPOFOL N/A 05/16/2020  ? Procedure: COLONOSCOPY WITH PROPOFOL;  Surgeon: Lucilla Lame, MD;  Location: [facility] ENDOSCOPY;  Service: Endoscopy;  Laterality: N/A;  ? ESOPHAGOGASTRODUODENOSCOPY (EGD) WITH ESOPHAGEAL DILATION    ? ESOPHAGOGASTRODUODENOSCOPY (EGD) WITH PROPOFOL N/A 09/07/2015  ? Procedure: ESOPHAGOGASTRODUODENOSCOPY (EGD) WITH PROPOFOL;  Surgeon: Lucilla Lame, MD;  [facility];  Service: Endoscopy;  Laterality: N/A;  ? PARTIAL COLECTOMY N/A 01/24/2016  ? Procedure: SIGMOID COLECTOMY;  Surgeon: Florene Glen, MD;  Location: [facility];  Service: General;  Laterality: N/A;  ? POLYPECTOMY N/A 09/07/2015  ? Procedure: POLYPECTOMY;  Surgeon: Lucilla Lame, MD;  Location: [facility];  Service: Endoscopy;  Laterality: N/A;  ? TUBAL LIGATION    ? ?Family History  ?Problem Relation Age of Onset  ? Hypertension Mother   ? Diabetes Mother   ? Arthritis/Rheumatoid Mother   ? Hyperlipidemia Mother   ? Alcohol abuse Father   ? Esophageal varices Father   ? Diverticulosis Maternal Aunt   ? Diverticulosis Maternal Grandmother   ? Diabetes Maternal Grandmother   ? Heart disease Maternal Grandmother   ? Dementia Maternal  Grandmother   ? Diabetes Brother   ? Kidney disease Brother   ? Aneurysm Maternal Grandfather   ? Diabetes Paternal Grandmother   ? Healthy Brother   ? Depression Daughter   ?     bipolar disorder  ? Depression Daughter   ?     bipolar disorder  ? Breast cancer Neg Hx   ? ?Social History  ? ?Socioeconomic History  ? Marital status: Married  ?  Spouse name: Mortimer Fries  ? Number of children: 3  ? Years of education: some college  ? Highest education level: 12th grade  ?Occupational History  ? Occupation: Disabled  ?Tobacco Use  ? Smoking status: Former  ?  Packs/day: 1.00  ?  Years: 1.00  ?  Pack years: 1.00  ?  Types: Cigarettes  ?  Quit date: 05/06/2015  ?  Years since quitting: 6.9  ? Smokeless tobacco: Never  ? Tobacco comments:  ?  smoking cessation materials not required  ?Vaping Use  ? Vaping Use: Never used  ?Substance and Sexual Activity  ? Alcohol use: Yes  ?  Alcohol/week: 0.0 standard drinks  ?  Comment: rarely small glass of wine  ? Drug use: No  ? Sexual activity: Yes  ?  Birth control/protection: None  ?Other Topics Concern  ? Not on file  ?Social History Narrative  ? Not on file  ? ?Social Determinants of Health  ? ?Financial Resource Strain: Low Risk   ?  Difficulty of Paying Living Expenses: Not hard at all  ?Food Insecurity: No Food Insecurity  ? Worried About Charity fundraiser in the Last Year: Never true  ? Ran Out of Food in the Last Year: Never true  ?[employer]portation [employer]

## 2022-04-23 ENCOUNTER — Ambulatory Visit: Payer: Medicare Other | Admitting: Psychiatry

## 2022-04-24 NOTE — Progress Notes (Deleted)
New patient visit   Patient: Elizabeth [name]   DOB: 03/27/1966   56 y.o. Female  MRN: [id number] Visit Date: 04/25/2022  Today's healthcare provider: Lavon Paganini, MD   No chief complaint on file.  Subjective    Elizabeth [name] is a 56 y.o. female who presents today as a new patient to establish care.  HPI  ***  Past Medical History:  Diagnosis Date   Bipolar 1 disorder ([place])    Depression    Diabetes mellitus without complication ([facility])    Diverticulosis    GERD (gastroesophageal reflux disease)    Heart murmur    followed by PCP   Hepatitis    B - "not active"   Hypertension goal BP (blood pressure) < 130/80 01/17/2016   Insomnia    Irritable bowel syndrome    Motion sickness    boats   Obesity (BMI 30-39.9) 07/03/2015   RA (rheumatoid arthritis) ([place])    Sciatica    Past Surgical History:  Procedure Laterality Date   BOWEL RESECTION     BREAST BIOPSY Right 12/22/2019   affirm bx x marker, papilloma    BREAST BIOPSY     CHOLECYSTECTOMY  1997   [place], [place]   COLONOSCOPY WITH PROPOFOL N/A 09/07/2015   Procedure: COLONOSCOPY WITH PROPOFOL;  Surgeon: Lucilla Lame, MD;  Location: [facility];  Service: Endoscopy;  Laterality: N/A;   COLONOSCOPY WITH PROPOFOL N/A 05/16/2020   Procedure: COLONOSCOPY WITH PROPOFOL;  Surgeon: Lucilla Lame, MD;  Location[facility] END[facility]COPY;  Service: Endoscopy;  Laterality: N/A;   ESOPHAGOGASTRODUODENOSCOPY (EGD) WITH ESOPHAGEAL DILATION     ESOPHAGOGASTRODUODENOSCOPY (EGD) WITH PROPOFOL N/A 09/07/2015   Procedure: ESOPHAGOGASTRODUODENOSCOPY (EGD) WITH PROPOFOL;  Surgeon: Lucilla Lame, MD;  Location: [place] [facility];  Service: Endoscopy;  Laterality: N/A;   PARTIAL COLECTOMY N/A 01/24/2016   Procedure: SIGMOID COLECTOMY;  Surgeon: Florene Glen, MD;  Location: [facility];  Service: General;  Laterality: N/A;   POLYPECTOMY N/A 09/07/2015   Procedure: POLYPECTOMY;  Surgeon: Lucilla Lame, MD;  Location: [facility];  Service:  Endoscopy;  Laterality: N/A;   TUBAL LIGATION     Family Status  Relation Name Status   Mother  Alive   Father  Deceased       blood loss/covid/heart attack   Mat Aunt  (Not Specified)   MGM  Deceased       dementia, chf, dm   Sister Denny Peon (unknown) Alive   Brother Licensed conveyancer Alive   MGF  Deceased       anuerysm   PGM  Deceased       DM coma   PGF  Deceased       unknown   Brother [employer]   Daughter Chrystal Alive   Son Jeneen Rinks Alive   Daughter Brittney Alive   Neg Hx  (Not Specified)   Family History  Problem Relation Age of Onset   Hypertension Mother    Diabetes Mother    Arthritis/Rheumatoid Mother    Hyperlipidemia Mother    Alcohol abuse Father    Esophageal varices Father    Diverticulosis Maternal Aunt    Diverticulosis Maternal Grandmother    Diabetes Maternal Grandmother    Heart disease Maternal Grandmother    Dementia Maternal Grandmother    Diabetes Brother    Kidney disease Brother    Aneurysm Maternal Grandfather    Diabetes Paternal 53    Healthy Brother    Depression Daughter  bipolar disorder   Depression Daughter        bipolar disorder   Breast cancer Neg Hx    Social History   Socioeconomic History   Marital status: Married    Spouse name: Mortimer Fries   Number of children: 3   Years of education: some college   Highest education level: 12th grade  Occupational History   Occupation: Disabled  Tobacco Use   Smoking status: Former    Packs/day: 1.00    Years: 1.00    Pack years: 1.00    Types: Cigarettes    Quit date: 05/06/2015    Years since quitting: 6.9   Smokeless tobacco: Never   Tobacco comments:    smoking cessation materials not required  Vaping Use   Vaping Use: Never used  Substance and Sexual Activity   Alcohol use: Yes    Alcohol/week: 0.0 standard drinks    Comment: rarely small glass of wine   Drug use: No   Sexual activity: Yes    Birth control/protection: None  Other Topics Concern   Not on file   Social History Narrative   Not on file   Social Determinants of Health   Financial Resource Strain: Low Risk    Difficulty of Paying Living Expenses: Not hard at all  Food Insecurity: No Food Insecurity   Worried About Charity fundraiser in the Last Year: Never true   [place] in the Last Year: Never true  Transportation Needs: No Transportation Needs   Lack of Transportation (Medical): No   Lack of Transportation (Non-Medical): No  Physical Activity: Inactive   Days of Exercise per Week: 0 days   Minutes of Exercise per Session: 0 min  Stress: No Stress Concern Present   Feeling of Stress : Not at all  Social Connections: Moderately Integrated   Frequency of Communication with Friends and Family: More than three times a week   Frequency of Social Gatherings with Friends and Family: Three times a week   Attends Religious Services: More than 4 times per year   Active Member of Clubs or Organizations: No   Attends Archivist Meetings: Never   Marital Status: Married   Outpatient Medications Prior to Visit  Medication Sig   Accu-Chek FastClix Lancets MISC USE TO CHECK BLOOD SUGAR TWICE DAILY   ammonium lactate (AMLACTIN) 12 % lotion Apply 1 application. topically as needed for dry skin.   aspirin 81 MG tablet Take 81 mg by mouth daily.   atorvastatin (LIPITOR) 10 MG tablet TAKE 1 TABLET(10 MG) BY MOUTH AT BEDTIME   azelastine (ASTELIN) 0.1 % nasal spray Place 2 sprays into both nostrils 2 (two) times daily. Use in each nostril as directed   B-D INS SYR ULTRAFINE 1CC/31G 31G X 5/16" 1 ML MISC    Blood Glucose Monitoring Suppl (ACCU-CHEK GUIDE) w/Device KIT AS DIRECTED EVERY DAY   cholecalciferol (VITAMIN D) 1000 units tablet Take 1,000 Units by mouth daily.   clobetasol ointment (TEMOVATE) 0.05 % APPLY EXTERNALLY TO THE AFFECTED AREA TWICE DAILY   empagliflozin (JARDIANCE) 25 MG TABS tablet Take 1 tablet (25 mg total) by mouth daily before breakfast.   ferrous  sulfate 324 (65 Fe) MG TBEC Take 1 tablet (325 mg total) by mouth daily.   fluticasone (FLONASE) 50 MCG/ACT nasal spray Place into both nostrils daily.   folic acid (FOLVITE) 1 MG tablet Take 1 mg by mouth daily.   gabapentin (NEURONTIN) 600 MG tablet Take 1  tablet (600 mg total) by mouth 3 (three) times daily.   glucose blood (ACCU-CHEK GUIDE) test strip AS DIRECTED   linaclotide (LINZESS) 290 MCG CAPS capsule TAKE 1 CAPSULE(290 MCG) BY MOUTH DAILY   metFORMIN (GLUCOPHAGE-XR) 500 MG 24 hr tablet Take 2 tablets (1,000 mg total) by mouth 2 (two) times daily.   Methotrexate Sodium (METHOTREXATE, PF,) 50 MG/2ML injection 15 mg once a week.   Multiple Vitamins-Minerals (MULTIVITAMIN/EXTRA VITAMIN D3 PO) Take by mouth.   OLANZapine (ZYPREXA) 15 MG tablet Take 1 tablet (15 mg total) by mouth at bedtime.   Omega-3 Fatty Acids (FISH OIL PO) Take by mouth.   OVER THE COUNTER MEDICATION once daily Herbal Name: Goli   sertraline (ZOLOFT) 50 MG tablet 25 mg at night for one week, then 50 mg at night   tiZANidine (ZANAFLEX) 4 MG tablet Take 1 tablet (4 mg total) by mouth at bedtime as needed for muscle spasms.   traZODone (DESYREL) 100 MG tablet Take 200 mg by mouth at bedtime as needed.   Vitamin A 2400 MCG (8000 UT) TABS Take by mouth.   vitamin B-12 (CYANOCOBALAMIN) 1000 MCG tablet Take 1,000 mcg by mouth daily.   XIIDRA 5 % SOLN Place 1 drop into both eyes 2 (two) times daily.   Zinc 22.5 MG TABS Take by mouth.   No facility-administered medications prior to visit.   Allergies  Allergen Reactions   Ace Inhibitors Swelling    Angioedema (specifically from enalapril)   Enalapril Swelling    angioedema   Penicillins Hives and Swelling    Has patient had a PCN reaction causing immediate rash, facial/tongue/throat swelling, SOB or lightheadedness with hypotension: Yes Has patient had a PCN reaction causing severe rash involving mucus membranes or skin necrosis: No Has patient had a PCN reaction that  required hospitalization Yes Has patient had a PCN reaction occurring within the last 10 years: No If all of the above answers are "NO", then may proceed with Cephalosporin use.   Statins Hives    Immunization History  Administered Date(s) Administered   Hepatitis A, Adult 11/12/2017, 05/12/2018   Influenza,inj,Quad PF,6+ Mos 08/06/2017, 07/14/2018, 08/10/2019, 07/25/2020, 08/23/2021   Influenza-Unspecified 08/06/2017   Moderna Covid-19 Vaccine Bivalent Booster 75yr & up 08/23/2021   Moderna Sars-Covid-2 Vaccination 02/10/2020, 03/04/2020, 07/25/2020, 03/07/2021   PNEUMOCOCCAL CONJUGATE-20 02/06/2022   Pneumococcal Polysaccharide-23 11/04/2017   Td 11/04/2017   Tdap 11/29/2015, 11/04/2017   Zoster Recombinat (Shingrix) 01/08/2018, 04/01/2018    Health Maintenance  Topic Date Due   OPHTHALMOLOGY EXAM  03/05/2022   PAP SMEAR-Modifier  05/28/2022 (Originally 05/26/2021)   INFLUENZA VACCINE  07/09/2022   HEMOGLOBIN A1C  08/09/2022   URINE MICROALBUMIN  10/29/2022   FOOT EXAM  02/07/2023   MAMMOGRAM  02/19/2023   COLONOSCOPY (Pts 45-464yrInsurance coverage will need to be confirmed)  05/17/2027   TETANUS/TDAP  11/05/2027   COVID-19 Vaccine  Completed   Hepatitis C Screening  Completed   HIV Screening  Completed   Zoster Vaccines- Shingrix  Completed   HPV VACCINES  Aged Out    Patient Care Team: AnTeodora MediciDO as PCP - General (Internal Medicine) LaGillis SantaMD as Consulting Physician (Pain Medicine) PaQuintin AltoMD as Consulting Physician (Rheumatology) BeClyde CanterburyMD as Referring Physician (Otolaryngology) WoLucilla LameMD as Consulting Physician (Gastroenterology) ThPhilip AspenCNM as Midwife (Certified Nurse Midwife) HiNorman ClayMD as Consulting Physician (Psychiatry)  Review of Systems  {Labs  Heme  Chem  Endocrine  Serology  Results Review (optional):23779}   Objective    LMP 07/09/2014  {Show previous vital signs  (optional):23777}  Physical Exam ***  Depression Screen    04/16/2022    8:59 AM 03/06/2022   10:27 AM 02/06/2022    9:30 AM 10/29/2021    8:26 AM  PHQ 2/9 Scores  PHQ - 2 Score 0  0 0  PHQ- 9 Score   0 0     Information is confidential and restricted. Go to Review Flowsheets to unlock data.   No results found for any visits on 04/25/22.  Assessment & Plan     ***  No follow-ups on file.     {provider attestation***:1}   Lavon Paganini, MD  [facility] [phone] (phone) [phone] (fax)  [place]

## 2022-04-25 ENCOUNTER — Ambulatory Visit: Payer: Medicare Other | Admitting: Family Medicine

## 2022-04-29 ENCOUNTER — Ambulatory Visit: Payer: Medicare Other | Admitting: Family Medicine

## 2022-04-30 ENCOUNTER — Encounter: Payer: Self-pay | Admitting: Student in an Organized Health Care Education/Training Program

## 2022-04-30 ENCOUNTER — Ambulatory Visit
Payer: Medicare Other | Attending: Student in an Organized Health Care Education/Training Program | Admitting: Student in an Organized Health Care Education/Training Program

## 2022-04-30 VITALS — BP 134/85 | HR 99 | Temp 97.4°F | Resp 18 | Ht 67.0 in | Wt 231.0 lb

## 2022-04-30 DIAGNOSIS — M5416 Radiculopathy, lumbar region: Secondary | ICD-10-CM | POA: Diagnosis present

## 2022-04-30 DIAGNOSIS — G8929 Other chronic pain: Secondary | ICD-10-CM | POA: Insufficient documentation

## 2022-04-30 DIAGNOSIS — M48062 Spinal stenosis, lumbar region with neurogenic claudication: Secondary | ICD-10-CM | POA: Diagnosis present

## 2022-04-30 DIAGNOSIS — G894 Chronic pain syndrome: Secondary | ICD-10-CM | POA: Insufficient documentation

## 2022-04-30 NOTE — Patient Instructions (Addendum)
Elizabeth [name] is a patient of mine at the [facility].  She suffers from a chronic spinal degenerative condition that results in low back and leg pain.  She often has to stand up to stretch out to help alleviate her pain.  For this reason I recommend that she be excused from jury duty.  Should you have any additional questions or concerns, do not hesitate take to reach out to our clinic.  Gillis Santa, MD [facility]                  Epidural Steroid Injection  An epidural steroid injection is a shot of steroid medicine and numbing medicine that is given into the space between the spinal cord and the bones of the back (epidural space). The shot helps relieve pain caused by an irritated or swollen nerve root. The amount of pain relief you get from the injection depends on what is causing the nerve to be swollen and irritated, and how long your pain lasts. You are more likely to benefit from this injection if your pain is strong and comes on suddenly rather than if you have had long-term (chronic) pain. Tell a health care provider about: Any allergies you have. All medicines you are taking, including vitamins, herbs, eye drops, creams, and over-the-counter medicines. Any problems you or family members have had with anesthetic medicines. Any blood disorders you have. Any surgeries you have had. Any medical conditions you have. Whether you are pregnant or may be pregnant. What are the risks? Generally, this is a safe procedure. However, problems may occur, including: Headache. Bleeding. Infection. Allergic reaction to medicines. Nerve damage. What happens before the procedure? Staying hydrated Follow instructions from your health care provider about hydration, which may include: Up to 2 hours before the procedure - you may continue to drink clear liquids, such as water, clear fruit juice, black coffee, and plain tea. Eating and drinking  restrictions Follow instructions from your health care provider about eating and drinking, which may include: 8 hours before the procedure - stop eating heavy meals or foods, such as meat, fried foods, or fatty foods. 6 hours before the procedure - stop eating light meals or foods, such as toast or cereal. 6 hours before the procedure - stop drinking milk or drinks that contain milk. 2 hours before the procedure - stop drinking clear liquids. Medicines You may be given medicines to lower anxiety. Ask your health care provider about: Changing or stopping your regular medicines. This is especially important if you are taking diabetes medicines or blood thinners. Taking medicines such as aspirin and ibuprofen. These medicines can thin your blood. Do not take these medicines unless your health care provider tells you to take them. Taking over-the-counter medicines, vitamins, herbs, and supplements. General instructions Ask your health care provider what steps will be taken to prevent infection. Plan to have a responsible adult take you home from the hospital or clinic. If you will be going home right after the procedure, plan to have a responsible adult care for you for the time you are told. This is important. What happens during the procedure? An IV will be inserted into one of your veins. You will be given one or more of the following: A medicine to help you relax (sedative). A medicine to numb the area (local anesthetic). You will be asked to lie on your abdomen or sit. The injection site will be cleaned. A needle will be inserted through  your skin into the epidural space. This may cause you some discomfort. An X-ray machine will be used to guide the needle as close as possible to the affected nerve. A steroid medicine and a local anesthetic will be injected into the epidural space. The needle and IV will be removed. A bandage (dressing) will be put over the injection site. The procedure  may vary among health care providers and hospitals. What can I expect after the procedure? Your blood pressure, heart rate, breathing rate, and blood oxygen level will be monitored until you leave the hospital or clinic. Your arm or leg may feel weak or numb for a few hours. The injection site may feel sore. Follow these instructions at home: Injection site care You may remove the bandage (dressing) after 24 hours. Check your injection site every day for signs of infection. Check for: Redness, swelling, or pain. Fluid or blood. Warmth. Pus or a bad smell. Managing pain, stiffness, and swelling For 24 hours after the procedure: Avoid using heat on the injection site. Do not take baths, swim, or use a hot tub until your health care provider approves. Ask your health care provider if you may take showers. You may only be allowed to take sponge baths. If directed, put ice on the injection site. To do this: Put ice in a plastic bag. Place a towel between your skin and the bag. Leave the ice on for 20 minutes, 2-3 times a day.  Activity If you were given a sedative during the procedure, it can affect you for several hours. Do not drive or operate machinery until your health care provider says that it is safe. Return to your normal activities as told by your health care provider. Ask your health care provider what activities are safe for you. General instructions Take over-the-counter and prescription medicines only as told by your health care provider. Drink enough fluid to keep your urine pale yellow. Keep all follow-up visits as told by your health care provider. This is important. Contact a health care provider if: You have any of these signs of infection: Redness, swelling, or pain around your injection site. Fluid or blood coming from your injection site. Warmth coming from your injection site. Pus or a bad smell coming from your injection site. A fever. You continue to have pain  and soreness around the injection site, even after taking over-the-counter pain medicine. You have severe, sudden, or lasting nausea or vomiting. Get help right away if: You have severe pain at the injection site that is not relieved by medicines. You develop a severe headache or a stiff neck. You become sensitive to light. You have any new numbness or weakness in your legs or arms. You lose control of your bladder or bowel movements. You have trouble breathing. Summary An epidural steroid injection is a shot of steroid medicine and numbing medicine that is given into the epidural space. The shot helps relieve pain caused by an irritated or swollen nerve root. You are more likely to benefit from this injection if your pain is strong and comes on suddenly rather than if you have had chronic pain. This information is not intended to replace advice given to you by your health care provider. Make sure you discuss any questions you have with your health care provider. Document Revised: 10/25/2021 Document Reviewed: 06/07/2019 Elsevier Patient Education  [place].

## 2022-04-30 NOTE — Progress Notes (Signed)
Safety precautions to be maintained throughout the outpatient stay will include: orient to surroundings, keep bed in low position, maintain call bell within reach at all times, provide assistance with transfer out of bed and ambulation.  

## 2022-04-30 NOTE — Progress Notes (Signed)
PROVIDER NOTE: Information contained herein reflects review and annotations entered in association with encounter. Interpretation of such information and data should be left to medically-trained personnel. Information provided to patient can be located elsewhere in the medical record under "Patient Instructions". Document created using STT-dictation technology, any transcriptional errors that may result from process are unintentional.    Patient: Elizabeth [name]  Service Category: E/M  Provider: Gillis Santa, MD  DOB: 23-Dec-1965  DOS: 04/30/2022  Specialty: Interventional Pain Management  MRN: [id number]  Setting: Ambulatory outpatient  PCP: Teodora Medici, DO  Type: Established Patient    Referring Provider: Teodora Medici, DO  Location: Office  Delivery: Face-to-face     HPI  Ms. Elizabeth [name], a 56 y.o. year old female, is here today because of her Chronic radicular lumbar pain [M54.16, G89.29]. Ms. Elizabeth [name] primary complain today is Back Pain (Left lower) Last encounter: My last encounter with her was on 03/13/2022. Pertinent problems: Ms. Elizabeth [name] has Chronic radicular lumbar pain; Spinal stenosis, lumbar region, with neurogenic claudication; and Chronic pain syndrome on their pertinent problem list. Pain Assessment: Severity of Chronic pain is reported as a 7 /10. Location: Back Lower, Left/radiates down left leg to back of knee. Onset: More than a month ago. Quality: Heaviness, Sharp, Dull. Timing: Constant. Modifying factor(s): nothing. Vitals:  height is _0  (1.702 m) and weight is 231 lb (104.8 kg). Her temperature is 97.4 F (36.3 C) (abnormal). Her blood pressure is 134/85 and her pulse is 99. Her respiration is 18 and oxygen saturation is 100%.   Reason for encounter: evaluation of worsening, or previously known (established) problem.   Elizabeth [name] presents today with increased lower back pain with radiation into her left hip, left buttock and left leg in a dermatomal fashion.  She has a  history of chronic lumbar radicular pain secondary to neuroforaminal stenosis and spinal stenosis.  She is status post left L4-L5 lumbar epidural steroid injection on 06/04/2021 which was approximately 11 months ago.  She states that this injection was very helpful in managing her sciatica related pain.  Given increased left dermatomal pain which is impacting her ability to function and complete ADLs, we discussed repeating left L4-L5 ESI.  Risks and benefits reviewed and patient like to proceed.  Of note, her rheumatologist is weaning her gabapentin as she was experiencing swelling in her lower extremities left greater than right.  She states that her swelling is getting a little bit better as she has been decreasing her gabapentin dose.  This is probably also why she is experiencing increased lumbar radicular pain.  ROS  Constitutional: Denies any fever or chills Gastrointestinal: No reported hemesis, hematochezia, vomiting, or acute GI distress Musculoskeletal:  Low back pain with radiation into left lower extremity Neurological: No reported episodes of acute onset apraxia, aphasia, dysarthria, agnosia, amnesia, paralysis, loss of coordination, or loss of consciousness  Medication Review  Accu-Chek FastClix Lancets, Accu-Chek Guide, Insulin Syringe-Needle U-100, Lifitegrast, Multiple Vitamins-Minerals, OLANZapine, OVER THE COUNTER MEDICATION, Omega-3 Fatty Acids, Vitamin A, Zinc, ammonium lactate, aspirin, atorvastatin, azelastine, cholecalciferol, clobetasol ointment, empagliflozin, ferrous sulfate, fluticasone, folic acid, gabapentin, glucose blood, linaclotide, metFORMIN, methotrexate (PF), sertraline, tiZANidine, traZODone, and vitamin B-12  History Review  Allergy: Ms. Elizabeth [name] is allergic to ace inhibitors, enalapril, penicillins, and statins. Drug: Ms. Elizabeth [name]  reports no history of drug use. Alcohol:  reports current alcohol use. Tobacco:  reports that she quit smoking about 6 years ago. Her  smoking use included cigarettes. She has a 1.00 pack-year  smoking history. She has never used smokeless tobacco. Social: Ms. Elizabeth [name]  reports that she quit smoking about 6 years ago. Her smoking use included cigarettes. She has a 1.00 pack-year smoking history. She has never used smokeless tobacco. She reports current alcohol use. She reports that she does not use drugs. Medical:  has a past medical history of Bipolar 1 disorder ([place]), Depression, Diabetes mellitus without complication ([place]), Diverticulosis, GERD (gastroesophageal reflux disease), Heart murmur, Hepatitis, Hypertension goal BP (blood pressure) < 130/80 (01/17/2016), Insomnia, Irritable bowel syndrome, Motion sickness, Obesity (BMI 30-39.9) (07/03/2015), RA (rheumatoid arthritis) ([place]), and Sciatica. Surgical: Ms. Elizabeth [name]  has a past surgical history that includes Tubal ligation; Esophagogastroduodenoscopy (egd) with esophageal dilation; Cholecystectomy (1997); Colonoscopy with propofol (N/A, 09/07/2015); Esophagogastroduodenoscopy (egd) with propofol (N/A, 09/07/2015); polypectomy (N/A, 09/07/2015); Partial colectomy (N/A, 01/24/2016); Bowel resection; Colonoscopy with propofol (N/A, 05/16/2020); Breast biopsy (Right, 12/22/2019); and Breast biopsy. Family: family history includes Alcohol abuse in her father; Aneurysm in her maternal grandfather; Arthritis/Rheumatoid in her mother; Dementia in her maternal grandmother; Depression in her daughter and daughter; Diabetes in her brother, maternal grandmother, mother, and paternal grandmother; Diverticulosis in her maternal aunt and maternal grandmother; Esophageal varices in her father; Healthy in her brother; Heart disease in her maternal grandmother; Hyperlipidemia in her mother; Hypertension in her mother; Kidney disease in her brother.  Laboratory Chemistry Profile   Renal Lab Results  Component Value Date   BUN 11 02/06/2022   CREATININE 0.93 02/06/2022   LABCREA 98 09/38/1829   BCR NOT  APPLICABLE 93/71/6967   GFRAA 96 09/07/2020   GFRNONAA 83 09/07/2020    Hepatic Lab Results  Component Value Date   AST 21 02/06/2022   ALT 30 (H) 02/06/2022   ALBUMIN 4.1 04/01/2017   ALKPHOS 102 04/01/2017   LIPASE 13 09/07/2020    Electrolytes Lab Results  Component Value Date   NA 143 02/06/2022   K 4.6 02/06/2022   CL 109 02/06/2022   CALCIUM 9.4 02/06/2022    Bone Lab Results  Component Value Date   VD25OH 34 07/02/2017    Inflammation (CRP: Acute Phase) (ESR: Chronic Phase) No results found for: CRP, ESRSEDRATE, LATICACIDVEN       Note: Above Lab results reviewed.  Recent Imaging Review  MM 3D SCREEN BREAST BILATERAL CLINICAL DATA:  Screening.  EXAM: DIGITAL SCREENING BILATERAL MAMMOGRAM WITH TOMOSYNTHESIS AND CAD  TECHNIQUE: Bilateral screening digital craniocaudal and mediolateral oblique mammograms were obtained. Bilateral screening digital breast tomosynthesis was performed. The images were evaluated with computer-aided detection.  COMPARISON:  Previous exam(s).  ACR Breast Density Category b: There are scattered areas of fibroglandular density.  FINDINGS: There are no findings suspicious for malignancy.  IMPRESSION: No mammographic evidence of malignancy. A result letter of this screening mammogram will be mailed directly to the patient.  RECOMMENDATION: Screening mammogram in one year. (Code:SM-B-01Y)  BI-RADS CATEGORY  1: Negative.  Electronically Signed   By: Abelardo Diesel M.D.   On: 02/18/2022 08:54   MR LUMBAR SPINE WO CONTRAST CLINICAL DATA:  Osteoarthritis, lumbosacral. Low back pain. Lumbar radiculopathy.   EXAM: MRI LUMBAR SPINE WITHOUT CONTRAST   TECHNIQUE: Multiplanar, multisequence MR imaging of the lumbar spine was performed. No intravenous contrast was administered.   COMPARISON:  Radiographs March 15, 2021   FINDINGS: Segmentation:  Standard.   Alignment:  Physiologic.   Vertebrae:  No fracture, evidence of  discitis, or bone lesion.   Conus medullaris and cauda equina: Conus extends to the L1 level. Conus and  cauda equina appear normal. Incidental note made of fibrofatty filum.   Paraspinal and other soft tissues: Negative.   Disc levels:   T12-L1: No spinal canal or neural foraminal stenosis.   L1-2: No spinal canal or neural foraminal stenosis.   L2-3: Shallow disc bulge, mild facet degenerative changes and ligamentum flavum redundancy without significant spinal canal stenosis. Mild bilateral neural foraminal narrowing.   L3-4: Disc bulge, moderate hypertrophic facet degenerative changes and ligamentum flavum redundancy resulting in mild spinal canal stenosis and mild bilateral neural foraminal narrowing.   L4-5: Disc bulge, moderate hypertrophic facet degenerative change ligamentum flavum redundancy resulting in mild spinal canal stenosis mild bilateral neural foraminal narrowing.   L5-S1: Mild facet degenerative changes. No spinal canal or neural foraminal stenosis.   IMPRESSION: 1. Mild degenerative changes of the lumbar spine, mostly at the level of the facet joints at L3-4 and L4-5 with mild spinal canal stenosis at this levels. 2. No high-grade spinal canal or neural foraminal stenosis at any level.   Electronically Signed   By: Pedro Earls M.D.   On: 04/26/2021 10:58 o   Note: Reviewed        Physical Exam  General appearance: Well nourished, well developed, and well hydrated. In no apparent acute distress Mental status: Alert, oriented x 3 (person, place, & time)       Respiratory: No evidence of acute respiratory distress Eyes: PERLA Vitals: BP 134/85   Pulse 99   Temp (!) 97.4 F (36.3 C)   Resp 18   Ht _0  (1.702 m)   Wt 231 lb (104.8 kg)   LMP 07/09/2014   SpO2 100%   BMI 36.18 kg/m  BMI: Estimated body mass index is 36.18 kg/m as calculated from the following:   Height as of this encounter: _1  (1.702 m).   Weight as of  this encounter: 231 lb (104.8 kg). Ideal: Ideal body weight: 61.6 kg (135 lb 12.9 oz) Adjusted ideal body weight: 78.9 kg (173 lb 14.1 oz)    Lumbar Exam  Skin & Axial Inspection: No masses, redness, or swelling Alignment: Symmetrical Functional ROM: Pain restricted ROM       Stability: No instability detected Muscle Tone/Strength: Functionally intact. No obvious neuro-muscular anomalies detected. Sensory (Neurological): Dermatomal pain pattern and musculoskeletal Positive straight leg raise test bilaterally Gait & Posture Assessment  Ambulation: Unassisted Gait: Relatively normal for age and body habitus Posture: WNL    Lower Extremity Exam      Side: Right lower extremity   Side: Left lower extremity  Stability: No instability observed           Stability: No instability observed          Skin & Extremity Inspection: Skin color, temperature, and hair growth are WNL. No peripheral edema or cyanosis. No masses, redness, swelling, asymmetry, or associated skin lesions. No contractures.   Skin & Extremity Inspection: Skin color, temperature, and hair growth are WNL. No peripheral edema or cyanosis. No masses, redness, swelling, asymmetry, or associated skin lesions. No contractures.  Functional ROM: Pain restricted ROM         Limited SLR (straight leg raise)   Functional ROM: Pain restricted ROM for all joints of the lower extremity Limited SLR (straight leg raise)  Muscle Tone/Strength: Functionally intact. No obvious neuro-muscular anomalies detected.   Muscle Tone/Strength: Functionally intact. No obvious neuro-muscular anomalies detected.  Sensory (Neurological): Unimpaired         Sensory (Neurological): Unimpaired  DTR: Patellar: deferred today Achilles: deferred today Plantar: deferred today   DTR: Patellar: deferred today Achilles: deferred today Plantar: deferred today  Palpation: No palpable anomalies   Palpation: No palpable anomalies     Assessment   Diagnosis  Status  1. Chronic radicular lumbar pain   2. Spinal stenosis, lumbar region, with neurogenic claudication   3. Chronic pain syndrome    Having a Flare-up Controlled Controlled   Updated Problems: Problem  Spinal Stenosis, Lumbar Region, With Neurogenic Claudication  Chronic Pain Syndrome  Chronic Radicular Lumbar Pain    Plan of Care    Orders:  Orders Placed This Encounter  Procedures   Lumbar Epidural Injection    Standing Status:   Future    Standing Expiration Date:   05/31/2022    Scheduling Instructions:     Procedure: Interlaminar Lumbar Epidural Steroid injection (LESI)            Left L4/5, with PO Valium    Order Specific Question:   Where will this procedure be performed?    Answer:   [facility]   Follow-up plan:   Return in about 2 weeks (around 05/14/2022) for Left L4/5 ESI , in clinic (PO Valium).     L4/L5 ESI 06/04/21 6 cc injected     Recent Visits No visits were found meeting these conditions. Showing recent visits within past 90 days and meeting all other requirements Today's Visits Date Type Provider Dept  04/30/22 Office Visit Gillis Santa, MD [facility]  Showing today's visits and meeting all other requirements Future Appointments No visits were found meeting these conditions. Showing future appointments within next 90 days and meeting all other requirements  I discussed the assessment and treatment plan with the patient. The patient was provided an opportunity to ask questions and all were answered. The patient agreed with the plan and demonstrated an understanding of the instructions.  Patient advised to call back or seek an in-person evaluation if the symptoms or condition worsens.  Duration of encounter: 40mnutes.  Note by: BGillis Santa MD Date: 04/30/2022; Time: 2:19 PM

## 2022-05-07 ENCOUNTER — Other Ambulatory Visit: Payer: Self-pay | Admitting: Family Medicine

## 2022-05-07 ENCOUNTER — Other Ambulatory Visit: Payer: Self-pay | Admitting: Internal Medicine

## 2022-05-07 DIAGNOSIS — E1165 Type 2 diabetes mellitus with hyperglycemia: Secondary | ICD-10-CM

## 2022-05-08 ENCOUNTER — Other Ambulatory Visit: Payer: Self-pay | Admitting: Internal Medicine

## 2022-05-08 DIAGNOSIS — E1165 Type 2 diabetes mellitus with hyperglycemia: Secondary | ICD-10-CM

## 2022-05-08 NOTE — Telephone Encounter (Signed)
Requested Prescriptions  Pending Prescriptions Disp Refills  . LINZESS 290 MCG CAPS capsule [Pharmacy Med Name: Rolan Lipa 290MCG CAPSULES] 90 capsule 1    Sig: TAKE 1 CAPSULE(290 MCG) BY MOUTH DAILY     Gastroenterology: Irritable Bowel Syndrome Passed - 05/07/2022  7:29 AM      Passed - Valid encounter within last 12 months    Recent Outpatient Visits          3 months ago Type 2 diabetes mellitus with hyperglycemia, without long-term current use of insulin [facility])   [facility] Teodora Medici, DO   6 months ago Type 2 diabetes mellitus with hyperglycemia, without long-term current use of insulin [facility] [place])   [place], DO   10 months ago Hypertension goal BP (blood pressure) < 130/80   [place], DO   1 year ago Hypertension goal BP (blood pressure) < 130/80   [place], DO   1 year ago Type 2 diabetes mellitus with hyperglycemia, without long-term current use of insulin [facility])   [place], [place], DO      Future Appointments            In 1 month Bacigalupo, Dionne Bucy, MD [facility] [place]   In 10 months Philip Aspen, CNM [facility]

## 2022-05-08 NOTE — Telephone Encounter (Signed)
Requested medication (s) are due for refill today: expired medication  Requested medication (s) are on the active medication list: yes  Last refill:  03/28/22-04/27/22 #120 0 refills  Future visit scheduled: yes in 1 month with [facility]  Notes to clinic:  expired medication. Do you want to renew Rx?     Requested Prescriptions  Pending Prescriptions Disp Refills   metFORMIN (GLUCOPHAGE-XR) 500 MG 24 hr tablet [Pharmacy Med Name: METFORMIN ER 500MG 24HR TABS] 120 tablet 0    Sig: TAKE 2 TABLETS(1000 MG) BY MOUTH TWICE DAILY     Endocrinology:  Diabetes - Biguanides Failed - 05/07/2022  7:30 AM      Failed - B12 Level in normal range and within 720 days    No results found for: VITAMINB12       Failed - CBC within normal limits and completed in the last 12 months    WBC  Date Value Ref Range Status  09/07/2020 4.8 3.8 - 10.8 Thousand/uL Final   RBC  Date Value Ref Range Status  09/07/2020 3.98 3.80 - 5.10 Million/uL Final   Hemoglobin  Date Value Ref Range Status  09/07/2020 12.3 11.7 - 15.5 g/dL Final   HCT  Date Value Ref Range Status  09/07/2020 37.2 35.0 - 45.0 % Final   MCHC  Date Value Ref Range Status  09/07/2020 33.1 32.0 - 36.0 g/dL Final   [facility]  Date Value Ref Range Status  09/07/2020 30.9 27.0 - 33.0 pg Final   MCV  Date Value Ref Range Status  09/07/2020 93.5 80.0 - 100.0 fL Final   No results found for: PLTCOUNTKUC, LABPLAT, POCPLA RDW  Date Value Ref Range Status  09/07/2020 13.4 11.0 - 15.0 % Final         Passed - Cr in normal range and within 360 days    Creat  Date Value Ref Range Status  02/06/2022 0.93 0.50 - 1.03 mg/dL Final   Creatinine, Urine  Date Value Ref Range Status  10/29/2021 98 20 - 275 mg/dL Final         Passed - HBA1C is between 0 and 7.9 and within 180 days    Hgb A1c MFr Bld  Date Value Ref Range Status  02/06/2022 7.7 (H) <5.7 % of total Hgb Final    Comment:    For someone without known diabetes, a hemoglobin  A1c value of 6.5% or greater indicates that they may have  diabetes and this should be confirmed with a follow-up  test. . For someone with known diabetes, a value <7% indicates  that their diabetes is well controlled and a value  greater than or equal to 7% indicates suboptimal  control. A1c targets should be individualized based on  duration of diabetes, age, comorbid conditions, and  other considerations. . Currently, no consensus exists regarding use of hemoglobin A1c for diagnosis of diabetes for children. .          Passed - eGFR in normal range and within 360 days    GFR, Est African American  Date Value Ref Range Status  09/07/2020 96 > OR = 60 mL/min/1.17m Final   GFR, Est Non African American  Date Value Ref Range Status  09/07/2020 83 > OR = 60 mL/min/1.763mFinal   eGFR  Date Value Ref Range Status  02/06/2022 73 > OR = 60 mL/min/1.7341minal    Comment:    The eGFR is based on the CKD-EPI 2021 equation. To calculate  the new  eGFR from a previous Creatinine or Cystatin C result, go to https://www.kidney.org/professionals/ kdoqi/gfr%5Fcalculator          Passed - Valid encounter within last 6 months    Recent Outpatient Visits           3 months ago Type 2 diabetes mellitus with hyperglycemia, without long-term current use of insulin [facility])   [facility] Teodora Medici, DO   6 months ago Type 2 diabetes mellitus with hyperglycemia, without long-term current use of insulin [facility])   [place], DO   10 months ago Hypertension goal BP (blood pressure) < 130/80   [place], DO   1 year ago Hypertension goal BP (blood pressure) < 130/80   [place], DO   1 year ago Type 2 diabetes mellitus with hyperglycemia, without long-term current use of insulin [facility])   [place], [place], DO        Future Appointments             In 1 month Bacigalupo, Dionne Bucy, MD [facility] [place]   In 10 months Philip Aspen, CNM [facility]

## 2022-05-09 NOTE — Telephone Encounter (Signed)
Given one month supply yesterday. Requested Prescriptions  Pending Prescriptions Disp Refills  . metFORMIN (GLUCOPHAGE-XR) 500 MG 24 hr tablet [Pharmacy Med Name: METFORMIN ER 500MG 24HR TABS] 360 tablet     Sig: TAKE 2 TABLETS(1000 MG) BY MOUTH TWICE DAILY     Endocrinology:  Diabetes - Biguanides Failed - 05/08/2022  1:19 PM      Failed - B12 Level in normal range and within 720 days    No results found for: VITAMINB12       Failed - CBC within normal limits and completed in the last 12 months    WBC  Date Value Ref Range Status  09/07/2020 4.8 3.8 - 10.8 Thousand/uL Final   RBC  Date Value Ref Range Status  09/07/2020 3.98 3.80 - 5.10 Million/uL Final   Hemoglobin  Date Value Ref Range Status  09/07/2020 12.3 11.7 - 15.5 g/dL Final   HCT  Date Value Ref Range Status  09/07/2020 37.2 35.0 - 45.0 % Final   MCHC  Date Value Ref Range Status  09/07/2020 33.1 32.0 - 36.0 g/dL Final   [facility]  Date Value Ref Range Status  09/07/2020 30.9 27.0 - 33.0 pg Final   MCV  Date Value Ref Range Status  09/07/2020 93.5 80.0 - 100.0 fL Final   No results found for: PLTCOUNTKUC, LABPLAT, POCPLA RDW  Date Value Ref Range Status  09/07/2020 13.4 11.0 - 15.0 % Final         Passed - Cr in normal range and within 360 days    Creat  Date Value Ref Range Status  02/06/2022 0.93 0.50 - 1.03 mg/dL Final   Creatinine, Urine  Date Value Ref Range Status  10/29/2021 98 20 - 275 mg/dL Final         Passed - HBA1C is between 0 and 7.9 and within 180 days    Hgb A1c MFr Bld  Date Value Ref Range Status  02/06/2022 7.7 (H) <5.7 % of total Hgb Final    Comment:    For someone without known diabetes, a hemoglobin A1c value of 6.5% or greater indicates that they may have  diabetes and this should be confirmed with a follow-up  test. . For someone with known diabetes, a value <7% indicates  that their diabetes is well controlled and a value  greater than or equal to 7% indicates  suboptimal  control. A1c targets should be individualized based on  duration of diabetes, age, comorbid conditions, and  other considerations. . Currently, no consensus exists regarding use of hemoglobin A1c for diagnosis of diabetes for children. .          Passed - eGFR in normal range and within 360 days    GFR, Est African American  Date Value Ref Range Status  09/07/2020 96 > OR = 60 mL/min/1.78m Final   GFR, Est Non African American  Date Value Ref Range Status  09/07/2020 83 > OR = 60 mL/min/1.748mFinal   eGFR  Date Value Ref Range Status  02/06/2022 73 > OR = 60 mL/min/1.7345minal    Comment:    The eGFR is based on the CKD-EPI 2021 equation. To calculate  the new eGFR from a previous Creatinine or Cystatin C result, go to https://www.kidney.org/professionals/ kdoqi/gfr%5Fcalculator          Passed - Valid encounter within last 6 months    Recent Outpatient Visits          3 months ago Type 2 diabetes  mellitus with hyperglycemia, without long-term current use of insulin [facility])   [facility] Teodora Medici, DO   6 months ago Type 2 diabetes mellitus with hyperglycemia, without long-term current use of insulin [facility])   [place], DO   10 months ago Hypertension goal BP (blood pressure) < 130/80   [place], DO   1 year ago Hypertension goal BP (blood pressure) < 130/80   [place], DO   1 year ago Type 2 diabetes mellitus with hyperglycemia, without long-term current use of insulin [facility])   [place], [place], DO      Future Appointments            In 1 month Bacigalupo, Dionne Bucy, MD [facility], [place]   In 10 months Philip Aspen, CNM [facility]

## 2022-05-15 ENCOUNTER — Ambulatory Visit
Admission: RE | Admit: 2022-05-15 | Discharge: 2022-05-15 | Disposition: A | Discharge status: Home / Self Care | Payer: Medicare Other | Source: Ambulatory Visit | Attending: Student in an Organized Health Care Education/Training Program | Admitting: Student in an Organized Health Care Education/Training Program

## 2022-05-15 ENCOUNTER — Encounter: Payer: Self-pay | Admitting: Student in an Organized Health Care Education/Training Program

## 2022-05-15 ENCOUNTER — Other Ambulatory Visit: Payer: Self-pay

## 2022-05-15 ENCOUNTER — Ambulatory Visit
Payer: Medicare Other | Attending: Student in an Organized Health Care Education/Training Program | Admitting: Student in an Organized Health Care Education/Training Program

## 2022-05-15 DIAGNOSIS — M48062 Spinal stenosis, lumbar region with neurogenic claudication: Secondary | ICD-10-CM | POA: Diagnosis present

## 2022-05-15 DIAGNOSIS — G8929 Other chronic pain: Secondary | ICD-10-CM | POA: Diagnosis present

## 2022-05-15 DIAGNOSIS — G894 Chronic pain syndrome: Secondary | ICD-10-CM | POA: Insufficient documentation

## 2022-05-15 DIAGNOSIS — M5416 Radiculopathy, lumbar region: Secondary | ICD-10-CM | POA: Diagnosis not present

## 2022-05-15 MED ORDER — ROPIVACAINE HCL 2 MG/ML IJ SOLN
2.0000 mL | Freq: Once | INTRAMUSCULAR | Status: AC
  Administered 2022-05-15: 2 mL via EPIDURAL
  Filled 2022-05-15: qty 20

## 2022-05-15 MED ORDER — DEXAMETHASONE SODIUM PHOSPHATE 10 MG/ML IJ SOLN
10.0000 mg | Freq: Once | INTRAMUSCULAR | Status: AC
  Administered 2022-05-15: 10 mg
  Filled 2022-05-15: qty 1

## 2022-05-15 MED ORDER — LIDOCAINE HCL 2 % IJ SOLN
20.0000 mL | Freq: Once | INTRAMUSCULAR | Status: AC
  Administered 2022-05-15: 400 mg
  Filled 2022-05-15: qty 20

## 2022-05-15 MED ORDER — IOHEXOL 180 MG/ML  SOLN
10.0000 mL | Freq: Once | INTRAMUSCULAR | Status: AC
  Administered 2022-05-15: 10 mL via EPIDURAL

## 2022-05-15 MED ORDER — DIAZEPAM 5 MG PO TABS
5.0000 mg | ORAL_TABLET | ORAL | Status: AC
  Administered 2022-05-15: 5 mg via ORAL

## 2022-05-15 MED ORDER — DIAZEPAM 5 MG PO TABS
ORAL_TABLET | ORAL | Status: AC
  Filled 2022-05-15: qty 1

## 2022-05-15 MED ORDER — SODIUM CHLORIDE 0.9% FLUSH
2.0000 mL | Freq: Once | INTRAVENOUS | Status: AC
  Administered 2022-05-15: 2 mL

## 2022-05-15 NOTE — Progress Notes (Signed)
Safety precautions to be maintained throughout the outpatient stay will include: orient to surroundings, keep bed in low position, maintain call bell within reach at all times, provide assistance with transfer out of bed and ambulation.  

## 2022-05-15 NOTE — Patient Instructions (Signed)

## 2022-05-15 NOTE — Progress Notes (Signed)
PROVIDER NOTE: Information contained herein reflects review and annotations entered in association with encounter. Interpretation of such information and data should be left to medically-trained personnel. Information provided to patient can be located elsewhere in the medical record under "Patient Instructions". Document created using STT-dictation technology, any transcriptional errors that may result from process are unintentional.    Patient: Elizabeth [name]  Service Category: Procedure  Provider: Gillis Santa, MD  DOB: January 19, 1966  DOS: 05/15/2022  Location: [facility]  MRN: [id number]  Setting: Ambulatory - outpatient  Referring Provider: Gillis Santa, MD  Type: Established Patient  Specialty: Interventional Pain Management  PCP: Teodora Medici, DO   Primary Reason for Visit: Interventional Pain Management Treatment. CC: Back Pain (Lumbar bilateral )   Procedure:          Anesthesia, Analgesia, Anxiolysis:  Type: Therapeutic Inter-Laminar Epidural Steroid Injection  #1  Region: Lumbar Level: L4-5 Level. Laterality: Left-Sided         Type: Local Anesthesia with PO Valium  Local Anesthetic: Lidocaine 1-2%  Position: Prone with head of the table was raised to facilitate breathing.   Indications: 1. Chronic radicular lumbar pain   2. Spinal stenosis, lumbar region, with neurogenic claudication   3. Chronic pain syndrome    Pain Score: Pre-procedure: 8 /10 Post-procedure: 0-No pain/10   Pre-op H&P Assessment:  Elizabeth [name] is a 56 y.o. (year old), female patient, seen today for interventional treatment. She  has a past surgical history that includes Tubal ligation; Esophagogastroduodenoscopy (egd) with esophageal dilation; Cholecystectomy (1997); Colonoscopy with propofol (N/A, 09/07/2015); Esophagogastroduodenoscopy (egd) with propofol (N/A, 09/07/2015); polypectomy (N/A, 09/07/2015); Partial colectomy (N/A, 01/24/2016); Bowel resection; Colonoscopy with propofol (N/A,  05/16/2020); Breast biopsy (Right, 12/22/2019); and Breast biopsy. Elizabeth [name] has a current medication list which includes the following prescription(s): accu-chek fastclix lancets, ammonium lactate, aspirin, atorvastatin, azelastine, b-d ins syr ultrafine 1cc/31g, accu-chek guide, cholecalciferol, clobetasol ointment, empagliflozin, ferrous sulfate, fluticasone, folic acid, accu-chek guide, linzess, melatonin, metformin, methotrexate (pf), multiple vitamins-minerals, olanzapine, omega-3 fatty acids, OVER THE COUNTER MEDICATION, tizanidine, vitamin a, vitamin b-12, zinc, gabapentin, sertraline, trazodone, and xiidra. Her primarily concern today is the Back Pain (Lumbar bilateral )   Initial Vital Signs:  Pulse/HCG Rate: 88  Temp: (!) 97.4 F (36.3 C) Resp: 16 BP: (!) 141/82 SpO2: 98 %  BMI: Estimated body mass index is 36.25 kg/m as calculated from the following:   Height as of this encounter: 5' 6.5" (1.689 m).   Weight as of this encounter: 228 lb (103.4 kg).  Risk Assessment: Allergies: Reviewed. She is allergic to ace inhibitors, enalapril, penicillins, and statins.  Allergy Precautions: None required Coagulopathies: Reviewed. None identified.  Blood-thinner therapy: None at this time Active Infection(s): Reviewed. None identified. Elizabeth [name] is afebrile  Site Confirmation: Elizabeth [name] was asked to confirm the procedure and laterality before marking the site Procedure checklist: Completed Consent: Before the procedure and under the influence of no sedative(s), amnesic(s), or anxiolytics, the patient was informed of the treatment options, risks and possible complications. To fulfill our ethical and legal obligations, as recommended by the American Medical Association's Code of Ethics, I have informed the patient of my clinical impression; the nature and purpose of the treatment or procedure; the risks, benefits, and possible complications of the intervention; the alternatives, including  doing nothing; the risk(s) and benefit(s) of the alternative treatment(s) or procedure(s); and the risk(s) and benefit(s) of doing nothing. The patient was provided information about the general risks and possible complications  associated with the procedure. These may include, but are not limited to: failure to achieve desired goals, infection, bleeding, organ or nerve damage, allergic reactions, paralysis, and death. In addition, the patient was informed of those risks and complications associated to Spine-related procedures, such as failure to decrease pain; infection (i.e.: Meningitis, epidural or intraspinal abscess); bleeding (i.e.: epidural hematoma, subarachnoid hemorrhage, or any other type of intraspinal or peri-dural bleeding); organ or nerve damage (i.e.: Any type of peripheral nerve, nerve root, or spinal cord injury) with subsequent damage to sensory, motor, and/or autonomic systems, resulting in permanent pain, numbness, and/or weakness of one or several areas of the body; allergic reactions; (i.e.: anaphylactic reaction); and/or death. Furthermore, the patient was informed of those risks and complications associated with the medications. These include, but are not limited to: allergic reactions (i.e.: anaphylactic or anaphylactoid reaction(s)); adrenal axis suppression; blood sugar elevation that in diabetics may result in ketoacidosis or comma; water retention that in patients with history of congestive heart failure may result in shortness of breath, pulmonary edema, and decompensation with resultant heart failure; weight gain; swelling or edema; medication-induced neural toxicity; particulate matter embolism and blood vessel occlusion with resultant organ, and/or nervous system infarction; and/or aseptic necrosis of one or more joints. Finally, the patient was informed that Medicine is not an exact science; therefore, there is also the possibility of unforeseen or unpredictable risks and/or  possible complications that may result in a catastrophic outcome. The patient indicated having understood very clearly. We have given the patient no guarantees and we have made no promises. Enough time was given to the patient to ask questions, all of which were answered to the patient's satisfaction. Elizabeth [name] has indicated that she wanted to continue with the procedure. Attestation: I, the ordering provider, attest that I have discussed with the patient the benefits, risks, side-effects, alternatives, likelihood of achieving goals, and potential problems during recovery for the procedure that I have provided informed consent. Date  Time: 05/15/2022  9:29 AM  Pre-Procedure Preparation:  Monitoring: As per clinic protocol. Respiration, ETCO2, SpO2, BP, heart rate and rhythm monitor placed and checked for adequate function Safety Precautions: Patient was assessed for positional comfort and pressure points before starting the procedure. Time-out: I initiated and conducted the "Time-out" before starting the procedure, as per protocol. The patient was asked to participate by confirming the accuracy of the "Time Out" information. Verification of the correct person, site, and procedure were performed and confirmed by me, the nursing staff, and the patient. "Time-out" conducted as per Joint Commission's Universal Protocol (UP.01.01.01). Time: 7342  Description of Procedure:          Target Area: The interlaminar space, initially targeting the lower laminar border of the superior vertebral body. Approach: Paramedial approach. Area Prepped: Entire Posterior Lumbar Region DuraPrep (Iodine Povacrylex [0.7% available iodine] and Isopropyl Alcohol, 74% w/w) Safety Precautions: Aspiration looking for blood return was conducted prior to all injections. At no point did we inject any substances, as a needle was being advanced. No attempts were made at seeking any paresthesias. Safe injection practices and needle  disposal techniques used. Medications properly checked for expiration dates. SDV (single dose vial) medications used. Description of the Procedure: Protocol guidelines were followed. The procedure needle was introduced through the skin, ipsilateral to the reported pain, and advanced to the target area. Bone was contacted and the needle walked caudad, until the lamina was cleared. The epidural space was identified using "loss-of-resistance technique" with 2-3 ml of  PF-NaCl (0.9% NSS), in a 5cc LOR glass syringe.  Vitals:   05/15/22 0932 05/15/22 0950 05/15/22 0955 05/15/22 1000  BP: (!) 141/82 (!) 142/90 (!) 146/93 (!) 142/92  Pulse: 88 84 82 81  Resp: '16 16 18 16  '$ Temp: (!) 97.4 F (36.3 C)     TempSrc: Temporal     SpO2: 98% 97% 97% 97%  Weight: 228 lb (103.4 kg)     Height: 5' 6.5" (1.689 m)        Start Time: 0954 hrs. End Time: 0957 hrs.  Materials:  Needle(s) Type: Epidural needle Gauge: 22G Length: 3.5-in Medication(s): Please see orders for medications and dosing details. 6 cc solution made of 3 cc of preservative-free saline, 2 cc of 0.2% ropivacaine, 1 cc of Decadron 10 mg/cc.  Imaging Guidance (Spinal):          Type of Imaging Technique: Fluoroscopy Guidance (Spinal) Indication(s): Assistance in needle guidance and placement for procedures requiring needle placement in or near specific anatomical locations not easily accessible without such assistance. Exposure Time: Please see nurses notes. Contrast: Before injecting any contrast, we confirmed that the patient did not have an allergy to iodine, shellfish, or radiological contrast. Once satisfactory needle placement was completed at the desired level, radiological contrast was injected. Contrast injected under live fluoroscopy. No contrast complications. See chart for type and volume of contrast used. Fluoroscopic Guidance: I was personally present during the use of fluoroscopy. "Tunnel Vision Technique" used to obtain the  best possible view of the target area. Parallax error corrected before commencing the procedure. "Direction-depth-direction" technique used to introduce the needle under continuous pulsed fluoroscopy. Once target was reached, antero-posterior, oblique, and lateral fluoroscopic projection used confirm needle placement in all planes. Images permanently stored in EMR. Interpretation: I personally interpreted the imaging intraoperatively. Adequate needle placement confirmed in multiple planes. Appropriate spread of contrast into desired area was observed. No evidence of afferent or efferent intravascular uptake. No intrathecal or subarachnoid spread observed. Permanent images saved into the patient's record.   Post-operative Assessment:  Post-procedure Vital Signs:  Pulse/HCG Rate: 81  Temp: (!) 97.4 F (36.3 C) Resp: 16 BP: (!) 142/92 SpO2: 97 %  EBL: None  Complications: No immediate post-treatment complications observed by team, or reported by patient.  Note: The patient tolerated the entire procedure well. A repeat set of vitals were taken after the procedure and the patient was kept under observation following institutional policy, for this type of procedure. Post-procedural neurological assessment was performed, showing return to baseline, prior to discharge. The patient was provided with post-procedure discharge instructions, including a section on how to identify potential problems. Should any problems arise concerning this procedure, the patient was given instructions to immediately contact us, at any time, without hesitation. In any case, we plan to contact the patient by telephone for a follow-up status report regarding this interventional procedure.  Comments:  No additional relevant information.   5 out of 5 strength bilateral lower extremity: Plantar flexion, dorsiflexion, knee flexion, knee extension.  Plan of Care  Orders:  Orders Placed This Encounter  Procedures   DG PAIN  CLINIC C-ARM 1-60 MIN NO REPORT    Intraoperative interpretation by procedural physician at [place].    Standing Status:   Standing    Number of Occurrences:   1    Order Specific Question:   Reason for exam:    Answer:   Assistance in needle guidance and placement for procedures requiring needle placement  in or near specific anatomical locations not easily accessible without such assistance.    Medications ordered for procedure: Meds ordered this encounter  Medications   iohexol (OMNIPAQUE) 180 MG/ML injection 10 mL    Must be Myelogram-compatible. If not available, you may substitute with a water-soluble, non-ionic, hypoallergenic, myelogram-compatible radiological contrast medium.   lidocaine (XYLOCAINE) 2 % (with pres) injection 400 mg   diazepam (VALIUM) tablet 5 mg    Make sure Flumazenil is available in the pyxis when using this medication. If oversedation occurs, administer 0.2 mg IV over 15 sec. If after 45 sec no response, administer 0.2 mg again over 1 min; may repeat at 1 min intervals; not to exceed 4 doses (1 mg)   sodium chloride flush (NS) 0.9 % injection 2 mL   ropivacaine (PF) 2 mg/mL (0.2%) (NAROPIN) injection 2 mL   dexamethasone (DECADRON) injection 10 mg   Medications administered: We administered iohexol, lidocaine, diazepam, sodium chloride flush, ropivacaine (PF) 2 mg/mL (0.2%), and dexamethasone.  See the medical record for exact dosing, route, and time of administration.  Follow-up plan:   Return in about 6 weeks (around 06/26/2022) for Post Procedure Evaluation, virtual.      L4/L5 ESI 06/04/21, 05/15/22 6 cc injected  Recent Visits Date Type Provider Dept  04/30/22 Office Visit Gillis Santa, MD [facility]  Showing recent visits within past 90 days and meeting all other requirements Today's Visits Date Type Provider Dept  05/15/22 Procedure visit Gillis Santa, MD [facility]  Showing today's visits and meeting all other  requirements Future Appointments Date Type Provider Dept  06/19/22 Appointment Gillis Santa, MD [facility]  Showing future appointments within next 90 days and meeting all other requirements  Disposition: Discharge home  Discharge (Date  Time): 05/15/2022;   hrs.   Primary Care Physician: Teodora Medici, DO Location: [facility] Note by: Gillis Santa, MD Date: 05/15/2022; Time: 10:09 AM  Disclaimer:  Medicine is not an exact science. The only guarantee in medicine is that nothing is guaranteed. It is important to note that the decision to proceed with this intervention was based on the information collected from the patient. The Data and conclusions were drawn from the patient's questionnaire, the interview, and the physical examination. Because the information was provided in large part by the patient, it cannot be guaranteed that it has not been purposely or unconsciously manipulated. Every effort has been made to obtain as much relevant data as possible for this evaluation. It is important to note that the conclusions that lead to this procedure are derived in large part from the available data. Always take into account that the treatment will also be dependent on availability of resources and existing treatment guidelines, considered by other Pain Management Practitioners as being common knowledge and practice, at the time of the intervention. For Medico-Legal purposes, it is also important to point out that variation in procedural techniques and pharmacological choices are the acceptable norm. The indications, contraindications, technique, and results of the above procedure should only be interpreted and judged by a Board-Certified Interventional Pain Specialist with extensive familiarity and expertise in the same exact procedure and technique.

## 2022-05-16 ENCOUNTER — Telehealth: Payer: Self-pay

## 2022-05-16 NOTE — Telephone Encounter (Signed)
Post procedure phone call.  Patient states she is doing ok.  

## 2022-05-21 LAB — HM DIABETES EYE EXAM

## 2022-05-27 ENCOUNTER — Telehealth: Payer: Self-pay | Admitting: Family Medicine

## 2022-05-28 ENCOUNTER — Ambulatory Visit (INDEPENDENT_AMBULATORY_CARE_PROVIDER_SITE_OTHER): Payer: Medicare Other | Admitting: Podiatry

## 2022-05-28 DIAGNOSIS — L600 Ingrowing nail: Secondary | ICD-10-CM

## 2022-05-28 NOTE — Progress Notes (Signed)
Subjective:  Patient ID: Elizabeth [name], female    DOB: June 29, 1966,  MRN: [id number]  Chief Complaint  Patient presents with   Diabetes    56 y.o. female presents with the above complaint.  Patient presents with complaint of right medial border ingrown pain.  Patient states is achy.  She is a diabetic with last A1c of 7.7.  She states she is worried that her new A1c will be higher.  She just wants to discuss treatment plans for it.  She does not want to lose her toe so therefore she does not want to the procedure.  She denies any other acute complaints.   Review of Systems: Negative except as noted in the HPI. Denies N/V/F/Ch.  Past Medical History:  Diagnosis Date   Bipolar 1 disorder ([facility])    Depression    Diabetes mellitus without complication (HCC)    Diverticulosis    GERD (gastroesophageal reflux disease)    Heart murmur    followed by PCP   Hepatitis    B - "not active"   Hypertension goal BP (blood pressure) < 130/80 01/17/2016   Insomnia    Irritable bowel syndrome    Motion sickness    boats   Obesity (BMI 30-39.9) 07/03/2015   RA (rheumatoid arthritis) (HCC)    Sciatica     Current Outpatient Medications:    Accu-Chek FastClix Lancets MISC, USE TO CHECK BLOOD SUGAR TWICE DAILY, Disp: 102 each, Rfl: 1   ammonium lactate (AMLACTIN) 12 % lotion, Apply 1 application. topically as needed for dry skin., Disp: 400 g, Rfl: 0   aspirin 81 MG tablet, Take 81 mg by mouth daily., Disp: , Rfl:    atorvastatin (LIPITOR) 10 MG tablet, TAKE 1 TABLET(10 MG) BY MOUTH AT BEDTIME, Disp: 90 tablet, Rfl: 1   azelastine (ASTELIN) 0.1 % nasal spray, Place 2 sprays into both nostrils 2 (two) times daily. Use in each nostril as directed, Disp: 30 mL, Rfl: 12   B-D INS SYR ULTRAFINE 1CC/31G 31G X 5/16" 1 ML MISC, , Disp: , Rfl: 1   Blood Glucose Monitoring Suppl (ACCU-CHEK GUIDE) w/Device KIT, AS DIRECTED EVERY DAY, Disp: 1 kit, Rfl: 0   cholecalciferol (VITAMIN D) 1000 units tablet, Take  1,000 Units by mouth daily., Disp: , Rfl:    clobetasol ointment (TEMOVATE) 0.05 %, APPLY EXTERNALLY TO THE AFFECTED AREA TWICE DAILY, Disp: , Rfl:    empagliflozin (JARDIANCE) 25 MG TABS tablet, Take 1 tablet (25 mg total) by mouth daily before breakfast., Disp: 30 tablet, Rfl: 3   ferrous sulfate 324 (65 Fe) MG TBEC, Take 1 tablet (325 mg total) by mouth daily., Disp: 90 tablet, Rfl: 3   fluticasone (FLONASE) 50 MCG/ACT nasal spray, Place into both nostrils daily., Disp: , Rfl:    folic acid (FOLVITE) 1 MG tablet, Take 1 mg by mouth daily., Disp: , Rfl:    gabapentin (NEURONTIN) 600 MG tablet, Take 1 tablet (600 mg total) by mouth 3 (three) times daily. (Patient not taking: Reported on 04/30/2022), Disp: 90 tablet, Rfl: 5   glucose blood (ACCU-CHEK GUIDE) test strip, USE TO CHECK BLOOD SUGARS AS DIRECTED BY MD, Disp: 50 strip, Rfl: 5   LINZESS 290 MCG CAPS capsule, TAKE 1 CAPSULE(290 MCG) BY MOUTH DAILY, Disp: 90 capsule, Rfl: 0   melatonin 5 MG TABS, Take 5 mg by mouth at bedtime as needed., Disp: , Rfl:    metFORMIN (GLUCOPHAGE-XR) 500 MG 24 hr tablet, TAKE 2 TABLETS(1000 MG) BY  MOUTH TWICE DAILY, Disp: 120 tablet, Rfl: 0   Methotrexate Sodium (METHOTREXATE, PF,) 50 MG/2ML injection, 15 mg once a week., Disp: , Rfl:    Multiple Vitamins-Minerals (MULTIVITAMIN/EXTRA VITAMIN D3 PO), Take by mouth., Disp: , Rfl:    OLANZapine (ZYPREXA) 15 MG tablet, Take 1 tablet (15 mg total) by mouth at bedtime., Disp: 30 tablet, Rfl: 0   Omega-3 Fatty Acids (FISH OIL PO), Take by mouth., Disp: , Rfl:    OVER THE COUNTER MEDICATION, once daily Herbal Name: Goli, Disp: , Rfl:    sertraline (ZOLOFT) 50 MG tablet, 25 mg at night for one week, then 50 mg at night (Patient not taking: Reported on 05/15/2022), Disp: 30 tablet, Rfl: 1   tiZANidine (ZANAFLEX) 4 MG tablet, Take 1 tablet (4 mg total) by mouth at bedtime as needed for muscle spasms., Disp: 30 tablet, Rfl: 5   traZODone (DESYREL) 100 MG tablet, Take 200 mg by  mouth at bedtime as needed. (Patient not taking: Reported on 05/15/2022), Disp: , Rfl:    Vitamin A 2400 MCG (8000 UT) TABS, Take by mouth., Disp: , Rfl:    vitamin B-12 (CYANOCOBALAMIN) 1000 MCG tablet, Take 1,000 mcg by mouth daily., Disp: , Rfl:    XIIDRA 5 % SOLN, Place 1 drop into both eyes 2 (two) times daily. (Patient not taking: Reported on 05/15/2022), Disp: , Rfl:    Zinc 22.5 MG TABS, Take by mouth., Disp: , Rfl:   Social History   Tobacco Use  Smoking Status Former   Packs/day: 1.00   Years: 1.00   Total pack years: 1.00   Types: Cigarettes   Quit date: 05/06/2015   Years since quitting: 7.0  Smokeless Tobacco Never  Tobacco Comments   smoking cessation materials not required    Allergies  Allergen Reactions   Ace Inhibitors Swelling    Angioedema (specifically from enalapril)   Enalapril Swelling    angioedema   Penicillins Hives and Swelling    Has patient had a PCN reaction causing immediate rash, facial/tongue/throat swelling, SOB or lightheadedness with hypotension: Yes Has patient had a PCN reaction causing severe rash involving mucus membranes or skin necrosis: No Has patient had a PCN reaction that required hospitalization Yes Has patient had a PCN reaction occurring within the last 10 years: No If all of the above answers are "NO", then may proceed with Cephalosporin use.   Statins Hives   Objective:  There were no vitals filed for this visit. There is no height or weight on file to calculate BMI. Constitutional Well developed. Well nourished.  Vascular Dorsalis pedis pulses palpable bilaterally. Posterior tibial pulses palpable bilaterally. Capillary refill normal to all digits.  No cyanosis or clubbing noted. Pedal hair growth normal.  Neurologic Normal speech. Oriented to person, place, and time. Epicritic sensation to light touch grossly present bilaterally.  Dermatologic Painful ingrowing nail at medial nail borders of the hallux nail right. No  other open wounds. No skin lesions.  Orthopedic: Normal joint ROM without pain or crepitus bilaterally. No visible deformities. No bony tenderness.   Radiographs: None Assessment:   1. Ingrown toenail of right foot    Plan:  Patient was evaluated and treated and all questions answered.  Ingrown Nail, right -I explained to the patient the etiology of medial border ingrown.  Patient states that it hurts with ambulation.  However she knows that she is diabetic and cannot have it removed.  She states that she will get her new A1c in  about a month and when she is back we can discuss taking it out if her A1c is below 8%.  She states understanding.  No follow-ups on file.

## 2022-05-28 NOTE — Telephone Encounter (Signed)
Pharmacy states glucose blood (ACCU-CHEK GUIDE) test strip was sent w/o directions  Please assist further

## 2022-05-30 ENCOUNTER — Ambulatory Visit: Payer: Medicare Other | Admitting: Podiatry

## 2022-05-31 ENCOUNTER — Other Ambulatory Visit: Payer: Self-pay | Admitting: Student in an Organized Health Care Education/Training Program

## 2022-05-31 DIAGNOSIS — G894 Chronic pain syndrome: Secondary | ICD-10-CM

## 2022-06-07 ENCOUNTER — Other Ambulatory Visit: Payer: Self-pay | Admitting: Internal Medicine

## 2022-06-07 DIAGNOSIS — E1165 Type 2 diabetes mellitus with hyperglycemia: Secondary | ICD-10-CM

## 2022-06-07 NOTE — Telephone Encounter (Signed)
Requested medications are due for refill today.  yes  Requested medications are on the active medications list.  yes  Last refill. 120/0 02/19/2022  Future visit scheduled.   yes  Notes to clinic.  Labs are expired.    Requested Prescriptions  Pending Prescriptions Disp Refills   metFORMIN (GLUCOPHAGE-XR) 500 MG 24 hr tablet [Pharmacy Med Name: METFORMIN ER 500MG 24HR TABS] 120 tablet 0    Sig: TAKE 2 TABLETS(1000 MG) BY MOUTH TWICE DAILY     Endocrinology:  Diabetes - Biguanides Failed - 06/07/2022  8:45 AM      Failed - B12 Level in normal range and within 720 days    No results found for: "VITAMINB12"       Failed - CBC within normal limits and completed in the last 12 months    WBC  Date Value Ref Range Status  09/07/2020 4.8 3.8 - 10.8 Thousand/uL Final   RBC  Date Value Ref Range Status  09/07/2020 3.98 3.80 - 5.10 Million/uL Final   Hemoglobin  Date Value Ref Range Status  09/07/2020 12.3 11.7 - 15.5 g/dL Final   HCT  Date Value Ref Range Status  09/07/2020 37.2 35.0 - 45.0 % Final   MCHC  Date Value Ref Range Status  09/07/2020 33.1 32.0 - 36.0 g/dL Final   [facility]  Date Value Ref Range Status  09/07/2020 30.9 27.0 - 33.0 pg Final   MCV  Date Value Ref Range Status  09/07/2020 93.5 80.0 - 100.0 fL Final   No results found for: "PLTCOUNTKUC", "LABPLAT", "POCPLA" RDW  Date Value Ref Range Status  09/07/2020 13.4 11.0 - 15.0 % Final         Passed - Cr in normal range and within 360 days    Creat  Date Value Ref Range Status  02/06/2022 0.93 0.50 - 1.03 mg/dL Final   Creatinine, Urine  Date Value Ref Range Status  10/29/2021 98 20 - 275 mg/dL Final         Passed - HBA1C is between 0 and 7.9 and within 180 days    Hgb A1c MFr Bld  Date Value Ref Range Status  02/06/2022 7.7 (H) <5.7 % of total Hgb Final    Comment:    For someone without known diabetes, a hemoglobin A1c value of 6.5% or greater indicates that they may have  diabetes and this  should be confirmed with a follow-up  test. . For someone with known diabetes, a value <7% indicates  that their diabetes is well controlled and a value  greater than or equal to 7% indicates suboptimal  control. A1c targets should be individualized based on  duration of diabetes, age, comorbid conditions, and  other considerations. . Currently, no consensus exists regarding use of hemoglobin A1c for diagnosis of diabetes for children. .          Passed - eGFR in normal range and within 360 days    GFR, Est African American  Date Value Ref Range Status  09/07/2020 96 > OR = 60 mL/min/1.5m Final   GFR, Est Non African American  Date Value Ref Range Status  09/07/2020 83 > OR = 60 mL/min/1.714mFinal   eGFR  Date Value Ref Range Status  02/06/2022 73 > OR = 60 mL/min/1.7339minal    Comment:    The eGFR is based on the CKD-EPI 2021 equation. To calculate  the new eGFR from a previous Creatinine or Cystatin C result, go to https://www.kidney.org/professionals/ kdoqi/gfr%5Fcalculator  Passed - Valid encounter within last 6 months    Recent Outpatient Visits           4 months ago Type 2 diabetes mellitus with hyperglycemia, without long-term current use of insulin [facility])   [facility] Teodora Medici, DO   7 months ago Type 2 diabetes mellitus with hyperglycemia, without long-term current use of insulin [facility])   [place], DO   11 months ago Hypertension goal BP (blood pressure) < 130/80   [place], DO   1 year ago Hypertension goal BP (blood pressure) < 130/80   [facility] Rory Percy M, DO   1 year ago Type 2 diabetes mellitus with hyperglycemia, without long-term current use of insulin [facility])   [facility] Myles Gip, DO       Future Appointments             In 3 days Bacigalupo, Dionne Bucy, MD  [facility]   In 9 months Philip Aspen, CNM [facility]            Signed Prescriptions Disp Refills   JARDIANCE 25 MG TABS tablet 30 tablet 2    Sig: TAKE 1 TABLET(25 MG) BY MOUTH DAILY BEFORE BREAKFAST     Endocrinology:  Diabetes - SGLT2 Inhibitors Passed - 06/07/2022  8:45 AM      Passed - Cr in normal range and within 360 days    Creat  Date Value Ref Range Status  02/06/2022 0.93 0.50 - 1.03 mg/dL Final   Creatinine, Urine  Date Value Ref Range Status  10/29/2021 98 20 - 275 mg/dL Final         Passed - HBA1C is between 0 and 7.9 and within 180 days    Hgb A1c MFr Bld  Date Value Ref Range Status  02/06/2022 7.7 (H) <5.7 % of total Hgb Final    Comment:    For someone without known diabetes, a hemoglobin A1c value of 6.5% or greater indicates that they may have  diabetes and this should be confirmed with a follow-up  test. . For someone with known diabetes, a value <7% indicates  that their diabetes is well controlled and a value  greater than or equal to 7% indicates suboptimal  control. A1c targets should be individualized based on  duration of diabetes, age, comorbid conditions, and  other considerations. . Currently, no consensus exists regarding use of hemoglobin A1c for diagnosis of diabetes for children. .          Passed - eGFR in normal range and within 360 days    GFR, Est African American  Date Value Ref Range Status  09/07/2020 96 > OR = 60 mL/min/1.38m Final   GFR, Est Non African American  Date Value Ref Range Status  09/07/2020 83 > OR = 60 mL/min/1.773mFinal   eGFR  Date Value Ref Range Status  02/06/2022 73 > OR = 60 mL/min/1.737minal    Comment:    The eGFR is based on the CKD-EPI 2021 equation. To calculate  the new eGFR from a previous Creatinine or Cystatin C result, go to https://www.kidney.org/professionals/ kdoqi/gfr%5Fcalculator          Passed - Valid encounter within last 6 months     Recent Outpatient Visits           4 months ago Type 2 diabetes mellitus with  hyperglycemia, without long-term current use of insulin [facility])   [facility] Teodora Medici, DO   7 months ago Type 2 diabetes mellitus with hyperglycemia, without long-term current use of insulin [facility])   [place], DO   11 months ago Hypertension goal BP (blood pressure) < 130/80   [place], DO   1 year ago Hypertension goal BP (blood pressure) < 130/80   [facility] Rory Percy M, DO   1 year ago Type 2 diabetes mellitus with hyperglycemia, without long-term current use of insulin [facility])   [facility] Myles Gip, DO       Future Appointments             In 3 days Bacigalupo, Dionne Bucy, MD [facility]   In 9 months Philip Aspen, CNM [facility] FastClix Lancets MISC 180 each 2    Sig: USE TO [place] DAILY     Endocrinology: Diabetes - Testing Supplies Passed - 06/07/2022  8:45 AM      Passed - Valid encounter within last 12 months    Recent Outpatient Visits           4 months ago Type 2 diabetes mellitus with hyperglycemia, without long-term current use of insulin [facility])   [facility] Teodora Medici, DO   7 months ago Type 2 diabetes mellitus with hyperglycemia, without long-term current use of insulin [facility])   [facility] Rory Percy M, DO   11 months ago Hypertension goal BP (blood pressure) < 130/80   [place], DO   1 year ago Hypertension goal BP (blood pressure) < 130/80   [facility] Rory Percy M, DO   1 year ago Type 2 diabetes mellitus with hyperglycemia, without long-term current use of insulin [facility])   [place], [place],  DO       Future Appointments             In 3 days Bacigalupo, Dionne Bucy, MD [facility] [place]   In 9 months Philip Aspen, CNM [facility]             c

## 2022-06-07 NOTE — Telephone Encounter (Signed)
Requested Prescriptions  Pending Prescriptions Disp Refills  . JARDIANCE 25 MG TABS tablet [Pharmacy Med Name: JARDIANCE 25MG TABLETS] 30 tablet 2    Sig: TAKE 1 TABLET(25 MG) BY MOUTH DAILY BEFORE BREAKFAST     Endocrinology:  Diabetes - SGLT2 Inhibitors Passed - 06/07/2022  8:45 AM      Passed - Cr in normal range and within 360 days    Creat  Date Value Ref Range Status  02/06/2022 0.93 0.50 - 1.03 mg/dL Final   Creatinine, Urine  Date Value Ref Range Status  10/29/2021 98 20 - 275 mg/dL Final         Passed - HBA1C is between 0 and 7.9 and within 180 days    Hgb A1c MFr Bld  Date Value Ref Range Status  02/06/2022 7.7 (H) <5.7 % of total Hgb Final    Comment:    For someone without known diabetes, a hemoglobin A1c value of 6.5% or greater indicates that they may have  diabetes and this should be confirmed with a follow-up  test. . For someone with known diabetes, a value <7% indicates  that their diabetes is well controlled and a value  greater than or equal to 7% indicates suboptimal  control. A1c targets should be individualized based on  duration of diabetes, age, comorbid conditions, and  other considerations. . Currently, no consensus exists regarding use of hemoglobin A1c for diagnosis of diabetes for children. .          Passed - eGFR in normal range and within 360 days    GFR, Est African American  Date Value Ref Range Status  09/07/2020 96 > OR = 60 mL/min/1.21m Final   GFR, Est Non African American  Date Value Ref Range Status  09/07/2020 83 > OR = 60 mL/min/1.731mFinal   eGFR  Date Value Ref Range Status  02/06/2022 73 > OR = 60 mL/min/1.7378minal    Comment:    The eGFR is based on the CKD-EPI 2021 equation. To calculate  the new eGFR from a previous Creatinine or Cystatin C result, go to https://www.kidney.org/professionals/ kdoqi/gfr%5Fcalculator          Passed - Valid encounter within last 6 months    Recent Outpatient Visits           4 months ago Type 2 diabetes mellitus with hyperglycemia, without long-term current use of insulin ([facility]   7 months ago Type 2 diabetes mellitus with hyperglycemia, without long-term current use of insulin ([facility]   11 months ago Hypertension goal BP (blood pressure) < 130/80   [place]   1 year ago Hypertension goal BP (blood pressure) < 130/80   [place]   1 year ago Type 2 diabetes mellitus with hyperglycemia, without long-term current use of insulin ([facility]      Future Appointments            In 3 days Bacigalupo, [facility]ne [facility]D [facility]   In 9 months ThoPhilip AspenNM [facility]        . metFORMIN (GLUCOPHAGE-XR) 500 MG 24 hr tablet [Pharmacy Med Name: METFORMIN ER 500MG 24HR TABS] 120 tablet 0    Sig: TAKE 2 TABLETS(1000 MG) BY  MOUTH TWICE DAILY     Endocrinology:  Diabetes - Biguanides Failed - 06/07/2022  8:45 AM      Failed - B12 Level in normal range and within 720 days    No results found for: "VITAMINB12"       Failed - CBC within normal limits and completed in the last 12 months    WBC  Date Value Ref Range Status  09/07/2020 4.8 3.8 - 10.8 Thousand/uL Final   RBC  Date Value Ref Range Status  09/07/2020 3.98 3.80 - 5.10 Million/uL Final   Hemoglobin  Date Value Ref Range Status  09/07/2020 12.3 11.7 - 15.5 g/dL Final   HCT  Date Value Ref Range Status  09/07/2020 37.2 35.0 - 45.0 % Final   MCHC  Date Value Ref Range Status  09/07/2020 33.1 32.0 - 36.0 g/dL Final   [facility]  Date Value Ref Range Status  09/07/2020 30.9 27.0 - 33.0 pg Final   MCV  Date Value Ref Range Status  09/07/2020 93.5 80.0 - 100.0 fL Final   No results found for: "PLTCOUNTKUC", "LABPLAT",  "POCPLA" RDW  Date Value Ref Range Status  09/07/2020 13.4 11.0 - 15.0 % Final         Passed - Cr in normal range and within 360 days    Creat  Date Value Ref Range Status  02/06/2022 0.93 0.50 - 1.03 mg/dL Final   Creatinine, Urine  Date Value Ref Range Status  10/29/2021 98 20 - 275 mg/dL Final         Passed - HBA1C is between 0 and 7.9 and within 180 days    Hgb A1c MFr Bld  Date Value Ref Range Status  02/06/2022 7.7 (H) <5.7 % of total Hgb Final    Comment:    For someone without known diabetes, a hemoglobin A1c value of 6.5% or greater indicates that they may have  diabetes and this should be confirmed with a follow-up  test. . For someone with known diabetes, a value <7% indicates  that their diabetes is well controlled and a value  greater than or equal to 7% indicates suboptimal  control. A1c targets should be individualized based on  duration of diabetes, age, comorbid conditions, and  other considerations. . Currently, no consensus exists regarding use of hemoglobin A1c for diagnosis of diabetes for children. .          Passed - eGFR in normal range and within 360 days    GFR, Est African American  Date Value Ref Range Status  09/07/2020 96 > OR = 60 mL/min/1.73m Final   GFR, Est Non African American  Date Value Ref Range Status  09/07/2020 83 > OR = 60 mL/min/1.757mFinal   eGFR  Date Value Ref Range Status  02/06/2022 73 > OR = 60 mL/min/1.7332minal    Comment:    The eGFR is based on the CKD-EPI 2021 equation. To calculate  the new eGFR from a previous Creatinine or Cystatin C result, go to https://www.kidney.org/professionals/ kdoqi/gfr%5Fcalculator          Passed - Valid encounter within last 6 months    Recent Outpatient Visits          4 months ago Type 2 diabetes mellitus with hyperglycemia, without long-term current use of insulin ([facility]   7 months ago Type 2 diabetes  mellitus with hyperglycemia, without long-term current use of insulin ([place] [facility]  [facility] Rory Percy M, DO   11 months ago Hypertension goal BP (blood pressure) < 130/80   [place], [place]   1 year ago Hypertension goal BP (blood pressure) < 130/80   [place], DO   1 year ago Type 2 diabetes mellitus with hyperglycemia, without long-term current use of insulin [facility])   [place], DO      Future Appointments            In 3 days Bacigalupo, Dionne Bucy, MD [facility], [place]   In 9 months Philip Aspen, CNM [facility]           . Accu-Chek FastClix Lancets MISC [Pharmacy Med Name: ACCU-CHEK FASTCLIX LANCETS 102'S] 102 each 1    Sig: USE TO [place] DAILY     Endocrinology: Diabetes - Testing Supplies Passed - 06/07/2022  8:45 AM      Passed - Valid encounter within last 12 months    Recent Outpatient Visits          4 months ago Type 2 diabetes mellitus with hyperglycemia, without long-term current use of insulin [facility])   [facility]   7 months ago Type 2 diabetes mellitus with hyperglycemia, without long-term current use of insulin [facility])   [place], DO   11 months ago Hypertension goal BP (blood pressure) < 130/80   [place], DO   1 year ago Hypertension goal BP (blood pressure) < 130/80   [place], DO   1 year ago Type 2 diabetes mellitus with hyperglycemia, without long-term current use of insulin [facility])   [place], [place], DO      Future Appointments            In 3 days Bacigalupo, Dionne Bucy, MD [facility], [place]   In 9 months Philip Aspen, CNM [facility]

## 2022-06-10 ENCOUNTER — Other Ambulatory Visit: Payer: Self-pay | Admitting: *Deleted

## 2022-06-10 ENCOUNTER — Encounter: Payer: Self-pay | Admitting: Family Medicine

## 2022-06-10 ENCOUNTER — Telehealth (INDEPENDENT_AMBULATORY_CARE_PROVIDER_SITE_OTHER): Payer: Medicare Other | Admitting: Family Medicine

## 2022-06-10 VITALS — BP 122/74 | Ht 67.0 in | Wt 224.0 lb

## 2022-06-10 DIAGNOSIS — J310 Chronic rhinitis: Secondary | ICD-10-CM

## 2022-06-10 DIAGNOSIS — E785 Hyperlipidemia, unspecified: Secondary | ICD-10-CM

## 2022-06-10 DIAGNOSIS — M05741 Rheumatoid arthritis with rheumatoid factor of right hand without organ or systems involvement: Secondary | ICD-10-CM

## 2022-06-10 DIAGNOSIS — I152 Hypertension secondary to endocrine disorders: Secondary | ICD-10-CM

## 2022-06-10 DIAGNOSIS — E1169 Type 2 diabetes mellitus with other specified complication: Secondary | ICD-10-CM

## 2022-06-10 DIAGNOSIS — E1165 Type 2 diabetes mellitus with hyperglycemia: Secondary | ICD-10-CM

## 2022-06-10 DIAGNOSIS — M05742 Rheumatoid arthritis with rheumatoid factor of left hand without organ or systems involvement: Secondary | ICD-10-CM

## 2022-06-10 DIAGNOSIS — E1159 Type 2 diabetes mellitus with other circulatory complications: Secondary | ICD-10-CM | POA: Diagnosis not present

## 2022-06-10 DIAGNOSIS — F5101 Primary insomnia: Secondary | ICD-10-CM

## 2022-06-10 DIAGNOSIS — G894 Chronic pain syndrome: Secondary | ICD-10-CM

## 2022-06-10 DIAGNOSIS — Z9049 Acquired absence of other specified parts of digestive tract: Secondary | ICD-10-CM

## 2022-06-10 DIAGNOSIS — D849 Immunodeficiency, unspecified: Secondary | ICD-10-CM

## 2022-06-10 DIAGNOSIS — K581 Irritable bowel syndrome with constipation: Secondary | ICD-10-CM

## 2022-06-10 DIAGNOSIS — K219 Gastro-esophageal reflux disease without esophagitis: Secondary | ICD-10-CM

## 2022-06-10 DIAGNOSIS — F319 Bipolar disorder, unspecified: Secondary | ICD-10-CM

## 2022-06-10 MED ORDER — TIZANIDINE HCL 4 MG PO TABS: 4.0000 mg | ORAL_TABLET | Freq: Every evening | ORAL | 5 refills | Status: AC | PRN

## 2022-06-10 NOTE — Assessment & Plan Note (Signed)
BMI >35 and associated with T2DM, HTN, HLD Discussed importance of healthy weight management Discussed diet and exercise  
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
## 2022-06-10 NOTE — Assessment & Plan Note (Signed)
Chronic and stable Continue astelin and flonase F/b ENT - DR Richardson Landry

## 2022-06-10 NOTE — Assessment & Plan Note (Signed)
History of partial sigmoid colectomy due to recurrent diverticulitis and polyps Stable

## 2022-06-10 NOTE — Assessment & Plan Note (Signed)
Chronic and stable Followed by rheumatology at [facility] Continue methotrexate and folic acid

## 2022-06-10 NOTE — Assessment & Plan Note (Signed)
Previously well controlled Continue statin Repeat FLP and CMP at least annually Goal LDL < 70

## 2022-06-10 NOTE — Assessment & Plan Note (Signed)
History of bipolar 1 disorder with history of mania No depression or mania for many years She is not currently on a mood stabilizer Continue therapy

## 2022-06-10 NOTE — Telephone Encounter (Signed)
Patient aware.

## 2022-06-10 NOTE — Assessment & Plan Note (Signed)
Stable On methotrexate

## 2022-06-10 NOTE — Assessment & Plan Note (Signed)
Chronic and well-controlled Continue melatonin

## 2022-06-10 NOTE — Assessment & Plan Note (Signed)
Chronic and uncontrolled With hyperglycemia with last A1c 7.7 She has had a recent ESI, which contributed to recent hyperglycemia She is trying to watch what she eats Recheck A1c later this week Continue metformin and Jardiance at current dose Up-to-date on eye exam, foot exam, UACR, vaccinations Follow-up in 3 months

## 2022-06-10 NOTE — Assessment & Plan Note (Signed)
Stable and well controlled on linzess - continue

## 2022-06-10 NOTE — Assessment & Plan Note (Signed)
Chronic and well-controlled Not currently on H2 blocker or PPI Continue Rolaids as needed

## 2022-06-10 NOTE — Assessment & Plan Note (Signed)
Stable Never on meds - lifestyle interventions/DASH diet Reviewed recent metabolic panel F/u in 3 m

## 2022-06-13 ENCOUNTER — Ambulatory Visit (INDEPENDENT_AMBULATORY_CARE_PROVIDER_SITE_OTHER): Payer: Medicare Other | Admitting: Family Medicine

## 2022-06-13 ENCOUNTER — Encounter: Payer: Self-pay | Admitting: Family Medicine

## 2022-06-13 DIAGNOSIS — E1165 Type 2 diabetes mellitus with hyperglycemia: Secondary | ICD-10-CM

## 2022-06-13 LAB — POCT GLYCOSYLATED HEMOGLOBIN (HGB A1C)
Est. average glucose Bld gHb Est-mCnc: 177
Hemoglobin A1C: 7.8 % — AB (ref 4.0–5.6)

## 2022-06-13 NOTE — Progress Notes (Signed)
Patient is here to have A1C checked.  A1C higher at 7.7 Increased jardiance to 25 mg daily. Continue to work on lifestyle changes and decrease sugars and carbs in diet.

## 2022-06-13 NOTE — Progress Notes (Signed)
Patient not seen by NP; seen by CMA for A1c.  Gwyneth Sprout, [address] [place], [place] [address] [phone] (phone) [phone] (fax) [place]

## 2022-06-13 NOTE — Progress Notes (Signed)
Seen in office, aware of results. Continue to recommend balanced, lower carb meals. Smaller meal size, adding snacks. Choosing water as drink of choice and increasing purposeful exercise. A1c has increased; however, remains stable <8%

## 2022-06-19 ENCOUNTER — Ambulatory Visit
Payer: Medicare Other | Attending: Student in an Organized Health Care Education/Training Program | Admitting: Student in an Organized Health Care Education/Training Program

## 2022-06-19 DIAGNOSIS — M5416 Radiculopathy, lumbar region: Secondary | ICD-10-CM | POA: Diagnosis not present

## 2022-06-19 DIAGNOSIS — G894 Chronic pain syndrome: Secondary | ICD-10-CM | POA: Diagnosis not present

## 2022-06-19 DIAGNOSIS — G8929 Other chronic pain: Secondary | ICD-10-CM

## 2022-06-19 DIAGNOSIS — M48062 Spinal stenosis, lumbar region with neurogenic claudication: Secondary | ICD-10-CM

## 2022-06-19 NOTE — Progress Notes (Signed)
Patient: Elizabeth [name]  Service Category: E/M  Provider: Gillis Santa, MD  DOB: [birth year]  DOS: 06/19/2022  Location: Office  MRN: [id number]  Setting: Ambulatory outpatient  Referring Provider: Teodora Medici, DO  Type: Established Patient  Specialty: Interventional Pain Management  PCP: Virginia Crews, MD  Location: Remote location  Delivery: TeleHealth     Virtual Encounter - Pain Management PROVIDER NOTE: Information contained herein reflects review and annotations entered in association with encounter. Interpretation of such information and data should be left to medically-trained personnel. Information provided to patient can be located elsewhere in the medical record under "Patient Instructions". Document created using STT-dictation technology, any transcriptional errors that may result from process are unintentional.    Contact & Pharmacy Preferred: [phone] Home: [phone] (home) Mobile: [phone] (mobile) E-mail: [email or web address] [place], [place] AT [facility] [address] [place] [address] Phone: [phone] Fax: [phone]   Pre-screening  Ms. Elizabeth [name] offered "in-person" vs "virtual" encounter. She indicated preferring virtual for this encounter.   Reason COVID-19*  Social distancing based on CDC and AMA recommendations.   I contacted Elizabeth [name] on 06/19/2022 via telephone.      I clearly identified myself as Gillis Santa, MD. I verified that I was speaking with the correct person using two identifiers (Name: Elizabeth [name], and date of birth: 10/21/1966).  Consent I sought verbal advanced consent from Elizabeth [name] for virtual visit interactions. I informed Elizabeth [name] of possible security and privacy concerns, risks, and limitations associated with providing "not-in-person" medical evaluation and management services. I also informed Elizabeth [name] of the availability of "in-person"  appointments. Finally, I informed her that there would be a charge for the virtual visit and that she could be  personally, fully or partially, financially responsible for it. Elizabeth [name] expressed understanding and agreed to proceed.   Historic Elements   Elizabeth [name] is a 56 y.o. year old, female patient evaluated today after our last contact on 05/31/2022. Elizabeth [name]  has a past medical history of Bipolar 1 disorder ([place]), Depression, Diabetes mellitus without complication ([place]), diverticulitis, GERD (gastroesophageal reflux disease), Heart murmur, Hyperlipidemia, Hypertension goal BP (blood pressure) < 130/80 (01/17/2016), Insomnia, Irritable bowel syndrome, Motion sickness, Obesity (BMI 30-39.9) (07/03/2015), RA (rheumatoid arthritis) ([place]), and Sciatica. She also  has a past surgical history that includes Tubal ligation; Esophagogastroduodenoscopy (egd) with esophageal dilation; Cholecystectomy (1997); Colonoscopy with propofol (N/A, 09/07/2015); Esophagogastroduodenoscopy (egd) with propofol (N/A, 09/07/2015); polypectomy (N/A, 09/07/2015); Partial colectomy (N/A, 01/24/2016); Bowel resection; Colonoscopy with propofol (N/A, 05/16/2020); Breast biopsy (Right, 12/22/2019); and Breast biopsy. Elizabeth [name] has a current medication list which includes the following prescription(s): accu-chek fastclix lancets, ammonium lactate, aspirin, atorvastatin, azelastine, b-d ins syr ultrafine 1cc/31g, accu-chek guide, cholecalciferol, clobetasol ointment, ferrous sulfate, fluticasone, folic acid, accu-chek guide, jardiance, linzess, melatonin, metformin, methotrexate (pf), multiple vitamins-minerals, omega-3 fatty acids, OVER THE COUNTER MEDICATION, tizanidine, vitamin a, vitamin b-12, and zinc. She  reports that she quit smoking about 7 years ago. Her smoking use included cigarettes. She has a 1.00 pack-year smoking history. She has never used smokeless tobacco. She reports current alcohol use. She reports that she does  not use drugs. Elizabeth [name] is allergic to ace inhibitors, enalapril, penicillins, and statins.   HPI  Today, she is being contacted for a post-procedure assessment.   Post-procedure evaluation   Type: Therapeutic Inter-Laminar Epidural Steroid Injection  #1 for 2023 Region: Lumbar Level: L4-5  Level. Laterality: Left-Sided         Effectiveness:  Initial hour after procedure: 100 %  Subsequent 4-6 hours post-procedure: 100 %  Analgesia past initial 6 hours: 50 % (ongoing)  Ongoing improvement:  Analgesic: 50% however patient feels that pain is returning Function: Elizabeth [name] reports improvement in function ROM: Back to baseline   Laboratory Chemistry Profile   Renal Lab Results  Component Value Date   BUN 11 02/06/2022   CREATININE 0.93 02/06/2022   LABCREA 98 81/19/1478   BCR NOT APPLICABLE 29/56/2130   GFRAA 96 09/07/2020   GFRNONAA 83 09/07/2020    Hepatic Lab Results  Component Value Date   AST 21 02/06/2022   ALT 30 (H) 02/06/2022   ALBUMIN 4.1 04/01/2017   ALKPHOS 102 04/01/2017   LIPASE 13 09/07/2020    Electrolytes Lab Results  Component Value Date   NA 143 02/06/2022   K 4.6 02/06/2022   CL 109 02/06/2022   CALCIUM 9.4 02/06/2022    Bone Lab Results  Component Value Date   VD25OH 34 07/02/2017    Inflammation (CRP: Acute Phase) (ESR: Chronic Phase) No results found for: "CRP", "ESRSEDRATE", "LATICACIDVEN"       Note: Above Lab results reviewed.  Assessment  The primary encounter diagnosis was Chronic radicular lumbar pain. Diagnoses of Spinal stenosis, lumbar region, with neurogenic claudication and Chronic pain syndrome were also pertinent to this visit.  Plan of Care  Recommend repeating left L4-L5 ESI with p.o. Valium given moderate analgesic and functional response from previous injection done May 15, 2022.  Risk and benefits reviewed and patient like to proceed.   Orders:  Orders Placed This Encounter  Procedures   Lumbar Epidural  Injection    Standing Status:   Future    Standing Expiration Date:   07/20/2022    Scheduling Instructions:     Left L4-L5 ESI #2, p.o. Valium    Order Specific Question:   Where will this procedure be performed?    Answer:   [facility]   Follow-up plan:   Return in about 2 weeks (around 07/03/2022) for Left L4-L5 ESI, in clinic (PO Valium).     L4/L5 ESI 06/04/21, 05/15/22 6 cc injected   Recent Visits Date Type Provider Dept  05/15/22 Procedure visit Gillis Santa, MD [facility]  04/30/22 Office Visit Gillis Santa, MD [facility]  Showing recent visits within past 90 days and meeting all other requirements Today's Visits Date Type Provider Dept  06/19/22 Office Visit Gillis Santa, MD [facility]  Showing today's visits and meeting all other requirements Future Appointments No visits were found meeting these conditions. Showing future appointments within next 90 days and meeting all other requirements  I discussed the assessment and treatment plan with the patient. The patient was provided an opportunity to ask questions and all were answered. The patient agreed with the plan and demonstrated an understanding of the instructions.  Patient advised to call back or seek an in-person evaluation if the symptoms or condition worsens.  Duration of encounter: 30mnutes.  Note by: BGillis Santa MD Date: 06/19/2022; Time: 2:49 PM

## 2022-06-24 ENCOUNTER — Other Ambulatory Visit: Payer: Self-pay | Admitting: Internal Medicine

## 2022-06-24 DIAGNOSIS — E1165 Type 2 diabetes mellitus with hyperglycemia: Secondary | ICD-10-CM

## 2022-06-25 NOTE — Telephone Encounter (Signed)
Requested Prescriptions  Pending Prescriptions Disp Refills  . metFORMIN (GLUCOPHAGE-XR) 500 MG 24 hr tablet [Pharmacy Med Name: METFORMIN ER 500MG 24HR TABS] 120 tablet 2    Sig: TAKE 2 TABLETS(1000 MG) BY MOUTH TWICE DAILY     Endocrinology:  Diabetes - Biguanides Failed - 06/24/2022  3:24 PM      Failed - B12 Level in normal range and within 720 days    No results found for: "VITAMINB12"       Failed - CBC within normal limits and completed in the last 12 months    WBC  Date Value Ref Range Status  09/07/2020 4.8 3.8 - 10.8 Thousand/uL Final   RBC  Date Value Ref Range Status  09/07/2020 3.98 3.80 - 5.10 Million/uL Final   Hemoglobin  Date Value Ref Range Status  09/07/2020 12.3 11.7 - 15.5 g/dL Final   HCT  Date Value Ref Range Status  09/07/2020 37.2 35.0 - 45.0 % Final   MCHC  Date Value Ref Range Status  09/07/2020 33.1 32.0 - 36.0 g/dL Final   [facility]  Date Value Ref Range Status  09/07/2020 30.9 27.0 - 33.0 pg Final   MCV  Date Value Ref Range Status  09/07/2020 93.5 80.0 - 100.0 fL Final   No results found for: "PLTCOUNTKUC", "LABPLAT", "POCPLA" RDW  Date Value Ref Range Status  09/07/2020 13.4 11.0 - 15.0 % Final         Passed - Cr in normal range and within 360 days    Creat  Date Value Ref Range Status  02/06/2022 0.93 0.50 - 1.03 mg/dL Final   Creatinine, Urine  Date Value Ref Range Status  10/29/2021 98 20 - 275 mg/dL Final         Passed - HBA1C is between 0 and 7.9 and within 180 days    Hemoglobin A1C  Date Value Ref Range Status  06/13/2022 7.8 (A) 4.0 - 5.6 % Final   Hgb A1c MFr Bld  Date Value Ref Range Status  02/06/2022 7.7 (H) <5.7 % of total Hgb Final    Comment:    For someone without known diabetes, a hemoglobin A1c value of 6.5% or greater indicates that they may have  diabetes and this should be confirmed with a follow-up  test. . For someone with known diabetes, a value <7% indicates  that their diabetes is well  controlled and a value  greater than or equal to 7% indicates suboptimal  control. A1c targets should be individualized based on  duration of diabetes, age, comorbid conditions, and  other considerations. . Currently, no consensus exists regarding use of hemoglobin A1c for diagnosis of diabetes for children. .          Passed - eGFR in normal range and within 360 days    GFR, Est African American  Date Value Ref Range Status  09/07/2020 96 > OR = 60 mL/min/1.33m Final   GFR, Est Non African American  Date Value Ref Range Status  09/07/2020 83 > OR = 60 mL/min/1.722mFinal   eGFR  Date Value Ref Range Status  02/06/2022 73 > OR = 60 mL/min/1.7372minal    Comment:    The eGFR is based on the CKD-EPI 2021 equation. To calculate  the new eGFR from a previous Creatinine or Cystatin C result, go to https://www.kidney.org/professionals/ kdoqi/gfr%5Fcalculator          Passed - Valid encounter within last 6 months    Recent Outpatient Visits  2 weeks ago Hypertension associated with diabetes [facility])   [facility], Dionne Bucy, MD   4 months ago Type 2 diabetes mellitus with hyperglycemia, without long-term current use of insulin [facility])   [facility] Teodora Medici, DO   7 months ago Type 2 diabetes mellitus with hyperglycemia, without long-term current use of insulin [facility])   [place], DO   11 months ago Hypertension goal BP (blood pressure) < 130/80   [place], DO   1 year ago Hypertension goal BP (blood pressure) < 130/80   [place], DO      Future Appointments            In 2 months Bacigalupo, Dionne Bucy, MD [facility] [place]   In 9 months Philip Aspen, CNM [facility]

## 2022-07-08 ENCOUNTER — Encounter: Payer: Self-pay | Admitting: Student in an Organized Health Care Education/Training Program

## 2022-07-08 ENCOUNTER — Ambulatory Visit
Admission: RE | Admit: 2022-07-08 | Discharge: 2022-07-08 | Disposition: A | Discharge status: Home / Self Care | Payer: Medicare Other | Source: Ambulatory Visit | Attending: Student in an Organized Health Care Education/Training Program | Admitting: Student in an Organized Health Care Education/Training Program

## 2022-07-08 ENCOUNTER — Ambulatory Visit
Payer: Medicare Other | Attending: Student in an Organized Health Care Education/Training Program | Admitting: Student in an Organized Health Care Education/Training Program

## 2022-07-08 DIAGNOSIS — M48062 Spinal stenosis, lumbar region with neurogenic claudication: Secondary | ICD-10-CM | POA: Diagnosis present

## 2022-07-08 DIAGNOSIS — M5416 Radiculopathy, lumbar region: Secondary | ICD-10-CM | POA: Insufficient documentation

## 2022-07-08 DIAGNOSIS — G894 Chronic pain syndrome: Secondary | ICD-10-CM | POA: Insufficient documentation

## 2022-07-08 DIAGNOSIS — G8929 Other chronic pain: Secondary | ICD-10-CM | POA: Insufficient documentation

## 2022-07-08 MED ORDER — ROPIVACAINE HCL 2 MG/ML IJ SOLN
2.0000 mL | Freq: Once | INTRAMUSCULAR | Status: AC
  Administered 2022-07-08: 2 mL via EPIDURAL

## 2022-07-08 MED ORDER — DEXAMETHASONE SODIUM PHOSPHATE 10 MG/ML IJ SOLN
10.0000 mg | Freq: Once | INTRAMUSCULAR | Status: AC
  Administered 2022-07-08: 10 mg

## 2022-07-08 MED ORDER — IOHEXOL 180 MG/ML  SOLN
10.0000 mL | Freq: Once | INTRAMUSCULAR | Status: AC
  Administered 2022-07-08: 10 mL via EPIDURAL

## 2022-07-08 MED ORDER — SODIUM CHLORIDE 0.9% FLUSH
2.0000 mL | Freq: Once | INTRAVENOUS | Status: AC
  Administered 2022-07-08: 2 mL

## 2022-07-08 MED ORDER — DIAZEPAM 5 MG PO TABS
5.0000 mg | ORAL_TABLET | ORAL | Status: AC
  Administered 2022-07-08: 5 mg via ORAL

## 2022-07-08 MED ORDER — LIDOCAINE HCL 2 % IJ SOLN
20.0000 mL | Freq: Once | INTRAMUSCULAR | Status: AC
  Administered 2022-07-08: 400 mg

## 2022-07-08 NOTE — Progress Notes (Signed)
PROVIDER NOTE: Information contained herein reflects review and annotations entered in association with encounter. Interpretation of such information and data should be left to medically-trained personnel. Information provided to patient can be located elsewhere in the medical record under "Patient Instructions". Document created using STT-dictation technology, any transcriptional errors that may result from process are unintentional.    Patient: Elizabeth [name]  Service Category: Procedure  Provider: Gillis Santa, MD  DOB: December 27, 1965  DOS: 07/08/2022  Location: [facility]  MRN: [id number]  Setting: Ambulatory - outpatient  Referring Provider: Gillis Santa, MD  Type: Established Patient  Specialty: Interventional Pain Management  PCP: Virginia Crews, MD   Primary Reason for Visit: Interventional Pain Management Treatment. CC: Back Pain   Procedure:          Anesthesia, Analgesia, Anxiolysis:  Type: Therapeutic Inter-Laminar Epidural Steroid Injection  #2  Region: Lumbar Level: L4-5 Level. Laterality: Left-Sided         Type: Local Anesthesia with PO Valium  Local Anesthetic: Lidocaine 1-2%  Position: Prone with head of the table was raised to facilitate breathing.   Indications: 1. Chronic radicular lumbar pain   2. Spinal stenosis, lumbar region, with neurogenic claudication   3. Chronic pain syndrome    Pain Score: Pre-procedure: 6 /10 Post-procedure: 0-No pain/10   Pre-op H&P Assessment:  Elizabeth [name] is a 56 y.o. (year old), female patient, seen today for interventional treatment. She  has a past surgical history that includes Tubal ligation; Esophagogastroduodenoscopy (egd) with esophageal dilation; Cholecystectomy (1997); Colonoscopy with propofol (N/A, 09/07/2015); Esophagogastroduodenoscopy (egd) with propofol (N/A, 09/07/2015); polypectomy (N/A, 09/07/2015); Partial colectomy (N/A, 01/24/2016); Bowel resection; Colonoscopy with propofol (N/A, 05/16/2020); Breast  biopsy (Right, 12/22/2019); and Breast biopsy. Elizabeth [name] has a current medication list which includes the following prescription(s): accu-chek fastclix lancets, ammonium lactate, aspirin, atorvastatin, azelastine, b-d ins syr ultrafine 1cc/31g, accu-chek guide, cholecalciferol, clobetasol ointment, ferrous sulfate, fluticasone, folic acid, accu-chek guide, jardiance, linzess, melatonin, metformin, methotrexate (pf), multiple vitamins-minerals, omega-3 fatty acids, OVER THE COUNTER MEDICATION, tizanidine, vitamin a, cyanocobalamin, and zinc. Her primarily concern today is the Back Pain   Initial Vital Signs:  Pulse/HCG Rate: 81ECG Heart Rate: 83 Temp: (!) 97.2 F (36.2 C) Resp: 16 BP: 130/75 SpO2: 99 %  BMI: Estimated body mass index is 34.61 kg/m as calculated from the following:   Height as of this encounter: '5\' 7"'$  (1.702 m).   Weight as of this encounter: 221 lb (100.2 kg).  Risk Assessment: Allergies: Reviewed. She is allergic to ace inhibitors, enalapril, penicillins, and statins.  Allergy Precautions: None required Coagulopathies: Reviewed. None identified.  Blood-thinner therapy: None at this time Active Infection(s): Reviewed. None identified. Elizabeth [name] is afebrile  Site Confirmation: Elizabeth [name] was asked to confirm the procedure and laterality before marking the site Procedure checklist: Completed Consent: Before the procedure and under the influence of no sedative(s), amnesic(s), or anxiolytics, the patient was informed of the treatment options, risks and possible complications. To fulfill our ethical and legal obligations, as recommended by the American Medical Association's Code of Ethics, I have informed the patient of my clinical impression; the nature and purpose of the treatment or procedure; the risks, benefits, and possible complications of the intervention; the alternatives, including doing nothing; the risk(s) and benefit(s) of the alternative treatment(s) or  procedure(s); and the risk(s) and benefit(s) of doing nothing. The patient was provided information about the general risks and possible complications associated with the procedure. These may include, but are not  limited to: failure to achieve desired goals, infection, bleeding, organ or nerve damage, allergic reactions, paralysis, and death. In addition, the patient was informed of those risks and complications associated to Spine-related procedures, such as failure to decrease pain; infection (i.e.: Meningitis, epidural or intraspinal abscess); bleeding (i.e.: epidural hematoma, subarachnoid hemorrhage, or any other type of intraspinal or peri-dural bleeding); organ or nerve damage (i.e.: Any type of peripheral nerve, nerve root, or spinal cord injury) with subsequent damage to sensory, motor, and/or autonomic systems, resulting in permanent pain, numbness, and/or weakness of one or several areas of the body; allergic reactions; (i.e.: anaphylactic reaction); and/or death. Furthermore, the patient was informed of those risks and complications associated with the medications. These include, but are not limited to: allergic reactions (i.e.: anaphylactic or anaphylactoid reaction(s)); adrenal axis suppression; blood sugar elevation that in diabetics may result in ketoacidosis or comma; water retention that in patients with history of congestive heart failure may result in shortness of breath, pulmonary edema, and decompensation with resultant heart failure; weight gain; swelling or edema; medication-induced neural toxicity; particulate matter embolism and blood vessel occlusion with resultant organ, and/or nervous system infarction; and/or aseptic necrosis of one or more joints. Finally, the patient was informed that Medicine is not an exact science; therefore, there is also the possibility of unforeseen or unpredictable risks and/or possible complications that may result in a catastrophic outcome. The patient  indicated having understood very clearly. We have given the patient no guarantees and we have made no promises. Enough time was given to the patient to ask questions, all of which were answered to the patient's satisfaction. Ms. Whittlesey has indicated that she wanted to continue with the procedure. Attestation: I, the ordering provider, attest that I have discussed with the patient the benefits, risks, side-effects, alternatives, likelihood of achieving goals, and potential problems during recovery for the procedure that I have provided informed consent. Date  Time: 07/08/2022  9:45 AM  Pre-Procedure Preparation:  Monitoring: As per clinic protocol. Respiration, ETCO2, SpO2, BP, heart rate and rhythm monitor placed and checked for adequate function Safety Precautions: Patient was assessed for positional comfort and pressure points before starting the procedure. Time-out: I initiated and conducted the "Time-out" before starting the procedure, as per protocol. The patient was asked to participate by confirming the accuracy of the "Time Out" information. Verification of the correct person, site, and procedure were performed and confirmed by me, the nursing staff, and the patient. "Time-out" conducted as per Joint Commission's Universal Protocol (UP.01.01.01). Time: 1006  Description of Procedure:          Target Area: The interlaminar space, initially targeting the lower laminar border of the superior vertebral body. Approach: Paramedial approach. Area Prepped: Entire Posterior Lumbar Region DuraPrep (Iodine Povacrylex [0.7% available iodine] and Isopropyl Alcohol, 74% w/w) Safety Precautions: Aspiration looking for blood return was conducted prior to all injections. At no point did we inject any substances, as a needle was being advanced. No attempts were made at seeking any paresthesias. Safe injection practices and needle disposal techniques used. Medications properly checked for expiration dates. SDV  (single dose vial) medications used. Description of the Procedure: Protocol guidelines were followed. The procedure needle was introduced through the skin, ipsilateral to the reported pain, and advanced to the target area. Bone was contacted and the needle walked caudad, until the lamina was cleared. The epidural space was identified using "loss-of-resistance technique" with 2-3 ml of PF-NaCl (0.9% NSS), in a 5cc LOR glass syringe.  Vitals:   07/08/22 0947 07/08/22 1004 07/08/22 1008 07/08/22 1012  BP: 130/75 (!) 149/98 (!) 142/96 (!) 137/99  Pulse: 81     Resp: '16 18 19 16  '$ Temp: (!) 97.2 F (36.2 C)     TempSrc: Temporal     SpO2: 99% 96% 96% 95%  Weight: 221 lb (100.2 kg)     Height: '5\' 7"'$  (1.702 m)         Start Time: 1006 hrs. End Time: 1011 hrs.  Materials:  Needle(s) Type: Epidural needle Gauge: 22G Length: 3.5-in Medication(s): Please see orders for medications and dosing details. 6 cc solution made of 3 cc of preservative-free saline, 2 cc of 0.2% ropivacaine, 1 cc of Decadron 10 mg/cc.  Imaging Guidance (Spinal):          Type of Imaging Technique: Fluoroscopy Guidance (Spinal) Indication(s): Assistance in needle guidance and placement for procedures requiring needle placement in or near specific anatomical locations not easily accessible without such assistance. Exposure Time: Please see nurses notes. Contrast: Before injecting any contrast, we confirmed that the patient did not have an allergy to iodine, shellfish, or radiological contrast. Once satisfactory needle placement was completed at the desired level, radiological contrast was injected. Contrast injected under live fluoroscopy. No contrast complications. See chart for type and volume of contrast used. Fluoroscopic Guidance: I was personally present during the use of fluoroscopy. "Tunnel Vision Technique" used to obtain the best possible view of the target area. Parallax error corrected before commencing the  procedure. "Direction-depth-direction" technique used to introduce the needle under continuous pulsed fluoroscopy. Once target was reached, antero-posterior, oblique, and lateral fluoroscopic projection used confirm needle placement in all planes. Images permanently stored in EMR. Interpretation: I personally interpreted the imaging intraoperatively. Adequate needle placement confirmed in multiple planes. Appropriate spread of contrast into desired area was observed. No evidence of afferent or efferent intravascular uptake. No intrathecal or subarachnoid spread observed. Permanent images saved into the patient's record.   Post-operative Assessment:  Post-procedure Vital Signs:  Pulse/HCG Rate: 8180 Temp: (!) 97.2 F (36.2 C) Resp: 16 BP: (!) 137/99 SpO2: 95 %  EBL: None  Complications: No immediate post-treatment complications observed by team, or reported by patient.  Note: The patient tolerated the entire procedure well. A repeat set of vitals were taken after the procedure and the patient was kept under observation following institutional policy, for this type of procedure. Post-procedural neurological assessment was performed, showing return to baseline, prior to discharge. The patient was provided with post-procedure discharge instructions, including a section on how to identify potential problems. Should any problems arise concerning this procedure, the patient was given instructions to immediately contact us, at any time, without hesitation. In any case, we plan to contact the patient by telephone for a follow-up status report regarding this interventional procedure.  Comments:  No additional relevant information.   5 out of 5 strength bilateral lower extremity: Plantar flexion, dorsiflexion, knee flexion, knee extension.  Plan of Care  Orders:  Orders Placed This Encounter  Procedures   DG PAIN CLINIC C-ARM 1-60 MIN NO REPORT    Intraoperative interpretation by procedural  physician at [place].    Standing Status:   Standing    Number of Occurrences:   1    Order Specific Question:   Reason for exam:    Answer:   Assistance in needle guidance and placement for procedures requiring needle placement in or near specific anatomical locations not easily accessible without such  assistance.    Medications ordered for procedure: Meds ordered this encounter  Medications   iohexol (OMNIPAQUE) 180 MG/ML injection 10 mL    Must be Myelogram-compatible. If not available, you may substitute with a water-soluble, non-ionic, hypoallergenic, myelogram-compatible radiological contrast medium.   lidocaine (XYLOCAINE) 2 % (with pres) injection 400 mg   diazepam (VALIUM) tablet 5 mg    Make sure Flumazenil is available in the pyxis when using this medication. If oversedation occurs, administer 0.2 mg IV over 15 sec. If after 45 sec no response, administer 0.2 mg again over 1 min; may repeat at 1 min intervals; not to exceed 4 doses (1 mg)   ropivacaine (PF) 2 mg/mL (0.2%) (NAROPIN) injection 2 mL   sodium chloride flush (NS) 0.9 % injection 2 mL   dexamethasone (DECADRON) injection 10 mg   Medications administered: We administered iohexol, lidocaine, diazepam, ropivacaine (PF) 2 mg/mL (0.2%), sodium chloride flush, and dexamethasone.  See the medical record for exact dosing, route, and time of administration.  Follow-up plan:   Return in about 6 weeks (around 08/19/2022) for Post Procedure Evaluation, virtual.      L4/L5 ESI 06/04/21, 05/15/22 6 cc injected, 07/08/22 6 cc  Recent Visits Date Type Provider Dept  06/19/22 Office Visit Gillis Santa, MD [facility]  05/15/22 Procedure visit Gillis Santa, MD [facility]  04/30/22 Office Visit Gillis Santa, MD [facility]  Showing recent visits within past 90 days and meeting all other requirements Today's Visits Date Type Provider Dept  07/08/22 Procedure visit Gillis Santa, MD  [facility]  Showing today's visits and meeting all other requirements Future Appointments Date Type Provider Dept  08/19/22 Appointment Gillis Santa, MD [facility]  Showing future appointments within next 90 days and meeting all other requirements  Disposition: Discharge home  Discharge (Date  Time): 07/08/2022; 1016 hrs.   Primary Care Physician: Virginia Crews, MD Location: [facility] Note by: Gillis Santa, MD Date: 07/08/2022; Time: 10:40 AM  Disclaimer:  Medicine is not an exact science. The only guarantee in medicine is that nothing is guaranteed. It is important to note that the decision to proceed with this intervention was based on the information collected from the patient. The Data and conclusions were drawn from the patient's questionnaire, the interview, and the physical examination. Because the information was provided in large part by the patient, it cannot be guaranteed that it has not been purposely or unconsciously manipulated. Every effort has been made to obtain as much relevant data as possible for this evaluation. It is important to note that the conclusions that lead to this procedure are derived in large part from the available data. Always take into account that the treatment will also be dependent on availability of resources and existing treatment guidelines, considered by other Pain Management Practitioners as being common knowledge and practice, at the time of the intervention. For Medico-Legal purposes, it is also important to point out that variation in procedural techniques and pharmacological choices are the acceptable norm. The indications, contraindications, technique, and results of the above procedure should only be interpreted and judged by a Board-Certified Interventional Pain Specialist with extensive familiarity and expertise in the same exact procedure and technique.

## 2022-07-08 NOTE — Progress Notes (Signed)
Safety precautions to be maintained throughout the outpatient stay will include: orient to surroundings, keep bed in low position, maintain call bell within reach at all times, provide assistance with transfer out of bed and ambulation.  

## 2022-07-08 NOTE — Patient Instructions (Signed)

## 2022-07-09 ENCOUNTER — Telehealth: Payer: Self-pay

## 2022-07-09 NOTE — Telephone Encounter (Signed)
Post procedure phone call.  Patient states she is wonderful.

## 2022-08-02 ENCOUNTER — Other Ambulatory Visit: Payer: Self-pay | Admitting: Internal Medicine

## 2022-08-02 NOTE — Telephone Encounter (Signed)
Requested Prescriptions  Pending Prescriptions Disp Refills  . LINZESS 290 MCG CAPS capsule [Pharmacy Med Name: Rolan Lipa 290MCG CAPSULES] 90 capsule 0    Sig: TAKE 1 CAPSULE(290 MCG) BY MOUTH DAILY     Gastroenterology: Irritable Bowel Syndrome Passed - 08/02/2022  8:35 AM      Passed - Valid encounter within last 12 months    Recent Outpatient Visits          1 month ago Hypertension associated with diabetes [facility])   [facility] [place], Dionne Bucy, MD   5 months ago Type 2 diabetes mellitus with hyperglycemia, without long-term current use of insulin [facility])   [facility] Teodora Medici, DO   9 months ago Type 2 diabetes mellitus with hyperglycemia, without long-term current use of insulin [facility])   [place], DO   1 year ago Hypertension goal BP (blood pressure) < 130/80   [place], DO   1 year ago Hypertension goal BP (blood pressure) < 130/80   [place], DO      Future Appointments            In 1 month Bacigalupo, Dionne Bucy, MD [facility] [place]   In 8 months Philip Aspen, CNM [facility]

## 2022-08-14 LAB — HEMOGLOBIN A1C: Hemoglobin A1C: 7.1

## 2022-08-19 ENCOUNTER — Ambulatory Visit
Payer: Medicare Other | Attending: Student in an Organized Health Care Education/Training Program | Admitting: Student in an Organized Health Care Education/Training Program

## 2022-08-19 ENCOUNTER — Encounter: Payer: Self-pay | Admitting: Student in an Organized Health Care Education/Training Program

## 2022-08-19 DIAGNOSIS — G8929 Other chronic pain: Secondary | ICD-10-CM

## 2022-08-19 DIAGNOSIS — M5416 Radiculopathy, lumbar region: Secondary | ICD-10-CM | POA: Diagnosis not present

## 2022-08-19 DIAGNOSIS — G894 Chronic pain syndrome: Secondary | ICD-10-CM

## 2022-08-19 DIAGNOSIS — M48062 Spinal stenosis, lumbar region with neurogenic claudication: Secondary | ICD-10-CM

## 2022-08-19 NOTE — Progress Notes (Signed)
Patient: Elizabeth [name]  Service Category: E/M  Provider: Gillis Santa, MD  DOB: [birth year]  DOS: 08/19/2022  Location: Office  MRN: [id number]  Setting: Ambulatory outpatient  Referring Provider: Virginia Crews, MD  Type: Established Patient  Specialty: Interventional Pain Management  PCP: Virginia Crews, MD  Location: Remote location  Delivery: TeleHealth     Virtual Encounter - Pain Management PROVIDER NOTE: Information contained herein reflects review and annotations entered in association with encounter. Interpretation of such information and data should be left to medically-trained personnel. Information provided to patient can be located elsewhere in the medical record under "Patient Instructions". Document created using STT-dictation technology, any transcriptional errors that may result from process are unintentional.    Contact & Pharmacy Preferred: [phone] Home: [phone] (home) Mobile: [phone] (mobile) E-mail: [email or web address]  [place], [place] - [address] [facility] [address] [place] [address] Phone: [phone] Fax: [phone]  [facility]ORE [place], [place] - [address] [place] [address] Phone: [phone] Fax: [phone]   Pre-screening  Ms. Nicole Kindred offered "in-person" vs "virtual" encounter. She indicated preferring virtual for this encounter.   Reason COVID-19*  Social distancing based on CDC and AMA recommendations.   I contacted [name] on 08/19/2022 via telephone.      I clearly identified myself as Gillis Santa, MD. I verified that I was speaking with the correct person using two identifiers (Name: Elizabeth [name], and date of birth: 1966-04-20).  Consent I sought verbal advanced consent from [name] for virtual visit interactions. I informed Ms. Hafford of possible security and privacy concerns, risks,  and limitations associated with providing "not-in-person" medical evaluation and management services. I also informed Ms. Zani of the availability of "in-person" appointments. Finally, I informed her that there would be a charge for the virtual visit and that she could be  personally, fully or partially, financially responsible for it. Ms. Favaro expressed understanding and agreed to proceed.   Historic Elements   Ms. Elizabeth [name] is a 56 y.o. year old, female patient evaluated today after our last contact on 07/08/2022. Ms. Cwynar  has a past medical history of Bipolar 1 disorder ([place]), Depression, Diabetes mellitus without complication ([place]), diverticulitis, GERD (gastroesophageal reflux disease), Heart murmur, Hyperlipidemia, Hypertension goal BP (blood pressure) < 130/80 (01/17/2016), Insomnia, Irritable bowel syndrome, Motion sickness, Obesity (BMI 30-39.9) (07/03/2015), RA (rheumatoid arthritis) ([place]), and Sciatica. She also  has a past surgical history that includes Tubal ligation; Esophagogastroduodenoscopy (egd) with esophageal dilation; Cholecystectomy (1997); Colonoscopy with propofol (N/A, 09/07/2015); Esophagogastroduodenoscopy (egd) with propofol (N/A, 09/07/2015); polypectomy (N/A, 09/07/2015); Partial colectomy (N/A, 01/24/2016); Bowel resection; Colonoscopy with propofol (N/A, 05/16/2020); Breast biopsy (Right, 12/22/2019); and Breast biopsy. Ms. Adcox has a current medication list which includes the following prescription(s): accu-chek fastclix lancets, ammonium lactate, aspirin, atorvastatin, azelastine, b-d ins syr ultrafine 1cc/31g, accu-chek guide, cholecalciferol, clobetasol ointment, ferrous sulfate, fluticasone, folic acid, accu-chek guide, jardiance, linzess, melatonin, metformin, methotrexate (pf), multiple vitamins-minerals, omega-3 fatty acids, OVER THE COUNTER MEDICATION, tizanidine, vitamin a, cyanocobalamin, and zinc. She  reports that she quit smoking about 7 years ago. Her  smoking use included cigarettes. She has a 1.00 pack-year smoking history. She has never used smokeless tobacco. She reports current alcohol use. She reports that she does not use drugs. Ms. Schissler is allergic to ace inhibitors, enalapril, penicillins, and statins.  HPI  Today, she is being contacted for a post-procedure assessment.   Post-procedure evaluation   Type: Therapeutic Inter-Laminar Epidural Steroid Injection  #2  Region: Lumbar Level: L4-5 Level. Laterality: Left-Sided   Effectiveness:  Initial hour after procedure: 100 %  Subsequent 4-6 hours post-procedure: 100 %  Analgesia past initial 6 hours: 75 % (ongoing)  Ongoing improvement:  Analgesic:  70% Function: Ms. Beissel reports improvement in function ROM: Ms. Billard reports improvement in ROM     Laboratory Chemistry Profile   Renal Lab Results  Component Value Date   BUN 11 02/06/2022   CREATININE 0.93 02/06/2022   LABCREA 98 68/37/2902   BCR NOT APPLICABLE 10/24/5207   GFRAA 96 09/07/2020   GFRNONAA 83 09/07/2020    Hepatic Lab Results  Component Value Date   AST 21 02/06/2022   ALT 30 (H) 02/06/2022   ALBUMIN 4.1 04/01/2017   ALKPHOS 102 04/01/2017   LIPASE 13 09/07/2020    Electrolytes Lab Results  Component Value Date   NA 143 02/06/2022   K 4.6 02/06/2022   CL 109 02/06/2022   CALCIUM 9.4 02/06/2022    Bone Lab Results  Component Value Date   VD25OH 34 07/02/2017    Inflammation (CRP: Acute Phase) (ESR: Chronic Phase) No results found for: "CRP", "ESRSEDRATE", "LATICACIDVEN"       Note: Above Lab results reviewed.   Assessment  The primary encounter diagnosis was Chronic radicular lumbar pain. Diagnoses of Spinal stenosis, lumbar region, with neurogenic claudication and Chronic pain syndrome were also pertinent to this visit.  Plan of Care  Good relief with previous lumbar ESI.  We will continue to monitor symptoms, repeat lumbar epidural steroid injection as needed, as  needed order placed below.   Orders:  Orders Placed This Encounter  Procedures   Lumbar Epidural Injection    Standing Status:   Standing    Number of Occurrences:   2    Standing Expiration Date:   02/17/2023    Scheduling Instructions:     Procedure: Interlaminar Lumbar Epidural Steroid injection (LESI)            Laterality: Midline- L4/5     Sedation: Patient's choice.     Timeframe: PRN patient will call    Order Specific Question:   Where will this procedure be performed?    Answer:   [facility]   Follow-up plan:   Return for repeat L-ESI PRN.     L4/L5 ESI 06/04/21, 05/15/22 6 cc injected, 07/08/22 6 cc   Recent Visits Date Type Provider Dept  07/08/22 Procedure visit Gillis Santa, MD [facility]  06/19/22 Office Visit Gillis Santa, MD [facility]  Showing recent visits within past 90 days and meeting all other requirements Today's Visits Date Type Provider Dept  08/19/22 Office Visit Gillis Santa, MD [facility]  Showing today's visits and meeting all other requirements Future Appointments No visits were found meeting these conditions. Showing future appointments within next 90 days and meeting all other requirements  I discussed the assessment and treatment plan with the patient. The patient was provided an opportunity to ask questions and all were answered. The patient agreed with the plan and demonstrated an understanding of the instructions.  Patient advised to call back or seek an in-person evaluation if the symptoms or condition worsens.  Duration of encounter: 50mnutes.  Note by: BGillis Santa MD Date: 08/19/2022; Time: 3:12 PM

## 2022-08-23 ENCOUNTER — Other Ambulatory Visit: Payer: Self-pay | Admitting: Physician Assistant

## 2022-08-23 ENCOUNTER — Other Ambulatory Visit: Payer: Self-pay | Admitting: Family Medicine

## 2022-08-23 DIAGNOSIS — E1165 Type 2 diabetes mellitus with hyperglycemia: Secondary | ICD-10-CM

## 2022-09-09 ENCOUNTER — Other Ambulatory Visit: Payer: Self-pay | Admitting: Internal Medicine

## 2022-09-09 DIAGNOSIS — E1165 Type 2 diabetes mellitus with hyperglycemia: Secondary | ICD-10-CM

## 2022-09-09 NOTE — Telephone Encounter (Signed)
Requested Prescriptions  Pending Prescriptions Disp Refills  . JARDIANCE 25 MG TABS tablet [Pharmacy Med Name: JARDIANCE 25MG TABLETS] 30 tablet 2    Sig: TAKE 1 TABLET(25 MG) BY MOUTH DAILY BEFORE BREAKFAST     Endocrinology:  Diabetes - SGLT2 Inhibitors Passed - 09/09/2022  9:05 AM      Passed - Cr in normal range and within 360 days    Creat  Date Value Ref Range Status  02/06/2022 0.93 0.50 - 1.03 mg/dL Final   Creatinine, Urine  Date Value Ref Range Status  10/29/2021 98 20 - 275 mg/dL Final         Passed - HBA1C is between 0 and 7.9 and within 180 days    Hemoglobin A1C  Date Value Ref Range Status  06/13/2022 7.8 (A) 4.0 - 5.6 % Final   Hgb A1c MFr Bld  Date Value Ref Range Status  02/06/2022 7.7 (H) <5.7 % of total Hgb Final    Comment:    For someone without known diabetes, a hemoglobin A1c value of 6.5% or greater indicates that they may have  diabetes and this should be confirmed with a follow-up  test. . For someone with known diabetes, a value <7% indicates  that their diabetes is well controlled and a value  greater than or equal to 7% indicates suboptimal  control. A1c targets should be individualized based on  duration of diabetes, age, comorbid conditions, and  other considerations. . Currently, no consensus exists regarding use of hemoglobin A1c for diagnosis of diabetes for children. .          Passed - eGFR in normal range and within 360 days    GFR, Est African American  Date Value Ref Range Status  09/07/2020 96 > OR = 60 mL/min/1.31m Final   GFR, Est Non African American  Date Value Ref Range Status  09/07/2020 83 > OR = 60 mL/min/1.78mFinal   eGFR  Date Value Ref Range Status  02/06/2022 73 > OR = 60 mL/min/1.7381minal    Comment:    The eGFR is based on the CKD-EPI 2021 equation. To calculate  the new eGFR from a previous Creatinine or Cystatin C result, go to https://www.kidney.org/professionals/ kdoqi/gfr%5Fcalculator           Passed - Valid encounter within last 6 months    Recent Outpatient Visits          3 months ago [facility] with [facility] ([facility]   7 months ago Type 2 diabetes mellitus with hyperglycemia, without long-term current use of insulin ([facility]   10 months ago Type 2 diabetes mellitus with hyperglycemia, without long-term current use of insulin ([facility]   1 year ago Hypertension goal BP (blood pressure) < 130/80   CHMAhtanumO   1 year ago Hypertension goal BP (blood pressure) < 130/80   CHMSul[facility] RockO      Future Appointments            In 4 days Bacigalupo, AngDionne BucyD [facility]GilmoreIn 6 months ThoPhilip AspenNMChesterfieldGYN

## 2022-09-10 NOTE — Telephone Encounter (Signed)
Unable to refill per protocol, request is too soon, last refill was 09/09/22 for 30, and 2 RF. E-Prescribing Status: Receipt confirmed by pharmacy (09/09/2022 4:29 PM). Will refuse duplicate request.  Requested Prescriptions  Pending Prescriptions Disp Refills  . JARDIANCE 25 MG TABS tablet [Pharmacy Med Name: JARDIANCE $RemoveBeforeD'25MG'eRKzKPzmTiROLI$  TABLETS] 30 tablet 2    Sig: TAKE 1 TABLET(25 MG) BY MOUTH DAILY BEFORE BREAKFAST     Endocrinology:  Diabetes - SGLT2 Inhibitors Passed - 09/09/2022 11:02 AM      Passed - Cr in normal range and within 360 days    Creat  Date Value Ref Range Status  02/06/2022 0.93 0.50 - 1.03 mg/dL Final   Creatinine, Urine  Date Value Ref Range Status  10/29/2021 98 20 - 275 mg/dL Final         Passed - HBA1C is between 0 and 7.9 and within 180 days    Hemoglobin A1C  Date Value Ref Range Status  06/13/2022 7.8 (A) 4.0 - 5.6 % Final   Hgb A1c MFr Bld  Date Value Ref Range Status  02/06/2022 7.7 (H) <5.7 % of total Hgb Final    Comment:    For someone without known diabetes, a hemoglobin A1c value of 6.5% or greater indicates that they may have  diabetes and this should be confirmed with a follow-up  test. . For someone with known diabetes, a value <7% indicates  that their diabetes is well controlled and a value  greater than or equal to 7% indicates suboptimal  control. A1c targets should be individualized based on  duration of diabetes, age, comorbid conditions, and  other considerations. . Currently, no consensus exists regarding use of hemoglobin A1c for diagnosis of diabetes for children. .          Passed - eGFR in normal range and within 360 days    GFR, Est African American  Date Value Ref Range Status  09/07/2020 96 > OR = 60 mL/min/1.55m2 Final   GFR, Est Non African American  Date Value Ref Range Status  09/07/2020 83 > OR = 60 mL/min/1.21m2 Final   eGFR  Date Value Ref Range Status  02/06/2022 73 > OR = 60 mL/min/1.57m2 Final    Comment:     The eGFR is based on the CKD-EPI 2021 equation. To calculate  the new eGFR from a previous Creatinine or Cystatin C result, go to https://www.kidney.org/professionals/ kdoqi/gfr%5Fcalculator          Passed - Valid encounter within last 6 months    Recent Outpatient Visits          3 months ago Hypertension associated with diabetes [facility])   [facility] [place], Dionne Bucy, MD   7 months ago Type 2 diabetes mellitus with hyperglycemia, without long-term current use of insulin [facility])   [facility] Teodora Medici, DO   10 months ago Type 2 diabetes mellitus with hyperglycemia, without long-term current use of insulin [facility])   [place], DO   1 year ago Hypertension goal BP (blood pressure) < 130/80   [place], DO   1 year ago Hypertension goal BP (blood pressure) < 130/80   [place], DO      Future Appointments            In 3 days Bacigalupo, Dionne Bucy, MD [facility], [place]   In 6 months Philip Aspen, [place]  [place] [facility]

## 2022-09-12 NOTE — Progress Notes (Signed)
I,Sulibeya S Dimas,acting as a scribe for Lavon Paganini, MD.,have documented all relevant documentation on the behalf of Lavon Paganini, MD,as directed by  Lavon Paganini, MD while in the presence of Lavon Paganini, MD.     Established patient visit   Patient: Elizabeth [name]   DOB: 1965/12/19   56 y.o. Female  MRN: [id number] Visit Date: 09/13/2022  Today's healthcare provider: Lavon Paganini, MD   Chief Complaint  Patient presents with   Diabetes   Hypertension   Subjective    HPI  Diabetes Mellitus Type II, Follow-up  Lab Results  Component Value Date   HGBA1C 7.1 08/14/2022   HGBA1C 7.8 (A) 06/13/2022   HGBA1C 7.7 (H) 02/06/2022   Wt Readings from Last 3 Encounters:  09/13/22 221 lb (100.2 kg)  07/08/22 221 lb (100.2 kg)  06/10/22 224 lb (101.6 kg)   Last seen for diabetes 3 months ago.  Management since then includes increase jardiance to 6m daily. She reports excellent compliance with treatment. She is not having side effects.  Symptoms: No fatigue No foot ulcerations  No appetite changes No nausea  No paresthesia of the feet  No polydipsia  No polyuria No visual disturbances   No vomiting     Home blood sugar records: fasting range: 100-130s  Episodes of hypoglycemia? No    Current insulin regiment: none Most Recent Eye Exam: utd Current exercise: walking Current diet habits: in general, a "healthy" diet    Pertinent Labs: Lab Results  Component Value Date   CHOL 135 10/29/2021   HDL 43 (L) 10/29/2021   LDLCALC 66 10/29/2021   TRIG 185 (H) 10/29/2021   CHOLHDL 3.1 10/29/2021   Lab Results  Component Value Date   NA 143 02/06/2022   K 4.6 02/06/2022   CREATININE 0.93 02/06/2022   EGFR 73 02/06/2022   MICROALBUR 0.3 10/29/2021     --------------------------------------------------------------------------------------------------- Hypertension, follow-up  BP Readings from Last 3 Encounters:  09/13/22 107/74  07/08/22  (!) 137/99  06/10/22 122/74   Wt Readings from Last 3 Encounters:  09/13/22 221 lb (100.2 kg)  07/08/22 221 lb (100.2 kg)  06/10/22 224 lb (101.6 kg)     She was last seen for hypertension 3 months ago.  BP at that visit was 137/99. Management since that visit includes no changes.  She reports excellent compliance with treatment. She is not having side effects.  She is following a Regular diet. She is exercising. She does not smoke.  Use of agents associated with hypertension: none.   Outside blood pressures are not being checked. Symptoms: No chest pain No chest pressure  No palpitations No syncope  No dyspnea No orthopnea  No paroxysmal nocturnal dyspnea No lower extremity edema   Pertinent labs Lab Results  Component Value Date   CHOL 135 10/29/2021   HDL 43 (L) 10/29/2021   LDLCALC 66 10/29/2021   TRIG 185 (H) 10/29/2021   CHOLHDL 3.1 10/29/2021   Lab Results  Component Value Date   NA 143 02/06/2022   K 4.6 02/06/2022   CREATININE 0.93 02/06/2022   EGFR 73 02/06/2022   GLUCOSE 146 (H) 02/06/2022   TSH 1.11 09/07/2020     The 10-year ASCVD risk score (Arnett DK, et al., 2019) is: 2.3%  ---------------------------------------------------------------------------------------------------   Medications: Outpatient Medications Prior to Visit  Medication Sig   Accu-Chek FastClix Lancets MISC USE TO CHECK BLOOD SUGAR TWICE DAILY   ammonium lactate (AMLACTIN) 12 % lotion Apply 1 application. topically as  needed for dry skin.   aspirin 81 MG tablet Take 81 mg by mouth daily.   atorvastatin (LIPITOR) 10 MG tablet TAKE 1 TABLET(10 MG) BY MOUTH AT BEDTIME   azelastine (ASTELIN) 0.1 % nasal spray Place 2 sprays into both nostrils 2 (two) times daily. Use in each nostril as directed   B-D INS SYR ULTRAFINE 1CC/31G 31G X 5/16" 1 ML MISC    Blood Glucose Monitoring Suppl (ACCU-CHEK GUIDE) w/Device KIT AS DIRECTED EVERY DAY   cholecalciferol (VITAMIN D) 1000 units tablet  Take 1,000 Units by mouth daily.   clobetasol ointment (TEMOVATE) 0.05 % APPLY EXTERNALLY TO THE AFFECTED AREA TWICE DAILY   ferrous sulfate 324 (65 Fe) MG TBEC Take 1 tablet (325 mg total) by mouth daily.   fluticasone (FLONASE) 50 MCG/ACT nasal spray Place into both nostrils daily.   folic acid (FOLVITE) 1 MG tablet Take 1 mg by mouth daily.   glucose blood (ACCU-CHEK GUIDE) test strip USE TO CHECK BLOOD SUGARS AS DIRECTED BY MD   JARDIANCE 25 MG TABS tablet TAKE 1 TABLET(25 MG) BY MOUTH DAILY BEFORE BREAKFAST   LINZESS 290 MCG CAPS capsule TAKE 1 CAPSULE(290 MCG) BY MOUTH DAILY   melatonin 5 MG TABS Take 5 mg by mouth at bedtime as needed.   metFORMIN (GLUCOPHAGE-XR) 500 MG 24 hr tablet TAKE 2 TABLETS(1000 MG) BY MOUTH TWICE DAILY   Methotrexate Sodium (METHOTREXATE, PF,) 50 MG/2ML injection 15 mg once a week.   Multiple Vitamins-Minerals (MULTIVITAMIN/EXTRA VITAMIN D3 PO) Take by mouth.   Omega-3 Fatty Acids (FISH OIL PO) Take by mouth.   OVER THE COUNTER MEDICATION once daily Herbal Name: Goli   tiZANidine (ZANAFLEX) 4 MG tablet Take 1 tablet (4 mg total) by mouth at bedtime as needed for muscle spasms.   Vitamin A 2400 MCG (8000 UT) TABS Take by mouth.   vitamin B-12 (CYANOCOBALAMIN) 1000 MCG tablet Take 1,000 mcg by mouth daily.   Zinc 22.5 MG TABS Take by mouth.   No facility-administered medications prior to visit.    Review of Systems  Constitutional:  Negative for appetite change and fatigue.  Eyes:  Negative for visual disturbance.  Respiratory:  Negative for chest tightness and shortness of breath.   Cardiovascular:  Negative for chest pain, palpitations and leg swelling.  Gastrointestinal:  Negative for abdominal pain, nausea and vomiting.       Objective    BP 107/74 (BP Location: Left Arm, Patient Position: Sitting, Cuff Size: Large)   Pulse 75   Temp 97.8 F (36.6 C) (Oral)   Resp 16   Wt 221 lb (100.2 kg)   LMP 07/09/2014   BMI 34.61 kg/m  BP Readings from  Last 3 Encounters:  09/13/22 107/74  07/08/22 (!) 137/99  06/10/22 122/74   Wt Readings from Last 3 Encounters:  09/13/22 221 lb (100.2 kg)  07/08/22 221 lb (100.2 kg)  06/10/22 224 lb (101.6 kg)      Physical Exam Vitals reviewed.  Constitutional:      General: She is not in acute distress.    Appearance: Normal appearance. She is well-developed. She is not diaphoretic.  HENT:     Head: Normocephalic and atraumatic.  Eyes:     General: No scleral icterus.    Conjunctiva/sclera: Conjunctivae normal.  Neck:     Thyroid: No thyromegaly.  Cardiovascular:     Rate and Rhythm: Normal rate and regular rhythm.     Pulses: Normal pulses.     Heart sounds:  Normal heart sounds. No murmur heard. Pulmonary:     Effort: Pulmonary effort is normal. No respiratory distress.     Breath sounds: Normal breath sounds. No wheezing, rhonchi or rales.  Musculoskeletal:     Right lower leg: No edema.     Left lower leg: No edema.  Skin:    General: Skin is warm and dry.     Findings: No rash.  Neurological:     Mental Status: She is alert and oriented to person, place, and time. Mental status is at baseline.  Psychiatric:        Mood and Affect: Mood normal.        Behavior: Behavior normal.       Results for orders placed or performed in visit on 09/13/22  Hemoglobin A1c  Result Value Ref Range   Hemoglobin A1C 7.1     Assessment & Plan     Problem List Items Addressed This Visit       Cardiovascular and Mediastinum   Hypertension associated with diabetes ([place])    Well controlled Continue current medications Recheck metabolic panel at next visit F/u in 3 months         Endocrine   Type 2 diabetes mellitus with hyperglycemia, without long-term current use of insulin ([facility]) - Primary    Chronic and improving Still above goal Will continue current meds Recheck in 70mUTD on screenings UACR at next visit      Hyperlipidemia associated with type 2 diabetes mellitus  (HGallatin    Previously well controlled Continue statin Repeat FLP and CMP at next visit Goal LDL < 70        Other   Immunosuppressed status ([place]    Stable  On MTX      Obesity    Congratulated on weight loss Discussed importance of healthy weight management Discussed diet and exercise         Return in about 3 months (around 12/14/2022) for chronic disease f/u.      I, ALavon Paganini MD, have reviewed all documentation for this visit. The documentation on 09/13/22 for the exam, diagnosis, procedures, and orders are all accurate and complete.   Karlye Ihrig, ADionne Bucy MD, MPH [facility]North EastGroup

## 2022-09-13 ENCOUNTER — Encounter: Payer: Self-pay | Admitting: Family Medicine

## 2022-09-13 ENCOUNTER — Ambulatory Visit (INDEPENDENT_AMBULATORY_CARE_PROVIDER_SITE_OTHER): Payer: Medicare Other | Admitting: Family Medicine

## 2022-09-13 VITALS — BP 107/74 | HR 75 | Temp 97.8°F | Resp 16 | Wt 221.0 lb

## 2022-09-13 DIAGNOSIS — E1165 Type 2 diabetes mellitus with hyperglycemia: Secondary | ICD-10-CM

## 2022-09-13 DIAGNOSIS — E785 Hyperlipidemia, unspecified: Secondary | ICD-10-CM | POA: Diagnosis not present

## 2022-09-13 DIAGNOSIS — E669 Obesity, unspecified: Secondary | ICD-10-CM | POA: Diagnosis not present

## 2022-09-13 DIAGNOSIS — I152 Hypertension secondary to endocrine disorders: Secondary | ICD-10-CM | POA: Diagnosis not present

## 2022-09-13 DIAGNOSIS — E1169 Type 2 diabetes mellitus with other specified complication: Secondary | ICD-10-CM

## 2022-09-13 DIAGNOSIS — Z6834 Body mass index (BMI) 34.0-34.9, adult: Secondary | ICD-10-CM

## 2022-09-13 DIAGNOSIS — E1159 Type 2 diabetes mellitus with other circulatory complications: Secondary | ICD-10-CM

## 2022-09-13 DIAGNOSIS — D849 Immunodeficiency, unspecified: Secondary | ICD-10-CM | POA: Diagnosis not present

## 2022-09-13 NOTE — Assessment & Plan Note (Signed)
Previously well controlled Continue statin Repeat FLP and CMP at next visit Goal LDL < 70  

## 2022-09-13 NOTE — Assessment & Plan Note (Signed)
Stable  On MTX

## 2022-09-13 NOTE — Assessment & Plan Note (Signed)
Congratulated on weight loss ?Discussed importance of healthy weight management ?Discussed diet and exercise  ?

## 2022-09-13 NOTE — Assessment & Plan Note (Signed)
Well controlled Continue current medications Recheck metabolic panel at next visit F/u in 3 months

## 2022-09-13 NOTE — Assessment & Plan Note (Signed)
Chronic and improving Still above goal Will continue current meds Recheck in 85mUTD on screenings UACR at next visit

## 2022-10-06 ENCOUNTER — Other Ambulatory Visit: Payer: Self-pay | Admitting: Family Medicine

## 2022-10-14 ENCOUNTER — Other Ambulatory Visit: Payer: Self-pay | Admitting: Physician Assistant

## 2022-10-14 DIAGNOSIS — E1165 Type 2 diabetes mellitus with hyperglycemia: Secondary | ICD-10-CM

## 2022-10-15 NOTE — Telephone Encounter (Signed)
Requested Prescriptions  Pending Prescriptions Disp Refills   metFORMIN (GLUCOPHAGE-XR) 500 MG 24 hr tablet [Pharmacy Med Name: METFORMIN ER 500MG 24HR TABS] 360 tablet 0    Sig: TAKE 2 TABLETS(1000 MG) BY MOUTH TWICE DAILY     Endocrinology:  Diabetes - Biguanides Failed - 10/14/2022  9:55 PM      Failed - B12 Level in normal range and within 720 days    No results found for: "VITAMINB12"       Failed - CBC within normal limits and completed in the last 12 months    WBC  Date Value Ref Range Status  09/07/2020 4.8 3.8 - 10.8 Thousand/uL Final   RBC  Date Value Ref Range Status  09/07/2020 3.98 3.80 - 5.10 Million/uL Final   Hemoglobin  Date Value Ref Range Status  09/07/2020 12.3 11.7 - 15.5 g/dL Final   HCT  Date Value Ref Range Status  09/07/2020 37.2 35.0 - 45.0 % Final   MCHC  Date Value Ref Range Status  09/07/2020 33.1 32.0 - 36.0 g/dL Final   [facility]  Date Value Ref Range Status  09/07/2020 30.9 27.0 - 33.0 pg Final   MCV  Date Value Ref Range Status  09/07/2020 93.5 80.0 - 100.0 fL Final   No results found for: "PLTCOUNTKUC", "LABPLAT", "POCPLA" RDW  Date Value Ref Range Status  09/07/2020 13.4 11.0 - 15.0 % Final         Passed - Cr in normal range and within 360 days    Creat  Date Value Ref Range Status  02/06/2022 0.93 0.50 - 1.03 mg/dL Final   Creatinine, Urine  Date Value Ref Range Status  10/29/2021 98 20 - 275 mg/dL Final         Passed - HBA1C is between 0 and 7.9 and within 180 days    Hemoglobin A1C  Date Value Ref Range Status  08/14/2022 7.1  Final         Passed - eGFR in normal range and within 360 days    GFR, Est African American  Date Value Ref Range Status  09/07/2020 96 > OR = 60 mL/min/1.73m Final   GFR, Est Non African American  Date Value Ref Range Status  09/07/2020 83 > OR = 60 mL/min/1.729mFinal   eGFR  Date Value Ref Range Status  02/06/2022 73 > OR = 60 mL/min/1.7324minal    Comment:    The eGFR is based on  the CKD-EPI 2021 equation. To calculate  the new eGFR from a previous Creatinine or Cystatin C result, go to https://www.kidney.org/professionals/ kdoqi/gfr%5Fcalculator          Passed - Valid encounter within last 6 months    Recent Outpatient Visits           1 month ago Type 2 diabetes mellitus with hyperglycemia, without long-term current use of insulin ([facility] Bu[facility]   4 months ago Hypertension associated with diabetes ([facility] Sound[facility]Dionne BucyD   8 months ago Type 2 diabetes mellitus with hyperglycemia, without long-term current use of insulin ([facility] MediciO   11 months ago Type 2 diabetes mellitus with hyperglycemia, without long-term current use of insulin ([facility] CHMPu[place] IslandO   1 year ago Hypertension goal BP (blood pressure) < 130/80   [facility]mMyles GipONevada  Future Appointments             In 2 months Bacigalupo, Dionne Bucy, MD [facility], [place]   In 5 months Philip Aspen, [place] OBGYN

## 2022-11-12 ENCOUNTER — Other Ambulatory Visit: Payer: Self-pay | Admitting: Pain Medicine

## 2022-11-12 DIAGNOSIS — G894 Chronic pain syndrome: Secondary | ICD-10-CM

## 2022-11-22 ENCOUNTER — Other Ambulatory Visit: Payer: Self-pay | Admitting: Family Medicine

## 2022-11-25 NOTE — Telephone Encounter (Signed)
Requested Prescriptions  Pending Prescriptions Disp Refills   glucose blood (ACCU-CHEK GUIDE) test strip [Pharmacy Med Name: ACCU-CHEK GUIDE TEST STRIPS 50] 50 strip 5    Sig: USE TO CHECK BLOOD SUGARS AS DIRECTED BY DOCTOR TWICE DAILY     Endocrinology: Diabetes - Testing Supplies Passed - 11/22/2022 10:53 PM      Passed - Valid encounter within last 12 months    Recent Outpatient Visits           2 months ago Type 2 diabetes mellitus with hyperglycemia, without long-term current use of insulin [facility])   [facility] [place], Dionne Bucy, MD   5 months ago Hypertension associated with diabetes [facility])   [facility] [place], Dionne Bucy, MD   9 months ago Type 2 diabetes mellitus with hyperglycemia, without long-term current use of insulin [facility])   [facility] Teodora Medici, DO   1 year ago Type 2 diabetes mellitus with hyperglycemia, without long-term current use of insulin [facility])   [place], DO   1 year ago Hypertension goal BP (blood pressure) < 130/80   [place], DO       Future Appointments             In 3 weeks Bacigalupo, Dionne Bucy, MD [facility], [place]   In 4 months Philip Aspen, [place] OBGYN

## 2022-12-08 ENCOUNTER — Other Ambulatory Visit: Payer: Self-pay | Admitting: Family Medicine

## 2022-12-08 DIAGNOSIS — E1165 Type 2 diabetes mellitus with hyperglycemia: Secondary | ICD-10-CM

## 2022-12-17 ENCOUNTER — Ambulatory Visit (INDEPENDENT_AMBULATORY_CARE_PROVIDER_SITE_OTHER): Payer: Medicare Other | Admitting: Family Medicine

## 2022-12-17 ENCOUNTER — Encounter: Payer: Self-pay | Admitting: Family Medicine

## 2022-12-17 VITALS — BP 117/73 | HR 71 | Temp 97.9°F | Resp 16 | Wt 219.0 lb

## 2022-12-17 DIAGNOSIS — E1169 Type 2 diabetes mellitus with other specified complication: Secondary | ICD-10-CM | POA: Diagnosis not present

## 2022-12-17 DIAGNOSIS — E1159 Type 2 diabetes mellitus with other circulatory complications: Secondary | ICD-10-CM | POA: Diagnosis not present

## 2022-12-17 DIAGNOSIS — M05742 Rheumatoid arthritis with rheumatoid factor of left hand without organ or systems involvement: Secondary | ICD-10-CM

## 2022-12-17 DIAGNOSIS — I152 Hypertension secondary to endocrine disorders: Secondary | ICD-10-CM | POA: Diagnosis not present

## 2022-12-17 DIAGNOSIS — M05741 Rheumatoid arthritis with rheumatoid factor of right hand without organ or systems involvement: Secondary | ICD-10-CM

## 2022-12-17 DIAGNOSIS — E1165 Type 2 diabetes mellitus with hyperglycemia: Secondary | ICD-10-CM

## 2022-12-17 DIAGNOSIS — E785 Hyperlipidemia, unspecified: Secondary | ICD-10-CM | POA: Diagnosis not present

## 2022-12-17 DIAGNOSIS — Z1231 Encounter for screening mammogram for malignant neoplasm of breast: Secondary | ICD-10-CM

## 2022-12-17 DIAGNOSIS — F319 Bipolar disorder, unspecified: Secondary | ICD-10-CM

## 2022-12-17 DIAGNOSIS — D849 Immunodeficiency, unspecified: Secondary | ICD-10-CM

## 2022-12-17 LAB — POCT GLYCOSYLATED HEMOGLOBIN (HGB A1C): Hemoglobin A1C: 7.6 % — AB (ref 4.0–5.6)

## 2022-12-17 MED ORDER — OZEMPIC (0.25 OR 0.5 MG/DOSE) 2 MG/3ML ~~LOC~~ SOPN: PEN_INJECTOR | SUBCUTANEOUS | 1 refills | Status: AC

## 2022-12-17 NOTE — Assessment & Plan Note (Signed)
F/b Rheum

## 2022-12-17 NOTE — Progress Notes (Signed)
I,Sulibeya S Dimas,acting as a scribe for Lavon Paganini, MD.,have documented all relevant documentation on the behalf of Lavon Paganini, MD,as directed by  Lavon Paganini, MD while in the presence of Lavon Paganini, MD.     Established patient visit   Patient: Elizabeth [name]   DOB: 08/13/1966   57 y.o. Female  MRN: [id number] Visit Date: 12/17/2022  Today's healthcare provider: Lavon Paganini, MD   Chief Complaint  Patient presents with   Diabetes   Hypertension   Subjective    HPI  Diabetes Mellitus Type II, Follow-up  Lab Results  Component Value Date   HGBA1C 7.1 08/14/2022   HGBA1C 7.8 (A) 06/13/2022   HGBA1C 7.7 (H) 02/06/2022   Wt Readings from Last 3 Encounters:  12/17/22 219 lb (99.3 kg)  09/13/22 221 lb (100.2 kg)  07/08/22 221 lb (100.2 kg)   Last seen for diabetes 3 months ago.  Management since then includes no changes. She reports excellent compliance with treatment.   Episodes of hypoglycemia? No   Current insulin regiment: none Most Recent Eye Exam:   Pertinent Labs: Lab Results  Component Value Date   CHOL 135 10/29/2021   HDL 43 (L) 10/29/2021   LDLCALC 66 10/29/2021   TRIG 185 (H) 10/29/2021   CHOLHDL 3.1 10/29/2021   Lab Results  Component Value Date   NA 143 02/06/2022   K 4.6 02/06/2022   CREATININE 0.93 02/06/2022   EGFR 73 02/06/2022   MICROALBUR 0.3 10/29/2021     --------------------------------------------------------------------------------------------------- Hypertension, follow-up  BP Readings from Last 3 Encounters:  12/17/22 117/73  09/13/22 107/74  07/08/22 (!) 137/99   Wt Readings from Last 3 Encounters:  12/17/22 219 lb (99.3 kg)  09/13/22 221 lb (100.2 kg)  07/08/22 221 lb (100.2 kg)     She was last seen for hypertension 3 months ago.  BP at that visit was 107/74. Management since that visit includes no changes.  She reports excellent compliance with treatment. She is not having side  effects.    Lab Results  Component Value Date   CHOL 135 10/29/2021   HDL 43 (L) 10/29/2021   LDLCALC 66 10/29/2021   TRIG 185 (H) 10/29/2021   CHOLHDL 3.1 10/29/2021   Lab Results  Component Value Date   NA 143 02/06/2022   K 4.6 02/06/2022   CREATININE 0.93 02/06/2022   EGFR 73 02/06/2022   GLUCOSE 146 (H) 02/06/2022   TSH 1.11 09/07/2020     The 10-year ASCVD risk score (Arnett DK, et al., 2019) is: 2.7%  ---------------------------------------------------------------------------------------------------   Medications: Outpatient Medications Prior to Visit  Medication Sig   Accu-Chek FastClix Lancets MISC USE TO CHECK BLOOD SUGAR TWICE DAILY   ammonium lactate (AMLACTIN) 12 % lotion Apply 1 application. topically as needed for dry skin.   aspirin 81 MG tablet Take 81 mg by mouth daily.   atorvastatin (LIPITOR) 10 MG tablet TAKE 1 TABLET(10 MG) BY MOUTH AT BEDTIME   azelastine (ASTELIN) 0.1 % nasal spray Place 2 sprays into both nostrils 2 (two) times daily. Use in each nostril as directed   B-D INS SYR ULTRAFINE 1CC/31G 31G X 5/16" 1 ML MISC    Blood Glucose Monitoring Suppl (ACCU-CHEK GUIDE) w/Device KIT AS DIRECTED EVERY DAY   cholecalciferol (VITAMIN D) 1000 units tablet Take 1,000 Units by mouth daily.   clobetasol ointment (TEMOVATE) 0.05 % APPLY EXTERNALLY TO THE AFFECTED AREA TWICE DAILY   empagliflozin (JARDIANCE) 25 MG TABS tablet TAKE 1 TABLET(25  MG) BY MOUTH DAILY BEFORE BREAKFAST   ferrous sulfate 324 (65 Fe) MG TBEC Take 1 tablet (325 mg total) by mouth daily.   fluticasone (FLONASE) 50 MCG/ACT nasal spray Place into both nostrils daily.   folic acid (FOLVITE) 1 MG tablet Take 1 mg by mouth daily.   glucose blood (ACCU-CHEK GUIDE) test strip USE TO CHECK BLOOD SUGARS AS DIRECTED BY DOCTOR TWICE DAILY   LINZESS 290 MCG CAPS capsule TAKE 1 CAPSULE(290 MCG) BY MOUTH DAILY   melatonin 5 MG TABS Take 5 mg by mouth at bedtime as needed.   metFORMIN (GLUCOPHAGE-XR)  500 MG 24 hr tablet TAKE 2 TABLETS(1000 MG) BY MOUTH TWICE DAILY   Methotrexate Sodium (METHOTREXATE, PF,) 50 MG/2ML injection 15 mg once a week.   Multiple Vitamins-Minerals (MULTIVITAMIN/EXTRA VITAMIN D3 PO) Take by mouth.   Omega-3 Fatty Acids (FISH OIL PO) Take by mouth.   OVER THE COUNTER MEDICATION once daily Herbal Name: Goli   tiZANidine (ZANAFLEX) 4 MG tablet Take 1 tablet (4 mg total) by mouth at bedtime as needed for muscle spasms.   Vitamin A 2400 MCG (8000 UT) TABS Take by mouth.   vitamin B-12 (CYANOCOBALAMIN) 1000 MCG tablet Take 1,000 mcg by mouth daily.   Zinc 22.5 MG TABS Take by mouth.   No facility-administered medications prior to visit.    Review of Systems per HPI     Objective    BP 117/73 (BP Location: Right Arm, Patient Position: Sitting, Cuff Size: Large)   Pulse 71   Temp 97.9 F (36.6 C) (Temporal)   Resp 16   Wt 219 lb (99.3 kg)   LMP 07/09/2014   BMI 34.30 kg/m    Physical Exam Vitals reviewed.  Constitutional:      General: She is not in acute distress.    Appearance: Normal appearance. She is well-developed. She is not diaphoretic.  HENT:     Head: Normocephalic and atraumatic.  Eyes:     General: No scleral icterus.    Conjunctiva/sclera: Conjunctivae normal.  Neck:     Thyroid: No thyromegaly.  Cardiovascular:     Rate and Rhythm: Normal rate and regular rhythm.     Heart sounds: Normal heart sounds. No murmur heard. Pulmonary:     Effort: Pulmonary effort is normal. No respiratory distress.     Breath sounds: Normal breath sounds. No wheezing, rhonchi or rales.  Musculoskeletal:     Cervical back: Neck supple.     Right lower leg: No edema.     Left lower leg: No edema.  Lymphadenopathy:     Cervical: No cervical adenopathy.  Skin:    General: Skin is warm and dry.     Findings: No rash.  Neurological:     Mental Status: She is alert and oriented to person, place, and time. Mental status is at baseline.  Psychiatric:         Mood and Affect: Mood normal.        Behavior: Behavior normal.      No results found for any visits on 12/17/22.  Assessment & Plan     Problem List Items Addressed This Visit       Cardiovascular and Mediastinum   Hypertension associated with diabetes ([place])    Well controlled Continue current medications Recheck metabolic panel F/u in 6 months       Relevant Medications   Semaglutide,0.25 or 0.'5MG'$ /DOS, (OZEMPIC, 0.25 OR 0.5 MG/DOSE,) 2 MG/3ML SOPN   Other Relevant Orders  Comprehensive metabolic panel   Lipid panel     Endocrine   Type 2 diabetes mellitus with hyperglycemia, without long-term current use of insulin (HCC) - Primary    Chronic and uncontrolled with hyperglycemia (A1c 7.6 today) Assoc with HTN and HLD Continue current medications Add ozempic (can use mounjaro if ozempic not available) UTD on vaccines, eye exam, foot exam UACR today On Statin Discussed diet and exercise F/u in 3 months       Relevant Medications   Semaglutide,0.25 or 0.'5MG'$ /DOS, (OZEMPIC, 0.25 OR 0.5 MG/DOSE,) 2 MG/3ML SOPN   Other Relevant Orders   POCT glycosylated hemoglobin (Hb A1C)   Comprehensive metabolic panel   Lipid panel   Urine Microalbumin w/creat. ratio   Hyperlipidemia associated with type 2 diabetes mellitus (HCC)    Previously well controlled Continue statin Repeat FLP and CMP Goal LDL < 70      Relevant Medications   Semaglutide,0.25 or 0.'5MG'$ /DOS, (OZEMPIC, 0.25 OR 0.5 MG/DOSE,) 2 MG/3ML SOPN   Other Relevant Orders   Comprehensive metabolic panel   Lipid panel     Musculoskeletal and Integument   Rheumatoid arthritis involving both hands (HCC) (Chronic)    F/b Rheum        Other   Immunosuppressed status ([place])    F/b Rheum Advised on vaccinations      Other Visit Diagnoses     Bipolar affective disorder, remission status unspecified ([place])   (Chronic)     Encounter for screening mammogram for malignant neoplasm of breast       Relevant  Orders   MM 3D SCREEN BREAST BILATERAL        Return in about 3 months (around 03/18/2023) for chronic disease f/u.      I, Lavon Paganini, MD, have reviewed all documentation for this visit. The documentation on 12/17/22 for the exam, diagnosis, procedures, and orders are all accurate and complete.   Elizabeth [name], Dionne Bucy, MD, MPH [facility]

## 2022-12-17 NOTE — Assessment & Plan Note (Signed)
Well controlled Continue current medications Recheck metabolic panel F/u in 6 months  

## 2022-12-17 NOTE — Assessment & Plan Note (Signed)
Previously well controlled Continue statin Repeat FLP and CMP Goal LDL < 70 

## 2022-12-17 NOTE — Assessment & Plan Note (Signed)
F/b Rheum Advised on vaccinations

## 2022-12-17 NOTE — Assessment & Plan Note (Signed)
Chronic and uncontrolled with hyperglycemia (A1c 7.6 today) Assoc with HTN and HLD Continue current medications Add ozempic (can use mounjaro if ozempic not available) UTD on vaccines, eye exam, foot exam UACR today On Statin Discussed diet and exercise F/u in 3 months

## 2022-12-18 LAB — LIPID PANEL
Chol/HDL Ratio: 3.7 ratio (ref 0.0–4.4)
Cholesterol, Total: 164 mg/dL (ref 100–199)
HDL: 44 mg/dL (ref >39)
LDL Chol Calc (NIH): 76 mg/dL (ref 0–99)
Triglycerides: 272 mg/dL — ABNORMAL HIGH (ref 0–149)
VLDL Cholesterol Cal: 44 mg/dL — ABNORMAL HIGH (ref 5–40)

## 2022-12-18 LAB — COMPREHENSIVE METABOLIC PANEL
ALT: 34 IU/L — ABNORMAL HIGH (ref 0–32)
AST: 30 IU/L (ref 0–40)
Albumin/Globulin Ratio: 2 (ref 1.2–2.2)
Albumin: 4.1 g/dL (ref 3.8–4.9)
Alkaline Phosphatase: 84 IU/L (ref 44–121)
BUN/Creatinine Ratio: 15 (ref 9–23)
BUN: 11 mg/dL (ref 6–24)
Bilirubin Total: 0.3 mg/dL (ref 0.0–1.2)
CO2: 19 mmol/L — ABNORMAL LOW (ref 20–29)
Calcium: 9.7 mg/dL (ref 8.7–10.2)
Chloride: 106 mmol/L (ref 96–106)
Creatinine, Ser: 0.75 mg/dL (ref 0.57–1.00)
Globulin, Total: 2.1 g/dL (ref 1.5–4.5)
Glucose: 152 mg/dL — ABNORMAL HIGH (ref 70–99)
Potassium: 5.3 mmol/L — ABNORMAL HIGH (ref 3.5–5.2)
Sodium: 142 mmol/L (ref 134–144)
Total Protein: 6.2 g/dL (ref 6.0–8.5)
eGFR: 94 mL/min/{1.73_m2} (ref >59)

## 2022-12-18 LAB — SPECIMEN STATUS REPORT

## 2022-12-18 LAB — MICROALBUMIN / CREATININE URINE RATIO
Creatinine, Urine: 60.1 mg/dL
Microalb/Creat Ratio: <5 mg/g creat (ref 0–29)
Microalbumin, Urine: <3 ug/mL

## 2022-12-24 ENCOUNTER — Other Ambulatory Visit: Payer: Self-pay | Admitting: Internal Medicine

## 2022-12-24 DIAGNOSIS — E785 Hyperlipidemia, unspecified: Secondary | ICD-10-CM

## 2022-12-24 NOTE — Telephone Encounter (Signed)
Requested Prescriptions  Pending Prescriptions Disp Refills   atorvastatin (LIPITOR) 10 MG tablet [Pharmacy Med Name: ATORVASTATIN '10MG'$  TABLETS] 90 tablet 1    Sig: TAKE 1 TABLET(10 MG) BY MOUTH AT BEDTIME     Cardiovascular:  Antilipid - Statins Failed - 12/24/2022  6:13 AM      Failed - Lipid Panel in normal range within the last 12 months    Cholesterol, Total  Date Value Ref Range Status  12/17/2022 164 100 - 199 mg/dL Final   LDL Cholesterol (Calc)  Date Value Ref Range Status  10/29/2021 66 mg/dL (calc) Final    Comment:    Reference range: <100 . Desirable range <100 mg/dL for primary prevention;   <70 mg/dL for patients with CHD or diabetic patients  with > or = 2 CHD risk factors. Marland Kitchen LDL-C is now calculated using the Martin-Hopkins  calculation, which is a validated novel method providing  better accuracy than the Friedewald equation in the  estimation of LDL-C.  Cresenciano Genre et al. Annamaria Helling. 8295;621(30): 2061-2068  (http://education.QuestDiagnostics.com/faq/FAQ164)    LDL Chol Calc (NIH)  Date Value Ref Range Status  12/17/2022 76 0 - 99 mg/dL Final   HDL  Date Value Ref Range Status  12/17/2022 44 >39 mg/dL Final   Triglycerides  Date Value Ref Range Status  12/17/2022 272 (H) 0 - 149 mg/dL Final         Passed - Patient is not pregnant      Passed - Valid encounter within last 12 months    Recent Outpatient Visits           1 week ago Type 2 diabetes mellitus with hyperglycemia, without long-term current use of insulin [facility])   [facility] [place], Dionne Bucy, MD   3 months ago Type 2 diabetes mellitus with hyperglycemia, without long-term current use of insulin [facility])   [facility] [place], Dionne Bucy, MD   6 months ago Hypertension associated with diabetes [facility])   [place], Dionne Bucy, MD   10 months ago Type 2 diabetes mellitus with hyperglycemia, without long-term current use of insulin [facility])    [facility] Teodora Medici, DO   1 year ago Type 2 diabetes mellitus with hyperglycemia, without long-term current use of insulin [facility])   [facility] Myles Gip, DO       Future Appointments             In 3 months Philip Aspen, [place] OBGYN

## 2022-12-26 DIAGNOSIS — M19042 Primary osteoarthritis, left hand: Secondary | ICD-10-CM | POA: Diagnosis not present

## 2022-12-26 DIAGNOSIS — M0609 Rheumatoid arthritis without rheumatoid factor, multiple sites: Secondary | ICD-10-CM | POA: Diagnosis not present

## 2022-12-26 DIAGNOSIS — Z79899 Other long term (current) drug therapy: Secondary | ICD-10-CM | POA: Diagnosis not present

## 2022-12-26 DIAGNOSIS — R7989 Other specified abnormal findings of blood chemistry: Secondary | ICD-10-CM | POA: Diagnosis not present

## 2022-12-26 DIAGNOSIS — M19041 Primary osteoarthritis, right hand: Secondary | ICD-10-CM | POA: Diagnosis not present

## 2022-12-30 DIAGNOSIS — J310 Chronic rhinitis: Secondary | ICD-10-CM | POA: Diagnosis not present

## 2022-12-30 DIAGNOSIS — J343 Hypertrophy of nasal turbinates: Secondary | ICD-10-CM | POA: Diagnosis not present

## 2022-12-30 DIAGNOSIS — J3489 Other specified disorders of nose and nasal sinuses: Secondary | ICD-10-CM | POA: Diagnosis not present

## 2023-01-11 ENCOUNTER — Other Ambulatory Visit: Payer: Self-pay | Admitting: Family Medicine

## 2023-01-11 DIAGNOSIS — E1165 Type 2 diabetes mellitus with hyperglycemia: Secondary | ICD-10-CM

## 2023-01-13 NOTE — Telephone Encounter (Signed)
Requested Prescriptions  Pending Prescriptions Disp Refills   JARDIANCE 25 MG TABS tablet [Pharmacy Med Name: JARDIANCE '25MG'$  TABLETS] 90 tablet 0    Sig: TAKE 1 TABLET(25 MG) BY MOUTH DAILY BEFORE BREAKFAST     Endocrinology:  Diabetes - SGLT2 Inhibitors Passed - 01/11/2023 10:26 AM      Passed - Cr in normal range and within 360 days    Creat  Date Value Ref Range Status  02/06/2022 0.93 0.50 - 1.03 mg/dL Final   Creatinine, Ser  Date Value Ref Range Status  12/17/2022 0.75 0.57 - 1.00 mg/dL Final   Creatinine, Urine  Date Value Ref Range Status  10/29/2021 98 20 - 275 mg/dL Final         Passed - HBA1C is between 0 and 7.9 and within 180 days    Hemoglobin A1C  Date Value Ref Range Status  12/17/2022 7.6 (A) 4.0 - 5.6 % Final  08/14/2022 7.1  Final         Passed - eGFR in normal range and within 360 days    GFR, Est African American  Date Value Ref Range Status  09/07/2020 96 > OR = 60 mL/min/1.16m Final   GFR, Est Non African American  Date Value Ref Range Status  09/07/2020 83 > OR = 60 mL/min/1.7104mFinal   eGFR  Date Value Ref Range Status  12/17/2022 94 >59 mL/min/1.73 Final         Passed - Valid encounter within last 6 months    Recent Outpatient Visits           3 weeks ago Type 2 diabetes mellitus with hyperglycemia, without long-term current use of insulin ([place]CT[place]  CoL[place] PieraSpragueAnDionne BucyMD   4 months ago Type 2 diabetes mellitus with hyperglycemia, without long-term current use of insulin (HCD[place]  CoBox ButteaEverettAnDionne BucyMD   7 months ago Hypertension associated with diabetes ([facility]  CoAgency VillageaHillsboroAnDionne BucyMD   11 months ago Type 2 diabetes mellitus with hyperglycemia, without long-term current use of insulin ([facility]  CoLake Sherwood Medical CenternTeodora MediciDO   1 year ago Type 2 diabetes mellitus with hyperglycemia,  without long-term current use of insulin ([facility]  CoLeague City Medical CenteruMyles GipDO       Future Appointments             In 2 months ThPhilip AspenCNLosantvilleB/GY[facility] at [facility]           metFORMIN (GLUCOPHAGE-XR) 500 MG 24 hr tablet [Pharmacy Med Name: METFORMIN ER '500MG'$  24HR TABS] 360 tablet 0    Sig: TAKE 2 TABLETS(1000 MG) BY MOUTH TWICE DAILY     Endocrinology:  Diabetes - Biguanides Failed - 01/11/2023 10:26 AM      Failed - B12 Level in normal range and within 720 days    No results found for: "VITAMINB12"       Failed - CBC within normal limits and completed in the last 12 months    WBC  Date Value Ref Range Status  09/07/2020 4.8 3.8 - 10.8 Thousand/uL Final   RBC  Date Value Ref Range Status  09/07/2020 3.98 3.80 - 5.10 Million/uL Final   Hemoglobin  Date Value Ref Range Status  09/07/2020 12.3 11.7 - 15.5 g/dL Final   HCT  Date Value Ref Range  Status  09/07/2020 37.2 35.0 - 45.0 % Final   MCHC  Date Value Ref Range Status  09/07/2020 33.1 32.0 - 36.0 g/dL Final   [facility]  Date Value Ref Range Status  09/07/2020 30.9 27.0 - 33.0 pg Final   MCV  Date Value Ref Range Status  09/07/2020 93.5 80.0 - 100.0 fL Final   No results found for: "PLTCOUNTKUC", "LABPLAT", "POCPLA" RDW  Date Value Ref Range Status  09/07/2020 13.4 11.0 - 15.0 % Final         Passed - Cr in normal range and within 360 days    Creat  Date Value Ref Range Status  02/06/2022 0.93 0.50 - 1.03 mg/dL Final   Creatinine, Ser  Date Value Ref Range Status  12/17/2022 0.75 0.57 - 1.00 mg/dL Final   Creatinine, Urine  Date Value Ref Range Status  10/29/2021 98 20 - 275 mg/dL Final         Passed - HBA1C is between 0 and 7.9 and within 180 days    Hemoglobin A1C  Date Value Ref Range Status  12/17/2022 7.6 (A) 4.0 - 5.6 % Final  08/14/2022 7.1  Final         Passed - eGFR in normal range and within 360 days    GFR, Est African  American  Date Value Ref Range Status  09/07/2020 96 > OR = 60 mL/min/1.2m Final   GFR, Est Non African American  Date Value Ref Range Status  09/07/2020 83 > OR = 60 mL/min/1.775mFinal   eGFR  Date Value Ref Range Status  12/17/2022 94 >59 mL/min/1.73 Final         Passed - Valid encounter within last 6 months    Recent Outpatient Visits           3 weeks ago Type 2 diabetes mellitus with hyperglycemia, without long-term current use of insulin (HCRand[place]  CoOasisaNorridgeAnDionne BucyMD   4 months ago Type 2 diabetes mellitus with hyperglycemia, without long-term current use of insulin ([facility]  CoBlevinsaPetaluma CenterAnDionne BucyMD   7 months ago Hypertension associated with diabetes ([facility]  CoTuckeraSelmaAnDionne BucyMD   11 months ago Type 2 diabetes mellitus with hyperglycemia, without long-term current use of insulin ([facility]  CoCarver Medical CenternTeodora MediciDO   1 year ago Type 2 diabetes mellitus with hyperglycemia, without long-term current use of insulin ([facility]  CoLockington Medical CenteruMyles GipDO       Future Appointments             In 2 months ThPhilip AspenCNHomesteadB/GY[facility]

## 2023-01-16 ENCOUNTER — Other Ambulatory Visit: Payer: Self-pay | Admitting: Family Medicine

## 2023-01-16 DIAGNOSIS — E1165 Type 2 diabetes mellitus with hyperglycemia: Secondary | ICD-10-CM

## 2023-01-23 ENCOUNTER — Other Ambulatory Visit: Payer: Self-pay | Admitting: Psychiatry

## 2023-01-29 ENCOUNTER — Telehealth: Payer: Self-pay | Admitting: Family Medicine

## 2023-01-29 NOTE — Telephone Encounter (Signed)
[facility] faxed refill request for the following medications:  LINZESS 290 MCG CAPS capsule    Please advise.

## 2023-01-30 MED ORDER — LINACLOTIDE 290 MCG PO CAPS: ORAL_CAPSULE | ORAL | 0 refills | Status: DC

## 2023-02-20 ENCOUNTER — Ambulatory Visit
Admission: RE | Admit: 2023-02-20 | Discharge: 2023-02-20 | Disposition: A | Discharge status: Home / Self Care | Payer: 59 | Source: Ambulatory Visit | Attending: Family Medicine | Admitting: Family Medicine

## 2023-02-20 DIAGNOSIS — Z1231 Encounter for screening mammogram for malignant neoplasm of breast: Secondary | ICD-10-CM | POA: Diagnosis not present

## 2023-02-21 NOTE — Progress Notes (Signed)
Please, let pt know that her mammogram results showed no evidence of malignancy.

## 2023-02-25 ENCOUNTER — Other Ambulatory Visit: Payer: Self-pay | Admitting: Internal Medicine

## 2023-02-25 DIAGNOSIS — E611 Iron deficiency: Secondary | ICD-10-CM

## 2023-02-25 NOTE — Telephone Encounter (Signed)
Requested medication (s) are due for refill today - yes  Requested medication (s) are on the active medication list -yes  Future visit scheduled -yes  Last refill: 02/26/22 #90 3RF  Notes to clinic: fails lab refill protocol- over 1 year- 09/07/20  Requested Prescriptions  Pending Prescriptions Disp Refills   ferrous sulfate 324 (65 Fe) MG TBEC [Pharmacy Med Name: [place] 324MG  EC TABS RED] 90 tablet 3    Sig: TAKE 1 TABLET BY MOUTH DAILY     Endocrinology:  Minerals - Iron Supplementation Failed - 02/25/2023  5:47 AM      Failed - HGB in normal range and within 360 days    Hemoglobin  Date Value Ref Range Status  09/07/2020 12.3 11.7 - 15.5 g/dL Final         Failed - HCT in normal range and within 360 days    HCT  Date Value Ref Range Status  09/07/2020 37.2 35.0 - 45.0 % Final         Failed - RBC in normal range and within 360 days    RBC  Date Value Ref Range Status  09/07/2020 3.98 3.80 - 5.10 Million/uL Final         Failed - Fe (serum) in normal range and within 360 days    No results found for: "IRON", "IRONPCTSAT"       Failed - Ferritin in normal range and within 360 days    No results found for: "FERRITIN"       Passed - Valid encounter within last 12 months    Recent Outpatient Visits           2 months ago Type 2 diabetes mellitus with hyperglycemia, without long-term current use of insulin ([place])   [place], Dionne Bucy, MD   5 months ago Type 2 diabetes mellitus with hyperglycemia, without long-term current use of insulin [facility])   [place], Dionne Bucy, MD   8 months ago Hypertension associated with diabetes [facility])   [place], Dionne Bucy, MD   1 year ago Type 2 diabetes mellitus with hyperglycemia, without long-term current use of insulin [facility])   [facility] Teodora Medici, DO   1 year ago Type 2  diabetes mellitus with hyperglycemia, without long-term current use of insulin [facility])   [place], Ray, DO       Future Appointments             In 3 weeks Bacigalupo, Dionne Bucy, MD [facility] [place]   In 1 month Philip Aspen, [place] [facility] at [facility] Prescriptions  Pending Prescriptions Disp Refills   ferrous sulfate 324 (65 Fe) MG TBEC [Pharmacy Med Name: FERROUS SULFATE 324MG  EC TABS RED] 90 tablet 3    Sig: TAKE 1 TABLET BY MOUTH DAILY     Endocrinology:  Minerals - Iron Supplementation Failed - 02/25/2023  5:47 AM      Failed - HGB in normal range and within 360 days    Hemoglobin  Date Value Ref Range Status  09/07/2020 12.3 11.7 - 15.5 g/dL Final         Failed - HCT in normal range and within 360 days    HCT  Date Value Ref Range Status  09/07/2020 37.2  35.0 - 45.0 % Final         Failed - RBC in normal range and within 360 days    RBC  Date Value Ref Range Status  09/07/2020 3.98 3.80 - 5.10 Million/uL Final         Failed - Fe (serum) in normal range and within 360 days    No results found for: "IRON", "IRONPCTSAT"       Failed - Ferritin in normal range and within 360 days    No results found for: "FERRITIN"       Passed - Valid encounter within last 12 months    Recent Outpatient Visits           2 months ago Type 2 diabetes mellitus with hyperglycemia, without long-term current use of insulin [facility])   [place], Dionne Bucy, MD   5 months ago Type 2 diabetes mellitus with hyperglycemia, without long-term current use of insulin [facility])   [place], Dionne Bucy, MD   8 months ago Hypertension associated with diabetes [facility])   [place], Dionne Bucy, MD   1 year ago Type 2 diabetes mellitus with hyperglycemia, without long-term  current use of insulin [facility])   [facility] Teodora Medici, DO   1 year ago Type 2 diabetes mellitus with hyperglycemia, without long-term current use of insulin [facility])   [facility] Myles Gip, DO       Future Appointments             In 3 weeks Bacigalupo, Dionne Bucy, MD [facility] [place]   In 1 month Philip Aspen, [place] [facility] at [facility]

## 2023-03-04 ENCOUNTER — Telehealth: Payer: Self-pay | Admitting: Family Medicine

## 2023-03-04 NOTE — Telephone Encounter (Signed)
Contacted Elizabeth [name] to schedule their annual wellness visit. Appointment made for 04/28/2023.  [place] Direct Dial: [phone]

## 2023-03-21 NOTE — Progress Notes (Unsigned)
Established patient visit   Patient: Elizabeth [name]   DOB: [birth year]   57 y.o. Female  MRN: [id number] Visit Date: 03/24/2023  Today's healthcare provider: Shirlee Latch, MD   No chief complaint on file.  Subjective    HPI  Diabetes Mellitus Type II, follow-up  Lab Results  Component Value Date   HGBA1C 7.6 (A) 12/17/2022   HGBA1C 7.1 08/14/2022   HGBA1C 7.8 (A) 06/13/2022   Last seen for diabetes 3 months ago.  Management since then includes add ozempic.  She reports {excellent/good/fair/poor:19665} compliance with treatment. She {is/is not:21021397} having side effects. {document side effects if present:1}  Home blood sugar records: {diabetes glucometry results:16657}  Episodes of hypoglycemia? {Yes/No:20286} {enter details if yes:1}   Current insulin regiment: none Most Recent Eye Exam: utd  --------------------------------------------------------------------------------------------------- Hypertension, follow-up  BP Readings from Last 3 Encounters:  12/17/22 117/73  09/13/22 107/74  07/08/22 (!) 137/99   Wt Readings from Last 3 Encounters:  12/17/22 219 lb (99.3 kg)  09/13/22 221 lb (100.2 kg)  07/08/22 221 lb (100.2 kg)     She was last seen for hypertension 3 months ago.  BP at that visit was 117/73. Management since that visit includes no changes. She reports {excellent/good/fair/poor:19665} compliance with treatment. She {is/is not:9024} having side effects. {document side effects if present:1}  Outside blood pressures are {enter patient reported home BP, or 'not being checked':1}.  --------------------------------------------------------------------------------------------------- Lipid/Cholesterol, follow-up  Last Lipid Panel: Lab Results  Component Value Date   CHOL 164 12/17/2022   LDLCALC 76 12/17/2022   HDL 44 12/17/2022   TRIG 272 (H) 12/17/2022    She was last seen for this 3 months ago.  Management since that visit  includes no chagnes.  She reports {excellent/good/fair/poor:19665} compliance with treatment. She {is/is not:9024} having side effects. {document side effects if present:1}  Last metabolic panel Lab Results  Component Value Date   GLUCOSE 152 (H) 12/17/2022   NA 142 12/17/2022   K 5.3 (H) 12/17/2022   BUN 11 12/17/2022   CREATININE 0.75 12/17/2022   EGFR 94 12/17/2022   GFRNONAA 83 09/07/2020   CALCIUM 9.7 12/17/2022   AST 30 12/17/2022   ALT 34 (H) 12/17/2022   The 10-year ASCVD risk score (Arnett DK, et al., 2019) is: 2.4%  ---------------------------------------------------------------------------------------------------   Medications: Outpatient Medications Prior to Visit  Medication Sig   Accu-Chek FastClix Lancets MISC USE TO CHECK BLOOD SUGAR TWICE DAILY   ammonium lactate (AMLACTIN) 12 % lotion Apply 1 application. topically as needed for dry skin.   aspirin 81 MG tablet Take 81 mg by mouth daily.   atorvastatin (LIPITOR) 10 MG tablet TAKE 1 TABLET(10 MG) BY MOUTH AT BEDTIME   azelastine (ASTELIN) 0.1 % nasal spray Place 2 sprays into both nostrils 2 (two) times daily. Use in each nostril as directed   B-D INS SYR ULTRAFINE 1CC/31G 31G X 5/16" 1 ML MISC    Blood Glucose Monitoring Suppl (ACCU-CHEK GUIDE) w/Device KIT AS DIRECTED EVERY DAY   cholecalciferol (VITAMIN D) 1000 units tablet Take 1,000 Units by mouth daily.   clobetasol ointment (TEMOVATE) 0.05 % APPLY EXTERNALLY TO THE AFFECTED AREA TWICE DAILY   empagliflozin (JARDIANCE) 25 MG TABS tablet TAKE 1 TABLET(25 MG) BY MOUTH DAILY BEFORE BREAKFAST   ferrous sulfate 324 (65 Fe) MG TBEC TAKE 1 TABLET BY MOUTH DAILY   fluticasone (FLONASE) 50 MCG/ACT nasal spray Place into both nostrils daily.   folic acid (FOLVITE) 1 MG tablet  Take 1 mg by mouth daily.   glucose blood (ACCU-CHEK GUIDE) test strip USE TO CHECK BLOOD SUGARS AS DIRECTED BY DOCTOR TWICE DAILY   linaclotide (LINZESS) 290 MCG CAPS capsule TAKE 1  CAPSULE(290 MCG) BY MOUTH DAILY   melatonin 5 MG TABS Take 5 mg by mouth at bedtime as needed.   metFORMIN (GLUCOPHAGE-XR) 500 MG 24 hr tablet TAKE 2 TABLETS(1000 MG) BY MOUTH TWICE DAILY   Methotrexate Sodium (METHOTREXATE, PF,) 50 MG/2ML injection 15 mg once a week.   Multiple Vitamins-Minerals (MULTIVITAMIN/EXTRA VITAMIN D3 PO) Take by mouth.   Omega-3 Fatty Acids (FISH OIL PO) Take by mouth.   OVER THE COUNTER MEDICATION once daily Herbal Name: Goli   tiZANidine (ZANAFLEX) 4 MG tablet Take 1 tablet (4 mg total) by mouth at bedtime as needed for muscle spasms.   Vitamin A 2400 MCG (8000 UT) TABS Take by mouth.   vitamin B-12 (CYANOCOBALAMIN) 1000 MCG tablet Take 1,000 mcg by mouth daily.   Zinc 22.5 MG TABS Take by mouth.   No facility-administered medications prior to visit.    Review of Systems  Constitutional:  Negative for appetite change and fatigue.  Eyes:  Negative for visual disturbance.  Respiratory:  Negative for cough, chest tightness and shortness of breath.   Cardiovascular:  Negative for chest pain and leg swelling.  Gastrointestinal:  Negative for abdominal pain, nausea and vomiting.  Neurological:  Negative for dizziness, light-headedness and headaches.    {Labs  Heme  Chem  Endocrine  Serology  Results Review (optional):23779}   Objective    LMP 07/09/2014  BP Readings from Last 3 Encounters:  12/17/22 117/73  09/13/22 107/74  07/08/22 (!) 137/99   Wt Readings from Last 3 Encounters:  12/17/22 219 lb (99.3 kg)  09/13/22 221 lb (100.2 kg)  07/08/22 221 lb (100.2 kg)      Physical Exam  ***  No results found for any visits on 03/24/23.  Assessment & Plan     ***  No follow-ups on file.      {provider attestation***:1}   Shirlee Latch, MD  [facility] [phone] (phone) [phone] (fax)  [facility]

## 2023-03-24 ENCOUNTER — Ambulatory Visit (INDEPENDENT_AMBULATORY_CARE_PROVIDER_SITE_OTHER): Payer: 59 | Admitting: Family Medicine

## 2023-03-24 VITALS — BP 118/79 | HR 85 | Temp 97.8°F | Resp 12 | Wt 219.0 lb

## 2023-03-24 DIAGNOSIS — I152 Hypertension secondary to endocrine disorders: Secondary | ICD-10-CM

## 2023-03-24 DIAGNOSIS — E785 Hyperlipidemia, unspecified: Secondary | ICD-10-CM

## 2023-03-24 DIAGNOSIS — E669 Obesity, unspecified: Secondary | ICD-10-CM

## 2023-03-24 DIAGNOSIS — E1159 Type 2 diabetes mellitus with other circulatory complications: Secondary | ICD-10-CM | POA: Diagnosis not present

## 2023-03-24 DIAGNOSIS — Z6834 Body mass index (BMI) 34.0-34.9, adult: Secondary | ICD-10-CM

## 2023-03-24 DIAGNOSIS — E1165 Type 2 diabetes mellitus with hyperglycemia: Secondary | ICD-10-CM | POA: Diagnosis not present

## 2023-03-24 DIAGNOSIS — E1169 Type 2 diabetes mellitus with other specified complication: Secondary | ICD-10-CM | POA: Diagnosis not present

## 2023-03-24 LAB — POCT GLYCOSYLATED HEMOGLOBIN (HGB A1C)
Est. average glucose Bld gHb Est-mCnc: 126
Hemoglobin A1C: 6 % — AB (ref 4.0–5.6)

## 2023-03-24 MED ORDER — SEMAGLUTIDE (1 MG/DOSE) 4 MG/3ML ~~LOC~~ SOPN: 1.0000 mg | PEN_INJECTOR | SUBCUTANEOUS | 3 refills | Status: DC

## 2023-03-24 NOTE — Progress Notes (Signed)
Established patient visit   Patient: Elizabeth [name]   DOB: 02-06-1966   57 y.o. Female  MRN: [id number] Visit Date: 03/24/2023  Today's healthcare provider: Shirlee Latch, MD   Chief Complaint  Patient presents with   Diabetes    Subjective    Diabetes Pertinent negatives for hypoglycemia include no dizziness, headaches or tremors. Pertinent negatives for diabetes include no chest pain and no fatigue.    Diabetes Mellitus Type II, follow-up  Lab Results  Component Value Date   HGBA1C 6.0 (A) 03/24/2023   HGBA1C 7.6 (A) 12/17/2022   HGBA1C 7.1 08/14/2022   Last seen for diabetes 3 months ago.  Management since then includes add ozempic.  She reports excellent compliance with treatment. She is not having side effects.  She notes that her clothes have been fitting looser and that she feels like she has lost weight.  Home blood sugar records:  all less than 120  Episodes of hypoglycemia? No    Current insulin regiment: none Most Recent Eye Exam: utd   --------------------------------------------------------------------------------------------------- Hypertension, follow-up  BP Readings from Last 3 Encounters:  03/24/23 118/79  12/17/22 117/73  09/13/22 107/74   Wt Readings from Last 3 Encounters:  03/24/23 219 lb (99.3 kg)  12/17/22 219 lb (99.3 kg)  09/13/22 221 lb (100.2 kg)     She was last seen for hypertension 3 months ago.  BP at that visit was 117/73. Management since that visit includes no changes. She reports excellent compliance with treatment. She is not having side effects.   --------------------------------------------------------------------------------------------------- Lipid/Cholesterol, follow-up  Last Lipid Panel: Lab Results  Component Value Date   CHOL 164 12/17/2022   LDLCALC 76 12/17/2022   HDL 44 12/17/2022   TRIG 272 (H) 12/17/2022    She was last seen for this 3 months ago.  Management since that visit includes  no chagnes.  She reports excellent compliance with treatment. She is not having side effects.   Last metabolic panel Lab Results  Component Value Date   GLUCOSE 152 (H) 12/17/2022   NA 142 12/17/2022   K 5.3 (H) 12/17/2022   BUN 11 12/17/2022   CREATININE 0.75 12/17/2022   EGFR 94 12/17/2022   GFRNONAA 83 09/07/2020   CALCIUM 9.7 12/17/2022   AST 30 12/17/2022   ALT 34 (H) 12/17/2022   The 10-year ASCVD risk score (Arnett DK, et al., 2019) is: 3.3%  ---------------------------------------------------------------------------------------------------   Medications: Outpatient Medications Prior to Visit  Medication Sig   Accu-Chek FastClix Lancets MISC USE TO CHECK BLOOD SUGAR TWICE DAILY   ammonium lactate (AMLACTIN) 12 % lotion Apply 1 application. topically as needed for dry skin.   aspirin 81 MG tablet Take 81 mg by mouth daily.   atorvastatin (LIPITOR) 10 MG tablet TAKE 1 TABLET(10 MG) BY MOUTH AT BEDTIME   azelastine (ASTELIN) 0.1 % nasal spray Place 2 sprays into both nostrils 2 (two) times daily. Use in each nostril as directed   B-D INS SYR ULTRAFINE 1CC/31G 31G X 5/16" 1 ML MISC    Blood Glucose Monitoring Suppl (ACCU-CHEK GUIDE) w/Device KIT AS DIRECTED EVERY DAY   cholecalciferol (VITAMIN D) 1000 units tablet Take 1,000 Units by mouth daily.   clobetasol ointment (TEMOVATE) 0.05 % APPLY EXTERNALLY TO THE AFFECTED AREA TWICE DAILY   empagliflozin (JARDIANCE) 25 MG TABS tablet TAKE 1 TABLET(25 MG) BY MOUTH DAILY BEFORE BREAKFAST   ferrous sulfate 324 (65 Fe) MG TBEC TAKE 1 TABLET BY MOUTH DAILY  fluticasone (FLONASE) 50 MCG/ACT nasal spray Place into both nostrils daily.   folic acid (FOLVITE) 1 MG tablet Take 1 mg by mouth daily.   glucose blood (ACCU-CHEK GUIDE) test strip USE TO CHECK BLOOD SUGARS AS DIRECTED BY DOCTOR TWICE DAILY   linaclotide (LINZESS) 290 MCG CAPS capsule TAKE 1 CAPSULE(290 MCG) BY MOUTH DAILY   melatonin 5 MG TABS Take 5 mg by mouth at bedtime  as needed.   metFORMIN (GLUCOPHAGE-XR) 500 MG 24 hr tablet TAKE 2 TABLETS(1000 MG) BY MOUTH TWICE DAILY   Methotrexate Sodium (METHOTREXATE, PF,) 50 MG/2ML injection 15 mg once a week.   Multiple Vitamins-Minerals (MULTIVITAMIN/EXTRA VITAMIN D3 PO) Take by mouth.   Omega-3 Fatty Acids (FISH OIL PO) Take by mouth.   OVER THE COUNTER MEDICATION once daily Herbal Name: Goli   tiZANidine (ZANAFLEX) 4 MG tablet Take 1 tablet (4 mg total) by mouth at bedtime as needed for muscle spasms.   Vitamin A 2400 MCG (8000 UT) TABS Take by mouth.   vitamin B-12 (CYANOCOBALAMIN) 1000 MCG tablet Take 1,000 mcg by mouth daily.   Zinc 22.5 MG TABS Take by mouth.   No facility-administered medications prior to visit.    Review of Systems  Constitutional:  Negative for appetite change and fatigue.  Eyes:  Negative for visual disturbance.  Respiratory:  Negative for cough, chest tightness and shortness of breath.   Cardiovascular:  Negative for chest pain and leg swelling.  Gastrointestinal:  Negative for abdominal pain, nausea and vomiting.  Neurological:  Negative for dizziness, tremors, light-headedness and headaches.       Objective    BP 118/79 (BP Location: Left Arm, Patient Position: Sitting, Cuff Size: Large)   Pulse 85   Temp 97.8 F (36.6 C) (Temporal)   Resp 12   Wt 219 lb (99.3 kg)   LMP 07/09/2014   BMI 34.30 kg/m  BP Readings from Last 3 Encounters:  03/24/23 118/79  12/17/22 117/73  09/13/22 107/74     Wt Readings from Last 3 Encounters:  03/24/23 219 lb (99.3 kg)  12/17/22 219 lb (99.3 kg)  09/13/22 221 lb (100.2 kg)      Physical Exam Constitutional:      General: She is not in acute distress.    Appearance: Normal appearance.  HENT:     Head: Normocephalic.  Cardiovascular:     Pulses: Normal pulses.     Heart sounds: Normal heart sounds.  Pulmonary:     Effort: Pulmonary effort is normal.     Breath sounds: Normal breath sounds.  Musculoskeletal:     Cervical  back: Normal range of motion and neck supple.  Skin:    General: Skin is warm and dry.     Capillary Refill: Capillary refill takes less than 2 seconds.  Neurological:     Mental Status: She is alert.      Results for orders placed or performed in visit on 03/24/23  POCT glycosylated hemoglobin (Hb A1C)  Result Value Ref Range   Hemoglobin A1C 6.0 (A) 4.0 - 5.6 %   Est. average glucose Bld gHb Est-mCnc 126     Assessment & Plan   1. Type 2 diabetes mellitus with hyperglycemia, without long-term current use of insulin Chronic and well controlled. Continue current medications.  - POCT glycosylated hemoglobin (Hb A1C)  2. Hypertension associated with diabetes Chronic and well controlled. Continue current medications.   3. Hyperlipidemia associated with type 2 diabetes mellitus Chronic and well controlled.  Increased  Ozempic to 1 mg.  Ordered HgbA1C at today's visit.  Foot exam completed at today's visit.   4. Class 1 obesity without serious comorbidity with body mass index (BMI) of 34.0 to 34.9 in adult, unspecified obesity type Weight unchanged from last visit but she notes that her clothes are now looser on her body.  Ozempic increased to 1 mg.    Return in about 3 months (around 06/23/2023) for CPE.      Ezekiel Slocumb, MS3 [facility]    Patient seen along with MS3 student Ezekiel Slocumb. I personally evaluated this patient along with the student, and verified all aspects of the history, physical exam, and medical decision making as documented by the student. I agree with the student's documentation and have made all necessary edits.  Casimira Sutphin, Marzella Schlein, MD, MPH [facility]

## 2023-03-24 NOTE — Assessment & Plan Note (Signed)
Chronic and well controlled.  Increased Ozempic to 1 mg.  Ordered HgbA1C at today's visit.  Foot exam completed at today's visit.

## 2023-03-24 NOTE — Assessment & Plan Note (Signed)
Chronic and well-controlled Continue current medications 

## 2023-03-24 NOTE — Assessment & Plan Note (Signed)
Weight unchanged from last visit but she notes that her clothes are now looser on her body.  Ozempic increased to 1 mg.

## 2023-03-31 ENCOUNTER — Encounter: Payer: 59 | Admitting: Certified Nurse Midwife

## 2023-04-15 ENCOUNTER — Other Ambulatory Visit: Payer: Self-pay | Admitting: Internal Medicine

## 2023-04-15 NOTE — Telephone Encounter (Signed)
Requested Prescriptions  Pending Prescriptions Disp Refills   Accu-Chek FastClix Lancets MISC [Pharmacy Med Name: ACCU-CHEK FASTCLIX LANCETS 102'S] 100 each 0    Sig: USE TO CHECK BLOOD SUGAR TWICE DAILY     Endocrinology: Diabetes - Testing Supplies Passed - 04/15/2023  3:40 AM      Passed - Valid encounter within last 12 months    Recent Outpatient Visits           3 weeks ago Type 2 diabetes mellitus with hyperglycemia, without long-term current use of insulin (HCC)   [place] [facility] [place], Marzella Schlein, MD   3 months ago Type 2 diabetes mellitus with hyperglycemia, without long-term current use of insulin [facility])   [place] [facility] [place], Marzella Schlein, MD   7 months ago Type 2 diabetes mellitus with hyperglycemia, without long-term current use of insulin [facility])   [place] [facility] [place], Marzella Schlein, MD   10 months ago Hypertension associated with diabetes [facility])   [place] [facility] [place], Marzella Schlein, MD   1 year ago Type 2 diabetes mellitus with hyperglycemia, without long-term current use of insulin [facility])   [place] [facility] Margarita Mail, DO       Future Appointments             In 2 months Bacigalupo, Marzella Schlein, MD [facility]

## 2023-04-16 ENCOUNTER — Other Ambulatory Visit: Payer: Self-pay | Admitting: Family Medicine

## 2023-04-16 DIAGNOSIS — E1165 Type 2 diabetes mellitus with hyperglycemia: Secondary | ICD-10-CM

## 2023-04-16 NOTE — Telephone Encounter (Signed)
Requested Prescriptions  Pending Prescriptions Disp Refills   metFORMIN (GLUCOPHAGE-XR) 500 MG 24 hr tablet [Pharmacy Med Name: METFORMIN ER 500MG  24HR TABS] 360 tablet 0    Sig: TAKE 2 TABLETS(1000 MG) BY MOUTH TWICE DAILY     Endocrinology:  Diabetes - Biguanides Failed - 04/16/2023  8:51 AM      Failed - B12 Level in normal range and within 720 days    No results found for: "VITAMINB12"       Failed - CBC within normal limits and completed in the last 12 months    WBC  Date Value Ref Range Status  09/07/2020 4.8 3.8 - 10.8 Thousand/uL Final   RBC  Date Value Ref Range Status  09/07/2020 3.98 3.80 - 5.10 Million/uL Final   Hemoglobin  Date Value Ref Range Status  09/07/2020 12.3 11.7 - 15.5 g/dL Final   HCT  Date Value Ref Range Status  09/07/2020 37.2 35.0 - 45.0 % Final   MCHC  Date Value Ref Range Status  09/07/2020 33.1 32.0 - 36.0 g/dL Final   [facility]  Date Value Ref Range Status  09/07/2020 30.9 27.0 - 33.0 pg Final   MCV  Date Value Ref Range Status  09/07/2020 93.5 80.0 - 100.0 fL Final   No results found for: "PLTCOUNTKUC", "LABPLAT", "POCPLA" RDW  Date Value Ref Range Status  09/07/2020 13.4 11.0 - 15.0 % Final         Passed - Cr in normal range and within 360 days    Creat  Date Value Ref Range Status  02/06/2022 0.93 0.50 - 1.03 mg/dL Final   Creatinine, Ser  Date Value Ref Range Status  12/17/2022 0.75 0.57 - 1.00 mg/dL Final   Creatinine, Urine  Date Value Ref Range Status  10/29/2021 98 20 - 275 mg/dL Final         Passed - HBA1C is between 0 and 7.9 and within 180 days    Hemoglobin A1C  Date Value Ref Range Status  03/24/2023 6.0 (A) 4.0 - 5.6 % Final  08/14/2022 7.1  Final         Passed - eGFR in normal range and within 360 days    GFR, Est African American  Date Value Ref Range Status  09/07/2020 96 > OR = 60 mL/min/1.30m2 Final   GFR, Est Non African American  Date Value Ref Range Status  09/07/2020 83 > OR = 60 mL/min/1.20m2  Final   eGFR  Date Value Ref Range Status  12/17/2022 94 >59 mL/min/1.73 Final         Passed - Valid encounter within last 6 months    Recent Outpatient Visits           3 weeks ago Type 2 diabetes mellitus with hyperglycemia, without long-term current use of insulin (HCC)   [place] [facility] [place], Marzella Schlein, MD   4 months ago Type 2 diabetes mellitus with hyperglycemia, without long-term current use of insulin [facility])   [place] [facility] [place], Marzella Schlein, MD   7 months ago Type 2 diabetes mellitus with hyperglycemia, without long-term current use of insulin [facility])   [place] [facility] [place], Marzella Schlein, MD   10 months ago Hypertension associated with diabetes [facility])   [place] [facility] [place], Marzella Schlein, MD   1 year ago Type 2 diabetes mellitus with hyperglycemia, without long-term current use of insulin [facility])    [facility] [place],  Gentry Fitz, DO       Future Appointments             In 2 months Bacigalupo, Marzella Schlein, MD [facility]

## 2023-04-17 ENCOUNTER — Other Ambulatory Visit: Payer: Self-pay | Admitting: Internal Medicine

## 2023-04-17 ENCOUNTER — Other Ambulatory Visit: Payer: Self-pay | Admitting: Family Medicine

## 2023-04-17 NOTE — Telephone Encounter (Signed)
Last RF 04/15/23 #100 each sent to requesting pharmacy  Requested Prescriptions  Refused Prescriptions Disp Refills   Accu-Chek FastClix Lancets MISC [Pharmacy Med Name: ACCU-CHEK FASTCLIX LANCETS 102'S] 102 each     Sig: USE TO CHECK BLOOD SUGAR TWICE DAILY     Endocrinology: Diabetes - Testing Supplies Passed - 04/17/2023  8:03 AM      Passed - Valid encounter within last 12 months    Recent Outpatient Visits           3 weeks ago Type 2 diabetes mellitus with hyperglycemia, without long-term current use of insulin (HCC)   [place] [facility] [place], Marzella Schlein, MD   4 months ago Type 2 diabetes mellitus with hyperglycemia, without long-term current use of insulin [facility])   [place] [facility] [place], Marzella Schlein, MD   7 months ago Type 2 diabetes mellitus with hyperglycemia, without long-term current use of insulin [facility])   [place] [facility] [place], Marzella Schlein, MD   10 months ago Hypertension associated with diabetes [facility])   [place] [facility] [place], Marzella Schlein, MD   1 year ago Type 2 diabetes mellitus with hyperglycemia, without long-term current use of insulin [facility])   [place] [facility] Margarita Mail, DO       Future Appointments             In 2 months [place], Marzella Schlein, MD [facility]

## 2023-04-18 MED ORDER — ACCU-CHEK FASTCLIX LANCETS MISC: 0 refills | Status: AC

## 2023-04-19 ENCOUNTER — Other Ambulatory Visit: Payer: Self-pay | Admitting: Family Medicine

## 2023-04-21 NOTE — Telephone Encounter (Signed)
Requested Prescriptions  Pending Prescriptions Disp Refills   glucose blood (ACCU-CHEK GUIDE) test strip [Pharmacy Med Name: ACCU-CHEK GUIDE TEST STRIPS 50] 100 strip 0    Sig: USE TO CHECK BLOOD SUGARS AS DIRECTED BY DOCTORS TWICE DAILY     Endocrinology: Diabetes - Testing Supplies Passed - 04/19/2023  3:40 AM      Passed - Valid encounter within last 12 months    Recent Outpatient Visits           4 weeks ago Type 2 diabetes mellitus with hyperglycemia, without long-term current use of insulin (HCC)   [place] [facility] [place], Marzella Schlein, MD   4 months ago Type 2 diabetes mellitus with hyperglycemia, without long-term current use of insulin [facility])   [place] [facility] [place], Marzella Schlein, MD   7 months ago Type 2 diabetes mellitus with hyperglycemia, without long-term current use of insulin [place] [facility])   [place] [facility] [place], Marzella Schlein, MD   10 months ago Hypertension associated with diabetes [facility])   [place] [facility] [place], Marzella Schlein, MD   1 year ago Type 2 diabetes mellitus with hyperglycemia, without long-term current use of insulin [facility])   [place] [facility] Margarita Mail, DO       Future Appointments             In 2 months Bacigalupo, Marzella Schlein, MD [facility], [facility]

## 2023-04-23 ENCOUNTER — Other Ambulatory Visit: Payer: Self-pay | Admitting: Family Medicine

## 2023-04-23 DIAGNOSIS — E1165 Type 2 diabetes mellitus with hyperglycemia: Secondary | ICD-10-CM

## 2023-04-28 ENCOUNTER — Ambulatory Visit (INDEPENDENT_AMBULATORY_CARE_PROVIDER_SITE_OTHER): Payer: 59

## 2023-04-28 VITALS — Ht 66.0 in | Wt 213.0 lb

## 2023-04-28 DIAGNOSIS — Z122 Encounter for screening for malignant neoplasm of respiratory organs: Secondary | ICD-10-CM

## 2023-04-28 DIAGNOSIS — Z0000 Encounter for general adult medical examination without abnormal findings: Secondary | ICD-10-CM | POA: Diagnosis not present

## 2023-04-28 NOTE — Patient Instructions (Signed)
Elizabeth [name] , Thank you for taking time to come for your Medicare Wellness Visit. I appreciate your ongoing commitment to your health goals. Please review the following plan we discussed and let me know if I can assist you in the future.   These are the goals we discussed:  Goals      DIET - reduce carb intake     Recommend to decrease portion sizes by eating 3 small healthy meals and at least 2 healthy snacks per day.     Weight (lb) < 200 lb (90.7 kg)     Pt states she would like to lose weight with physical activity and health eating.        This is a list of the screening recommended for you and due dates:  Health Maintenance  Topic Date Due   COVID-19 Vaccine (7 - 2023-24 season) 10/22/2022   Pap Smear  05/27/2023   Eye exam for diabetics  05/22/2023   Flu Shot  07/10/2023   Hemoglobin A1C  09/23/2023   Yearly kidney function blood test for diabetes  12/18/2023   Yearly kidney health urinalysis for diabetes  12/18/2023   Mammogram  02/20/2024   Complete foot exam   03/23/2024   Medicare Annual Wellness Visit  04/27/2024   Colon Cancer Screening  05/17/2027   DTaP/Tdap/Td vaccine (4 - Td or Tdap) 11/05/2027   Hepatitis C Screening: USPSTF Recommendation to screen - Ages 32-79 yo.  Completed   HIV Screening  Completed   Zoster (Shingles) Vaccine  Completed   HPV Vaccine  Aged Out    Advanced directives: no  Conditions/risks identified: low falls risk  Next appointment: Follow up in one year for your annual wellness visit. 04/28/24 @ 3pm telephone  Preventive Care 40-64 Years, Female Preventive care refers to lifestyle choices and visits with your health care provider that can promote health and wellness. What does preventive care include? A yearly physical exam. This is also called an annual well check. Dental exams once or twice a year. Routine eye exams. Ask your health care provider how often you should have your eyes checked. Personal lifestyle choices,  including: Daily care of your teeth and gums. Regular physical activity. Eating a healthy diet. Avoiding tobacco and drug use. Limiting alcohol use. Practicing safe sex. Taking low-dose aspirin daily starting at age 37. Taking vitamin and mineral supplements as recommended by your health care provider. What happens during an annual well check? The services and screenings done by your health care provider during your annual well check will depend on your age, overall health, lifestyle risk factors, and family history of disease. Counseling  Your health care provider may ask you questions about your: Alcohol use. Tobacco use. Drug use. Emotional well-being. Home and relationship well-being. Sexual activity. Eating habits. Work and work Astron[employer]. Method of birth control. Menstrual cycle. Pregnancy history. Screening  You may have the following tests or measurements: Height, weight, and BMI. Blood pressure. Lipid and cholesterol levels. These may be checked every 5 years, or more frequently if you are over 85 years old. Skin check. Lung cancer screening. You may have this screening every year starting at age 67 if you have a 30-pack-year history of smoking and currently smoke or have quit within the past 15 years. Fecal occult blood test (FOBT) of the stool. You may have this test every year starting at age 45. Flexible sigmoidoscopy or colonoscopy. You may have a sigmoidoscopy every 5 years or a colonoscopy every 10  years starting at age 21. Hepatitis C blood test. Hepatitis B blood test. Sexually transmitted disease (STD) testing. Diabetes screening. This is done by checking your blood sugar (glucose) after you have not eaten for a while (fasting). You may have this done every 1-3 years. Mammogram. This may be done every 1-2 years. Talk to your health care provider about when you should start having regular mammograms. This may depend on whether you have a family history of  breast cancer. BRCA-related cancer screening. This may be done if you have a family history of breast, ovarian, tubal, or peritoneal cancers. Pelvic exam and Pap test. This may be done every 3 years starting at age 30. Starting at age 43, this may be done every 5 years if you have a Pap test in combination with an HPV test. Bone density scan. This is done to screen for osteoporosis. You may have this scan if you are at high risk for osteoporosis. Discuss your test results, treatment options, and if necessary, the need for more tests with your health care provider. Vaccines  Your health care provider may recommend certain vaccines, such as: Influenza vaccine. This is recommended every year. Tetanus, diphtheria, and acellular pertussis (Tdap, Td) vaccine. You may need a Td booster every 10 years. Zoster vaccine. You may need this after age 4. Pneumococcal 13-valent conjugate (PCV13) vaccine. You may need this if you have certain conditions and were not previously vaccinated. Pneumococcal polysaccharide (PPSV23) vaccine. You may need one or two doses if you smoke cigarettes or if you have certain conditions. Talk to your health care provider about which screenings and vaccines you need and how often you need them. This information is not intended to replace advice given to you by your health care provider. Make sure you discuss any questions you have with your health care provider. Document Released: 12/22/2015 Document Revised: 08/14/2016 Document Reviewed: 09/26/2015 Elsevier Interactive Patient Education  2017 [employer].    Fall Prevention in the Home Falls can cause injuries. They can happen to people of all ages. There are many things you can do to make your home safe and to help prevent falls. What can I do on the outside of my home? Regularly fix the edges of walkways and driveways and fix any cracks. Remove anything that might make you trip as you walk through a door, such as a  raised step or threshold. Trim any bushes or trees on the path to your home. Use bright outdoor lighting. Clear any walking paths of anything that might make someone trip, such as rocks or tools. Regularly check to see if handrails are loose or broken. Make sure that both sides of any steps have handrails. Any raised decks and porches should have guardrails on the edges. Have any leaves, snow, or ice cleared regularly. Use sand or salt on walking paths during winter. Clean up any spills in your garage right away. This includes oil or grease spills. What can I do in the bathroom? Use night lights. Install grab bars by the toilet and in the tub and shower. Do not use towel bars as grab bars. Use non-skid mats or decals in the tub or shower. If you need to sit down in the shower, use a plastic, non-slip stool. Keep the floor dry. Clean up any water that spills on the floor as soon as it happens. Remove soap buildup in the tub or shower regularly. Attach bath mats securely with double-sided non-slip rug tape. Do not have  throw rugs and other things on the floor that can make you trip. What can I do in the bedroom? Use night lights. Make sure that you have a light by your bed that is easy to reach. Do not use any sheets or blankets that are too big for your bed. They should not hang down onto the floor. Have a firm chair that has side arms. You can use this for support while you get dressed. Do not have throw rugs and other things on the floor that can make you trip. What can I do in the kitchen? Clean up any spills right away. Avoid walking on wet floors. Keep items that you use a lot in easy-to-reach places. If you need to reach something above you, use a strong step stool that has a grab bar. Keep electrical cords out of the way. Do not use floor polish or wax that makes floors slippery. If you must use wax, use non-skid floor wax. Do not have throw rugs and other things on the floor  that can make you trip. What can I do with my stairs? Do not leave any items on the stairs. Make sure that there are handrails on both sides of the stairs and use them. Fix handrails that are broken or loose. Make sure that handrails are as long as the stairways. Check any carpeting to make sure that it is firmly attached to the stairs. Fix any carpet that is loose or worn. Avoid having throw rugs at the top or bottom of the stairs. If you do have throw rugs, attach them to the floor with carpet tape. Make sure that you have a light switch at the top of the stairs and the bottom of the stairs. If you do not have them, ask someone to add them for you. What else can I do to help prevent falls? Wear shoes that: Do not have high heels. Have rubber bottoms. Are comfortable and fit you well. Are closed at the toe. Do not wear sandals. If you use a stepladder: Make sure that it is fully opened. Do not climb a closed stepladder. Make sure that both sides of the stepladder are locked into place. Ask someone to hold it for you, if possible. Clearly mark and make sure that you can see: Any grab bars or handrails. First and last steps. Where the edge of each step is. Use tools that help you move around (mobility aids) if they are needed. These include: Canes. Walkers. Scooters. Crutches. Turn on the lights when you go into a dark area. Replace any light bulbs as soon as they burn out. Set up your furniture so you have a clear path. Avoid moving your furniture around. If any of your floors are uneven, fix them. If there are any pets around you, be aware of where they are. Review your medicines with your doctor. Some medicines can make you feel dizzy. This can increase your chance of falling. Ask your doctor what other things that you can do to help prevent falls. This information is not intended to replace advice given to you by your health care provider. Make sure you discuss any questions you  have with your health care provider. Document Released: 09/21/2009 Document Revised: 05/02/2016 Document Reviewed: 12/30/2014 Elsevier Interactive Patient Education  2017 [employer].

## 2023-04-28 NOTE — Progress Notes (Signed)
I connected with  Elizabeth [name] on 04/28/23 by a audio enabled telemedicine application and verified that I am speaking with the correct person using two identifiers.  Patient Location: Home  Provider Location: Office/Clinic  I discussed the limitations of evaluation and management by telemedicine. The patient expressed understanding and agreed to proceed.  Subjective:   Elizabeth [name] is a 57 y.o. female who presents for Medicare Annual (Subsequent) preventive examination.  Review of Systems     Cardiac Risk Factors include: advanced age (>81men, >24 women);diabetes mellitus;dyslipidemia;hypertension;obesity (BMI >30kg/m2);sedentary lifestyle     Objective:    Today's Vitals   04/28/23 1510  Weight: 213 lb (96.6 kg)  Height: 5\' 6"  (1.676 m)   Body mass index is 34.38 kg/m.     04/28/2023    3:22 PM 04/30/2022    1:44 PM 04/16/2022    9:00 AM 04/12/2021    8:59 AM 04/10/2021    2:32 PM 03/15/2021   10:25 AM 05/16/2020    7:48 AM  Advanced Directives  Does Patient Have a Medical Advance Directive? No Yes Yes Yes Yes Yes Yes  Type of Furniture conservator/restorer;Living will Living will Healthcare Power of [place];Living will Healthcare Power of [place];Living will  Living will  Does patient want to make changes to medical advance directive?      No - Patient declined   Copy of Healthcare Power of Attorney in Chart?    No - copy requested       Current Medications (verified) Outpatient Encounter Medications as of 04/28/2023  Medication Sig   Accu-Chek FastClix Lancets MISC USE TO CHECK BLOOD SUGAR TWICE DAILY   ammonium lactate (AMLACTIN) 12 % lotion Apply 1 application. topically as needed for dry skin.   aspirin 81 MG tablet Take 81 mg by mouth daily.   atorvastatin (LIPITOR) 10 MG tablet TAKE 1 TABLET(10 MG) BY MOUTH AT BEDTIME   azelastine (ASTELIN) 0.1 % nasal spray Place 2 sprays into both nostrils 2 (two) times daily. Use in each nostril as directed    B-D INS SYR ULTRAFINE 1CC/31G 31G X 5/16" 1 ML MISC    Blood Glucose Monitoring Suppl (ACCU-CHEK GUIDE) w/Device KIT AS DIRECTED EVERY DAY   cholecalciferol (VITAMIN D) 1000 units tablet Take 1,000 Units by mouth daily.   clobetasol ointment (TEMOVATE) 0.05 % APPLY EXTERNALLY TO THE AFFECTED AREA TWICE DAILY   ferrous sulfate 324 (65 Fe) MG TBEC TAKE 1 TABLET BY MOUTH DAILY   fluticasone (FLONASE) 50 MCG/ACT nasal spray Place into both nostrils daily.   folic acid (FOLVITE) 1 MG tablet Take 1 mg by mouth daily.   glucose blood (ACCU-CHEK GUIDE) test strip USE TO CHECK BLOOD SUGARS AS DIRECTED BY DOCTORS TWICE DAILY   JARDIANCE 25 MG TABS tablet TAKE 1 TABLET(25 MG) BY MOUTH DAILY BEFORE BREAKFAST   linaclotide (LINZESS) 290 MCG CAPS capsule TAKE 1 CAPSULE(290 MCG) BY MOUTH DAILY   melatonin 5 MG TABS Take 5 mg by mouth at bedtime as needed.   metFORMIN (GLUCOPHAGE-XR) 500 MG 24 hr tablet TAKE 2 TABLETS(1000 MG) BY MOUTH TWICE DAILY   Methotrexate Sodium (METHOTREXATE, PF,) 50 MG/2ML injection 15 mg once a week.   Multiple Vitamins-Minerals (MULTIVITAMIN/EXTRA VITAMIN D3 PO) Take by mouth.   Omega-3 Fatty Acids (FISH OIL PO) Take by mouth.   OVER THE COUNTER MEDICATION once daily Herbal Name: Goli   Semaglutide, 1 MG/DOSE, 4 MG/3ML SOPN Inject 1 mg as directed once a week.  tiZANidine (ZANAFLEX) 4 MG tablet Take 1 tablet (4 mg total) by mouth at bedtime as needed for muscle spasms.   Vitamin A 2400 MCG (8000 UT) TABS Take by mouth.   vitamin B-12 (CYANOCOBALAMIN) 1000 MCG tablet Take 1,000 mcg by mouth daily.   Zinc 22.5 MG TABS Take by mouth.   No facility-administered encounter medications on file as of 04/28/2023.    Allergies (verified) Ace inhibitors, Enalapril, Gabapentin, Penicillins, and Statins   History: Past Medical History:  Diagnosis Date   Bipolar 1 disorder (HCC)    Depression    Diabetes mellitus without complication (HCC)    diverticulitis    GERD (gastroesophageal  reflux disease)    Heart murmur    followed by PCP   Hyperlipidemia    Hypertension goal BP (blood pressure) < 130/80 01/17/2016   Insomnia    Irritable bowel syndrome    Motion sickness    boats   Obesity (BMI 30-39.9) 07/03/2015   RA (rheumatoid arthritis) (HCC)    Sciatica    Past Surgical History:  Procedure Laterality Date   BOWEL RESECTION     BREAST BIOPSY Right 12/22/2019   affirm bx x marker, papilloma    BREAST BIOPSY     CHOLECYSTECTOMY  1997   [place], [place]   COLONOSCOPY WITH PROPOFOL N/A 09/07/2015   Procedure: COLONOSCOPY WITH PROPOFOL;  Surgeon: Midge Minium, MD;  [facility];  Service: Endoscopy;  Laterality: N/A;   COLONOSCOPY WITH PROPOFOL N/A 05/16/2020   Procedure: COLONOSCOPY WITH PROPOFOL;  Surgeon: Midge Minium, MD;  Location: [facility] END[facility];  Service: Endoscopy;  Laterality: N/A;   ESOPHAGOGASTRODUODENOSCOPY (EGD) WITH ESOPHAGEAL DILATION     ESOPHAGOGASTRODUODENOSCOPY (EGD) WITH PROPOFOL N/A 09/07/2015   Procedure: ESOPHAGOGASTRODUODENOSCOPY (EGD) WITH PROPOFOL;  Surgeon: Midge Minium, MD;  Location: [facility];  Service: Endoscopy;  Laterality: N/A;   PARTIAL COLECTOMY N/A 01/24/2016   Procedure: SIGMOID COLECTOMY;  Surgeon: Lattie Haw, MD;  Location: [facility] ORS;  Service: General;  Laterality: N/A;   POLYPECTOMY N/A 09/07/2015   Procedure: POLYPECTOMY;  Surgeon: Midge Minium, MD;  Location: [facility];  Service: Endoscopy;  Laterality: N/A;   TUBAL LIGATION     Family History  Problem Relation Age of Onset   Hypertension Mother    Diabetes Mother    Arthritis/Rheumatoid Mother    Hyperlipidemia Mother    Alcohol abuse Father    Esophageal varices Father    Diabetes Brother    Kidney disease Brother    Healthy Brother    Diverticulosis Maternal Grandmother    Diabetes Maternal Grandmother    Heart disease Maternal Grandmother    Dementia Maternal Grandmother    Aneurysm Maternal Grandfather    Diabetes Paternal  Grandmother    Depression Daughter        bipolar disorder   Healthy Son    Diverticulosis Maternal Aunt    Breast cancer Neg Hx    Colon cancer Neg Hx    Social History   Socioeconomic History   Marital status: Married    Spouse name: Reita Cliche   Number of children: 3   Years of education: some college   Highest education level: Some college, no degree  Occupational History   Occupation: Disabled  Tobacco Use   Smoking status: Former    Packs/day: 1.00    Years: 1.00    Additional pack years: 0.00    Total pack years: 1.00    Types: Cigarettes    Quit date:  05/06/2015    Years since quitting: 7.9   Smokeless tobacco: Never  Vaping Use   Vaping Use: Never used  Substance and Sexual Activity   Alcohol use: Yes    Comment: rarely small glass of wine, couple times per year   Drug use: No   Sexual activity: Not Currently    Partners: Male    Birth control/protection: None  Other Topics Concern   Not on file  Social History Narrative   Not on file   Social Determinants of Health   Financial Resource Strain: Low Risk  (04/28/2023)   Overall Financial Resource Strain (CARDIA)    Difficulty of Paying Living Expenses: Not very hard  Food Insecurity: No Food Insecurity (04/28/2023)   Hunger Vital Sign    Worried About Running Out of Food in the Last Year: Never true    Ran Out of Food in the Last Year: Never true  Transportation Needs: No Transportation Needs (04/28/2023)   PRAPARE - Administrator, Civil Service (Medical): No    Lack of Transportation (Non-Medical): No  Physical Activity: Inactive (04/28/2023)   Exercise Vital Sign    Days of Exercise per Week: 0 days    Minutes of Exercise per Session: 0 min  Stress: No Stress Concern Present (04/28/2023)   [employer] of Occupational Health - Occupational Stress Questionnaire    Feeling of Stress : Not at all  Social Connections: Socially Integrated (04/28/2023)   Social Connection and Isolation Panel  [NHANES]    Frequency of Communication with Friends and Family: More than three times a week    Frequency of Social Gatherings with Friends and Family: More than three times a week    Attends Religious Services: More than 4 times per year    Active Member of [employer] or Organizations: Yes    Attends Engineer, structural: More than 4 times per year    Marital Status: Married    Tobacco Counseling Counseling given: Not Answered   Clinical Intake:  Pre-visit preparation completed: Yes  Pain : No/denies pain   BMI - recorded: 34.38 Nutritional Status: BMI > 30  Obese Nutritional Risks: None Diabetes: Yes CBG done?: No Did pt. bring in CBG monitor from home?: No  How often do you need to have someone help you when you read instructions, pamphlets, or other written materials from your doctor or pharmacy?: 1 - Never  Diabetic?no  Interpreter Needed?: No  Comments: lives w/husband Information entered by :: B.Ibrahim Mcpheeters,LPN   Activities of Daily Living    04/28/2023    3:22 PM 03/24/2023    8:18 AM  In your present state of health, do you have any difficulty performing the following activities:  Hearing? 0 0  Vision? 0 0  Difficulty concentrating or making decisions? 0 0  Walking or climbing stairs? 0 0  Dressing or bathing? 0 0  Doing errands, shopping? 0 0  Preparing Food and eating ? N   Using the Toilet? N   In the past six months, have you accidently leaked urine? N   Do you have problems with loss of bowel control? N   Managing your Medications? N   Managing your Finances? N   Housekeeping or managing your Housekeeping? N     Patient Care Team: Erasmo Downer, MD as PCP - General (Family Medicine) Edward Jolly, MD as Consulting Physician (Pain Medicine) Patterson Hammersmith, MD as Consulting Physician (Rheumatology) Geanie Logan, MD as Referring Physician (Otolaryngology)  Midge Minium, MD as Consulting Physician (Gastroenterology) Doreene Burke, CNM  as Midwife (Certified Nurse Midwife) Neysa Hotter, MD as Consulting Physician (Psychiatry)  Indicate any recent Medical Services you may have received from other than [facility] providers in the past year (date may be approximate).     Assessment:   This is a routine wellness examination for [place].  Hearing/Vision screen Hearing Screening - Comments:: Adequate hearing Vision Screening - Comments:: Adequate vision [facility] Rolley Sims  Dietary issues and exercise activities discussed: Current Exercise Habits: The patient does not participate in regular exercise at present, Exercise limited by: orthopedic condition(s)   Goals Addressed             This Visit's Progress    DIET - reduce carb intake   On track    Recommend to decrease portion sizes by eating 3 small healthy meals and at least 2 healthy snacks per day.       Depression Screen    04/28/2023    3:18 PM 03/24/2023    8:18 AM 09/13/2022    8:12 AM 06/10/2022    9:01 AM 04/30/2022    1:35 PM 04/16/2022    8:59 AM 03/06/2022   10:27 AM  PHQ 2/9 Scores  PHQ - 2 Score 0 0 0 0 0 0   PHQ- 9 Score  0 4 0        Information is confidential and restricted. Go to Review Flowsheets to unlock data.    Fall Risk    04/28/2023    3:14 PM 03/24/2023    8:18 AM 09/13/2022    8:12 AM 07/08/2022    9:51 AM 06/10/2022    9:01 AM  Fall Risk   Falls in the past year? 0 0 0 0 0  Number falls in past yr: 0 0 0  0  Injury with Fall? 0 0 0  0  Risk for fall due to : No Fall Risks No Fall Risks No Fall Risks  No Fall Risks  Follow up Education provided;Falls prevention discussed Falls evaluation completed Falls evaluation completed  Falls evaluation completed    FALL RISK PREVENTION PERTAINING TO THE HOME:  Any stairs in or around the home? No  If so, are there any without handrails? No  Home free of loose throw rugs in walkways, pet beds, electrical cords, etc? No  Adequate lighting in your home to reduce risk of falls? No    ASSISTIVE DEVICES UTILIZED TO PREVENT FALLS:  Life alert? No  Use of a cane, walker or w/c? No  Grab bars in the bathroom? Yes  Shower chair or bench in shower? No  Elevated toilet seat or a handicapped toilet? Yes    Cognitive Function:        04/28/2023    3:25 PM 04/01/2019    9:12 AM 03/27/2018   10:11 AM  6CIT Screen  What Year? 0 points 0 points 0 points  What month? 0 points 0 points 0 points  What time? 0 points 0 points 0 points  Count back from 20 0 points 0 points 0 points  Months in reverse 0 points 0 points 0 points  Repeat phrase 0 points 0 points 0 points  Total Score 0 points 0 points 0 points    Immunizations Immunization History  Administered Date(s) Administered   Hepatitis A, Adult 11/12/2017, 05/12/2018   Influenza,inj,Quad PF,6+ Mos 08/06/2017, 07/14/2018, 08/10/2019, 07/25/2020, 08/23/2021, 08/02/2022   Influenza-Unspecified 08/06/2017   Moderna Covid-19 Vaccine Bivalent Booster  57yrs & up 08/23/2021   Moderna Sars-Covid-2 Vaccination 02/10/2020, 03/04/2020, 07/25/2020, 03/07/2021   PFIZER Comirnaty(Gray Top)Covid-19 Tri-Sucrose Vaccine 08/27/2022   PNEUMOCOCCAL CONJUGATE-20 02/06/2022   Pneumococcal Polysaccharide-23 11/04/2017   Td 11/04/2017   Tdap 11/29/2015, 11/04/2017   Zoster Recombinat (Shingrix) 01/08/2018, 04/01/2018    TDAP status: Up to date  Flu Vaccine status: Up to date  Pneumococcal vaccine status: Up to date  Covid-19 vaccine status: Completed vaccines  Qualifies for Shingles Vaccine? Yes   Zostavax completed Yes   Shingrix Completed?: Yes  Screening Tests Health Maintenance  Topic Date Due   COVID-19 Vaccine (7 - 2023-24 season) 10/22/2022   PAP SMEAR-Modifier  05/27/2023   OPHTHALMOLOGY EXAM  05/22/2023   INFLUENZA VACCINE  07/10/2023   HEMOGLOBIN A1C  09/23/2023   Diabetic kidney evaluation - eGFR measurement  12/18/2023   Diabetic kidney evaluation - Urine ACR  12/18/2023   MAMMOGRAM  02/20/2024   FOOT EXAM   03/23/2024   Medicare Annual Wellness (AWV)  04/27/2024   COLONOSCOPY (Pts 45-21yrs Insurance coverage will need to be confirmed)  05/17/2027   DTaP/Tdap/Td (4 - Td or Tdap) 11/05/2027   Hepatitis C Screening  Completed   HIV Screening  Completed   Zoster Vaccines- Shingrix  Completed   HPV VACCINES  Aged Out    Health Maintenance  Health Maintenance Due  Topic Date Due   COVID-19 Vaccine (7 - 2023-24 season) 10/22/2022   PAP SMEAR-Modifier  05/27/2023    Colorectal cancer screening: Type of screening: Colonoscopy. Completed yes. Repeat every 5 years  Mammogram status: Completed yes. Repeat every year  Lung Cancer Screening: (Low Dose CT Chest recommended if Age 13-80 years, 30 pack-year currently smoking OR have quit w/in 15years.) does qualify.   Lung Cancer Screening Referral: yes  Additional Screening:  Hepatitis C Screening: does not qualify; Completed yes  Vision Screening: Recommended annual ophthalmology exams for early detection of glaucoma and other disorders of the eye. Is the patient up to date with their annual eye exam?  Yes  Who is the provider or what is the name of the office in which the patient attends annual eye exams? Dr Rolley Sims If pt is not established with a provider, would they like to be referred to a provider to establish care? No .   Dental Screening: Recommended annual dental exams for proper oral hygiene  Community Resource Referral / Chronic Care Management: CRR required this visit?  No   CCM required this visit?  No      Plan:     I have personally reviewed and noted the following in the patient's chart:   Medical and social history Use of alcohol, tobacco or illicit drugs  Current medications and supplements including opioid prescriptions. Patient is not currently taking opioid prescriptions. Functional ability and status Nutritional status Physical activity Advanced directives List of other physicians Hospitalizations,  surgeries, and ER visits in previous 12 months Vitals Screenings to include cognitive, depression, and falls Referrals and appointments  In addition, I have reviewed and discussed with patient certain preventive protocols, quality metrics, and best practice recommendations. A written personalized care plan for preventive services as well as general preventive health recommendations were provided to patient.     Sue Lush, LPN   01/08/8656   Nurse Notes: The patient states she is doing well and has no concerns or questions at this time.

## 2023-04-30 ENCOUNTER — Other Ambulatory Visit: Payer: Self-pay | Admitting: Family Medicine

## 2023-04-30 NOTE — Telephone Encounter (Signed)
Requested Prescriptions  Pending Prescriptions Disp Refills   LINZESS 290 MCG CAPS capsule [Pharmacy Med Name: Karlene Einstein CAPSULES] 90 capsule 0    Sig: TAKE 1 CAPSULE(290 MCG) BY MOUTH DAILY     Gastroenterology: Irritable Bowel Syndrome Passed - 04/30/2023 10:36 AM      Passed - Valid encounter within last 12 months    Recent Outpatient Visits           1 month ago Type 2 diabetes mellitus with hyperglycemia, without long-term current use of insulin (HCC)   [place] [facility] [place], Marzella Schlein, MD   4 months ago Type 2 diabetes mellitus with hyperglycemia, without long-term current use of insulin [facility])   [place] [facility] [place], Marzella Schlein, MD   7 months ago Type 2 diabetes mellitus with hyperglycemia, without long-term current use of insulin [facility])   [place] [facility] [place], Marzella Schlein, MD   10 months ago Hypertension associated with diabetes [facility])   [place] [facility] [place], Marzella Schlein, MD   1 year ago Type 2 diabetes mellitus with hyperglycemia, without long-term current use of insulin [facility])   [place] [facility] Margarita Mail, DO       Future Appointments             In 1 month Bacigalupo, Marzella Schlein, MD [facility]

## 2023-05-01 DIAGNOSIS — M19042 Primary osteoarthritis, left hand: Secondary | ICD-10-CM | POA: Diagnosis not present

## 2023-05-01 DIAGNOSIS — M0609 Rheumatoid arthritis without rheumatoid factor, multiple sites: Secondary | ICD-10-CM | POA: Diagnosis not present

## 2023-05-01 DIAGNOSIS — M19041 Primary osteoarthritis, right hand: Secondary | ICD-10-CM | POA: Diagnosis not present

## 2023-05-01 DIAGNOSIS — Z79899 Other long term (current) drug therapy: Secondary | ICD-10-CM | POA: Diagnosis not present

## 2023-05-26 ENCOUNTER — Other Ambulatory Visit: Payer: Self-pay | Admitting: Family Medicine

## 2023-05-26 DIAGNOSIS — E611 Iron deficiency: Secondary | ICD-10-CM

## 2023-05-27 NOTE — Telephone Encounter (Signed)
Requested medication (s) are due for refill today -yes  Requested medication (s) are on the active medication list -yes  Future visit scheduled -yes  Last refill: 02/25/23 #90  Notes to clinic: fails lab protocol for refill- over 1 year-2021  Requested Prescriptions  Pending Prescriptions Disp Refills   ferrous sulfate 324 (65 Fe) MG TBEC [Pharmacy Med Name: FERROUS SULFATE 324MG  EC TABS RED] 90 tablet 0    Sig: TAKE 1 TABLET BY MOUTH DAILY     Endocrinology:  Minerals - Iron Supplementation Failed - 05/26/2023 11:52 PM      Failed - HGB in normal range and within 360 days    Hemoglobin  Date Value Ref Range Status  09/07/2020 12.3 11.7 - 15.5 g/dL Final         Failed - HCT in normal range and within 360 days    HCT  Date Value Ref Range Status  09/07/2020 37.2 35.0 - 45.0 % Final         Failed - RBC in normal range and within 360 days    RBC  Date Value Ref Range Status  09/07/2020 3.98 3.80 - 5.10 Million/uL Final         Failed - Fe (serum) in normal range and within 360 days    No results found for: "IRON", "IRONPCTSAT"       Failed - Ferritin in normal range and within 360 days    No results found for: "FERRITIN"       Passed - Valid encounter within last 12 months    Recent Outpatient Visits           2 months ago Type 2 diabetes mellitus with hyperglycemia, without long-term current use of insulin (HCC)   [place] [facility] [place], Marzella Schlein, MD   5 months ago Type 2 diabetes mellitus with hyperglycemia, without long-term current use of insulin (HCC)   [place] [facility] [place], Marzella Schlein, MD   8 months ago Type 2 diabetes mellitus with hyperglycemia, without long-term current use of insulin [facility])   [place] [facility] [place], Marzella Schlein, MD   11 months ago Hypertension associated with diabetes [facility])   [place] [facility] [place], Marzella Schlein, MD   1 year ago Type 2  diabetes mellitus with hyperglycemia, without long-term current use of insulin [facility])   [place] [facility] Margarita Mail, DO       Future Appointments             In 3 weeks Bacigalupo, Marzella Schlein, MD [facility]               Requested Prescriptions  Pending Prescriptions Disp Refills   ferrous sulfate 324 (65 Fe) MG TBEC [Pharmacy Med Name: FERROUS SULFATE 324MG  EC TABS RED] 90 tablet 0    Sig: TAKE 1 TABLET BY MOUTH DAILY     Endocrinology:  Minerals - Iron Supplementation Failed - 05/26/2023 11:52 PM      Failed - HGB in normal range and within 360 days    Hemoglobin  Date Value Ref Range Status  09/07/2020 12.3 11.7 - 15.5 g/dL Final         Failed - HCT in normal range and within 360 days    HCT  Date Value Ref Range Status  09/07/2020 37.2 35.0 - 45.0 % Final         Failed - RBC in normal  range and within 360 days    RBC  Date Value Ref Range Status  09/07/2020 3.98 3.80 - 5.10 Million/uL Final         Failed - Fe (serum) in normal range and within 360 days    No results found for: "IRON", "IRONPCTSAT"       Failed - Ferritin in normal range and within 360 days    No results found for: "FERRITIN"       Passed - Valid encounter within last 12 months    Recent Outpatient Visits           2 months ago Type 2 diabetes mellitus with hyperglycemia, without long-term current use of insulin [facility])   [place] [facility] [place], Marzella Schlein, MD   5 months ago Type 2 diabetes mellitus with hyperglycemia, without long-term current use of insulin [facility])   [place] [facility] [place], Marzella Schlein, MD   8 months ago Type 2 diabetes mellitus with hyperglycemia, without long-term current use of insulin [facility])   [place] [facility] [place], Marzella Schlein, MD   11 months ago Hypertension associated with diabetes [facility])   [place] [facility]  [place], Marzella Schlein, MD   1 year ago Type 2 diabetes mellitus with hyperglycemia, without long-term current use of insulin [facility])   [place] [facility] Margarita Mail, DO       Future Appointments             In 3 weeks Bacigalupo, Marzella Schlein, MD [facility], [facility]

## 2023-05-29 DIAGNOSIS — L720 Epidermal cyst: Secondary | ICD-10-CM | POA: Diagnosis not present

## 2023-05-29 DIAGNOSIS — H2513 Age-related nuclear cataract, bilateral: Secondary | ICD-10-CM | POA: Diagnosis not present

## 2023-05-29 DIAGNOSIS — E119 Type 2 diabetes mellitus without complications: Secondary | ICD-10-CM | POA: Diagnosis not present

## 2023-05-29 LAB — HM DIABETES EYE EXAM

## 2023-06-10 ENCOUNTER — Telehealth: Payer: Self-pay | Admitting: Family Medicine

## 2023-06-10 DIAGNOSIS — H5213 Myopia, bilateral: Secondary | ICD-10-CM | POA: Diagnosis not present

## 2023-06-10 NOTE — Telephone Encounter (Signed)
[facility] pharmacy is requesting prescription refill glucose blood (ACCU-CHEK GUIDE) test strip  Please advise

## 2023-06-11 MED ORDER — ACCU-CHEK GUIDE VI STRP: ORAL_STRIP | 3 refills | Status: DC

## 2023-06-17 ENCOUNTER — Other Ambulatory Visit: Payer: Self-pay | Admitting: Internal Medicine

## 2023-06-17 DIAGNOSIS — E785 Hyperlipidemia, unspecified: Secondary | ICD-10-CM

## 2023-06-17 NOTE — Telephone Encounter (Signed)
Requested Prescriptions  Pending Prescriptions Disp Refills   atorvastatin (LIPITOR) 10 MG tablet [Pharmacy Med Name: ATORVASTATIN 10MG  TABLETS] 90 tablet 1    Sig: TAKE 1 TABLET(10 MG) BY MOUTH AT BEDTIME     Cardiovascular:  Antilipid - Statins Failed - 06/17/2023 12:46 AM      Failed - Lipid Panel in normal range within the last 12 months    Cholesterol, Total  Date Value Ref Range Status  12/17/2022 164 100 - 199 mg/dL Final   LDL Cholesterol (Calc)  Date Value Ref Range Status  10/29/2021 66 mg/dL (calc) Final    Comment:    Reference range: <100 . Desirable range <100 mg/dL for primary prevention;   <70 mg/dL for patients with CHD or diabetic patients  with > or = 2 CHD risk factors. Marland Kitchen LDL-C is now calculated using the Martin-Hopkins  calculation, which is a validated novel method providing  better accuracy than the Friedewald equation in the  estimation of LDL-C.  Horald Pollen et al. Lenox Ahr. 1610;960(45): 2061-2068  (http://education.[employer].com/faq/FAQ164)    LDL Chol Calc (NIH)  Date Value Ref Range Status  12/17/2022 76 0 - 99 mg/dL Final   HDL  Date Value Ref Range Status  12/17/2022 44 >39 mg/dL Final   Triglycerides  Date Value Ref Range Status  12/17/2022 272 (H) 0 - 149 mg/dL Final         Passed - Patient is not pregnant      Passed - Valid encounter within last 12 months    Recent Outpatient Visits           2 months ago Type 2 diabetes mellitus with hyperglycemia, without long-term current use of insulin (HCC)   [place] [facility] [place], Marzella Schlein, MD   6 months ago Type 2 diabetes mellitus with hyperglycemia, without long-term current use of insulin [facility])   [place] [facility] [place], Marzella Schlein, MD   9 months ago Type 2 diabetes mellitus with hyperglycemia, without long-term current use of insulin [facility])   [place] [facility] [place], Marzella Schlein, MD   1 year ago Hypertension  associated with diabetes [facility])   [place] [facility] [place], Marzella Schlein, MD   1 year ago Type 2 diabetes mellitus with hyperglycemia, without long-term current use of insulin [facility])   [place] [facility] Margarita Mail, DO       Future Appointments             In 6 days Bacigalupo, Marzella Schlein, MD [facility], [facility]

## 2023-06-23 ENCOUNTER — Ambulatory Visit (INDEPENDENT_AMBULATORY_CARE_PROVIDER_SITE_OTHER): Payer: 59 | Admitting: Family Medicine

## 2023-06-23 ENCOUNTER — Encounter: Payer: Self-pay | Admitting: Family Medicine

## 2023-06-23 VITALS — BP 113/77 | HR 88 | Temp 98.0°F | Resp 14 | Ht 66.0 in | Wt 207.6 lb

## 2023-06-23 DIAGNOSIS — E1169 Type 2 diabetes mellitus with other specified complication: Secondary | ICD-10-CM

## 2023-06-23 DIAGNOSIS — I152 Hypertension secondary to endocrine disorders: Secondary | ICD-10-CM

## 2023-06-23 DIAGNOSIS — E785 Hyperlipidemia, unspecified: Secondary | ICD-10-CM | POA: Diagnosis not present

## 2023-06-23 DIAGNOSIS — E1165 Type 2 diabetes mellitus with hyperglycemia: Secondary | ICD-10-CM | POA: Diagnosis not present

## 2023-06-23 DIAGNOSIS — E1159 Type 2 diabetes mellitus with other circulatory complications: Secondary | ICD-10-CM

## 2023-06-23 MED ORDER — METFORMIN HCL ER 500 MG PO TB24: 1000.0000 mg | ORAL_TABLET | Freq: Two times a day (BID) | ORAL | 1 refills | Status: DC

## 2023-06-23 MED ORDER — ATORVASTATIN CALCIUM 10 MG PO TABS: ORAL_TABLET | ORAL | 1 refills | Status: DC

## 2023-06-23 MED ORDER — EMPAGLIFLOZIN 25 MG PO TABS: 25.0000 mg | ORAL_TABLET | Freq: Every day | ORAL | 1 refills | Status: DC

## 2023-06-23 MED ORDER — SEMAGLUTIDE (1 MG/DOSE) 4 MG/3ML ~~LOC~~ SOPN: 1.0000 mg | PEN_INJECTOR | SUBCUTANEOUS | 3 refills | Status: DC

## 2023-06-23 NOTE — Assessment & Plan Note (Addendum)
Well controlled. Blood pressure today was 113/77.  Continue to monitor

## 2023-06-23 NOTE — Progress Notes (Signed)
Established Patient Office Visit  Subjective   Patient ID: Elizabeth [name], female    DOB: February 19, 1966  Age: 57 y.o. MRN: [id number]  Chief Complaint  Patient presents with   Diabetes   Hypertension   Hyperlipidemia    The patient is here today for follow up for management of her DM and DM associated hyperlipidemia and hypertension. She says she has been doing fine since her last visit. She recently started on Ozempic. She said that it has been going fine and that she has had no side effects associated with it. She denies any episodes of feeling like her blood sugar is too high or too low. She says she sometimes checks her morning blood sugar and the value has been good ranging from 97 - 110.   She is still taking her statin for her hyperlipidemia.  She has no major questions or concerns today.  Diabetes Pertinent negatives for hypoglycemia include no headaches or tremors.  Hypertension Pertinent negatives include no headaches.  Hyperlipidemia   Review of Systems  Gastrointestinal:  Negative for abdominal pain, constipation, nausea and vomiting.  Neurological:  Negative for tremors and headaches.      Objective:     LMP 07/09/2014  BP Readings from Last 3 Encounters:  06/23/23 113/77  03/24/23 118/79  12/17/22 117/73   Wt Readings from Last 3 Encounters:  06/23/23 207 lb 9.6 oz (94.2 kg)  04/28/23 213 lb (96.6 kg)  03/24/23 219 lb (99.3 kg)    Physical Exam Constitutional:      Appearance: Normal appearance.  HENT:     Head: Normocephalic and atraumatic.  Skin:    General: Skin is warm and dry.  Neurological:     General: No focal deficit present.     Mental Status: She is alert and oriented to person, place, and time.      No results found for any visits on 06/23/23.  Last metabolic panel Lab Results  Component Value Date   GLUCOSE 152 (H) 12/17/2022   NA 142 12/17/2022   K 5.3 (H) 12/17/2022   CL 106 12/17/2022   CO2 19 (L) 12/17/2022   BUN 11  12/17/2022   CREATININE 0.75 12/17/2022   EGFR 94 12/17/2022   CALCIUM 9.7 12/17/2022   PROT 6.2 12/17/2022   ALBUMIN 4.1 12/17/2022   LABGLOB 2.1 12/17/2022   AGRATIO 2.0 12/17/2022   BILITOT 0.3 12/17/2022   ALKPHOS 84 12/17/2022   AST 30 12/17/2022   ALT 34 (H) 12/17/2022   ANIONGAP 6 12/05/2017   Last lipids Lab Results  Component Value Date   CHOL 164 12/17/2022   HDL 44 12/17/2022   LDLCALC 76 12/17/2022   TRIG 272 (H) 12/17/2022   CHOLHDL 3.7 12/17/2022   Last hemoglobin A1c Lab Results  Component Value Date   HGBA1C 6.0 (A) 03/24/2023    The 10-year ASCVD risk score (Arnett DK, et al., 2019) is: 3.2%    Assessment & Plan:   Problem List Items Addressed This Visit       Cardiovascular and Mediastinum   Hypertension associated with diabetes (HCC)    Well controlled. Blood pressure today was 113/77.  Continue to monitor       Relevant Orders   Comprehensive metabolic panel   Lipid panel     Endocrine   Type 2 diabetes mellitus with hyperglycemia, without long-term current use of insulin (HCC)    Last HbgA1c was well controlled at 6.0 (03/24/23). Patient believes blood sugar is  well controlled and is not experiencing side effects associated with DM. Weight decreased to 207 since starting Ozempic. Recheck HbgA1c  Continue current medications      Relevant Orders   Hemoglobin A1c   Hyperlipidemia associated with type 2 diabetes mellitus (HCC) - Primary    Relatively well controlled. Last lipid panel was WNL with exception of triglycerides and VLDL, which were both elevated.  Recheck CMP / lipid panel  Continue current management       Relevant Orders   Comprehensive metabolic panel   Lipid panel    Meds ordered this encounter  Medications   atorvastatin (LIPITOR) 10 MG tablet    Sig: TAKE 1 TABLET(10 MG) BY MOUTH AT BEDTIME    Dispense:  90 tablet    Refill:  1   empagliflozin (JARDIANCE) 25 MG TABS tablet    Sig: Take 1 tablet (25 mg total)  by mouth daily.    Dispense:  90 tablet    Refill:  1   metFORMIN (GLUCOPHAGE-XR) 500 MG 24 hr tablet    Sig: Take 2 tablets (1,000 mg total) by mouth 2 (two) times daily with a meal.    Dispense:  360 tablet    Refill:  1   Semaglutide, 1 MG/DOSE, 4 MG/3ML SOPN    Sig: Inject 1 mg as directed once a week.    Dispense:  3 mL    Refill:  3     Return in about 3 months (around 09/23/2023) for CPE, with pap.    Rometta Emery, Medical Student  Patient seen along with MS3 student Jodi Marble. I personally evaluated this patient along with the student, and verified all aspects of the history, physical exam, and medical decision making as documented by the student. I agree with the student's documentation and have made all necessary edits.  Axiel Fjeld, Marzella Schlein, MD, MPH [facility]

## 2023-06-23 NOTE — Assessment & Plan Note (Addendum)
Relatively well controlled. Last lipid panel was WNL with exception of triglycerides and VLDL, which were both elevated.  Recheck CMP / lipid panel  Continue current management

## 2023-06-23 NOTE — Assessment & Plan Note (Addendum)
Last HbgA1c was well controlled at 6.0 (03/24/23). Patient believes blood sugar is well controlled and is not experiencing side effects associated with DM. Weight decreased to 207 since starting Ozempic. Recheck HbgA1c  Continue current medications

## 2023-06-24 LAB — COMPREHENSIVE METABOLIC PANEL
ALT: 29 IU/L (ref 0–32)
AST: 26 IU/L (ref 0–40)
Albumin: 4 g/dL (ref 3.8–4.9)
Alkaline Phosphatase: 73 IU/L (ref 44–121)
BUN/Creatinine Ratio: 13 (ref 9–23)
BUN: 10 mg/dL (ref 6–24)
Bilirubin Total: 0.3 mg/dL (ref 0.0–1.2)
CO2: 22 mmol/L (ref 20–29)
Calcium: 9.1 mg/dL (ref 8.7–10.2)
Chloride: 109 mmol/L — ABNORMAL HIGH (ref 96–106)
Creatinine, Ser: 0.77 mg/dL (ref 0.57–1.00)
Globulin, Total: 1.7 g/dL (ref 1.5–4.5)
Glucose: 102 mg/dL — ABNORMAL HIGH (ref 70–99)
Potassium: 4.3 mmol/L (ref 3.5–5.2)
Sodium: 144 mmol/L (ref 134–144)
Total Protein: 5.7 g/dL — ABNORMAL LOW (ref 6.0–8.5)
eGFR: 90 mL/min/{1.73_m2} (ref >59)

## 2023-06-24 LAB — LIPID PANEL
Chol/HDL Ratio: 2.8 ratio (ref 0.0–4.4)
Cholesterol, Total: 119 mg/dL (ref 100–199)
HDL: 42 mg/dL (ref >39)
LDL Chol Calc (NIH): 51 mg/dL (ref 0–99)
Triglycerides: 153 mg/dL — ABNORMAL HIGH (ref 0–149)
VLDL Cholesterol Cal: 26 mg/dL (ref 5–40)

## 2023-06-24 LAB — HEMOGLOBIN A1C
Est. average glucose Bld gHb Est-mCnc: 131 mg/dL
Hgb A1c MFr Bld: 6.2 % — ABNORMAL HIGH (ref 4.8–5.6)

## 2023-06-26 ENCOUNTER — Encounter: Payer: Self-pay | Admitting: Family Medicine

## 2023-06-27 DIAGNOSIS — Z23 Encounter for immunization: Secondary | ICD-10-CM | POA: Diagnosis not present

## 2023-07-08 ENCOUNTER — Ambulatory Visit: Payer: Self-pay

## 2023-07-08 NOTE — Patient Outreach (Signed)
  Care Coordination   Initial Visit Note   07/08/2023 Name: Elizabeth [name] MRN: [id number] DOB: February 27, 1966  Elizabeth [name] is a 57 y.o. year old female who sees Bacigalupo, Marzella Schlein, MD for primary care. I spoke with  Lorette Ang by phone today.  What matters to the patients health and wellness today?  CM educated patient on [facility] services.  Patient declines services and feels that she is able to manage her medical needs.  Patient agreed to contact her provider in the future if she needs [facility] services.    Goals Addressed   None     SDOH assessments and interventions completed:  No     Care Coordination Interventions:  No, not indicated   Follow up plan: No further intervention required.   Encounter Outcome:  Pt. Refused

## 2023-07-08 NOTE — Patient Outreach (Addendum)
Error

## 2023-07-11 DIAGNOSIS — H524 Presbyopia: Secondary | ICD-10-CM | POA: Diagnosis not present

## 2023-07-25 ENCOUNTER — Other Ambulatory Visit: Payer: Self-pay | Admitting: Family Medicine

## 2023-07-28 ENCOUNTER — Other Ambulatory Visit: Payer: Self-pay | Admitting: Family Medicine

## 2023-07-28 NOTE — Telephone Encounter (Signed)
Resend to [facility] in [place].

## 2023-07-28 NOTE — Telephone Encounter (Signed)
Medication Refill - Medication: LINZESS 290 MCG CAPS capsule  Has the patient contacted their pharmacy? Yes.   Pharmacy states that the refill was not sent to them, showing pharmacy confirmed receipt of refill on 07/25/2023.   Preferred Pharmacy (with phone number or street name): [id number] [place], [place] - [address] AT [facility] OF [address] [facility] [address] [place], [place] [address] Phone: [phone]  Fax: [phone]  Has the patient been seen for an appointment in the last year OR does the patient have an upcoming appointment? Yes.    Agent: Please be advised that RX refills may take up to 3 business days. We ask that you follow-up with your pharmacy.

## 2023-07-30 NOTE — Telephone Encounter (Signed)
Medication was refilled 07/29/23, duplicate request.  Requested Prescriptions  Pending Prescriptions Disp Refills   linaclotide (LINZESS) 290 MCG CAPS capsule 90 capsule 0     Gastroenterology: Irritable Bowel Syndrome Passed - 07/28/2023 11:17 AM      Passed - Valid encounter within last 12 months    Recent Outpatient Visits           1 month ago Hyperlipidemia associated with type 2 diabetes mellitus [facility])   [place] [facility] [place], Marzella Schlein, MD   4 months ago Type 2 diabetes mellitus with hyperglycemia, without long-term current use of insulin [facility])   [place] [facility] [place], Marzella Schlein, MD   7 months ago Type 2 diabetes mellitus with hyperglycemia, without long-term current use of insulin [facility])   [place] [facility] [place], Marzella Schlein, MD   10 months ago Type 2 diabetes mellitus with hyperglycemia, without long-term current use of insulin [facility])   [place] [facility] [place], Marzella Schlein, MD   1 year ago Hypertension associated with diabetes [facility])    [facility] Bacigalupo, Marzella Schlein, MD       Future Appointments             In 2 months Bacigalupo, Marzella Schlein, MD [facility], [facility]

## 2023-07-31 ENCOUNTER — Telehealth: Payer: Self-pay | Admitting: Family Medicine

## 2023-07-31 MED ORDER — SEMAGLUTIDE (1 MG/DOSE) 4 MG/3ML ~~LOC~~ SOPN: 1.0000 mg | PEN_INJECTOR | SUBCUTANEOUS | 0 refills | Status: DC

## 2023-07-31 NOTE — Telephone Encounter (Signed)
[facility] faxed refill request for the following medications:   Semaglutide, 1 MG/DOSE, 4 MG/3ML SOPN    Please advise.

## 2023-08-13 ENCOUNTER — Encounter: Payer: Self-pay | Admitting: Family Medicine

## 2023-08-14 ENCOUNTER — Ambulatory Visit (INDEPENDENT_AMBULATORY_CARE_PROVIDER_SITE_OTHER): Payer: 59 | Admitting: Family Medicine

## 2023-08-14 ENCOUNTER — Encounter: Payer: Self-pay | Admitting: Family Medicine

## 2023-08-14 VITALS — BP 117/85 | HR 96 | Temp 98.2°F | Resp 16 | Ht 66.0 in | Wt 208.3 lb

## 2023-08-14 DIAGNOSIS — J0100 Acute maxillary sinusitis, unspecified: Secondary | ICD-10-CM | POA: Diagnosis not present

## 2023-08-14 MED ORDER — DOXYCYCLINE HYCLATE 100 MG PO TABS: 100.0000 mg | ORAL_TABLET | Freq: Two times a day (BID) | ORAL | 0 refills | Status: AC

## 2023-08-14 NOTE — Progress Notes (Signed)
Acute Office Visit  Subjective:     Patient ID: Elizabeth [name], female    DOB: [birth year], 57 y.o.   MRN: [id number]  Chief Complaint  Patient presents with   Sinusitis    Post nasal drip, congestion, phlegm, cough and dry mouth x 2 weeks.     Sinusitis   Discussed the use of AI scribe software for clinical note transcription with the patient, who gave verbal consent to proceed.  History of Present Illness   The patient presents with a two-week history of nasal congestion and postnasal drip, initially attributed to allergies. She has been working outdoors, cutting bushes and grass. She reports a sensation of 'running down my throat,' particularly in the morning and at night when lying down. She has tried to alleviate symptoms by propping herself up when sleeping. She also reports a sensation of phlegm in her chest, which she noticed when laughing. She has been using Flonase, Astroprol, and Azelex, and recently tried Vicks, which caused a burning sensation. She also reports dryness in her throat due to breathing through her mouth, which she has been trying to manage by drinking a lot of water. She has been experiencing headaches over the past week.       ROS      Objective:    BP 117/85 (BP Location: Left Arm, Patient Position: Sitting, Cuff Size: Large)   Pulse 96   Temp 98.2 F (36.8 C) (Temporal)   Resp 16   Ht 5\' 6"  (1.676 m)   Wt 208 lb 4.8 oz (94.5 kg)   LMP 07/09/2014   SpO2 98%   BMI 33.62 kg/m    Physical Exam Vitals reviewed.  Constitutional:      General: She is not in acute distress.    Appearance: Normal appearance. She is well-developed. She is not diaphoretic.  HENT:     Head: Normocephalic and atraumatic.     Right Ear: Tympanic membrane, ear canal and external ear normal.     Left Ear: Tympanic membrane, ear canal and external ear normal.     Nose: Congestion present.     Mouth/Throat:     Mouth: Mucous membranes are moist.     Pharynx:  Oropharynx is clear. No oropharyngeal exudate.  Eyes:     General: No scleral icterus.    Conjunctiva/sclera: Conjunctivae normal.     Pupils: Pupils are equal, round, and reactive to light.  Cardiovascular:     Rate and Rhythm: Normal rate and regular rhythm.     Pulses: Normal pulses.     Heart sounds: Normal heart sounds. No murmur heard. Pulmonary:     Effort: Pulmonary effort is normal. No respiratory distress.     Breath sounds: Normal breath sounds. No wheezing or rales.  Musculoskeletal:     Cervical back: Neck supple.     Right lower leg: No edema.     Left lower leg: No edema.  Lymphadenopathy:     Cervical: No cervical adenopathy.  Skin:    General: Skin is warm and dry.     Findings: No rash.  Neurological:     Mental Status: She is alert.     No results found for any visits on 08/14/23.      Assessment & Plan:   Problem List Items Addressed This Visit   None Visit Diagnoses     Acute non-recurrent maxillary sinusitis    -  Primary   Relevant Medications   doxycycline (VIBRA-TABS) 100 MG  tablet          Upper Respiratory Infection - sinusitis Persistent symptoms for two weeks with postnasal drip, congestion, and phlegm. Nasal inflammation noted on examination. -Prescribe Doxycycline, twice a day for seven days. -Advise to hold off on nasal spray for a day or two to allow for healing of nasal inflammation. -Consider use of plain Vaseline to coat the outside of the nose for healing.  Influenza Vaccination Patient reported receiving the flu shot on 08/12/2023. -Documented receipt of flu shot.        Meds ordered this encounter  Medications   doxycycline (VIBRA-TABS) 100 MG tablet    Sig: Take 1 tablet (100 mg total) by mouth 2 (two) times daily for 7 days.    Dispense:  14 tablet    Refill:  0    No follow-ups on file.  Shirlee Latch, MD

## 2023-08-20 DIAGNOSIS — J018 Other acute sinusitis: Secondary | ICD-10-CM | POA: Diagnosis not present

## 2023-08-29 ENCOUNTER — Other Ambulatory Visit: Payer: Self-pay | Admitting: Physician Assistant

## 2023-08-29 DIAGNOSIS — E611 Iron deficiency: Secondary | ICD-10-CM

## 2023-09-01 ENCOUNTER — Telehealth: Payer: Self-pay | Admitting: Family Medicine

## 2023-09-01 DIAGNOSIS — M0609 Rheumatoid arthritis without rheumatoid factor, multiple sites: Secondary | ICD-10-CM | POA: Diagnosis not present

## 2023-09-01 DIAGNOSIS — E611 Iron deficiency: Secondary | ICD-10-CM

## 2023-09-01 DIAGNOSIS — Z79899 Other long term (current) drug therapy: Secondary | ICD-10-CM | POA: Diagnosis not present

## 2023-09-01 DIAGNOSIS — M19042 Primary osteoarthritis, left hand: Secondary | ICD-10-CM | POA: Diagnosis not present

## 2023-09-01 DIAGNOSIS — M19041 Primary osteoarthritis, right hand: Secondary | ICD-10-CM | POA: Diagnosis not present

## 2023-09-01 NOTE — Telephone Encounter (Signed)
Please review. Do patient still needs to be on this medication?

## 2023-09-01 NOTE — Telephone Encounter (Signed)
[facility] is requesting prescription refill ferrous sulfate 324 (65 Fe) MG TBEC  Please advise

## 2023-09-01 NOTE — Telephone Encounter (Signed)
Sent to office today in a separate refill encounter, pending provider approval.

## 2023-09-01 NOTE — Telephone Encounter (Signed)
Requested medication (s) are due for refill today: yes  Requested medication (s) are on the active medication list: yes  Last refill:  05/27/23  Future visit scheduled: yes  Notes to clinic:  Unable to refill per protocol due to failed labs, no updated results.      Requested Prescriptions  Pending Prescriptions Disp Refills   ferrous sulfate 324 (65 Fe) MG TBEC [Pharmacy Med Name: FERROUS SULFATE 324MG  EC TABS RED] 90 tablet 0    Sig: TAKE 1 TABLET BY MOUTH DAILY     Endocrinology:  Minerals - Iron Supplementation Failed - 08/29/2023  2:05 PM      Failed - HGB in normal range and within 360 days    Hemoglobin  Date Value Ref Range Status  09/07/2020 12.3 11.7 - 15.5 g/dL Final         Failed - HCT in normal range and within 360 days    HCT  Date Value Ref Range Status  09/07/2020 37.2 35.0 - 45.0 % Final         Failed - RBC in normal range and within 360 days    RBC  Date Value Ref Range Status  09/07/2020 3.98 3.80 - 5.10 Million/uL Final         Failed - Fe (serum) in normal range and within 360 days    No results found for: "IRON", "IRONPCTSAT"       Failed - Ferritin in normal range and within 360 days    No results found for: "FERRITIN"       Passed - Valid encounter within last 12 months    Recent Outpatient Visits           2 weeks ago Acute non-recurrent maxillary sinusitis   [place] [facility] Erasmo Downer, MD   2 months ago Hyperlipidemia associated with type 2 diabetes mellitus [facility])   [place] [facility] [place], Marzella Schlein, MD   5 months ago Type 2 diabetes mellitus with hyperglycemia, without long-term current use of insulin [facility])   [place] [facility] [place], Marzella Schlein, MD   8 months ago Type 2 diabetes mellitus with hyperglycemia, without long-term current use of insulin [facility])   [place] [facility] [place], Marzella Schlein, MD   11 months ago Type 2 diabetes  mellitus with hyperglycemia, without long-term current use of insulin [facility])   [place] [facility] [place], Marzella Schlein, MD       Future Appointments             In 1 month Bacigalupo, Marzella Schlein, MD [facility], [facility]

## 2023-09-01 NOTE — Telephone Encounter (Signed)
Medication Refill - Medication: ferrous sulfate 324 (65 Fe) MG TBEC   Pt is calling to f/u on refill.  Please advise.    Has the patient contacted their pharmacy? Yes.    (Agent: If yes, when and what did the pharmacy advise?)  Preferred Pharmacy (with phone number or street name):  [facility][id number] [place], [place] - [address] AT [facility] OF [address] [place]  [address] [place] [address]  Phone: [phone] Fax: [phone]  Hours: Not open 24 hours   Has the patient been seen for an appointment in the last year OR does the patient have an upcoming appointment? Yes.    Agent: Please be advised that RX refills may take up to 3 business days. We ask that you follow-up with your pharmacy.

## 2023-09-05 MED ORDER — FERROUS SULFATE 324 (65 FE) MG PO TBEC: 1.0000 | DELAYED_RELEASE_TABLET | Freq: Every day | ORAL | 0 refills | Status: DC

## 2023-09-10 DIAGNOSIS — R430 Anosmia: Secondary | ICD-10-CM | POA: Diagnosis not present

## 2023-09-10 DIAGNOSIS — J018 Other acute sinusitis: Secondary | ICD-10-CM | POA: Diagnosis not present

## 2023-09-10 DIAGNOSIS — J324 Chronic pansinusitis: Secondary | ICD-10-CM | POA: Diagnosis not present

## 2023-09-18 DIAGNOSIS — L720 Epidermal cyst: Secondary | ICD-10-CM | POA: Diagnosis not present

## 2023-10-07 ENCOUNTER — Other Ambulatory Visit (HOSPITAL_COMMUNITY)
Admission: RE | Admit: 2023-10-07 | Discharge: 2023-10-07 | Disposition: A | Discharge status: Home / Self Care | Payer: 59 | Source: Ambulatory Visit | Attending: Family Medicine | Admitting: Family Medicine

## 2023-10-07 ENCOUNTER — Encounter: Payer: Self-pay | Admitting: Family Medicine

## 2023-10-07 ENCOUNTER — Ambulatory Visit (INDEPENDENT_AMBULATORY_CARE_PROVIDER_SITE_OTHER): Payer: 59 | Admitting: Family Medicine

## 2023-10-07 VITALS — BP 115/79 | HR 88 | Ht 67.0 in | Wt 209.0 lb

## 2023-10-07 DIAGNOSIS — Z124 Encounter for screening for malignant neoplasm of cervix: Secondary | ICD-10-CM

## 2023-10-07 DIAGNOSIS — M05741 Rheumatoid arthritis with rheumatoid factor of right hand without organ or systems involvement: Secondary | ICD-10-CM | POA: Diagnosis not present

## 2023-10-07 DIAGNOSIS — E1165 Type 2 diabetes mellitus with hyperglycemia: Secondary | ICD-10-CM

## 2023-10-07 DIAGNOSIS — I152 Hypertension secondary to endocrine disorders: Secondary | ICD-10-CM

## 2023-10-07 DIAGNOSIS — Z6832 Body mass index (BMI) 32.0-32.9, adult: Secondary | ICD-10-CM

## 2023-10-07 DIAGNOSIS — E66811 Obesity, class 1: Secondary | ICD-10-CM

## 2023-10-07 DIAGNOSIS — E611 Iron deficiency: Secondary | ICD-10-CM

## 2023-10-07 DIAGNOSIS — E1159 Type 2 diabetes mellitus with other circulatory complications: Secondary | ICD-10-CM | POA: Diagnosis not present

## 2023-10-07 DIAGNOSIS — K581 Irritable bowel syndrome with constipation: Secondary | ICD-10-CM | POA: Diagnosis not present

## 2023-10-07 DIAGNOSIS — D849 Immunodeficiency, unspecified: Secondary | ICD-10-CM | POA: Diagnosis not present

## 2023-10-07 DIAGNOSIS — Z01419 Encounter for gynecological examination (general) (routine) without abnormal findings: Secondary | ICD-10-CM | POA: Diagnosis present

## 2023-10-07 DIAGNOSIS — M05742 Rheumatoid arthritis with rheumatoid factor of left hand without organ or systems involvement: Secondary | ICD-10-CM

## 2023-10-07 DIAGNOSIS — E1169 Type 2 diabetes mellitus with other specified complication: Secondary | ICD-10-CM

## 2023-10-07 DIAGNOSIS — Z0000 Encounter for general adult medical examination without abnormal findings: Secondary | ICD-10-CM

## 2023-10-07 DIAGNOSIS — Z1151 Encounter for screening for human papillomavirus (HPV): Secondary | ICD-10-CM | POA: Insufficient documentation

## 2023-10-07 MED ORDER — FERROUS SULFATE 324 (65 FE) MG PO TBEC: 1.0000 | DELAYED_RELEASE_TABLET | Freq: Every day | ORAL | 5 refills | Status: DC

## 2023-10-07 MED ORDER — LINACLOTIDE 290 MCG PO CAPS: 290.0000 ug | ORAL_CAPSULE | Freq: Every day | ORAL | 1 refills | Status: DC

## 2023-10-07 MED ORDER — SEMAGLUTIDE (2 MG/DOSE) 8 MG/3ML ~~LOC~~ SOPN: 2.0000 mg | PEN_INJECTOR | SUBCUTANEOUS | 3 refills | Status: DC

## 2023-10-07 NOTE — Progress Notes (Signed)
Complete physical exam  Patient: Elizabeth [name]   DOB: 1966-08-31   57 y.o. Female  MRN: [id number]  Subjective:    No chief complaint on file.   Elizabeth [name] is a 57 y.o. female who presents today for a complete physical exam. She reports consuming a general diet.  She generally feels well. She reports sleeping well. She does not have additional problems to discuss today.    Discussed the use of AI scribe software for clinical note transcription with the patient, who gave verbal consent to proceed.  History of Present Illness   The patient, with a history of weight issues and colon removal surgery, presents for a routine physical, breast exam, and Pap smear. She recently returned from an emergency trip to [place] to care for her mother, who has been dealing with depression and significant weight loss due to not eating. The patient herself has been struggling with weight loss and feels that her current medication, Ozempic, is no longer suppressing her appetite as effectively. She also mentions discomfort from scar tissue resulting from a previous colon removal surgery. She is currently on a regimen of metformin, Jardiance, atorvastatin, aspirin, methotrexate, folate, ferrous sulfate, and Linzess.       Most recent fall risk assessment:    04/28/2023    3:14 PM  Fall Risk   Falls in the past year? 0  Number falls in past yr: 0  Injury with Fall? 0  Risk for fall due to : No Fall Risks  Follow up Education provided;Falls prevention discussed     Most recent depression screenings:    10/07/2023    8:49 AM 04/28/2023    3:18 PM  PHQ 2/9 Scores  PHQ - 2 Score 0 0  PHQ- 9 Score 0         Patient Care Team: Erasmo Downer, MD as PCP - General (Family Medicine) Edward Jolly, MD as Consulting Physician (Pain Medicine) Patterson Hammersmith, MD as Consulting Physician (Rheumatology) Geanie Logan, MD as Referring Physician (Otolaryngology) Midge Minium, MD as Consulting  Physician (Gastroenterology) Doreene Burke, CNM as Midwife (Certified Nurse Midwife) Neysa Hotter, MD as Consulting Physician (Psychiatry)   Outpatient Medications Prior to Visit  Medication Sig   Accu-Chek FastClix Lancets MISC USE TO CHECK BLOOD SUGAR TWICE DAILY   ammonium lactate (AMLACTIN) 12 % lotion Apply 1 application. topically as needed for dry skin.   aspirin 81 MG tablet Take 81 mg by mouth daily.   atorvastatin (LIPITOR) 10 MG tablet TAKE 1 TABLET(10 MG) BY MOUTH AT BEDTIME   azelastine (ASTELIN) 0.1 % nasal spray Place 2 sprays into both nostrils 2 (two) times daily. Use in each nostril as directed   B-D INS SYR ULTRAFINE 1CC/31G 31G X 5/16" 1 ML MISC    Blood Glucose Monitoring Suppl (ACCU-CHEK GUIDE) w/Device KIT AS DIRECTED EVERY DAY   cholecalciferol (VITAMIN D) 1000 units tablet Take 1,000 Units by mouth daily.   clobetasol ointment (TEMOVATE) 0.05 % APPLY EXTERNALLY TO THE AFFECTED AREA TWICE DAILY   empagliflozin (JARDIANCE) 25 MG TABS tablet Take 1 tablet (25 mg total) by mouth daily.   fluticasone (FLONASE) 50 MCG/ACT nasal spray Place into both nostrils daily.   folic acid (FOLVITE) 1 MG tablet Take 1 mg by mouth daily.   glucose blood (ACCU-CHEK GUIDE) test strip USE TO CHECK BLOOD SUGARS AS DIRECTED BY DOCTORS TWICE DAILY   melatonin 5 MG TABS Take 5 mg by mouth at bedtime as  needed.   metFORMIN (GLUCOPHAGE-XR) 500 MG 24 hr tablet Take 2 tablets (1,000 mg total) by mouth 2 (two) times daily with a meal.   Methotrexate Sodium (METHOTREXATE, PF,) 50 MG/2ML injection 15 mg once a week.   Multiple Vitamins-Minerals (MULTIVITAMIN/EXTRA VITAMIN D3 PO) Take by mouth.   Omega-3 Fatty Acids (FISH OIL PO) Take by mouth.   OVER THE COUNTER MEDICATION once daily Herbal Name: Goli   Vitamin A 2400 MCG (8000 UT) TABS Take by mouth.   vitamin B-12 (CYANOCOBALAMIN) 1000 MCG tablet Take 1,000 mcg by mouth daily.   Zinc 22.5 MG TABS Take by mouth.   [DISCONTINUED] ferrous  sulfate 324 (65 Fe) MG TBEC Take 1 tablet (325 mg total) by mouth daily.   [DISCONTINUED] LINZESS 290 MCG CAPS capsule TAKE 1 CAPSULE(290 MCG) BY MOUTH DAILY   [DISCONTINUED] Semaglutide, 1 MG/DOSE, 4 MG/3ML SOPN Inject 1 mg as directed once a week.   No facility-administered medications prior to visit.    ROS        Objective:     BP 115/79 (BP Location: Right Arm, Patient Position: Sitting, Cuff Size: Large)   Pulse 88   Ht 5\' 7"  (1.702 m)   Wt 209 lb (94.8 kg)   LMP 07/09/2014   SpO2 97%   BMI 32.73 kg/m    Physical Exam Vitals reviewed.  Constitutional:      General: She is not in acute distress.    Appearance: Normal appearance. She is well-developed. She is not diaphoretic.  HENT:     Head: Normocephalic and atraumatic.     Right Ear: Tympanic membrane, ear canal and external ear normal.     Left Ear: Tympanic membrane, ear canal and external ear normal.     Nose: Nose normal.     Mouth/Throat:     Mouth: Mucous membranes are moist.     Pharynx: Oropharynx is clear. No oropharyngeal exudate.  Eyes:     General: No scleral icterus.    Conjunctiva/sclera: Conjunctivae normal.     Pupils: Pupils are equal, round, and reactive to light.  Neck:     Thyroid: No thyromegaly.  Cardiovascular:     Rate and Rhythm: Normal rate and regular rhythm.     Heart sounds: Normal heart sounds. No murmur heard. Pulmonary:     Effort: Pulmonary effort is normal. No respiratory distress.     Breath sounds: Normal breath sounds. No wheezing or rales.  Abdominal:     General: There is no distension.     Palpations: Abdomen is soft.     Tenderness: There is no abdominal tenderness.  Genitourinary:    Comments: GYN:  External genitalia within normal limits.  Vaginal mucosa pink, moist, normal rugae.  Nonfriable cervix without lesions, no discharge or bleeding noted on speculum exam.   Musculoskeletal:        General: No deformity.     Cervical back: Neck supple.     Right  lower leg: No edema.     Left lower leg: No edema.  Lymphadenopathy:     Cervical: No cervical adenopathy.  Skin:    General: Skin is warm and dry.     Findings: No rash.  Neurological:     Mental Status: She is alert and oriented to person, place, and time. Mental status is at baseline.     Gait: Gait normal.  Psychiatric:        Mood and Affect: Mood normal.  Behavior: Behavior normal.        Thought Content: Thought content normal.      No results found for any visits on 10/07/23.     Assessment & Plan:    Routine Health Maintenance and Physical Exam  Immunization History  Administered Date(s) Administered   Hepatitis A, Adult 11/12/2017, 05/12/2018   Influenza,inj,Quad PF,6+ Mos 08/06/2017, 07/14/2018, 08/10/2019, 07/25/2020, 08/23/2021, 08/02/2022   Influenza-Unspecified 08/06/2017, 08/12/2023   Moderna Covid-19 Fall Seasonal Vaccine 68yrs & older 06/27/2023   Moderna Covid-19 Vaccine Bivalent Booster 16yrs & up 08/23/2021   Moderna Sars-Covid-2 Vaccination 02/10/2020, 03/04/2020, 07/25/2020, 03/07/2021   PFIZER Comirnaty(Gray Top)Covid-19 Tri-Sucrose Vaccine 08/27/2022   PNEUMOCOCCAL CONJUGATE-20 02/06/2022   Pneumococcal Polysaccharide-23 11/04/2017   Td 11/04/2017   Tdap 11/29/2015, 11/04/2017   Zoster Recombinant(Shingrix) 01/08/2018, 04/01/2018    Health Maintenance  Topic Date Due   Cervical Cancer Screening (HPV/Pap Cotest)  05/26/2021   COVID-19 Vaccine (8 - 2023-24 season) 08/22/2023   Diabetic kidney evaluation - Urine ACR  12/18/2023   HEMOGLOBIN A1C  12/24/2023   MAMMOGRAM  02/20/2024   FOOT EXAM  03/23/2024   Medicare Annual Wellness (AWV)  04/27/2024   OPHTHALMOLOGY EXAM  05/28/2024   Diabetic kidney evaluation - eGFR measurement  06/22/2024   Colonoscopy  05/17/2027   DTaP/Tdap/Td (4 - Td or Tdap) 11/05/2027   INFLUENZA VACCINE  Completed   Hepatitis C Screening  Completed   HIV Screening  Completed   Zoster Vaccines- Shingrix   Completed   HPV VACCINES  Aged Out    Discussed health benefits of physical activity, and encouraged her to engage in regular exercise appropriate for her age and condition.  Problem List Items Addressed This Visit       Cardiovascular and Mediastinum   Hypertension associated with diabetes (HCC)    Well controlled Continue current medications      Relevant Medications   Semaglutide, 2 MG/DOSE, 8 MG/3ML SOPN     Digestive   IBS (irritable bowel syndrome)    Currently on Linzess. Well controlled -Refill Linzess prescription for 90 days.      Relevant Medications   linaclotide (LINZESS) 290 MCG CAPS capsule     Endocrine   Type 2 diabetes mellitus with hyperglycemia, without long-term current use of insulin (HCC)    Plateau in weight loss. Last A1 well controlled. Currently on Ozempic 1mg  weekly, Metformin 1000mg  twice daily, and Jardiance 25mg  daily. Discussed increasing Ozempic to aid in further weight loss. -Increase Ozempic to 2mg  weekly. -Continue Metformin 1000mg  twice daily and Jardiance 25mg  daily. -Plan to reassess in 3 months with labs.      Relevant Medications   Semaglutide, 2 MG/DOSE, 8 MG/3ML SOPN   Hyperlipidemia associated with type 2 diabetes mellitus (HCC)    Previously well controlled Continue statin Repeat FLP and CMP at next visit Goal LDL < 70      Relevant Medications   Semaglutide, 2 MG/DOSE, 8 MG/3ML SOPN     Musculoskeletal and Integument   Rheumatoid arthritis involving both hands (HCC) (Chronic)    F/b Rheum Continue methotrexate and folic acid        Other   Immunosuppressed status (HCC)    F/b Rheum Advised on vaccinations      Obesity    Discussed importance of healthy weight management Discussed diet and exercise       Relevant Medications   Semaglutide, 2 MG/DOSE, 8 MG/3ML SOPN   Other Visit Diagnoses     Encounter for  annual physical exam    -  Primary   Cervical cancer screening       Relevant Orders   Cytology  - PAP   Iron deficiency       Relevant Medications   ferrous sulfate 324 (65 Fe) MG TBEC           Iron Deficiency Request for more iron pills. -Refill Ferrous Sulfate prescription for 90 days.  General Health Maintenance -Plan to get COVID booster at pharmacy. -Continue Methotrexate for RA with Folate. -Continue Atorvastatin 10mg  and Aspirin for cholesterol management. -Next colonoscopy due in 2028. -Next mammogram due in March 2025. -Perform Pap smear today, if normal, next due in 5 years. -Plan to follow up in 3 months with labs.       Return in about 3 months (around 01/07/2024) for chronic disease f/u.     Shirlee Latch, MD

## 2023-10-07 NOTE — Assessment & Plan Note (Signed)
Plateau in weight loss. Last A1 well controlled. Currently on Ozempic 1mg  weekly, Metformin 1000mg  twice daily, and Jardiance 25mg  daily. Discussed increasing Ozempic to aid in further weight loss. -Increase Ozempic to 2mg  weekly. -Continue Metformin 1000mg  twice daily and Jardiance 25mg  daily. -Plan to reassess in 3 months with labs.

## 2023-10-07 NOTE — Assessment & Plan Note (Signed)
Currently on Linzess. Well controlled -Refill Linzess prescription for 90 days.

## 2023-10-07 NOTE — Assessment & Plan Note (Signed)
F/b Rheum Advised on vaccinations

## 2023-10-07 NOTE — Assessment & Plan Note (Signed)
Previously well controlled Continue statin Repeat FLP and CMP at next visit Goal LDL < 70  

## 2023-10-07 NOTE — Assessment & Plan Note (Signed)
F/b Rheum Continue methotrexate and folic acid

## 2023-10-07 NOTE — Assessment & Plan Note (Signed)
Discussed importance of healthy weight management Discussed diet and exercise  

## 2023-10-07 NOTE — Assessment & Plan Note (Signed)
Well controlled. Continue current medications  

## 2023-10-09 LAB — CYTOLOGY - PAP
Comment: NEGATIVE
Diagnosis: NEGATIVE
High risk HPV: NEGATIVE

## 2023-12-07 ENCOUNTER — Other Ambulatory Visit: Payer: Self-pay | Admitting: Family Medicine

## 2023-12-07 DIAGNOSIS — E1165 Type 2 diabetes mellitus with hyperglycemia: Secondary | ICD-10-CM

## 2023-12-18 DIAGNOSIS — L720 Epidermal cyst: Secondary | ICD-10-CM | POA: Diagnosis not present

## 2024-01-02 DIAGNOSIS — J310 Chronic rhinitis: Secondary | ICD-10-CM | POA: Diagnosis not present

## 2024-01-09 ENCOUNTER — Encounter: Payer: Self-pay | Admitting: Family Medicine

## 2024-01-09 ENCOUNTER — Ambulatory Visit (INDEPENDENT_AMBULATORY_CARE_PROVIDER_SITE_OTHER): Payer: 59 | Admitting: Family Medicine

## 2024-01-09 VITALS — BP 125/83 | HR 86 | Ht 66.5 in | Wt 202.4 lb

## 2024-01-09 DIAGNOSIS — E1159 Type 2 diabetes mellitus with other circulatory complications: Secondary | ICD-10-CM

## 2024-01-09 DIAGNOSIS — E119 Type 2 diabetes mellitus without complications: Secondary | ICD-10-CM | POA: Diagnosis not present

## 2024-01-09 DIAGNOSIS — E1165 Type 2 diabetes mellitus with hyperglycemia: Secondary | ICD-10-CM | POA: Diagnosis not present

## 2024-01-09 DIAGNOSIS — M05742 Rheumatoid arthritis with rheumatoid factor of left hand without organ or systems involvement: Secondary | ICD-10-CM

## 2024-01-09 DIAGNOSIS — Z79899 Other long term (current) drug therapy: Secondary | ICD-10-CM | POA: Diagnosis not present

## 2024-01-09 DIAGNOSIS — M19042 Primary osteoarthritis, left hand: Secondary | ICD-10-CM | POA: Diagnosis not present

## 2024-01-09 DIAGNOSIS — E785 Hyperlipidemia, unspecified: Secondary | ICD-10-CM | POA: Diagnosis not present

## 2024-01-09 DIAGNOSIS — M19041 Primary osteoarthritis, right hand: Secondary | ICD-10-CM | POA: Diagnosis not present

## 2024-01-09 DIAGNOSIS — I152 Hypertension secondary to endocrine disorders: Secondary | ICD-10-CM | POA: Diagnosis not present

## 2024-01-09 DIAGNOSIS — Z1231 Encounter for screening mammogram for malignant neoplasm of breast: Secondary | ICD-10-CM

## 2024-01-09 DIAGNOSIS — E1169 Type 2 diabetes mellitus with other specified complication: Secondary | ICD-10-CM | POA: Diagnosis not present

## 2024-01-09 DIAGNOSIS — M0609 Rheumatoid arthritis without rheumatoid factor, multiple sites: Secondary | ICD-10-CM | POA: Diagnosis not present

## 2024-01-09 DIAGNOSIS — M05741 Rheumatoid arthritis with rheumatoid factor of right hand without organ or systems involvement: Secondary | ICD-10-CM | POA: Diagnosis not present

## 2024-01-09 MED ORDER — SEMAGLUTIDE (2 MG/DOSE) 8 MG/3ML ~~LOC~~ SOPN: 2.0000 mg | PEN_INJECTOR | SUBCUTANEOUS | 3 refills | Status: AC

## 2024-01-09 MED ORDER — METFORMIN HCL ER 500 MG PO TB24: 1000.0000 mg | ORAL_TABLET | Freq: Two times a day (BID) | ORAL | 1 refills | Status: DC

## 2024-01-09 MED ORDER — ATORVASTATIN CALCIUM 10 MG PO TABS: ORAL_TABLET | ORAL | 1 refills | Status: DC

## 2024-01-09 MED ORDER — LINACLOTIDE 290 MCG PO CAPS: 290.0000 ug | ORAL_CAPSULE | Freq: Every day | ORAL | 1 refills | Status: DC

## 2024-01-09 MED ORDER — EMPAGLIFLOZIN 25 MG PO TABS: 25.0000 mg | ORAL_TABLET | Freq: Every day | ORAL | 1 refills | Status: DC

## 2024-01-09 NOTE — Assessment & Plan Note (Signed)
Well-controlled with current medication regimen. No new concerns or symptoms. Discussed regular monitoring and adherence to medication. Explained metformin safety unless significant kidney dysfunction, which is not present. A1c to be checked today. - Check A1c - Refill Jardiance 25 mg daily - Refill Ozempic 2 mg weekly - Refill Metformin XR 1000 mg BID

## 2024-01-09 NOTE — Assessment & Plan Note (Signed)
 Well controlled  - Continue current management

## 2024-01-09 NOTE — Assessment & Plan Note (Signed)
Well-managed on atorvastatin. No new concerns or symptoms. Advised to avoid large quantities of grapefruit due to potential interaction with atorvastatin; small amounts are generally safe. - Refill atorvastatin 10 mg daily - Check cholesterol levels

## 2024-01-09 NOTE — Progress Notes (Signed)
Established patient visit   Patient: Elizabeth [name]   DOB: 1966-02-06   58 y.o. Female  MRN: [id number] Visit Date: 01/09/2024  Today's healthcare provider: Shirlee Latch, MD   Chief Complaint  Patient presents with   Medical Management of Chronic Issues   Hypertension   Diabetes   Hyperlipidemia   Subjective     HPI     Hypertension   This is a chronic problem.  The problem is controlled.  Compliance with treatment is excellent.        Diabetes   Fatigue: Present.  Eye exam is current.  Patient sees a podiatrist.      Last edited by Acey Lav, CMA on 01/09/2024  8:07 AM.       Discussed the use of AI scribe software for clinical note transcription with the patient, who gave verbal consent to proceed.  History of Present Illness   The patient, with a history of high cholesterol, diabetes, constipation, and arthritis, presents for a routine follow-up. They report no new concerns or changes in their health status. They are currently on atorvastatin 10mg  daily for cholesterol, Jardiance 25mg  daily, Ozempic 2mg  weekly, and metformin 1000mg  twice daily for diabetes. They also take Linzess for constipation. They are also under the care of a rheumatologist and are on methotrexate and folic acid for arthritis. They recently had eye surgery to remove cysts from their eyelids and report a successful recovery. They also mention a recent weight loss of seven pounds since October. They express dissatisfaction with a recent visit to an ENT specialist and are considering seeing a different doctor within the same practice.         Medications: Outpatient Medications Prior to Visit  Medication Sig   Accu-Chek FastClix Lancets MISC USE TO CHECK BLOOD SUGAR TWICE DAILY   ammonium lactate (AMLACTIN) 12 % lotion Apply 1 application. topically as needed for dry skin.   aspirin 81 MG tablet Take 81 mg by mouth daily.   azelastine (ASTELIN) 0.1 % nasal spray Place 2 sprays  into both nostrils 2 (two) times daily. Use in each nostril as directed   B-D INS SYR ULTRAFINE 1CC/31G 31G X 5/16" 1 ML MISC    Blood Glucose Monitoring Suppl (ACCU-CHEK GUIDE) w/Device KIT AS DIRECTED EVERY DAY   cholecalciferol (VITAMIN D) 1000 units tablet Take 1,000 Units by mouth daily.   clobetasol ointment (TEMOVATE) 0.05 % APPLY EXTERNALLY TO THE AFFECTED AREA TWICE DAILY   ferrous sulfate 324 (65 Fe) MG TBEC Take 1 tablet (325 mg total) by mouth daily.   fluticasone (FLONASE) 50 MCG/ACT nasal spray Place into both nostrils daily.   folic acid (FOLVITE) 1 MG tablet Take 1 mg by mouth daily.   glucose blood (ACCU-CHEK GUIDE) test strip USE TO CHECK BLOOD SUGARS AS DIRECTED BY DOCTORS TWICE DAILY   melatonin 5 MG TABS Take 5 mg by mouth at bedtime as needed.   Methotrexate Sodium (METHOTREXATE, PF,) 50 MG/2ML injection 15 mg once a week.   Multiple Vitamins-Minerals (MULTIVITAMIN/EXTRA VITAMIN D3 PO) Take by mouth.   Omega-3 Fatty Acids (FISH OIL PO) Take by mouth.   OVER THE COUNTER MEDICATION once daily Herbal Name: Goli   Vitamin A 2400 MCG (8000 UT) TABS Take by mouth.   vitamin B-12 (CYANOCOBALAMIN) 1000 MCG tablet Take 1,000 mcg by mouth daily.   Zinc 22.5 MG TABS Take by mouth.   [DISCONTINUED] atorvastatin (LIPITOR) 10 MG tablet TAKE 1 TABLET(10 MG) BY  MOUTH AT BEDTIME   [DISCONTINUED] empagliflozin (JARDIANCE) 25 MG TABS tablet Take 1 tablet (25 mg total) by mouth daily.   [DISCONTINUED] linaclotide (LINZESS) 290 MCG CAPS capsule Take 1 capsule (290 mcg total) by mouth daily before breakfast.   [DISCONTINUED] metFORMIN (GLUCOPHAGE-XR) 500 MG 24 hr tablet TAKE 2 TABLETS(1000 MG) BY MOUTH TWICE DAILY WITH A MEAL   [DISCONTINUED] Semaglutide, 2 MG/DOSE, 8 MG/3ML SOPN Inject 2 mg as directed once a week.   No facility-administered medications prior to visit.    Review of Systems     Objective    BP 125/83 (BP Location: Left Arm, Patient Position: Sitting, Cuff Size: Large)    Pulse 86   Ht 5' 6.5" (1.689 m)   Wt 202 lb 6.4 oz (91.8 kg)   LMP 07/09/2014   SpO2 100%   BMI 32.18 kg/m    Physical Exam Vitals reviewed.  Constitutional:      General: She is not in acute distress.    Appearance: Normal appearance. She is well-developed. She is not diaphoretic.  HENT:     Head: Normocephalic and atraumatic.  Eyes:     General: No scleral icterus.    Conjunctiva/sclera: Conjunctivae normal.  Neck:     Thyroid: No thyromegaly.  Cardiovascular:     Rate and Rhythm: Normal rate and regular rhythm.     Heart sounds: Normal heart sounds. No murmur heard. Pulmonary:     Effort: Pulmonary effort is normal. No respiratory distress.     Breath sounds: Normal breath sounds. No wheezing, rhonchi or rales.  Musculoskeletal:     Cervical back: Neck supple.     Right lower leg: No edema.     Left lower leg: No edema.  Lymphadenopathy:     Cervical: No cervical adenopathy.  Skin:    General: Skin is warm and dry.     Findings: No rash.  Neurological:     Mental Status: She is alert and oriented to person, place, and time. Mental status is at baseline.  Psychiatric:        Mood and Affect: Mood normal.        Behavior: Behavior normal.      No results found for any visits on 01/09/24.  Assessment & Plan     Problem List Items Addressed This Visit       Cardiovascular and Mediastinum   Hypertension associated with diabetes (HCC) - Primary   Well controlled Continue current management      Relevant Medications   atorvastatin (LIPITOR) 10 MG tablet   empagliflozin (JARDIANCE) 25 MG TABS tablet   metFORMIN (GLUCOPHAGE-XR) 500 MG 24 hr tablet   Semaglutide, 2 MG/DOSE, 8 MG/3ML SOPN   Other Relevant Orders   Comprehensive metabolic panel     Endocrine   Type 2 diabetes mellitus with hyperglycemia, without long-term current use of insulin (HCC)   Well-controlled with current medication regimen. No new concerns or symptoms. Discussed regular monitoring  and adherence to medication. Explained metformin safety unless significant kidney dysfunction, which is not present. A1c to be checked today. - Check A1c - Refill Jardiance 25 mg daily - Refill Ozempic 2 mg weekly - Refill Metformin XR 1000 mg BID      Relevant Medications   atorvastatin (LIPITOR) 10 MG tablet   empagliflozin (JARDIANCE) 25 MG TABS tablet   metFORMIN (GLUCOPHAGE-XR) 500 MG 24 hr tablet   Semaglutide, 2 MG/DOSE, 8 MG/3ML SOPN   Other Relevant Orders   Urine microalbumin-creatinine with  uACR   Hemoglobin A1c   Hyperlipidemia associated with type 2 diabetes mellitus (HCC)   Well-managed on atorvastatin. No new concerns or symptoms. Advised to avoid large quantities of grapefruit due to potential interaction with atorvastatin; small amounts are generally safe. - Refill atorvastatin 10 mg daily - Check cholesterol levels      Relevant Medications   atorvastatin (LIPITOR) 10 MG tablet   empagliflozin (JARDIANCE) 25 MG TABS tablet   metFORMIN (GLUCOPHAGE-XR) 500 MG 24 hr tablet   Semaglutide, 2 MG/DOSE, 8 MG/3ML SOPN   Other Relevant Orders   Comprehensive metabolic panel   Lipid Panel With LDL/HDL Ratio     Musculoskeletal and Integument   Rheumatoid arthritis involving both hands (HCC) (Chronic)   Managed by rheumatology with methotrexate and folic acid. No new concerns or symptoms. Emphasized importance of folic acid supplementation to mitigate methotrexate side effects. - Refill methotrexate - Refill folic acid      Other Visit Diagnoses       Hyperlipidemia LDL goal <70       Relevant Medications   atorvastatin (LIPITOR) 10 MG tablet     Breast cancer screening by mammogram       Relevant Orders   MM 3D SCREENING MAMMOGRAM BILATERAL BREAST     Long-term use of high-risk medication               General Health Maintenance Routine health maintenance and screenings up to date. Due for eye exam in June. Recent eye surgery for eyelid cysts with good  recovery. Discussed need for yearly urine test for protein and regular kidney and liver function tests. - Order yearly urine test for protein - Order kidney and liver function tests - Order mammogram for March - Schedule eye exam for June  Follow-up - Follow up in six months for physical exam.       Return in about 6 months (around 07/08/2024) for CPE.       Shirlee Latch, MD  [facility] [phone] (phone) [phone] (fax)  [facility]

## 2024-01-09 NOTE — Assessment & Plan Note (Signed)
Managed by rheumatology with methotrexate and folic acid. No new concerns or symptoms. Emphasized importance of folic acid supplementation to mitigate methotrexate side effects. - Refill methotrexate - Refill folic acid

## 2024-02-23 ENCOUNTER — Ambulatory Visit
Admission: RE | Admit: 2024-02-23 | Discharge: 2024-02-23 | Disposition: A | Discharge status: Home / Self Care | Payer: 59 | Source: Ambulatory Visit | Attending: Family Medicine | Admitting: Family Medicine

## 2024-02-23 DIAGNOSIS — Z1231 Encounter for screening mammogram for malignant neoplasm of breast: Secondary | ICD-10-CM | POA: Diagnosis not present

## 2024-03-25 ENCOUNTER — Ambulatory Visit (INDEPENDENT_AMBULATORY_CARE_PROVIDER_SITE_OTHER): Admitting: Family Medicine

## 2024-03-25 ENCOUNTER — Encounter: Payer: Self-pay | Admitting: Family Medicine

## 2024-03-25 VITALS — BP 123/79 | HR 100 | Ht 67.0 in | Wt 195.7 lb

## 2024-03-25 DIAGNOSIS — B372 Candidiasis of skin and nail: Secondary | ICD-10-CM | POA: Diagnosis not present

## 2024-03-25 DIAGNOSIS — L0291 Cutaneous abscess, unspecified: Secondary | ICD-10-CM

## 2024-03-25 MED ORDER — CLOTRIMAZOLE 1 % EX CREA: 1.0000 | TOPICAL_CREAM | Freq: Two times a day (BID) | CUTANEOUS | 0 refills | Status: AC

## 2024-03-25 NOTE — Progress Notes (Signed)
 Acute visit   Patient: Elizabeth [name]   DOB: 04/01/1966   58 y.o. Female  MRN: [id number] PCP: Erasmo Downer, MD   Chief Complaint  Patient presents with   Vaginal Itching    Pt reports itching on left side of pelvis. Reports was also on lower abdomen but has now cleared up. Reports causes her to scratch a lot. Associated with bumps that reminds her of hair bumps.    Mass    Lump under right arm X 1 week. Associated with pain but has now stopped. Now smaller in size. Pt reports having a marker in her right nipple   Subjective    Discussed the use of AI scribe software for clinical note transcription with the patient, who gave verbal consent to proceed.  History of Present Illness   The patient, with a history of breast issues, presents with two main concerns. Firstly, she has been experiencing an itchy rash on the left side of her pelvis. The rash, initially thought to be due to chafing, has been treated with various creams including Benadryl and Vaseline. However, the patient reports that scratching the area causes bumps to appear. The rash was initially under the patient's stomach and on the left side, but has since subsided in those areas. The patient denies any changes in personal hygiene habits and is concerned about the possibility of a fungal infection.  Secondly, the patient reports a lump under her right arm that has been present for about a week. The lump was initially sore and large, causing discomfort when applying deodorant. The lump has since decreased in size and is less tender. The patient denies any fevers or drainage from the lump.  In addition to these concerns, the patient mentions a significant weight loss, now weighing 195 pounds. She also requests an increased dosage of sildenafil for her partner, as the current dosage is not effective.       Review of Systems   Objective    BP 123/79 (BP Location: Left Arm, Patient Position: Sitting, Cuff Size:  Large)   Pulse 100   Ht 5\' 7"  (1.702 m)   Wt 195 lb 11.2 oz (88.8 kg)   LMP 07/09/2014   SpO2 97%   BMI 30.65 kg/m    Physical Exam Vitals reviewed.  Constitutional:      General: She is not in acute distress.    Appearance: She is well-developed.  HENT:     Head: Normocephalic and atraumatic.  Eyes:     General: No scleral icterus.    Conjunctiva/sclera: Conjunctivae normal.  Cardiovascular:     Rate and Rhythm: Normal rate and regular rhythm.  Pulmonary:     Effort: Pulmonary effort is normal. No respiratory distress.  Skin:    General: Skin is warm and dry.     Findings: No rash.     Comments: Erythematous rash with satellite lesion in L groin  Neurological:     Mental Status: She is alert and oriented to person, place, and time.  Psychiatric:        Behavior: Behavior normal.       No results found for any visits on 03/25/24.  Assessment & Plan     Problem List Items Addressed This Visit   None Visit Diagnoses       Candidal intertrigo    -  Primary   Relevant Medications   clotrimazole (LOTRIMIN AF) 1 % cream     Abscess  Intertrigo   Intertrigo in the groin and under the stomach, characterized by itching and bumps, likely due to a yeast infection in warm, moist skin folds. She has been using Benadryl cream and Vaseline, but symptoms persist. She prefers the term 'yeast' over 'fungal'.   - Prescribe Lotrimin (antifungal cream)   - Advise keeping the area dry and changing clothes if sweaty   - Avoid talcum powder due to potential health risks   - Consider using talc-free Gold Bond powder if needed    Axillary Abscess   Abscess under the right arm present for about a week, initially large and sore, now smaller and less tender. No fever or drainage reported. Likely caused by a break in the skin, possibly from shaving, allowing bacteria to form a pocket of pus. The abscess has mostly resolved on its own.   - Advise warm compresses to  encourage drainage if needed   - Monitor for recurrence; consider antibiotics if it becomes red and tender again    Medication Management   Discussed medication management for her husband's erectile dysfunction. He is currently on sildenafil 100 mg, taking half a pill, with inadequate results. There is a need to increase the dosage to a whole pill. Discussed increasing the prescription to 15 tablets per month, with potential cost changes as it is paid out of pocket.   - Increase sildenafil prescription for her husband to 15 tablets per month   - Advise her husband to take a whole sildenafil pill instead of half    General Health Maintenance   She received a COVID-19 booster shot recently. Discussed the importance of scheduling a physical exam as part of routine wellness care.   - Schedule a physical exam in October       Meds ordered this encounter  Medications   clotrimazole (LOTRIMIN AF) 1 % cream    Sig: Apply 1 Application topically 2 (two) times daily.    Dispense:  30 g    Refill:  0     Return in about 6 months (around 09/24/2024) for CPE.      Total time spent on today's visit was greater than 25 minutes, including both face-to-face time and nonface-to-face time personally spent on review of chart, treatment options, answering patient's questions, and coordinating care.   Aden Agreste, MD  [facility] [phone] (phone) [phone] (fax)  [facility]

## 2024-04-01 ENCOUNTER — Encounter: Payer: Self-pay | Admitting: Certified Nurse Midwife

## 2024-04-01 ENCOUNTER — Other Ambulatory Visit (HOSPITAL_COMMUNITY)
Admission: RE | Admit: 2024-04-01 | Discharge: 2024-04-01 | Disposition: A | Discharge status: Home / Self Care | Source: Ambulatory Visit | Attending: Certified Nurse Midwife | Admitting: Certified Nurse Midwife

## 2024-04-01 ENCOUNTER — Ambulatory Visit (INDEPENDENT_AMBULATORY_CARE_PROVIDER_SITE_OTHER): Admitting: Certified Nurse Midwife

## 2024-04-01 VITALS — BP 104/66 | HR 76 | Wt 191.5 lb

## 2024-04-01 DIAGNOSIS — L280 Lichen simplex chronicus: Secondary | ICD-10-CM | POA: Diagnosis not present

## 2024-04-01 DIAGNOSIS — N898 Other specified noninflammatory disorders of vagina: Secondary | ICD-10-CM

## 2024-04-01 DIAGNOSIS — L723 Sebaceous cyst: Secondary | ICD-10-CM

## 2024-04-01 DIAGNOSIS — L089 Local infection of the skin and subcutaneous tissue, unspecified: Secondary | ICD-10-CM

## 2024-04-01 MED ORDER — TRIAMCINOLONE ACETONIDE 0.5 % EX OINT: 1.0000 | TOPICAL_OINTMENT | Freq: Two times a day (BID) | CUTANEOUS | 0 refills | Status: AC

## 2024-04-01 MED ORDER — DOXYCYCLINE HYCLATE 100 MG PO CAPS: 100.0000 mg | ORAL_CAPSULE | Freq: Two times a day (BID) | ORAL | 0 refills | Status: AC

## 2024-04-01 NOTE — Progress Notes (Signed)
 Elizabeth Speed, MD   Chief Complaint  Patient presents with   Vaginal Itching    HPI:      Elizabeth [name] is a 58 y.o. No obstetric history on file. whose LMP was Patient's last menstrual period was 07/09/2014., presents today for vulvar/vaginal itching & painful bump to pubic area. She reports she was treated for yeast by her PCP to her groin/lower abdomen with cream, but the itching has continued. This began after being out in the yard working on her fence. Uses Dove soap, cotton underwear, no underwear at night. Monogamous with female     Patient Active Problem List   Diagnosis Date Noted   Spinal stenosis, lumbar region, with neurogenic claudication 05/21/2021   Chronic pain syndrome 05/21/2021   Chronic radicular lumbar pain 03/15/2021   Sacroiliac joint pain 03/15/2021   History of colonic polyps    Polyp of sigmoid colon    Chronic rhinitis 04/18/2020   Chronic bilateral low back pain with bilateral sciatica 01/20/2020   Pustular psoriasis of palms and soles 01/07/2020   Intraductal papilloma of breast, right 12/28/2019   Psoriasis 08/06/2019   Immunosuppressed status (HCC) 07/01/2019   Obesity 07/01/2019   Vaginal atrophy 01/01/2017   Hyperlipidemia associated with type 2 diabetes mellitus (HCC) 10/04/2016   S/P partial colectomy 01/24/2016   Hypertension associated with diabetes (HCC) 01/17/2016   Bilateral carpal tunnel syndrome 01/03/2016   Inversion, nipple 11/08/2015   Type 2 diabetes mellitus with hyperglycemia, without long-term current use of insulin  (HCC) 10/23/2015   Rheumatoid arthritis involving both hands (HCC) 10/23/2015   Benign neoplasm of descending colon    Benign neoplasm of sigmoid colon    Bipolar 1 disorder (HCC) 07/03/2015   IBS (irritable bowel syndrome) 07/03/2015   Insomnia 07/03/2015   GERD without esophagitis 07/03/2015    Past Surgical History:  Procedure Laterality Date   BOWEL RESECTION     BREAST BIOPSY Right 12/22/2019    affirm bx x marker, papilloma    BREAST BIOPSY     CHOLECYSTECTOMY  1997   [place], [place]   COLONOSCOPY WITH PROPOFOL  N/A 09/07/2015   Procedure: COLONOSCOPY WITH PROPOFOL ;  Surgeon: Marnee Sink, MD;  [facility];  Service: Endoscopy;  Laterality: N/A;   COLONOSCOPY WITH PROPOFOL  N/A 05/16/2020   Procedure: COLONOSCOPY WITH PROPOFOL ;  Surgeon: Marnee Sink, MD;  Location: [facility];  Service: Endoscopy;  Laterality: N/A;   ESOPHAGOGASTRODUODENOSCOPY (EGD) WITH ESOPHAGEAL DILATION     ESOPHAGOGASTRODUODENOSCOPY (EGD) WITH PROPOFOL  N/A 09/07/2015   Procedure: ESOPHAGOGASTRODUODENOSCOPY (EGD) WITH PROPOFOL ;  Surgeon: Marnee Sink, MD;  Location: [facility];  Service: Endoscopy;  Laterality: N/A;   PARTIAL COLECTOMY N/A 01/24/2016   Procedure: SIGMOID COLECTOMY;  Surgeon: Claudia Cuff, MD;  Location: [facility] ORS;  Service: General;  Laterality: N/A;   POLYPECTOMY N/A 09/07/2015   Procedure: POLYPECTOMY;  Surgeon: Marnee Sink, MD;  Location: [facility];  Service: Endoscopy;  Laterality: N/A;   TUBAL LIGATION      Family History  Problem Relation Age of Onset   Hypertension Mother    Diabetes Mother    Arthritis/Rheumatoid Mother    Hyperlipidemia Mother    Alcohol abuse Father    Esophageal varices Father    Diabetes Brother    Kidney disease Brother    Healthy Brother    Diverticulosis Maternal Grandmother    Diabetes Maternal Grandmother    Heart disease Maternal Grandmother    Dementia Maternal Grandmother  Aneurysm Maternal Grandfather    Diabetes Paternal Grandmother    Depression Daughter        bipolar disorder   Healthy Son    Diverticulosis Maternal Aunt    Breast cancer Neg Hx    Colon cancer Neg Hx     Social History   Socioeconomic History   Marital status: Married    Spouse name: Allayne Arabian   Number of children: 3   Years of education: some college   Highest education level: Associate degree: occupational, Scientist, product/process development, or  vocational program  Occupational History   Occupation: Disabled  Tobacco Use   Smoking status: Former    Current packs/day: 0.00    Average packs/day: 1 pack/day for 1 year (1.0 ttl pk-yrs)    Types: Cigarettes    Start date: 05/05/2014    Quit date: 05/06/2015    Years since quitting: 8.9   Smokeless tobacco: Never  Vaping Use   Vaping status: Never Used  Substance and Sexual Activity   Alcohol use: Yes    Comment: rarely small glass of wine, couple times per year   Drug use: No   Sexual activity: Not Currently    Partners: Male    Birth control/protection: None  Other Topics Concern   Not on file  Social History Narrative   Not on file   Social Drivers of Health   Financial Resource Strain: Low Risk  (01/07/2024)   Overall Financial Resource Strain (CARDIA)    Difficulty of Paying Living Expenses: Not hard at all  Food Insecurity: No Food Insecurity (01/07/2024)   Hunger Vital Sign    Worried About Running Out of Food in the Last Year: Never true    Ran Out of Food in the Last Year: Never true  Transportation Needs: No Transportation Needs (01/07/2024)   PRAPARE - Administrator, Civil Service (Medical): No    Lack of Transportation (Non-Medical): No  Physical Activity: Inactive (01/07/2024)   Exercise Vital Sign    Days of Exercise per Week: 0 days    Minutes of Exercise per Session: 0 min  Stress: No Stress Concern Present (01/07/2024)   [employer] of Occupational Health - Occupational Stress Questionnaire    Feeling of Stress : Not at all  Social Connections: Socially Integrated (01/07/2024)   Social Connection and Isolation Panel [NHANES]    Frequency of Communication with Friends and Family: More than three times a week    Frequency of Social Gatherings with Friends and Family: More than three times a week    Attends Religious Services: More than 4 times per year    Active Member of [employer] or Organizations: Yes    Attends Hospital doctor: More than 4 times per year    Marital Status: Married  Catering manager Violence: Not At Risk (04/28/2023)   Humiliation, Afraid, Rape, and Kick questionnaire    Fear of Current or Ex-Partner: No    Emotionally Abused: No    Physically Abused: No    Sexually Abused: No    Outpatient Medications Prior to Visit  Medication Sig Dispense Refill   Accu-Chek FastClix Lancets MISC USE TO CHECK BLOOD SUGAR TWICE DAILY 100 each 0   ammonium lactate  (AMLACTIN) 12 % lotion Apply 1 application. topically as needed for dry skin. 400 g 0   aspirin  81 MG tablet Take 81 mg by mouth daily.     atorvastatin  (LIPITOR) 10 MG tablet TAKE 1 TABLET(10 MG) BY MOUTH AT BEDTIME  90 tablet 1   azelastine  (ASTELIN ) 0.1 % nasal spray Place 2 sprays into both nostrils 2 (two) times daily. Use in each nostril as directed 30 mL 12   B-D INS SYR ULTRAFINE 1CC/31G 31G X 5/16" 1 ML MISC   1   Blood Glucose Monitoring Suppl (ACCU-CHEK GUIDE) w/Device KIT AS DIRECTED EVERY DAY 1 kit 0   cholecalciferol (VITAMIN D ) 1000 units tablet Take 1,000 Units by mouth daily.     clotrimazole  (LOTRIMIN  AF) 1 % cream Apply 1 Application topically 2 (two) times daily. 30 g 0   empagliflozin  (JARDIANCE ) 25 MG TABS tablet Take 1 tablet (25 mg total) by mouth daily. 90 tablet 1   ferrous sulfate  324 (65 Fe) MG TBEC Take 1 tablet (325 mg total) by mouth daily. 90 tablet 5   fluticasone  (FLONASE ) 50 MCG/ACT nasal spray Place into both nostrils daily.     folic acid  (FOLVITE ) 1 MG tablet Take 1 mg by mouth daily.     glucose blood (ACCU-CHEK GUIDE) test strip USE TO CHECK BLOOD SUGARS AS DIRECTED BY DOCTORS TWICE DAILY 100 strip 3   ipratropium (ATROVENT) 0.03 % nasal spray SMARTSIG:2 Spray(s) Both Nares 3 Times Daily PRN     linaclotide  (LINZESS ) 290 MCG CAPS capsule Take 1 capsule (290 mcg total) by mouth daily before breakfast. 90 capsule 1   melatonin 5 MG TABS Take 5 mg by mouth at bedtime as needed.     metFORMIN  (GLUCOPHAGE -XR)  500 MG 24 hr tablet Take 2 tablets (1,000 mg total) by mouth 2 (two) times daily with a meal. 360 tablet 1   Methotrexate  Sodium (METHOTREXATE , PF,) 50 MG/2ML injection 15 mg once a week.     Multiple Vitamins-Minerals (MULTIVITAMIN/EXTRA VITAMIN D3 PO) Take by mouth.     Omega-3 Fatty Acids (FISH OIL PO) Take by mouth.     OVER THE COUNTER MEDICATION once daily Herbal Name: Goli     Semaglutide , 2 MG/DOSE, 8 MG/3ML SOPN Inject 2 mg as directed once a week. 9 mL 3   traZODone  (DESYREL ) 50 MG tablet Take 25 mg by mouth at bedtime.     Vitamin A 2400 MCG (8000 UT) TABS Take by mouth.     vitamin B-12 (CYANOCOBALAMIN) 1000 MCG tablet Take 1,000 mcg by mouth daily.     Zinc 22.5 MG TABS Take by mouth.     clobetasol ointment (TEMOVATE) 0.05 % APPLY EXTERNALLY TO THE AFFECTED AREA TWICE DAILY     No facility-administered medications prior to visit.      ROS:  Review of Systems  Constitutional: Negative.   Genitourinary:  Negative for vaginal bleeding and vaginal discharge.       Painful area to top left, itching to vulva     OBJECTIVE:   Vitals:  BP 104/66   Pulse 76   Wt 191 lb 8 oz (86.9 kg)   LMP 07/09/2014   BMI 29.99 kg/m   Physical Exam Constitutional:      Appearance: Normal appearance.  Cardiovascular:     Rate and Rhythm: Normal rate.  Pulmonary:     Effort: Pulmonary effort is normal.  Genitourinary:    Pubic Area: No rash.      Tanner stage (genital): 5.       Comments: Patch of darkened skin to left groin. ~1cm closed comedone to upper left mons pubis, tender to palpation, indurated, without erythema Skin:    General: Skin is warm and dry.  Neurological:  General: No focal deficit present.     Mental Status: She is alert and oriented to person, place, and time.  Psychiatric:        Mood and Affect: Mood normal.        Behavior: Behavior normal.     Results: No results found for this or any previous visit (from the past 24  hours).   Assessment/Plan: Vagina itching - Plan: Cervicovaginal ancillary only  Lichen simplex  Vulvar cyst  Warm compresses to cyst, start doxycycline  given induration & pain/tenderness. Kenalog  to groin bid, discussed cyclic nature of itching following yeast infection and that irritation/inflammation could have been due to recent infection, trial steroid cream for relief. Continue home comfort measures.  Meds ordered this encounter  Medications   doxycycline  (VIBRAMYCIN ) 100 MG capsule    Sig: Take 1 capsule (100 mg total) by mouth 2 (two) times daily for 7 days.    Dispense:  14 capsule    Refill:  0   triamcinolone  ointment (KENALOG ) 0.5 %    Sig: Apply 1 Application topically 2 (two) times daily for 14 days. Apply thin layer to affected area twice daily    Dispense:  28 g    Refill:  0     Forestine Igo, CNM 04/01/2024 12:11 PM

## 2024-04-02 ENCOUNTER — Other Ambulatory Visit: Payer: Self-pay | Admitting: Certified Nurse Midwife

## 2024-04-02 ENCOUNTER — Encounter: Payer: Self-pay | Admitting: Certified Nurse Midwife

## 2024-04-02 DIAGNOSIS — B3731 Acute candidiasis of vulva and vagina: Secondary | ICD-10-CM

## 2024-04-02 LAB — CERVICOVAGINAL ANCILLARY ONLY
Bacterial Vaginitis (gardnerella): NEGATIVE
Candida Glabrata: NEGATIVE
Candida Vaginitis: POSITIVE — AB
Comment: NEGATIVE
Comment: NEGATIVE
Comment: NEGATIVE

## 2024-04-02 MED ORDER — FLUCONAZOLE 150 MG PO TABS
150.0000 mg | ORAL_TABLET | ORAL | 0 refills | Status: AC
Start: 2024-04-02 — End: 2024-04-06

## 2024-04-02 NOTE — Progress Notes (Signed)
 Diflucan  prescribed for vulvovaginal candidiasis.

## 2024-04-14 ENCOUNTER — Encounter: Payer: Self-pay | Admitting: Family Medicine

## 2024-04-28 ENCOUNTER — Ambulatory Visit: Payer: 59

## 2024-04-28 DIAGNOSIS — Z0000 Encounter for general adult medical examination without abnormal findings: Secondary | ICD-10-CM

## 2024-04-28 NOTE — Patient Instructions (Signed)
 Ms. Elizabeth [name] , Thank you for taking time out of your busy schedule to complete your Annual Wellness Visit with me. I enjoyed our conversation and look forward to speaking with you again next year. I, as well as your care team,  appreciate your ongoing commitment to your health goals. Please review the following plan we discussed and let me know if I can assist you in the future.  Follow up Visits: Next Medicare AWV with our clinical staff: 05/10/25 @ 1:10 PM BY PHONE   Have you seen your provider in the last 6 months (3 months if uncontrolled diabetes)? Yes   Clinician Recommendations:  Aim for 30 minutes of exercise or brisk walking, 6-8 glasses of water, and 5 servings of fruits and vegetables each day. TAKE CARE!      This is a list of the screening recommended for you and due dates:  Health Maintenance  Topic Date Due   Yearly kidney health urinalysis for diabetes  12/18/2023   Hemoglobin A1C  12/24/2023   Complete foot exam   03/23/2024   COVID-19 Vaccine (10 - 2024-25 season) 05/15/2024   Eye exam for diabetics  05/28/2024   Yearly kidney function blood test for diabetes  06/22/2024   Flu Shot  07/09/2024   Mammogram  02/22/2025   Medicare Annual Wellness Visit  04/28/2025   Colon Cancer Screening  05/17/2027   Pap with HPV screening  10/06/2028   DTaP/Tdap/Td vaccine (6 - Td or Tdap) 10/15/2033   Pneumococcal Vaccination  Completed   Hepatitis C Screening  Completed   HIV Screening  Completed   Zoster (Shingles) Vaccine  Completed   HPV Vaccine  Aged Out   Meningitis B Vaccine  Aged Out    Advanced directives: (ACP Link)Information on Advanced Care Planning can be found at Associate Professor Advance Health Care Directives Advance Health Care Directives. [email or web address]  Advance Care Planning is important because it:  [x]  Makes sure you receive the medical care that is consistent with your values, goals, and preferences  [x]  It provides guidance to your family and  loved ones and reduces their decisional burden about whether or not they are making the right decisions based on your wishes.  Follow the link provided in your after visit summary or read over the paperwork we have mailed to you to help you started getting your Advance Directives in place. If you need assistance in completing these, please reach out to us  so that we can help you!

## 2024-04-28 NOTE — Progress Notes (Signed)
 Subjective:   Elizabeth [name] is a 58 y.o. who presents for a Medicare Wellness preventive visit.  As a reminder, Annual Wellness Visits don't include a physical exam, and some assessments may be limited, especially if this visit is performed virtually. We may recommend an in-person follow-up visit with your provider if needed.  Visit Complete: Virtual I connected with  Elizabeth [name] on 04/28/24 by a audio enabled telemedicine application and verified that I am speaking with the correct person using two identifiers.  Patient Location: Home  Provider Location: Home Office  I discussed the limitations of evaluation and management by telemedicine. The patient expressed understanding and agreed to proceed.  Vital Signs: Because this visit was a virtual/telehealth visit, some criteria may be missing or patient reported. Any vitals not documented were not able to be obtained and vitals that have been documented are patient reported.  VideoDeclined- This patient declined Librarian, academic. Therefore the visit was completed with audio only.  Persons Participating in Visit: Patient.  AWV Questionnaire: No: Patient Medicare AWV questionnaire was not completed prior to this visit.  Cardiac Risk Factors include: advanced age (>14men, >63 women);diabetes mellitus;hypertension;dyslipidemia     Objective:     There were no vitals filed for this visit. There is no height or weight on file to calculate BMI.     04/28/2024    3:26 PM 04/28/2023    3:22 PM 04/30/2022    1:44 PM 04/16/2022    9:00 AM 04/12/2021    8:59 AM 04/10/2021    2:32 PM 03/15/2021   10:25 AM  Advanced Directives  Does Patient Have a Medical Advance Directive? No No Yes Yes Yes Yes Yes  Type of Surveyor, minerals;Living will Living will Healthcare Power of [place];Living will Healthcare Power of [place];Living will   Does patient want to make changes to medical advance  directive?       No - Patient declined  Copy of Healthcare Power of Attorney in Chart?     No - copy requested    Would patient like information on creating a medical advance directive? No - Patient declined          Current Medications (verified) Outpatient Encounter Medications as of 04/28/2024  Medication Sig   Accu-Chek FastClix Lancets MISC USE TO CHECK BLOOD SUGAR TWICE DAILY   ammonium lactate  (AMLACTIN) 12 % lotion Apply 1 application. topically as needed for dry skin.   aspirin  81 MG tablet Take 81 mg by mouth daily.   atorvastatin  (LIPITOR) 10 MG tablet TAKE 1 TABLET(10 MG) BY MOUTH AT BEDTIME   azelastine  (ASTELIN ) 0.1 % nasal spray Place 2 sprays into both nostrils 2 (two) times daily. Use in each nostril as directed   B-D INS SYR ULTRAFINE 1CC/31G 31G X 5/16" 1 ML MISC    Blood Glucose Monitoring Suppl (ACCU-CHEK GUIDE) w/Device KIT AS DIRECTED EVERY DAY   cholecalciferol (VITAMIN D ) 1000 units tablet Take 1,000 Units by mouth daily.   clotrimazole  (LOTRIMIN  AF) 1 % cream Apply 1 Application topically 2 (two) times daily.   empagliflozin  (JARDIANCE ) 25 MG TABS tablet Take 1 tablet (25 mg total) by mouth daily.   ferrous sulfate  324 (65 Fe) MG TBEC Take 1 tablet (325 mg total) by mouth daily.   fluticasone  (FLONASE ) 50 MCG/ACT nasal spray Place into both nostrils daily.   folic acid  (FOLVITE ) 1 MG tablet Take 1 mg by mouth daily.   glucose  blood (ACCU-CHEK GUIDE) test strip USE TO CHECK BLOOD SUGARS AS DIRECTED BY DOCTORS TWICE DAILY   ipratropium (ATROVENT) 0.03 % nasal spray SMARTSIG:2 Spray(s) Both Nares 3 Times Daily PRN   linaclotide  (LINZESS ) 290 MCG CAPS capsule Take 1 capsule (290 mcg total) by mouth daily before breakfast.   melatonin 5 MG TABS Take 5 mg by mouth at bedtime as needed.   metFORMIN  (GLUCOPHAGE -XR) 500 MG 24 hr tablet Take 2 tablets (1,000 mg total) by mouth 2 (two) times daily with a meal.   Methotrexate  Sodium (METHOTREXATE , PF,) 50 MG/2ML injection 15 mg  once a week.   Moringa Oleifera (MORINGA PO) Take by mouth daily. 2 TABS AT NIGHT   Multiple Vitamins-Minerals (MULTIVITAMIN/EXTRA VITAMIN D3 PO) Take by mouth.   Omega-3 Fatty Acids (FISH OIL PO) Take by mouth.   Semaglutide , 2 MG/DOSE, 8 MG/3ML SOPN Inject 2 mg as directed once a week.   traZODone  (DESYREL ) 50 MG tablet Take 25 mg by mouth at bedtime.   Vitamin A 2400 MCG (8000 UT) TABS Take by mouth.   vitamin B-12 (CYANOCOBALAMIN) 1000 MCG tablet Take 1,000 mcg by mouth daily.   OVER THE COUNTER MEDICATION once daily Herbal Name: Goli (Patient not taking: Reported on 04/28/2024)   Zinc 22.5 MG TABS Take by mouth. (Patient not taking: Reported on 04/28/2024)   No facility-administered encounter medications on file as of 04/28/2024.    Allergies (verified) Ace inhibitors, Enalapril , Gabapentin , Penicillins, and Statins   History: Past Medical History:  Diagnosis Date   Bipolar 1 disorder (HCC)    Depression    Diabetes mellitus without complication (HCC)    diverticulitis    GERD (gastroesophageal reflux disease)    Heart murmur    followed by PCP   Hyperlipidemia    Hypertension goal BP (blood pressure) < 130/80 01/17/2016   Insomnia    Irritable bowel syndrome    Motion sickness    boats   Obesity (BMI 30-39.9) 07/03/2015   RA (rheumatoid arthritis) (HCC)    Sciatica    Past Surgical History:  Procedure Laterality Date   BOWEL RESECTION     BREAST BIOPSY Right 12/22/2019   affirm bx x marker, papilloma    BREAST BIOPSY     CHOLECYSTECTOMY  1997   [place], [place]   COLONOSCOPY WITH PROPOFOL  N/A 09/07/2015   Procedure: COLONOSCOPY WITH PROPOFOL ;  Surgeon: Marnee Sink, MD;  Location: [facility];  Service: Endoscopy;  Laterality: N/A;   COLONOSCOPY WITH PROPOFOL  N/A 05/16/2020   Procedure: COLONOSCOPY WITH PROPOFOL ;  Surgeon: Marnee Sink, MD;  Location: [facility] END[facility];  Service: Endoscopy;  Laterality: N/A;   ESOPHAGOGASTRODUODENOSCOPY (EGD) WITH ESOPHAGEAL DILATION      ESOPHAGOGASTRODUODENOSCOPY (EGD) WITH PROPOFOL  N/A 09/07/2015   Procedure: ESOPHAGOGASTRODUODENOSCOPY (EGD) WITH PROPOFOL ;  Surgeon: Marnee Sink, MD;  Location: [facility];  Service: Endoscopy;  Laterality: N/A;   PARTIAL COLECTOMY N/A 01/24/2016   Procedure: SIGMOID COLECTOMY;  Surgeon: Claudia Cuff, MD;  Location: [facility] ORS;  Service: General;  Laterality: N/A;   POLYPECTOMY N/A 09/07/2015   Procedure: POLYPECTOMY;  Surgeon: Marnee Sink, MD;  Location: [facility];  Service: Endoscopy;  Laterality: N/A;   TUBAL LIGATION     Family History  Problem Relation Age of Onset   Hypertension Mother    Diabetes Mother    Arthritis/Rheumatoid Mother    Hyperlipidemia Mother    Alcohol abuse Father    Esophageal varices Father    Diabetes Brother    Kidney  disease Brother    Healthy Brother    Diverticulosis Maternal Grandmother    Diabetes Maternal Grandmother    Heart disease Maternal Grandmother    Dementia Maternal Grandmother    Aneurysm Maternal Grandfather    Diabetes Paternal Grandmother    Depression Daughter        bipolar disorder   Healthy Son    Diverticulosis Maternal Aunt    Breast cancer Neg Hx    Colon cancer Neg Hx    Social History   Socioeconomic History   Marital status: Married    Spouse name: Allayne Arabian   Number of children: 3   Years of education: some college   Highest education level: Associate degree: occupational, Scientist, product/process development, or vocational program  Occupational History   Occupation: Disabled  Tobacco Use   Smoking status: Former    Current packs/day: 0.00    Average packs/day: 1 pack/day for 1 year (1.0 ttl pk-yrs)    Types: Cigarettes    Start date: 05/05/2014    Quit date: 05/06/2015    Years since quitting: 8.9   Smokeless tobacco: Never  Vaping Use   Vaping status: Never Used  Substance and Sexual Activity   Alcohol use: Yes    Comment: rarely small glass of wine, couple times per year   Drug use: No   Sexual activity: Not  Currently    Partners: Male    Birth control/protection: None  Other Topics Concern   Not on file  Social History Narrative   Not on file   Social Drivers of Health   Financial Resource Strain: Low Risk  (04/28/2024)   Overall Financial Resource Strain (CARDIA)    Difficulty of Paying Living Expenses: Not hard at all  Food Insecurity: No Food Insecurity (04/28/2024)   Hunger Vital Sign    Worried About Running Out of Food in the Last Year: Never true    Ran Out of Food in the Last Year: Never true  Transportation Needs: No Transportation Needs (04/28/2024)   PRAPARE - Administrator, Civil Service (Medical): No    Lack of Transportation (Non-Medical): No  Physical Activity: Sufficiently Active (04/28/2024)   Exercise Vital Sign    Days of Exercise per Week: 7 days    Minutes of Exercise per Session: 30 min  Stress: No Stress Concern Present (04/28/2024)   [employer] of Occupational Health - Occupational Stress Questionnaire    Feeling of Stress : Not at all  Social Connections: Socially Integrated (04/28/2024)   Social Connection and Isolation Panel [NHANES]    Frequency of Communication with Friends and Family: More than three times a week    Frequency of Social Gatherings with Friends and Family: More than three times a week    Attends Religious Services: More than 4 times per year    Active Member of [employer] or Organizations: Yes    Attends Engineer, structural: More than 4 times per year    Marital Status: Married    Tobacco Counseling Counseling given: Not Answered    Clinical Intake:  Pre-visit preparation completed: Yes  Pain : No/denies pain     BMI - recorded: 29.9 Nutritional Status: BMI 25 -29 Overweight Nutritional Risks: None Diabetes: Yes CBG done?: No Did pt. bring in CBG monitor from home?: No  Lab Results  Component Value Date   HGBA1C 6.2 (H) 06/23/2023   HGBA1C 6.0 (A) 03/24/2023   HGBA1C 7.6 (A) 12/17/2022     How  often  do you need to have someone help you when you read instructions, pamphlets, or other written materials from your doctor or pharmacy?: 1 - Never  Interpreter Needed?: No  Information entered by :: Dellie Fergusson, LPN   Activities of Daily Living    04/28/2024    3:27 PM  In your present state of health, do you have any difficulty performing the following activities:  Hearing? 0  Vision? 0  Difficulty concentrating or making decisions? 0  Walking or climbing stairs? 1  Dressing or bathing? 0  Doing errands, shopping? 0  Preparing Food and eating ? N  Using the Toilet? N  In the past six months, have you accidently leaked urine? N  Do you have problems with loss of bowel control? N  Managing your Medications? N  Managing your Finances? N  Housekeeping or managing your Housekeeping? N    Patient Care Team: Mazie Speed, MD as PCP - General (Family Medicine) Cephus Collin, MD as Consulting Physician (Pain Medicine) Adolphus Akin, MD as Consulting Physician (Rheumatology) Von Grumbling, MD as Referring Physician (Otolaryngology) Marnee Sink, MD as Consulting Physician (Gastroenterology) Alise Appl, CNM as Midwife (Certified Nurse Midwife) Todd Fossa, MD as Consulting Physician (Psychiatry) Trudi Fus, MD as Consulting Physician (Ophthalmology)  Indicate any recent Medical Services you may have received from other than [facility] providers in the past year (date may be approximate).     Assessment:    This is a routine wellness examination for Elizabeth [name].  Hearing/Vision screen Hearing Screening - Comments:: NO AIDS Vision Screening - Comments:: WEARS GLASSES ALL DAY- DR.HAMPTON   Goals Addressed             This Visit's Progress    DIET - EAT MORE FRUITS AND VEGETABLES         Depression Screen     04/28/2024    3:25 PM 03/25/2024    1:19 PM 10/07/2023    8:49 AM 04/28/2023    3:18 PM 03/24/2023    8:18 AM 09/13/2022    8:12 AM 06/10/2022     9:01 AM  PHQ 2/9 Scores  PHQ - 2 Score 0 0 0 0 0 0 0  PHQ- 9 Score 0  0  0 4 0    Fall Risk     04/28/2024    3:27 PM 03/25/2024    1:19 PM 04/28/2023    3:14 PM 03/24/2023    8:18 AM 09/13/2022    8:12 AM  Fall Risk   Falls in the past year? 0 0 0 0 0  Number falls in past yr: 0 0 0 0 0  Injury with Fall? 0 0 0 0 0  Risk for fall due to : No Fall Risks No Fall Risks No Fall Risks No Fall Risks No Fall Risks  Follow up Falls evaluation completed Falls evaluation completed Education provided;Falls prevention discussed Falls evaluation completed Falls evaluation completed    MEDICARE RISK AT HOME:  Medicare Risk at Home Any stairs in or around the home?: No If so, are there any without handrails?: No Home free of loose throw rugs in walkways, pet beds, electrical cords, etc?: Yes Adequate lighting in your home to reduce risk of falls?: Yes Life alert?: No Use of a cane, walker or w/c?: No Grab bars in the bathroom?: Yes Shower chair or bench in shower?: No Elevated toilet seat or a handicapped toilet?: Yes  TIMED UP AND GO:  Was the test performed?  No  Cognitive Function: 6CIT completed        04/28/2024    3:29 PM 04/28/2023    3:25 PM 04/01/2019    9:12 AM 03/27/2018   10:11 AM  6CIT Screen  What Year? 0 points 0 points 0 points 0 points  What month? 0 points 0 points 0 points 0 points  What time? 0 points 0 points 0 points 0 points  Count back from 20 0 points 0 points 0 points 0 points  Months in reverse 0 points 0 points 0 points 0 points  Repeat phrase 0 points 0 points 0 points 0 points  Total Score 0 points 0 points 0 points 0 points    Immunizations Immunization History  Administered Date(s) Administered   Hepatitis A, Adult 11/12/2017, 05/12/2018   Influenza, Seasonal, Injecte, Preservative Fre 08/12/2023   Influenza,inj,Quad PF,6+ Mos 08/06/2017, 07/14/2018, 08/10/2019, 07/25/2020, 08/23/2021, 08/02/2022   Influenza-Unspecified 08/06/2017, 08/12/2023    Moderna Covid-19 Fall Seasonal Vaccine 74yrs & older 08/27/2022, 06/27/2023, 10/08/2023, 03/20/2024   Moderna Covid-19 Vaccine  Bivalent Booster 57yrs & up 08/23/2021   Moderna Sars-Covid-2 Vaccination 02/10/2020, 03/04/2020, 07/25/2020, 03/07/2021   PFIZER Comirnaty(Gray Top)Covid-19 Tri-Sucrose Vaccine 08/27/2022   PNEUMOCOCCAL CONJUGATE-20 04/16/2021, 02/06/2022   Pneumococcal Polysaccharide-23 11/04/2017   Td 11/04/2017   Tdap 11/29/2015, 08/09/2016, 11/04/2017, 10/16/2023   Zoster Recombinant(Shingrix) 01/08/2018, 04/01/2018, 12/29/2020, 02/27/2021    Screening Tests Health Maintenance  Topic Date Due   Diabetic kidney evaluation - Urine ACR  12/18/2023   HEMOGLOBIN A1C  12/24/2023   FOOT EXAM  03/23/2024   COVID-19 Vaccine (10 - 2024-25 season) 05/15/2024   OPHTHALMOLOGY EXAM  05/28/2024   Diabetic kidney evaluation - eGFR measurement  06/22/2024   INFLUENZA VACCINE  07/09/2024   MAMMOGRAM  02/22/2025   Medicare Annual Wellness (AWV)  04/28/2025   Colonoscopy  05/17/2027   Cervical Cancer Screening (HPV/Pap Cotest)  10/06/2028   DTaP/Tdap/Td (6 - Td or Tdap) 10/15/2033   Pneumococcal Vaccine 78-101 Years old  Completed   Hepatitis C Screening  Completed   HIV Screening  Completed   Zoster Vaccines- Shingrix  Completed   HPV VACCINES  Aged Out   Meningococcal B Vaccine  Aged Out    Health Maintenance  Health Maintenance Due  Topic Date Due   Diabetic kidney evaluation - Urine ACR  12/18/2023   HEMOGLOBIN A1C  12/24/2023   FOOT EXAM  03/23/2024   Health Maintenance Items Addressed: UP TO DATE W/ SHOTS; UP TO DATE MAMMOGRAM & COLONOSCOPY; HAS APPT FOR FOOT EXAM & EYE EXAM  Additional Screening:  Vision Screening: Recommended annual ophthalmology exams for early detection of glaucoma and other disorders of the eye.  Dental Screening: Recommended annual dental exams for proper oral hygiene  Community Resource Referral / Chronic Care Management: CRR required this  visit?  No   CCM required this visit?  No   Plan:    I have personally reviewed and noted the following in the patient's chart:   Medical and social history Use of alcohol, tobacco or illicit drugs  Current medications and supplements including opioid prescriptions. Patient is not currently taking opioid prescriptions. Functional ability and status Nutritional status Physical activity Advanced directives List of other physicians Hospitalizations, surgeries, and ER visits in previous 12 months Vitals Screenings to include cognitive, depression, and falls Referrals and appointments  In addition, I have reviewed and discussed with patient certain preventive protocols, quality metrics, and best practice recommendations. A written personalized care plan for preventive services as well as  general preventive health recommendations were provided to patient.   Pinky Bright, LPN   03/17/8118   After Visit Summary: (MyChart) Due to this being a telephonic visit, the after visit summary with patients personalized plan was offered to patient via MyChart   Notes: Nothing significant to report at this time.

## 2024-05-12 DIAGNOSIS — M19041 Primary osteoarthritis, right hand: Secondary | ICD-10-CM | POA: Diagnosis not present

## 2024-05-12 DIAGNOSIS — Z79899 Other long term (current) drug therapy: Secondary | ICD-10-CM | POA: Diagnosis not present

## 2024-05-12 DIAGNOSIS — M19042 Primary osteoarthritis, left hand: Secondary | ICD-10-CM | POA: Diagnosis not present

## 2024-05-12 DIAGNOSIS — M0609 Rheumatoid arthritis without rheumatoid factor, multiple sites: Secondary | ICD-10-CM | POA: Diagnosis not present

## 2024-05-19 ENCOUNTER — Encounter: Payer: Self-pay | Admitting: Certified Nurse Midwife

## 2024-06-01 DIAGNOSIS — H2513 Age-related nuclear cataract, bilateral: Secondary | ICD-10-CM | POA: Diagnosis not present

## 2024-06-01 DIAGNOSIS — E119 Type 2 diabetes mellitus without complications: Secondary | ICD-10-CM | POA: Diagnosis not present

## 2024-06-01 LAB — HM DIABETES EYE EXAM

## 2024-06-07 ENCOUNTER — Telehealth: Payer: Self-pay | Admitting: Family Medicine

## 2024-06-07 MED ORDER — ATORVASTATIN CALCIUM 10 MG PO TABS: ORAL_TABLET | ORAL | 1 refills | Status: DC

## 2024-06-07 NOTE — Telephone Encounter (Signed)
 [facility] pharmacy is requesting refill atorvastatin  (LIPITOR) 10 MG tablet   Please advise

## 2024-06-10 ENCOUNTER — Other Ambulatory Visit (HOSPITAL_COMMUNITY): Payer: Self-pay

## 2024-06-17 ENCOUNTER — Telehealth: Payer: Self-pay

## 2024-06-17 MED ORDER — METFORMIN HCL ER 500 MG PO TB24: 1000.0000 mg | ORAL_TABLET | Freq: Two times a day (BID) | ORAL | 1 refills | Status: DC

## 2024-06-17 NOTE — Telephone Encounter (Signed)
 Spoke with pt, made aware medication has been sent to pharmacy.

## 2024-06-17 NOTE — Telephone Encounter (Unsigned)
 Copied from CRM [id number]. Topic: Clinical - Medication Question >> Jun 17, 2024 12:18 PM Fonda T wrote: Reason for CRM: Patient calling to check status of medication refill for metFORMIN  (GLUCOPHAGE -XR) 500 MG 24 hr tablet. Per patient states pharmacy sent a request on 06/10/24, And have not received a response.   Patient requesting a status update, as well as contacting pharmacy regarding refill request.  Patient can be reached at [phone].  Preferred pharmacy:   [id number] [place], [place] - [address] AT [facility] [address] [place] [address] Phone: [phone] Fax: [phone]

## 2024-07-06 ENCOUNTER — Telehealth: Payer: Self-pay | Admitting: Family Medicine

## 2024-07-06 MED ORDER — EMPAGLIFLOZIN 25 MG PO TABS: 25.0000 mg | ORAL_TABLET | Freq: Every day | ORAL | 1 refills | Status: DC

## 2024-07-06 NOTE — Telephone Encounter (Signed)
 [facility] faxed refill request for the following medications:   empagliflozin  (JARDIANCE ) 25 MG TABS tablet     Please advise.

## 2024-07-06 NOTE — Telephone Encounter (Signed)
 Refill provided

## 2024-07-26 ENCOUNTER — Ambulatory Visit: Payer: Self-pay

## 2024-07-26 NOTE — Telephone Encounter (Signed)
  FYI Only or Action Required?: FYI only for provider.  Patient was last seen in primary care on 03/25/2024 by Myrla Jon HERO, MD.  Called Nurse Triage reporting Rash.  Symptoms began yesterday.  Interventions attempted: Nothing.  Symptoms are: unchanged.  Triage Disposition: Home Care  Patient/caregiver understands and will follow disposition?: Yes             Copied from CRM #[id number]. Topic: Clinical - Red Word Triage >> Jul 26, 2024  2:27 PM Winona R wrote: Pt has a rash on her wrist that is red and swelling Reason for Disposition  Ringworm  Answer Assessment - Initial Assessment Questions 1. CALLER DIAGNOSIS: What do you think is causing the rash? (e.g., athlete's foot, chickenpox, hives, impetigo)     Rash to wrist, left wrist 2. LOCALIZED OR WIDESPREAD:  Is the rash all over (widespread) or mostly just in one area of the body (localized)?      Left wrist, states looks like ring worm 3. NEW MEDICINES: Are you taking any new medicine?     no 4. APPEARANCE of RASH: What does the rash look like? What color is it?  Note: It is difficult to assess rash color in people with darker-colored skin. When this situation occurs, simply ask the caller to describe what they see.     Red rash elevated 5. FEVER: Do you have a fever? If Yes, ask: What is your temperature, how was it measured, and when did it start?     no 6. PREGNANCY: Is there any chance you are pregnant? When was your last menstrual period?     na  Answer Assessment - Initial Assessment Questions 1. APPEARANCE of RASH: What does the rash look like?      Red raised rash to left wrist 2. LOCATION: Where is the rash located?      Left wrist 3. SIZE: How large are the spots?      1 spot 4. NUMBER: How many spots are there?      1 5. ONSET: When did the ringworm start?     yesterday 6. OTHER SYMPTOMS: Do you have any other symptoms? (e.g., fever, headache, etc.)      denies 7. PREGNANCY: Is there any chance you are pregnant? When was your last menstrual period?     na  Protocols used: Rash - Guideline Selection-A-AH, Ringworm-A-AH

## 2024-07-27 ENCOUNTER — Ambulatory Visit (INDEPENDENT_AMBULATORY_CARE_PROVIDER_SITE_OTHER): Admitting: Physician Assistant

## 2024-07-27 ENCOUNTER — Encounter: Payer: Self-pay | Admitting: Physician Assistant

## 2024-07-27 VITALS — BP 106/70 | HR 94 | Ht 67.0 in | Wt 191.5 lb

## 2024-07-27 DIAGNOSIS — Z7984 Long term (current) use of oral hypoglycemic drugs: Secondary | ICD-10-CM

## 2024-07-27 DIAGNOSIS — R21 Rash and other nonspecific skin eruption: Secondary | ICD-10-CM

## 2024-07-27 DIAGNOSIS — L409 Psoriasis, unspecified: Secondary | ICD-10-CM

## 2024-07-27 DIAGNOSIS — L308 Other specified dermatitis: Secondary | ICD-10-CM

## 2024-07-27 DIAGNOSIS — E1165 Type 2 diabetes mellitus with hyperglycemia: Secondary | ICD-10-CM | POA: Diagnosis not present

## 2024-07-27 DIAGNOSIS — M05741 Rheumatoid arthritis with rheumatoid factor of right hand without organ or systems involvement: Secondary | ICD-10-CM

## 2024-07-27 DIAGNOSIS — M05742 Rheumatoid arthritis with rheumatoid factor of left hand without organ or systems involvement: Secondary | ICD-10-CM

## 2024-07-27 MED ORDER — TRIAMCINOLONE ACETONIDE 0.5 % EX CREA: 1.0000 | TOPICAL_CREAM | Freq: Three times a day (TID) | CUTANEOUS | 0 refills | Status: AC

## 2024-07-27 MED ORDER — HYDROXYZINE PAMOATE 25 MG PO CAPS: 25.0000 mg | ORAL_CAPSULE | Freq: Three times a day (TID) | ORAL | 0 refills | Status: DC | PRN

## 2024-07-27 NOTE — Progress Notes (Signed)
 Established patient visit  Patient: Elizabeth [name]   DOB: 10-Jun-1966   58 y.o. Female  MRN: [id number] Visit Date: 07/27/2024  Today's healthcare provider: Jolynn Spencer, PA-C   Chief Complaint  Patient presents with   Rash    Patient is present due to red raised rash to left wrist present since Sunday. Associated with swelling. She denies any pain. She reports she thinks it is a ringworm. She reports using previous clotrimazole  rx and a mixture of coconut and tee tree oil to assist with itching   Subjective      Discussed the use of AI scribe software for clinical note transcription with the patient, who gave verbal consent to proceed.  History of Present Illness Elizabeth [name] is a 58 year old female with diabetes and rheumatoid arthritis who presents with a rash on her wrist.  The rash on her wrist, where her watch band was, resembles 'a pair of lips' and is intensely pruritic. Clotrimazole  1% cream applied two to three times daily has been ineffective, as have coconut oil mixed with tea tree oil and clotrimazole . Cold compresses provide some relief. The rash began after removing her watch due to itching. She has a history of eczema and psoriasis and recalls gabapentin  causing a breakout previously. She recently stopped methotrexate  three weeks ago, concerned about its interaction with a vaccine program she is participating in. Her diabetes is managed with metformin , Jardiance , and Ozempic , and she has modified her diet to manage blood sugar levels. She takes magnesium gummies and has switched from vitamin D3 to D3K2. No similar symptoms in family members.       04/28/2024    3:25 PM 03/25/2024    1:19 PM 10/07/2023    8:49 AM  Depression screen PHQ 2/9  Decreased Interest 0 0 0  Down, Depressed, Hopeless 0 0 0  PHQ - 2 Score 0 0 0  Altered sleeping 0  0  Tired, decreased energy 0  0  Change in appetite 0  0  Feeling bad or failure about yourself  0  0  Trouble concentrating 0   0  Moving slowly or fidgety/restless 0  0  Suicidal thoughts 0  0  PHQ-9 Score 0  0  Difficult doing work/chores Not difficult at all  Not difficult at all      03/25/2024    1:19 PM 10/07/2023    8:50 AM 02/19/2021    9:15 AM 12/24/2018   11:26 AM  GAD 7 : Generalized Anxiety Score  Nervous, Anxious, on Edge 0 0 2 0  Control/stop worrying 0 0 2 0  Worry too much - different things 0 0 3 0  Trouble relaxing 0 0 2 0  Restless 0 0 2 0  Easily annoyed or irritable 0 0 2 0  Afraid - awful might happen 0 0 2 0  Total GAD 7 Score 0 0 15 0  Anxiety Difficulty Not difficult at all Not difficult at all Somewhat difficult Not difficult at all    Medications: Outpatient Medications Prior to Visit  Medication Sig   Accu-Chek FastClix Lancets MISC USE TO CHECK BLOOD SUGAR TWICE DAILY   ammonium lactate  (AMLACTIN) 12 % lotion Apply 1 application. topically as needed for dry skin.   aspirin  81 MG tablet Take 81 mg by mouth daily.   atorvastatin  (LIPITOR) 10 MG tablet TAKE 1 TABLET(10 MG) BY MOUTH AT BEDTIME   B-D INS SYR ULTRAFINE 1CC/31G 31G X 5/16 1 ML MISC  Blood Glucose Monitoring Suppl (ACCU-CHEK GUIDE) w/Device KIT AS DIRECTED EVERY DAY   cholecalciferol (VITAMIN D ) 1000 units tablet Take 1,000 Units by mouth daily.   clotrimazole  (LOTRIMIN  AF) 1 % cream Apply 1 Application topically 2 (two) times daily.   empagliflozin  (JARDIANCE ) 25 MG TABS tablet Take 1 tablet (25 mg total) by mouth daily.   ferrous sulfate  324 (65 Fe) MG TBEC Take 1 tablet (325 mg total) by mouth daily.   fluticasone  (FLONASE ) 50 MCG/ACT nasal spray Place into both nostrils daily.   folic acid  (FOLVITE ) 1 MG tablet Take 1 mg by mouth daily.   glucose blood (ACCU-CHEK GUIDE) test strip USE TO CHECK BLOOD SUGARS AS DIRECTED BY DOCTORS TWICE DAILY   ipratropium (ATROVENT) 0.03 % nasal spray SMARTSIG:2 Spray(s) Both Nares 3 Times Daily PRN   linaclotide  (LINZESS ) 290 MCG CAPS capsule Take 1 capsule (290 mcg total) by  mouth daily before breakfast.   melatonin 5 MG TABS Take 5 mg by mouth at bedtime as needed.   metFORMIN  (GLUCOPHAGE -XR) 500 MG 24 hr tablet Take 2 tablets (1,000 mg total) by mouth 2 (two) times daily with a meal.   Methotrexate  Sodium (METHOTREXATE , PF,) 50 MG/2ML injection 15 mg once a week.   Multiple Vitamins-Minerals (MULTIVITAMIN/EXTRA VITAMIN D3 PO) Take by mouth.   Omega-3 Fatty Acids (FISH OIL PO) Take by mouth.   OVER THE COUNTER MEDICATION once daily Herbal Name: Goli   Semaglutide , 2 MG/DOSE, 8 MG/3ML SOPN Inject 2 mg as directed once a week.   traZODone  (DESYREL ) 50 MG tablet Take 25 mg by mouth at bedtime.   Vitamin A 2400 MCG (8000 UT) TABS Take by mouth.   vitamin B-12 (CYANOCOBALAMIN) 1000 MCG tablet Take 1,000 mcg by mouth daily.   azelastine  (ASTELIN ) 0.1 % nasal spray Place 2 sprays into both nostrils 2 (two) times daily. Use in each nostril as directed (Patient not taking: Reported on 07/27/2024)   Moringa Oleifera (MORINGA PO) Take by mouth daily. 2 TABS AT NIGHT (Patient not taking: Reported on 07/27/2024)   Zinc 22.5 MG TABS Take by mouth. (Patient not taking: Reported on 07/27/2024)   No facility-administered medications prior to visit.    Review of Systems All negative Except see HPI       Objective    BP 106/70 (BP Location: Right Arm, Patient Position: Sitting, Cuff Size: Large)   Pulse 94   Ht 5' 7 (1.702 m)   Wt 191 lb 8 oz (86.9 kg)   LMP 07/09/2014   SpO2 100%   BMI 29.99 kg/m     Physical Exam Skin:    Findings: Lesion and rash present.      No results found for any visits on 07/27/24.      Assessment & Plan Rash/pruritic Contact dermatitis of wrist (likely from watch band) Rash likely contact dermatitis due to allergic reaction to watch band. Non-responsive to clotrimazole  or antifungal treatments. Presentation consistent with contact dermatitis rather than ringworm. - Prescribe strong topical steroid for wrist. - Recommend cold  compresses to reduce itching and inflammation. - Advise covering rash to prevent irritation and scratching. - Refer to dermatology for further evaluation and management.  Psoriasis Psoriasis may contribute to wrist rash. Discontinuation of methotrexate  may have caused flare-up. - Advise resuming methotrexate  after consulting rheumatologist. - Refer to dermatology for further evaluation and management.  Type 2 diabetes mellitus Managed with metformin , Jardiance , and Ozempic . Last A1c 6.2% six months ago. Good control reported but not regularly checked.  Diabetes affects healing and immune response. - Continue metformin , Jardiance , and Ozempic . - Advise regular blood sugar monitoring. - Discuss importance of diabetes control to prevent complications.  Rheumatoid arthritis Not managed with methotrexate  due to vaccine program participation. Discontinuation may exacerbate psoriasis and symptoms. Methotrexate  important for autoimmune management. - Advise consulting rheumatologist regarding methotrexate  resumption. - Discuss importance of managing rheumatoid arthritis to prevent exacerbation of psoriasis and other autoimmune conditions.  1. Rash (Primary)  - triamcinolone  cream (KENALOG ) 0.5 %; Apply 1 Application topically 3 (three) times daily.  Dispense: 30 g; Refill: 0 - hydrOXYzine  (VISTARIL ) 25 MG capsule; Take 1 capsule (25 mg total) by mouth every 8 (eight) hours as needed.  Dispense: 30 capsule; Refill: 0  2. Pruritic dermatitis  - triamcinolone  cream (KENALOG ) 0.5 %; Apply 1 Application topically 3 (three) times daily.  Dispense: 30 g; Refill: 0 - hydrOXYzine  (VISTARIL ) 25 MG capsule; Take 1 capsule (25 mg total) by mouth every 8 (eight) hours as needed.  Dispense: 30 capsule; Refill: 0  3. Type 2 diabetes mellitus with hyperglycemia, without long-term current use of insulin  (HCC) Pt declined labs   No orders of the defined types were placed in this encounter.   No follow-ups on  file.   The patient was advised to call back or seek an in-person evaluation if the symptoms worsen or if the condition fails to improve as anticipated.  I discussed the assessment and treatment plan with the patient. The patient was provided an opportunity to ask questions and all were answered. The patient agreed with the plan and demonstrated an understanding of the instructions.  I, Joslin Doell, PA-C have reviewed all documentation for this visit. The documentation on 07/27/2024  for the exam, diagnosis, procedures, and orders are all accurate and complete.  Jolynn Spencer, [facility], MMS [facility] [phone] (phone) [phone] (fax)  [facility]

## 2024-09-16 DIAGNOSIS — M0609 Rheumatoid arthritis without rheumatoid factor, multiple sites: Secondary | ICD-10-CM | POA: Diagnosis not present

## 2024-09-16 DIAGNOSIS — Z79899 Other long term (current) drug therapy: Secondary | ICD-10-CM | POA: Diagnosis not present

## 2024-09-27 ENCOUNTER — Telehealth: Payer: Self-pay | Admitting: Family Medicine

## 2024-09-27 NOTE — Telephone Encounter (Signed)
 [facility] faxed refill request for the following medications:  linaclotide  (LINZESS ) 290 MCG CAPS capsule    Please advise.

## 2024-09-28 ENCOUNTER — Ambulatory Visit: Admitting: Family Medicine

## 2024-09-28 ENCOUNTER — Other Ambulatory Visit: Payer: Self-pay

## 2024-09-28 VITALS — BP 124/80 | HR 86 | Ht 66.5 in | Wt 195.2 lb

## 2024-09-28 DIAGNOSIS — Z79899 Other long term (current) drug therapy: Secondary | ICD-10-CM | POA: Diagnosis not present

## 2024-09-28 DIAGNOSIS — Z7985 Long-term (current) use of injectable non-insulin antidiabetic drugs: Secondary | ICD-10-CM

## 2024-09-28 DIAGNOSIS — Z7984 Long term (current) use of oral hypoglycemic drugs: Secondary | ICD-10-CM | POA: Diagnosis not present

## 2024-09-28 DIAGNOSIS — E785 Hyperlipidemia, unspecified: Secondary | ICD-10-CM | POA: Diagnosis not present

## 2024-09-28 DIAGNOSIS — E1169 Type 2 diabetes mellitus with other specified complication: Secondary | ICD-10-CM

## 2024-09-28 DIAGNOSIS — E1159 Type 2 diabetes mellitus with other circulatory complications: Secondary | ICD-10-CM | POA: Diagnosis not present

## 2024-09-28 DIAGNOSIS — I152 Hypertension secondary to endocrine disorders: Secondary | ICD-10-CM

## 2024-09-28 DIAGNOSIS — F319 Bipolar disorder, unspecified: Secondary | ICD-10-CM

## 2024-09-28 DIAGNOSIS — E1165 Type 2 diabetes mellitus with hyperglycemia: Secondary | ICD-10-CM | POA: Diagnosis not present

## 2024-09-28 MED ORDER — LINACLOTIDE 290 MCG PO CAPS: 290.0000 ug | ORAL_CAPSULE | Freq: Every day | ORAL | 1 refills | Status: AC

## 2024-09-28 NOTE — Assessment & Plan Note (Signed)
 Reports tingling and numbness in both big toes, suggestive of diabetic neuropathy. Elevated A1c may contribute to neuropathy. Current medications include metformin , Ozempic , and Jardiance . Concerns about metformin 's side effects were addressed, confirming its safety for her. B12 levels will be checked due to potential depletion from metformin , which can cause neuropathy and brain fog. - Ordered A1c test - Ordered urine test for protein level - Performed foot exam - Checked B12 level - Continue metformin  1000 mg twice daily - Continue Ozempic  2 mg once weekly - Continue Jardiance  25 mg daily

## 2024-09-28 NOTE — Assessment & Plan Note (Signed)
Continue atorvastatin at current dose

## 2024-09-28 NOTE — Telephone Encounter (Signed)
 Converted

## 2024-09-28 NOTE — Telephone Encounter (Signed)
 LOV 07/27/24 NOV Today LRF 01/09/24 90 x 1

## 2024-09-28 NOTE — Progress Notes (Signed)
 Established patient visit   Patient: Elizabeth [name]   DOB: Apr 07, 1966   58 y.o. Female  MRN: [id number] Visit Date: 09/28/2024  Today's healthcare provider: Jon Eva, MD   Chief Complaint  Patient presents with   Medical Management of Chronic Issues   Diabetes   Subjective    Diabetes     Discussed the use of AI scribe software for clinical note transcription with the patient, who gave verbal consent to proceed.  History of Present Illness   Elizabeth [name] is a 58 year old female with diabetes who presents for follow-up on her diabetes management and A1c levels.  She experiences persistent tingling sensations in both big toes, described as feeling like 'needles'. She is currently taking metformin  1000 mg twice a day, Ozempic  2 mg once a week, and Jardiance  25 mg daily. She also takes atorvastatin  for cholesterol management. She has concerns about the negativity surrounding metformin  but has not experienced any issues herself.  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 She is actively involved in her community and maintains a busy lifestyle. She has received a flu shot and a COVID-19 booster recently. She also takes vitamin D  and K supplements.       Medications: Outpatient Medications Prior to Visit  Medication Sig   Accu-Chek FastClix Lancets MISC USE TO CHECK BLOOD SUGAR TWICE DAILY   ammonium lactate  (AMLACTIN) 12 % lotion Apply 1 application. topically as needed for dry skin.   aspirin  81 MG tablet Take 81 mg by mouth daily.   atorvastatin  (LIPITOR) 10 MG tablet TAKE 1 TABLET(10 MG) BY MOUTH AT BEDTIME   azelastine  (ASTELIN ) 0.1 % nasal spray Place 2 sprays into both nostrils 2 (two) times daily. Use in each nostril as directed   B-D INS SYR ULTRAFINE 1CC/31G 31G X 5/16 1 ML MISC    Blood Glucose Monitoring Suppl (ACCU-CHEK GUIDE) w/Device KIT AS DIRECTED EVERY DAY    cholecalciferol (VITAMIN D ) 1000 units tablet Take 1,000 Units by mouth daily.   clotrimazole  (LOTRIMIN  AF) 1 % cream Apply 1 Application topically 2 (two) times daily.   empagliflozin  (JARDIANCE ) 25 MG TABS tablet Take 1 tablet (25 mg total) by mouth daily.   ferrous sulfate  324 (65 Fe) MG TBEC Take 1 tablet (325 mg total) by mouth daily.   fluticasone  (FLONASE ) 50 MCG/ACT nasal spray Place into both nostrils daily.   folic acid  (FOLVITE ) 1 MG tablet Take 1 mg by mouth daily.   glucose blood (ACCU-CHEK GUIDE) test strip USE TO CHECK BLOOD SUGARS AS DIRECTED BY DOCTORS TWICE DAILY   ipratropium (ATROVENT) 0.03 % nasal spray SMARTSIG:2 Spray(s) Both Nares 3 Times Daily PRN   linaclotide  (LINZESS ) 290 MCG CAPS capsule Take 1 capsule (290 mcg total) by mouth daily before breakfast.   melatonin 5 MG TABS Take 5 mg by mouth at bedtime as needed.   metFORMIN  (GLUCOPHAGE -XR) 500 MG 24 hr tablet Take 2 tablets (1,000 mg total) by mouth 2 (two) times daily with a meal.   Methotrexate  Sodium (METHOTREXATE , PF,) 50 MG/2ML injection 15 mg once a week.   Moringa Oleifera (MORINGA PO) Take by mouth daily. 2 TABS AT NIGHT   Multiple Vitamins-Minerals (MULTIVITAMIN/EXTRA VITAMIN D3 PO) Take by mouth.   Omega-3 Fatty Acids (FISH OIL PO) Take by mouth.   OVER THE COUNTER MEDICATION once daily Herbal Name: Goli   Semaglutide , 2 MG/DOSE,  8 MG/3ML SOPN Inject 2 mg as directed once a week.   traZODone  (DESYREL ) 50 MG tablet Take 25 mg by mouth at bedtime.   triamcinolone  cream (KENALOG ) 0.5 % Apply 1 Application topically 3 (three) times daily.   Vitamin A 2400 MCG (8000 UT) TABS Take by mouth.   vitamin B-12 (CYANOCOBALAMIN) 1000 MCG tablet Take 1,000 mcg by mouth daily.   [DISCONTINUED] hydrOXYzine  (VISTARIL ) 25 MG capsule Take 1 capsule (25 mg total) by mouth every 8 (eight) hours as needed. (Patient not taking: Reported on 09/28/2024)   [DISCONTINUED] Zinc 22.5 MG TABS Take by mouth. (Patient not taking:  Reported on 09/28/2024)   No facility-administered medications prior to visit.    Review of Systems     Objective    BP 124/80 (BP Location: Left Arm, Patient Position: Sitting, Cuff Size: Large)   Pulse 86   Ht 5' 6.5 (1.689 m)   Wt 195 lb 3.2 oz (88.5 kg)   LMP 07/09/2014   SpO2 98%   BMI 31.03 kg/m    Physical Exam Vitals reviewed.  Constitutional:      General: She is not in acute distress.    Appearance: Normal appearance. She is well-developed. She is not diaphoretic.  HENT:     Head: Normocephalic and atraumatic.  Eyes:     General: No scleral icterus.    Conjunctiva/sclera: Conjunctivae normal.  Neck:     Thyroid : No thyromegaly.  Cardiovascular:     Rate and Rhythm: Normal rate and regular rhythm.     Heart sounds: Normal heart sounds. No murmur heard. Pulmonary:     Effort: Pulmonary effort is normal. No respiratory distress.     Breath sounds: Normal breath sounds. No wheezing, rhonchi or rales.  Musculoskeletal:     Cervical back: Neck supple.     Right lower leg: No edema.     Left lower leg: No edema.  Lymphadenopathy:     Cervical: No cervical adenopathy.  Skin:    General: Skin is warm and dry.     Findings: No rash.  Neurological:     Mental Status: She is alert and oriented to person, place, and time. Mental status is at baseline.  Psychiatric:        Mood and Affect: Mood normal.        Behavior: Behavior normal.    Diabetic Foot Exam - Simple   Simple Foot Form Diabetic Foot exam was performed with the following findings: Yes 09/28/2024  8:52 AM  Visual Inspection No deformities, no ulcerations, no other skin breakdown bilaterally: Yes Sensation Testing Intact to touch and monofilament testing bilaterally: Yes Pulse Check Posterior Tibialis and Dorsalis pulse intact bilaterally: Yes Comments      No results found for any visits on 09/28/24.  Assessment & Plan     Problem List Items Addressed This Visit       Cardiovascular  and Mediastinum   Hypertension associated with diabetes (HCC) - Primary   Well controlled Continue current meds      Relevant Orders   Lipid panel     Endocrine   Type 2 diabetes mellitus with hyperglycemia, without long-term current use of insulin  (HCC)   Reports tingling and numbness in both big toes, suggestive of diabetic neuropathy. Elevated A1c may contribute to neuropathy. Current medications include metformin , Ozempic , and Jardiance . Concerns about metformin 's side effects were addressed, confirming its safety for her. B12 levels will be checked due to potential depletion from metformin , which can  cause neuropathy and brain fog. - Ordered A1c test - Ordered urine test for protein level - Performed foot exam - Checked B12 level - Continue metformin  1000 mg twice daily - Continue Ozempic  2 mg once weekly - Continue Jardiance  25 mg daily      Relevant Orders   Hemoglobin A1c   Microalbumin / creatinine urine ratio   Hyperlipidemia associated with type 2 diabetes mellitus (HCC)   Continue atorvastatin  at current dose      Relevant Orders   Comprehensive metabolic panel with GFR   Lipid panel     Other   Bipolar 1 disorder (HCC)   History of bipolar 1 disorder with history of mania No depression or mania for many years She is not currently on a mood stabilizer Continue therapy      Other Visit Diagnoses       Long-term use of high-risk medication       Relevant Orders   B12          General Health Maintenance Up to date on most screenings and vaccinations. Received flu and COVID vaccines. Hepatitis B vaccine was administered due to occupational exposure. Mammogram and colonoscopy are current. - Ensure vaccinations are up to date - Continue routine health maintenance screenings       Return in about 6 months (around 03/29/2025) for CPE.       Jon Eva, MD  [facility] [phone] (phone) [phone] (fax)  [facility]

## 2024-09-28 NOTE — Assessment & Plan Note (Signed)
 Well controlled Continue current meds

## 2024-09-28 NOTE — Assessment & Plan Note (Signed)
History of bipolar 1 disorder with history of mania No depression or mania for many years She is not currently on a mood stabilizer Continue therapy

## 2024-09-29 LAB — LIPID PANEL
Chol/HDL Ratio: 3.1 ratio (ref 0.0–4.4)
Cholesterol, Total: 153 mg/dL (ref 100–199)
HDL: 49 mg/dL (ref >39)
LDL Chol Calc (NIH): 76 mg/dL (ref 0–99)
Triglycerides: 166 mg/dL — ABNORMAL HIGH (ref 0–149)
VLDL Cholesterol Cal: 28 mg/dL (ref 5–40)

## 2024-09-29 LAB — COMPREHENSIVE METABOLIC PANEL WITH GFR
ALT: 25 IU/L (ref 0–32)
AST: 21 IU/L (ref 0–40)
Albumin: 3.9 g/dL (ref 3.8–4.9)
Alkaline Phosphatase: 81 IU/L (ref 49–135)
BUN/Creatinine Ratio: 13 (ref 9–23)
BUN: 10 mg/dL (ref 6–24)
Bilirubin Total: 0.3 mg/dL (ref 0.0–1.2)
CO2: 22 mmol/L (ref 20–29)
Calcium: 9.8 mg/dL (ref 8.7–10.2)
Chloride: 106 mmol/L (ref 96–106)
Creatinine, Ser: 0.79 mg/dL (ref 0.57–1.00)
Globulin, Total: 2.3 g/dL (ref 1.5–4.5)
Glucose: 100 mg/dL — ABNORMAL HIGH (ref 70–99)
Potassium: 4.8 mmol/L (ref 3.5–5.2)
Sodium: 141 mmol/L (ref 134–144)
Total Protein: 6.2 g/dL (ref 6.0–8.5)
eGFR: 87 mL/min/1.73 (ref >59)

## 2024-09-29 LAB — MICROALBUMIN / CREATININE URINE RATIO
Creatinine, Urine: 94.7 mg/dL
Microalb/Creat Ratio: <3 mg/g{creat} (ref 0–29)
Microalbumin, Urine: <3 ug/mL

## 2024-09-29 LAB — HEMOGLOBIN A1C
Est. average glucose Bld gHb Est-mCnc: 120 mg/dL
Hgb A1c MFr Bld: 5.8 % — ABNORMAL HIGH (ref 4.8–5.6)

## 2024-09-29 LAB — VITAMIN B12: Vitamin B-12: 1892 pg/mL — ABNORMAL HIGH (ref 232–1245)

## 2024-10-01 ENCOUNTER — Ambulatory Visit: Payer: Self-pay | Admitting: Family Medicine

## 2024-10-04 ENCOUNTER — Ambulatory Visit: Admitting: Family Medicine

## 2024-10-04 ENCOUNTER — Other Ambulatory Visit: Payer: Self-pay | Admitting: Family Medicine

## 2024-10-04 DIAGNOSIS — M79644 Pain in right finger(s): Secondary | ICD-10-CM

## 2024-11-08 ENCOUNTER — Telehealth: Payer: Self-pay | Admitting: Family Medicine

## 2024-11-08 ENCOUNTER — Telehealth: Payer: Self-pay

## 2024-11-08 NOTE — Telephone Encounter (Signed)
 Converted to rx request

## 2024-11-08 NOTE — Telephone Encounter (Signed)
 LOV- 09/28/2024 NOV- 04/04/2025 LRF- 06/11/2023 Outpatient Medication Detail   Disp Refills Start End   glucose blood (ACCU-CHEK GUIDE) test strip 100 strip 3 06/11/2023 --   Sig: USE TO CHECK BLOOD SUGARS AS DIRECTED BY DOCTORS TWICE DAILY   Sent to pharmacy as: glucose blood (ACCU-CHEK GUIDE) test strip   E-Prescribing Status: Receipt confirmed by pharmacy (06/11/2023  8:43 AM EDT)    I actually tried ordering these but it stated that there needed to be a new order.

## 2024-11-08 NOTE — Telephone Encounter (Signed)
 [facility] faxed refill request for the following medications:  Accu-check guide test strips 50   Please advise.

## 2024-11-09 ENCOUNTER — Other Ambulatory Visit: Payer: Self-pay

## 2024-11-09 ENCOUNTER — Telehealth: Payer: Self-pay | Admitting: Family Medicine

## 2024-11-09 DIAGNOSIS — E1165 Type 2 diabetes mellitus with hyperglycemia: Secondary | ICD-10-CM

## 2024-11-09 MED ORDER — ACCU-CHEK GUIDE TEST VI STRP: ORAL_STRIP | 12 refills | Status: AC

## 2024-11-09 MED ORDER — BLOOD GLUCOSE TEST STRIPS 333 VI STRP: ORAL_STRIP | 2 refills | Status: AC

## 2024-11-09 NOTE — Telephone Encounter (Signed)
 RX refill request for: ACCU-CHECK Guide Test strips 50

## 2024-11-09 NOTE — Telephone Encounter (Signed)
 Ok to order as requested. Do not see refill request sent

## 2024-11-09 NOTE — Addendum Note (Signed)
 Addended by: CHERRY CHIQUITA HERO on: 11/09/2024 11:52 AM   Modules accepted: Orders

## 2024-11-09 NOTE — Telephone Encounter (Signed)
 Prescription sent in

## 2024-11-29 ENCOUNTER — Telehealth: Payer: Self-pay | Admitting: Family Medicine

## 2024-11-29 MED ORDER — ATORVASTATIN CALCIUM 10 MG PO TABS: ORAL_TABLET | ORAL | 1 refills | Status: AC

## 2024-11-29 NOTE — Telephone Encounter (Signed)
[facility] faxed refill request for the following medications:  atorvastatin (LIPITOR) 10 MG tablet   Please advise.  

## 2024-12-03 ENCOUNTER — Telehealth: Payer: Self-pay | Admitting: Family Medicine

## 2024-12-03 NOTE — Telephone Encounter (Signed)
 Refill Request   Pharmacy: [facility]  Medication: metFORMIN  (GLUCOPHAGE -XR) 500 MG 24 hr tablet   Quantity (if provided): 360  Please send in refill request.

## 2024-12-06 ENCOUNTER — Telehealth: Payer: Self-pay | Admitting: Family Medicine

## 2024-12-06 MED ORDER — METFORMIN HCL ER 500 MG PO TB24: 1000.0000 mg | ORAL_TABLET | Freq: Two times a day (BID) | ORAL | 0 refills | Status: AC

## 2024-12-06 NOTE — Telephone Encounter (Signed)
 [facility] faxed refill request for the following medications:  metFORMIN (GLUCOPHAGE-XR) 500 MG 24 hr tablet   Please advise.

## 2024-12-06 NOTE — Telephone Encounter (Signed)
 Medication has been sent in today.

## 2024-12-14 ENCOUNTER — Telehealth: Payer: Self-pay | Admitting: Family Medicine

## 2024-12-14 NOTE — Telephone Encounter (Signed)
 [facility] faxed refill request for the following medications:  ferrous sulfate  324 (65 Fe) MG TBEC     Please advise.

## 2024-12-15 ENCOUNTER — Other Ambulatory Visit: Payer: Self-pay

## 2024-12-15 DIAGNOSIS — E611 Iron deficiency: Secondary | ICD-10-CM

## 2024-12-15 MED ORDER — FERROUS SULFATE 324 (65 FE) MG PO TBEC: 1.0000 | DELAYED_RELEASE_TABLET | Freq: Every day | ORAL | 1 refills | Status: AC

## 2024-12-15 NOTE — Telephone Encounter (Signed)
 Medication has been sent in

## 2024-12-20 ENCOUNTER — Ambulatory Visit: Admitting: Family Medicine

## 2025-01-05 ENCOUNTER — Other Ambulatory Visit: Payer: Self-pay | Admitting: Family Medicine

## 2025-04-04 ENCOUNTER — Encounter: Admitting: Family Medicine

## 2025-05-10 ENCOUNTER — Ambulatory Visit
